# Patient Record
Sex: Female | Born: 1945
Health system: Southern US, Community
[De-identification: ages and names within clinical notes are randomized; demographics above are authoritative.]

## PROBLEM LIST (undated history)

## (undated) DIAGNOSIS — H3530 Unspecified macular degeneration: Secondary | ICD-10-CM

## (undated) DIAGNOSIS — I4891 Unspecified atrial fibrillation: Secondary | ICD-10-CM

## (undated) DIAGNOSIS — R011 Cardiac murmur, unspecified: Secondary | ICD-10-CM

## (undated) DIAGNOSIS — R591 Generalized enlarged lymph nodes: Secondary | ICD-10-CM

## (undated) DIAGNOSIS — C9110 Chronic lymphocytic leukemia of B-cell type not having achieved remission: Secondary | ICD-10-CM

## (undated) DIAGNOSIS — D72820 Lymphocytosis (symptomatic): Secondary | ICD-10-CM

## (undated) DIAGNOSIS — I10 Essential (primary) hypertension: Secondary | ICD-10-CM

## (undated) DIAGNOSIS — M1990 Unspecified osteoarthritis, unspecified site: Secondary | ICD-10-CM

## (undated) DIAGNOSIS — I499 Cardiac arrhythmia, unspecified: Secondary | ICD-10-CM

## (undated) DIAGNOSIS — Z9889 Other specified postprocedural states: Secondary | ICD-10-CM

## (undated) DIAGNOSIS — H35039 Hypertensive retinopathy, unspecified eye: Secondary | ICD-10-CM

## (undated) DIAGNOSIS — E785 Hyperlipidemia, unspecified: Secondary | ICD-10-CM

## (undated) DIAGNOSIS — R112 Nausea with vomiting, unspecified: Secondary | ICD-10-CM

## (undated) DIAGNOSIS — R599 Enlarged lymph nodes, unspecified: Secondary | ICD-10-CM

## (undated) HISTORY — DX: Generalized enlarged lymph nodes: R59.1

## (undated) HISTORY — DX: Hyperlipidemia, unspecified: E78.5

## (undated) HISTORY — DX: Enlarged lymph nodes, unspecified: R59.9

## (undated) HISTORY — PX: CHOLECYSTECTOMY: SHX55

## (undated) HISTORY — DX: Unspecified atrial fibrillation: I48.91

## (undated) HISTORY — PX: ABDOMINAL HYSTERECTOMY: SUR658

## (undated) HISTORY — PX: APPENDECTOMY: SHX54

## (undated) HISTORY — DX: Hypertensive retinopathy, unspecified eye: H35.039

## (undated) HISTORY — DX: Essential (primary) hypertension: I10

## (undated) HISTORY — PX: BREAST BIOPSY: SHX20

## (undated) HISTORY — PX: EYE SURGERY: SHX253

## (undated) HISTORY — DX: Unspecified macular degeneration: H35.30

## (undated) HISTORY — DX: Lymphocytosis (symptomatic): D72.820

## (undated) HISTORY — DX: Chronic lymphocytic leukemia of B-cell type not having achieved remission: C91.10

## (undated) HISTORY — PX: THUMB ARTHROSCOPY: SHX2509

---

## 1999-01-02 ENCOUNTER — Ambulatory Visit (HOSPITAL_COMMUNITY): Admission: RE | Admit: 1999-01-02 | Discharge: 1999-01-02 | Payer: Self-pay | Admitting: Gastroenterology

## 1999-03-14 ENCOUNTER — Other Ambulatory Visit: Admission: RE | Admit: 1999-03-14 | Discharge: 1999-03-14 | Payer: Self-pay | Admitting: Gynecology

## 2000-04-03 ENCOUNTER — Other Ambulatory Visit: Admission: RE | Admit: 2000-04-03 | Discharge: 2000-04-03 | Payer: Self-pay | Admitting: Gynecology

## 2000-07-10 ENCOUNTER — Encounter: Payer: Self-pay | Admitting: Family Medicine

## 2000-07-10 ENCOUNTER — Encounter: Admission: RE | Admit: 2000-07-10 | Discharge: 2000-07-10 | Payer: Self-pay | Admitting: Family Medicine

## 2001-05-18 ENCOUNTER — Other Ambulatory Visit: Admission: RE | Admit: 2001-05-18 | Discharge: 2001-05-18 | Payer: Self-pay | Admitting: Gynecology

## 2001-08-06 ENCOUNTER — Encounter: Payer: Self-pay | Admitting: Gynecology

## 2001-08-06 ENCOUNTER — Encounter: Admission: RE | Admit: 2001-08-06 | Discharge: 2001-08-06 | Payer: Self-pay | Admitting: Gynecology

## 2002-06-21 ENCOUNTER — Other Ambulatory Visit: Admission: RE | Admit: 2002-06-21 | Discharge: 2002-06-21 | Payer: Self-pay | Admitting: Gynecology

## 2002-09-02 ENCOUNTER — Encounter (INDEPENDENT_AMBULATORY_CARE_PROVIDER_SITE_OTHER): Payer: Self-pay | Admitting: *Deleted

## 2002-09-02 ENCOUNTER — Ambulatory Visit (HOSPITAL_COMMUNITY): Admission: RE | Admit: 2002-09-02 | Discharge: 2002-09-02 | Payer: Self-pay | Admitting: Gastroenterology

## 2003-05-31 ENCOUNTER — Encounter: Payer: Self-pay | Admitting: Orthopedic Surgery

## 2003-06-02 ENCOUNTER — Ambulatory Visit (HOSPITAL_COMMUNITY): Admission: RE | Admit: 2003-06-02 | Discharge: 2003-06-02 | Payer: Self-pay | Admitting: Orthopedic Surgery

## 2003-07-27 ENCOUNTER — Other Ambulatory Visit: Admission: RE | Admit: 2003-07-27 | Discharge: 2003-07-27 | Payer: Self-pay | Admitting: Gynecology

## 2003-07-27 ENCOUNTER — Encounter: Payer: Self-pay | Admitting: Gynecology

## 2003-07-27 ENCOUNTER — Encounter: Admission: RE | Admit: 2003-07-27 | Discharge: 2003-07-27 | Payer: Self-pay | Admitting: Gynecology

## 2004-08-02 ENCOUNTER — Ambulatory Visit (HOSPITAL_COMMUNITY): Admission: RE | Admit: 2004-08-02 | Discharge: 2004-08-02 | Payer: Self-pay | Admitting: Gynecology

## 2004-08-08 ENCOUNTER — Other Ambulatory Visit: Admission: RE | Admit: 2004-08-08 | Discharge: 2004-08-08 | Payer: Self-pay | Admitting: Gynecology

## 2005-09-04 ENCOUNTER — Ambulatory Visit (HOSPITAL_COMMUNITY): Admission: RE | Admit: 2005-09-04 | Discharge: 2005-09-04 | Payer: Self-pay | Admitting: Gynecology

## 2005-09-18 ENCOUNTER — Other Ambulatory Visit: Admission: RE | Admit: 2005-09-18 | Discharge: 2005-09-18 | Payer: Self-pay | Admitting: Gynecology

## 2006-10-09 ENCOUNTER — Ambulatory Visit (HOSPITAL_COMMUNITY): Admission: RE | Admit: 2006-10-09 | Discharge: 2006-10-09 | Payer: Self-pay | Admitting: Gynecology

## 2007-02-26 ENCOUNTER — Other Ambulatory Visit: Admission: RE | Admit: 2007-02-26 | Discharge: 2007-02-26 | Payer: Self-pay | Admitting: Gynecology

## 2007-06-04 ENCOUNTER — Encounter
Admission: RE | Admit: 2007-06-04 | Discharge: 2007-06-04 | Payer: Self-pay | Admitting: Physical Medicine and Rehabilitation

## 2007-10-01 ENCOUNTER — Ambulatory Visit (HOSPITAL_COMMUNITY): Admission: RE | Admit: 2007-10-01 | Discharge: 2007-10-01 | Payer: Self-pay | Admitting: Gastroenterology

## 2007-10-09 ENCOUNTER — Ambulatory Visit (HOSPITAL_COMMUNITY): Admission: RE | Admit: 2007-10-09 | Discharge: 2007-10-09 | Payer: Self-pay | Admitting: Gynecology

## 2008-04-28 ENCOUNTER — Ambulatory Visit (HOSPITAL_COMMUNITY): Admission: RE | Admit: 2008-04-28 | Discharge: 2008-04-28 | Payer: Self-pay | Admitting: Family Medicine

## 2008-09-21 ENCOUNTER — Encounter: Admission: RE | Admit: 2008-09-21 | Discharge: 2008-09-21 | Payer: Self-pay | Admitting: Orthopedic Surgery

## 2008-11-11 HISTORY — PX: TOTAL HIP ARTHROPLASTY: SHX124

## 2008-12-22 ENCOUNTER — Encounter: Admission: RE | Admit: 2008-12-22 | Discharge: 2008-12-22 | Payer: Self-pay | Admitting: Orthopedic Surgery

## 2009-01-01 ENCOUNTER — Ambulatory Visit: Payer: Self-pay | Admitting: *Deleted

## 2009-01-01 ENCOUNTER — Inpatient Hospital Stay (HOSPITAL_COMMUNITY): Admission: EM | Admit: 2009-01-01 | Discharge: 2009-01-02 | Payer: Self-pay | Admitting: Emergency Medicine

## 2009-01-02 ENCOUNTER — Encounter (INDEPENDENT_AMBULATORY_CARE_PROVIDER_SITE_OTHER): Payer: Self-pay | Admitting: Cardiology

## 2009-02-02 ENCOUNTER — Other Ambulatory Visit: Admission: RE | Admit: 2009-02-02 | Discharge: 2009-02-02 | Payer: Self-pay | Admitting: Family Medicine

## 2009-03-09 ENCOUNTER — Ambulatory Visit (HOSPITAL_COMMUNITY): Admission: RE | Admit: 2009-03-09 | Discharge: 2009-03-09 | Payer: Self-pay | Admitting: Family Medicine

## 2009-03-22 ENCOUNTER — Inpatient Hospital Stay (HOSPITAL_COMMUNITY): Admission: RE | Admit: 2009-03-22 | Discharge: 2009-03-25 | Payer: Self-pay | Admitting: Orthopedic Surgery

## 2010-04-03 ENCOUNTER — Ambulatory Visit (HOSPITAL_COMMUNITY): Admission: RE | Admit: 2010-04-03 | Discharge: 2010-04-03 | Payer: Self-pay | Admitting: Family Medicine

## 2011-02-19 LAB — CBC
HCT: 26.7 % — ABNORMAL LOW (ref 36.0–46.0)
HCT: 37.8 % (ref 36.0–46.0)
Hemoglobin: 9.1 g/dL — ABNORMAL LOW (ref 12.0–15.0)
Hemoglobin: 9.6 g/dL — ABNORMAL LOW (ref 12.0–15.0)
MCHC: 34.1 g/dL (ref 30.0–36.0)
MCHC: 35 g/dL (ref 30.0–36.0)
MCV: 83.3 fL (ref 78.0–100.0)
MCV: 84.3 fL (ref 78.0–100.0)
Platelets: 228 10*3/uL (ref 150–400)
Platelets: 230 10*3/uL (ref 150–400)
Platelets: 253 10*3/uL (ref 150–400)
RBC: 3.2 MIL/uL — ABNORMAL LOW (ref 3.87–5.11)
RBC: 3.4 MIL/uL — ABNORMAL LOW (ref 3.87–5.11)
RDW: 13.9 % (ref 11.5–15.5)
RDW: 14.2 % (ref 11.5–15.5)
WBC: 10.9 10*3/uL — ABNORMAL HIGH (ref 4.0–10.5)
WBC: 17.6 10*3/uL — ABNORMAL HIGH (ref 4.0–10.5)
WBC: 18.1 10*3/uL — ABNORMAL HIGH (ref 4.0–10.5)

## 2011-02-19 LAB — URINALYSIS, ROUTINE W REFLEX MICROSCOPIC
Bilirubin Urine: NEGATIVE
Glucose, UA: NEGATIVE mg/dL
Hgb urine dipstick: NEGATIVE
Specific Gravity, Urine: 1.007 (ref 1.005–1.030)
Urobilinogen, UA: 0.2 mg/dL (ref 0.0–1.0)

## 2011-02-19 LAB — BASIC METABOLIC PANEL
BUN: 8 mg/dL (ref 6–23)
CO2: 29 mEq/L (ref 19–32)
CO2: 30 mEq/L (ref 19–32)
Chloride: 101 mEq/L (ref 96–112)
GFR calc Af Amer: >60 mL/min (ref >60)
GFR calc non Af Amer: >60 mL/min (ref >60)
Glucose, Bld: 153 mg/dL — ABNORMAL HIGH (ref 70–99)
Potassium: 3.7 mEq/L (ref 3.5–5.1)
Potassium: 3.9 mEq/L (ref 3.5–5.1)
Sodium: 137 mEq/L (ref 135–145)
Sodium: 139 mEq/L (ref 135–145)

## 2011-02-19 LAB — ABO/RH: ABO/RH(D): O NEG

## 2011-02-19 LAB — COMPREHENSIVE METABOLIC PANEL
AST: 24 U/L (ref 0–37)
GFR calc Af Amer: >60 mL/min (ref >60)
Potassium: 3.9 mEq/L (ref 3.5–5.1)
Total Protein: 6.8 g/dL (ref 6.0–8.3)

## 2011-02-19 LAB — PROTIME-INR
INR: 0.9 (ref 0.00–1.49)
INR: 1 (ref 0.00–1.49)
INR: 1.4 (ref 0.00–1.49)

## 2011-02-19 LAB — TYPE AND SCREEN
ABO/RH(D): O NEG
Antibody Screen: NEGATIVE

## 2011-02-26 LAB — LIPID PANEL
HDL: 45 mg/dL (ref >39)
LDL Cholesterol: 58 mg/dL (ref 0–99)
Total CHOL/HDL Ratio: 3.2 RATIO
Triglycerides: 201 mg/dL — ABNORMAL HIGH (ref <150)
VLDL: 40 mg/dL (ref 0–40)

## 2011-02-26 LAB — POCT I-STAT, CHEM 8
BUN: 23 mg/dL (ref 6–23)
Chloride: 104 mEq/L (ref 96–112)
Creatinine, Ser: 1 mg/dL (ref 0.4–1.2)
Glucose, Bld: 104 mg/dL — ABNORMAL HIGH (ref 70–99)
Potassium: 3.2 mEq/L — ABNORMAL LOW (ref 3.5–5.1)
Sodium: 143 mEq/L (ref 135–145)

## 2011-02-26 LAB — CBC
MCHC: 35.1 g/dL (ref 30.0–36.0)
MCV: 83.8 fL (ref 78.0–100.0)
RBC: 4.58 MIL/uL (ref 3.87–5.11)
RDW: 13.7 % (ref 11.5–15.5)
WBC: 15.9 10*3/uL — ABNORMAL HIGH (ref 4.0–10.5)

## 2011-02-26 LAB — COMPREHENSIVE METABOLIC PANEL
Alkaline Phosphatase: 60 U/L (ref 39–117)
BUN: 20 mg/dL (ref 6–23)
Glucose, Bld: 108 mg/dL — ABNORMAL HIGH (ref 70–99)
Potassium: 3.5 mEq/L (ref 3.5–5.1)
Total Protein: 6.3 g/dL (ref 6.0–8.3)

## 2011-02-26 LAB — DIFFERENTIAL
Basophils Absolute: 0 10*3/uL (ref 0.0–0.1)
Eosinophils Absolute: 0.2 10*3/uL (ref 0.0–0.7)
Eosinophils Relative: 1 % (ref 0–5)
Monocytes Absolute: 1 10*3/uL (ref 0.1–1.0)
Neutrophils Relative %: 43 % (ref 43–77)

## 2011-02-26 LAB — HEMOGLOBIN A1C
Hgb A1c MFr Bld: 5.5 % (ref 4.6–6.1)
Mean Plasma Glucose: 111 mg/dL

## 2011-02-26 LAB — POCT CARDIAC MARKERS
CKMB, poc: <1 ng/mL — ABNORMAL LOW (ref 1.0–8.0)
Myoglobin, poc: 51.4 ng/mL (ref 12–200)
Troponin i, poc: <0.05 ng/mL (ref 0.00–0.09)

## 2011-02-26 LAB — PROTIME-INR: INR: 0.9 (ref 0.00–1.49)

## 2011-03-15 ENCOUNTER — Other Ambulatory Visit (HOSPITAL_COMMUNITY): Payer: Self-pay | Admitting: Family Medicine

## 2011-03-15 DIAGNOSIS — Z1231 Encounter for screening mammogram for malignant neoplasm of breast: Secondary | ICD-10-CM

## 2011-03-26 NOTE — Op Note (Signed)
NAME:  Amanda [name], Amanda [name]               ACCOUNT NO.:  [id number]   MEDICAL RECORD NO.:  [id number]          PATIENT TYPE:  AMB   LOCATION:  ENDO                         FACILITY:  [facility]   PHYSICIAN:  Bernette Redbird, M.D.   DATE OF BIRTH:  01-30-1946   DATE OF PROCEDURE:  10/01/2007  DATE OF DISCHARGE:                               OPERATIVE REPORT   PROCEDURE:  Colonoscopy.   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  In addition, there is a family history of colon cancer in both parents.   FINDINGS:  Normal exam.   PROCEDURE:  The nature, purpose and risks of the procedure were familiar  to the patient from prior examinations and she provided written consent.   Because of the history of nausea and vomiting with opiates, we minimized  use of fentanyl and gave Zofran 4 mg IV as premedication.  She ended up  with fentanyl 65 mcg and Versed 9 mg IV, suggesting that she might do  better with propofol in the future.   The Pentax adult video colonoscope was advanced with moderate looping to  the cecum, using external abdominal compression and turning the patient  into the supine position to facilitate advancement into the base of the  cecum which was identified by visualization of the appendiceal orifice.  Pullback was then performed.  The quality of the prep was excellent and  it is felt that all areas were well seen.   This was a normal examination.  No polyps were seen and there was no  evidence of cancer, colitis, vascular malformations or diverticulosis.  Retroflexion in the rectum and reinspection of the rectum were  unremarkable.  The patient tolerated the procedure well and there were  no apparent complications.  No biopsies were obtained.   IMPRESSION:  Normal surveillance colonoscopy in a patient with  significant risk factors, characterized by family history of colon  cancer in both parents and prior history of colonic adenomas.   PLAN:  Repeat colonoscopy in five years.           ______________________________  Bernette Redbird, M.D.     RB/MEDQ  D:  10/01/2007  T:  10/01/2007  Job:  [id number]   cc:   Pam Drown, M.D.  Fax: [phone]

## 2011-03-26 NOTE — Discharge Summary (Signed)
NAME:  Amanda [name], Amanda [name] NO.:  [id number]   MEDICAL RECORD NO.:  [id number]          PATIENT TYPE:  INP   LOCATION:  2019                         FACILITY:  [facility]   PHYSICIAN:  Georga Hacking, M.D.DATE OF BIRTH:  17-Sep-1946   DATE OF ADMISSION:  01/01/2009  DATE OF DISCHARGE:  01/02/2009                               DISCHARGE SUMMARY   FINAL DIAGNOSES:  1. Supraventricular tachycardia and atrial fibrillation - resolved.  2. Hypertensive heart disease.  3. Hyperlipidemia.  4. Osteoarthritis.   PROCEDURES:  Echocardiogram.   HISTORY:  This is a 65 year old female with a previous history of  hypertension and previous diagnosis of SVT.  She previously was treated  with digoxin and calcium channel blockers, but these were stopped by her  primary physician for unclear reasons years ago.  She has been treated  with atenolol.  In November, she had a few hours of intermittent  palpitations that went away with lying down.  She was out with her  husband the evening of admission, developed flushing, weakness, and a  sense that she might pass out.  She was found to have irregular  supraventricular tachycardia given intravenous metoprolol and was  admitted.  Please see the previously dictated history and physical for  remainder of the details.   HOSPITAL COURSE:  The patient was admitted for treatment of SVT and  treated initially with IV metoprolol.  She was placed on p.o. metoprolol  and her symptoms resolved.  She had no arrhythmias since admission.  CPK-  MB was negative and troponin was negative on admission.   Her lab data showed a hemoglobin of 13.5, hematocrit 38.4.  PT and PTT  were normal.  Chemistry panel showed a glucose of 108, potassium 3.5,  sodium 143, chloride 111, CO2 of 28.  Liver enzymes were normal.  Cardiac enzymes initially were negative.  Cholesterol 143, triglycerides  201, HDL 45, LDL 58, ratio of 3.2.  TSH was 3.0.   Chest x-ray was not  performed on admission.  EKG initially showed some  rapid irregular tachycardia.  Following this, she was in sinus rhythm  with minimal ST changes.   She was hospitalized and was observed on telemetry.  An echocardiogram  showed concentric LVH with mild mitral regurgitation, normal systolic  function, mild left atrial enlargement.  She clinically was improved and  was discharged home later on the afternoon of the next day.   DISCHARGE MEDICATIONS:  1. Atenolol 50 mg twice daily until she runs out of it and then is to      switch to metoprolol 50 mg twice daily.  2. Lanoxin 0.25 mg daily.  3. Lipitor 40 mg daily.  4. Hydrochlorothiazide 12.5 mg daily.  5. Multivitamins daily.  6. Calcium with vitamin D b.i.d.  7. Folic acid daily.  8. She is to increase her aspirin to 325 mg daily.  9. Pepcid daily as needed.  10.Tylenol Arthritis as needed.  11.She is also to take Klor-Con 20 mEq daily for potassium.   She is to follow up in 1 week in my office.  Georga Hacking, M.D.  Electronically Signed     WST/MEDQ  D:  01/02/2009  T:  01/03/2009  Job:  [id number]   cc:   Pam Drown, M.D.

## 2011-03-26 NOTE — H&P (Signed)
NAME:  Amanda [name], Amanda [name] NO.:  [id number]   MEDICAL RECORD NO.:  [id number]          PATIENT TYPE:  INP   LOCATION:  1829                         FACILITY:  [facility]   PHYSICIAN:  Georga Hacking, M.D.DATE OF BIRTH:  02-10-1946   DATE OF ADMISSION:  01/01/2009  DATE OF DISCHARGE:                              HISTORY & PHYSICAL   CHIEF COMPLAINT:  Palpitations and weakness.   HISTORY OF PRESENT ILLNESS:  Amanda [name] is a 65 year old woman with past  medical history significant for SVT and hypertension who presents with  acute onset of feeling flushed and weak with palpitations this evening.  She reports that a number of years ago, she had similar symptoms and was  diagnosed with SVT.  She was treated for some time with digoxin and  calcium channel blockers, but after a long period without recurrence,  these were stopped.  She reports that since then she has had no  palpitations at all until November when she had a few hours of  intermittent palpitations for 1 day that went away with lying down.   She was out with her husband this evening and developed a feeling of  flushing and weakness and a sense that she might pass out.  She sat down  and continued to have palpitations and malaise and so called 911.  She  denies any chest pain, shortness of breath, syncope, edema, orthopnea,  PND, or other cardiac symptoms.  She denies any neurologic symptoms  suggestive of TIA or stroke.   In the emergency department, she was found to have irregular  supraventricular tachycardia with occasional runs of wide-complex  tachycardia and elevated heart rates.  She received IV metoprolol x2  with some improvement in her heart rate.   REVIEW OF SYSTEMS:  As above, otherwise negative in detail.  She denies  any fevers, chills, or infectious symptoms.  She does have chronic  arthritis and is awaiting orthopedic surgery of her lower extremities.   PAST MEDICAL HISTORY:  1. History of  SVT as above.  2. Hypertension.  3. Hyperlipidemia.  4. Osteoarthritis.  5. Status post cholecystectomy.  6. Status post carpal tunnel release bilaterally.  7. Status post hysterectomy.   SOCIAL HISTORY:  The patient lives with her husband.  She is a retired  Engineer, civil (consulting).  She is the mother of 2 children.  She denies tobacco,  significant alcohol use, or drug abuse.   FAMILY HISTORY:  Noncontributory.   ALLERGIES:  DEMEROL, MORPHINE, CODEINE, and SULFA.   MEDICATIONS:  1. Atenolol 50 mg daily.  2. Lipitor 40 mg daily.  3. Hydrochlorothiazide 12.5 mg daily.  4. Multivitamin daily.  5. Aspirin 81 mg daily.  6. Folic acid daily.  7. Calcium plus vitamin D daily.   PHYSICAL EXAMINATION:  VITAL SIGNS:  Temperature 98.4, pulse 120,  respiratory rate 18, blood pressure 129/52, oxygen saturation 100% on 2  L.  GENERAL:  She is a healthy-appearing white woman, in no acute distress.  HEENT:  Extraocular movements intact.  Pupils are equal, round, and  reactive.  Mucous membranes are moist.  NECK:  Supple without lymphadenopathy or thyromegaly.  Carotids are 2+  bilaterally without bruits.  Jugular venous pressure is normal at 30  degrees.  LUNGS:  Clear to auscultation bilaterally with the exception of a few  crackles at the left base that cleared with cough.  CARDIOVASCULAR:  Irregularly irregular with normal S1 and S2.  There is  a soft systolic murmur at the lower sternal border.  Peripheral pulses  are 2+ and symmetric.  ABDOMEN:  Soft, nontender, nondistended.  Positive bowel sounds and no  rebound or guarding.  EXTREMITIES:  Without clubbing, cyanosis, or edema.  MUSCULOSKELETAL:  Unremarkable.  NEUROLOGIC:  Grossly unremarkable.   EKG shows a sinus rhythm at 79 beats per minute with normal axis and  intervals.  There is a PAC with compensatory pause.  On telemetry, she  has frequent PACs, frequent runs of irregular SVT that is consistent  with atrial fibrillation with short  pauses with reversion to sinus  rhythm.  There are runs of wide-complex quite regular tachycardia and  reversed with her atrial fibrillation that are likely AFib with  aberrancy though nonsustained VT is also possible.   Labs notable for normal CBC.  Basic metabolic panel shows a creatinine  of 1.0, potassium is low at 3.2.  Cardiac enzymes are negative x1.   ASSESSMENT AND PLAN:  Ms. [name] is a pleasant 65 year old woman with a  remote history of supraventricular tachycardia, now presents with  paroxysmal atrial fibrillation.  She is quite symptomatic from this.  At  this time, we will treat her with nodal agents to attempt to control her  rate.  We will increase her aspirin to 325 mg daily.  We will continue  her on a beta-blocker and add a diltiazem drip this time.  We will admit  her for further medication titration and observation.  If she remains  symptomatic despite nodal agents, then consideration of an  antiarrhythmic, such as dofetilide will be reasonable.      Adela Ports, MD   Electronically Signed     ______________________________  Lacretia Nicks. Viann Fish, M.D.   DWM/MEDQ  D:  01/01/2009  T:  01/02/2009  Job:  [id number]

## 2011-03-26 NOTE — H&P (Signed)
NAME:  Amanda [name], FASO NO.:  [id number]   MEDICAL RECORD NO.:  [id number]          PATIENT TYPE:  INP   LOCATION:                               FACILITY:  [facility]   PHYSICIAN:  Ollen Gross, M.D.    DATE OF BIRTH:  01-May-1946   DATE OF ADMISSION:  03/22/2009  DATE OF DISCHARGE:                              HISTORY & PHYSICAL   CHIEF COMPLAINT:  Right hip pain.   HISTORY OF PRESENT ILLNESS:  The patient is a 65 year old female, well-  known to Dr. Homero Fellers Aluisio.  She is a retired Designer, industrial/product, used to  work at [employer].  Unfortunately she has had progressive  pain with her hips, the right hip is more problematic than the left.  She has been seen in the office, found to have virtually bone-on-bone in  the right hip with cystic changes, sclerotic changes noted.  She has  been treated conservatively in the past but despite that she continues  to have pain.  Now would benefit from undergoing hip replacement.   ALLERGIES:  NO KNOWN DRUG ALLERGIES.   INTOLERANCES:  MORPHINE CAUSES SICKNESS; SYNTHETIC CODEINES INCLUDING  OXYCODONE, HYDROCODONE CAUSE SICKNESS; DEMEROL CAUSES SICKNESS AND ALSO  SULFA.   CURRENT MEDICATIONS:  Boniva, metoprolol, Lipitor, digoxin,  hydrochlorothiazide, potassium, Flonase, aspirin, calcium plus D, folic  acid, DHA, multivitamin, tramadol, Tylenol Arthritis, naproxen and  Pepcid.   PAST MEDICAL HISTORY:  1. Cataracts.  2. Hypertension.  3. Childhood cardiac murmur.  4. Hypercholesterolemia.  5. Past history of supraventricular tachycardia.  6. Past history of paroxysmal atrial fibrillation.  7. Osteopenia.  8. Degenerative disk disease.   PAST SURGICAL HISTORY:  1. Gallbladder surgery.  2. Hysterectomy.  3. Left thumb surgery.  4. Right carpal tunnel surgery.   She does have nausea and vomiting with anesthesia.   FAMILY HISTORY:  Father deceased at age 14 with cancer of the bowel.  Mother 77, hypertension,  diabetes.  Sister is 77 with arthritis, her  brother is 47 with arthritis.   SOCIAL HISTORY:  Married, retired Astronomer.  Past smoker, one to two drinks  of alcohol per week.  Daughter will be assisting with care after  surgery.  She has five steps entering her home.  Does have a living  will, healthcare power of attorney.   REVIEW OF SYSTEMS:  GENERAL:  No fevers, chills, night sweats.  NEURO:  No seizures, syncope or paralysis.  RESPIRATORY:  No shortness of  breath, productive cough or hemoptysis.  CARDIOVASCULAR:  No chest pain,  orthopnea.  GI:  No nausea, vomiting, diarrhea or constipation.  GU: No  dysuria, hematuria or discharge.  MUSCULOSKELETAL:  Right hip.   PHYSICAL EXAMINATION:  VITAL SIGNS:  Pulse 64, respirations 12, blood  pressure 122/68.  GENERAL:  A 63-year white female well-nourished, well-developed, no  acute distress.  She is alert, oriented and cooperative, excellent  historian.  HEENT:  Normocephalic/atraumatic.  Pupils are round and reactive.  Oropharynx clear.  EOMs intact.  Noted to wear glasses.  NECK:  Supple.  CHEST:  Clear anterior posterior  chest wall.  HEART:  Regular rate and rhythm.  No murmur, S1 - S2 noted.  ABDOMEN:  Soft, nontender.  Bowel sounds present.  Rectal, breasts, genitalia not done, not pertinent to present illness.  EXTREMITIES:  Right hip flexion 90, zero internal rotation, 5 degrees  external rotation, 10 degrees abduction.   IMPRESSION:  Osteoarthritis right hip.   PLAN:  The patient admitted to [facility] to undergo a right  total hip replacement arthroplasty.  Surgery will be performed by Dr.  Ollen Gross.      Alexzandrew L. Perkins, P.A.C.      Ollen Gross, M.D.  Electronically Signed    ALP/MEDQ  D:  03/23/2009  T:  03/23/2009  Job:  [id number]   cc:   Pam Drown, M.D.  Fax: [phone]   W. Viann Fish, M.D.  Fax: [phone]  Email: [email or web address]

## 2011-03-26 NOTE — Op Note (Signed)
NAME:  Amanda [name], Amanda [name] NO.:  [id number]   MEDICAL RECORD NO.:  [id number]          PATIENT TYPE:  INP   LOCATION:  1603                         [facility]ACILITY:  [facility]   PHYSICIAN:  Ollen Gross, M.D.    DATE OF BIRTH:  03-Jun-1946   DATE OF PROCEDURE:  03/22/2009  DATE OF DISCHARGE:                               OPERATIVE REPORT   PREOPERATIVE DIAGNOSIS:  Osteoarthritis right hip.   POSTOPERATIVE DIAGNOSIS:  Osteoarthritis right hip.   PROCEDURE:  Right total hip arthroplasty.   SURGEON:  F. Aluisio, M.D.   ASSISTANT:  Avel Peace PA-C   ANESTHESIA:  General.   ESTIMATED BLOOD LOSS:  400 mL.   DRAIN:  Hemovac times one.   COMPLICATIONS:  None.   CONDITION:  Stable to recovery.   BRIEF CLINICAL NOTE:  Amanda [name] is a 65 year old female who has developed  severe end-stage arthritis of the right hip with progressively worsening  pain and dysfunction.  She has failed nonoperative management and  presents for total hip arthroplasty.   PROCEDURE IN DETAIL:  After successful administration of general  anesthetic, the patient is placed in left lateral decubitus position  with the right side up and held with the hip positioner.  Th right lower  extremity is isolated from her perineum with plastic drapes and prepped  and draped in the usual sterile fashion.  A short posterolateral  incision is made with 10 blade through subcutaneous tissue to the level  of the fascia lata which is incised in line with the skin incision.  The  sciatic nerve is palpated and protected and a short rotator is isolated  off the femur.  Capsulotomy is performed and the capsule retracted.  The  hip is then dislocated and the center of the femoral head is marked.  A  trial prosthesis is placed such that the center of the trial head  corresponds to the center of her native femoral head.  An osteotomy line  is marked on the femoral neck and osteotomy made with an oscillating  saw.  The femoral  head is then removed and the femur retracted  anteriorly to gain acetabular exposure.   Acetabular retractors were placed and labrum and osteophytes were  removed.  Reaming starts at 45 mm in coursing increments of 2 up to 51  mm and then a 52-mm pinnacle acetabular shell was placed in anatomic  position with outstanding purchase.  We did not place any additional  provisional screws.  The apex hole eliminator is placed and the  permanent 36 mm neutral Ultramet liner is placed.   The femur is prepared with the canal finder and irrigation.  Axial  reaming is performed at 13.5 mm proximal reaming to an 18 F and the  sleeve machine to a large.  An 40 F large trial sleeve is placed and a  18 x 13 stem and 36 + 8 neck matching native anteversion.  A 36 x 0 head  is placed.  The hip reduces easily with a 36 + 3 which had more  appropriate soft tissue tension.  There is  outstanding stability with  full extension, full external rotation, 70 degrees of flexion, 40  degrees of adduction, 90 degrees of internal rotation and 90 degrees of  flexion, and 70 degrees of internal rotation.  By placing the right leg  on top of the left, it felt as though the leg lengths were equal.  The  hip is then dislocated and all trials removed.  The permanent 55 F large  sleeve is placed at the 18 x 13 stem and 36 + 8 neck matching native  anteversion.  A 32 + 3 head is placed and the hips reduce with the same  stability parameters.  The wound is copiously irrigated and then the  short rotators and capsule reattached to the femur through drill holes.  The fascia lata was closed over a Hemovac drain with interrupted #1  Vicryl, subcu closed with #1-0 and #2-0 Vicryl and subcuticular running  with 4-0 Monocryl.  The incision is then cleaned and dried and Steri-  Strips and a bulky sterile dressing are applied.  She is then placed  into a knee immobilizer, awakened and transported to recovery in stable   condition.      Ollen Gross, M.D.  Electronically Signed     FA/MEDQ  D:  03/22/2009  T:  03/22/2009  Job:  [id number]

## 2011-03-29 NOTE — Discharge Summary (Signed)
NAME:  [name], [name] NO.:  [id number]   MEDICAL RECORD NO.:  [id number]          PATIENT TYPE:  INP   LOCATION:  1603                         FACILITY:  [facility]   PHYSICIAN:  Ollen Gross, M.D.    DATE OF BIRTH:  1946/01/03   DATE OF ADMISSION:  03/22/2009  DATE OF DISCHARGE:  03/25/2009                               DISCHARGE SUMMARY   ADMITTING DIAGNOSES:  1. Osteoarthritis, right hip.  2. Cataracts.  3. Hypertension.  4. Childhood cardiac murmur.  5. Hypercholesterolemia.  6. Past history of supraventricular tachycardia.  7. Past history of paroxysmal atrial fibrillation.  8. Osteopenia.  9. Degenerative disk disease.   DISCHARGE DIAGNOSES:  1. Osteoarthritis, right hip, status post right total hip replacement      arthroplasty.  2. Postoperative acute blood loss anemia, did not require transfusion.  3. Cataracts.  4. Hypertension.  5. Childhood cardiac murmur.  6. Hypercholesterolemia.  7. Past history of supraventricular tachycardia.  8. Past history of paroxysmal atrial fibrillation.  9. Osteopenia.  10.Degenerative disk disease.   PROCEDURE:  Mar 22, 2009, right total hip.  Surgeon Dr. Lequita Halt.  Assistant Avel Peace P.A.-C.  Anesthesia general.   CONSULTS:  None.   BRIEF HISTORY:  Amanda [name] is a 65 year old female who has developed end-  stage arthritis of the right hip with progressive worsening pain and  dysfunction, who failed nonoperative management and now presents for  total hip arthroplasty.   LABORATORY DATA:  Preoperative CBC showed a hemoglobin 13.2, hematocrit  37.8, white cell count 10.9, platelets 253.  Postoperative hemoglobin  9.7 and 9.6.  Last noted H and H 9.1 and 26.7.  PT/PTT preoperative 12.7  and 26, respectively.  INR 0.9.  Serial pro times followed per Coumadin  protocol.  Last noted PT/INR 31.1 and 2.8.  Chem panel on admission all  within normal limits.  Electrolytes remained within normal limits.  Preoperative UA  was negative.  Blood group type O negative.   EKG Mar 15, 2009:  Normal sinus rhythm, low-voltage QRS, septal infarct  age undetermined, confirmed by Dr. Eldridge Dace.  X-rays, right hip film Mar 15, 2009:  End-stage right hip arthritis with flattening of the articular  surface of the femoral head and subchondral geode formation.  Two-view  chest Mar 15, 2009:  No active cardiopulmonary process, thoracolumbar  scoliosis.   HOSPITAL COURSE:  The patient admitted to [facility] taken to  the OR, underwent the above-stated procedure without complication.  The  patient tolerated the procedure well and later transferred to the  recovery room and the orthopedic floor.  Started on PCA and p.o.  analgesics for pain control following surgery.  He had decent night  following surgery, doing pretty well on the morning of day #1.  History  of SVT and atrial fibrillation.  She was started back on her beta-  blocker.  Had excellent urinary output on day #1.  Hemoglobin looked  good at 9.7.  We did start her on a little bit of iron since it was  lower from preoperatively.  Started getting up out  of bed, partial  weightbearing, walked about 30 feet, did very well on day #1.  By day  #2, she was progressing up to 60 feet.  Hemoglobin was stable.  Electrolytes looked good.  Dressing changed and incision looked good.  Felt that she was progressing well, possibly ready by the next day.  She  was seen on rounds on postoperative day #3.  Hemoglobin was a little  lower at 9.1, but she was asymptomatic with this.  She ran a little bit  of a temperature that evening early morning, but she was afebrile on  rounds.  Did not complain of any chest congestion or dysuria.  She was  meeting her goals, progressing with therapy and wanted to go home.   DISCHARGE/PLAN:  1. The patient was discharged home on home on Mar 25, 2009.  2. Discharge diagnoses:  Please see above.  3. Discharge medications:  Coumadin,  Vicodin, Robaxin, iron.   DIET:  As tolerated, cardiac diet.   ACTIVITY:  She is partial weightbearing the right lower extremity 25-  50%.  Hip precautions, total hip protocol.  She may start showering;  however, do not submerge the incision under water.   FOLLOW-UP:  2 weeks.   DISPOSITION:  Home.   CONDITION ON DISCHARGE:  Improved.      Alexzandrew L. Perkins, P.A.C.      Ollen Gross, M.D.  Electronically Signed    ALP/MEDQ  D:  04/11/2009  T:  04/11/2009  Job:  [id number]   cc:   Ollen Gross, M.D.  Fax: [phone]   Pam Drown, M.D.  Fax: [phone]   W. Viann Fish, M.D.  Fax: [phone]  Email: [email or web address]

## 2011-03-29 NOTE — Op Note (Signed)
NAME:  Amanda [name], Amanda [name]                         ACCOUNT NO.:  [id number]   MEDICAL RECORD NO.:  [id number]                   PATIENT TYPE:  AMB   LOCATION:  DAY                                  FACILITY:  [facility]   PHYSICIAN:  Dionne Ano. Everlene Other, M.D.         DATE OF BIRTH:  22-Apr-1946   DATE OF PROCEDURE:  06/02/2003  DATE OF DISCHARGE:                                 OPERATIVE REPORT   PREOPERATIVE DIAGNOSIS:  Right carpal tunnel syndrome.   POSTOPERATIVE DIAGNOSIS:  Right carpal tunnel syndrome.   PROCEDURE:  1. Right median nerve field block at the wrist for anesthetic purposes for     carpal tunnel release.  2. Right limb for carpal tunnel release.   SURGEON:  Dionne Ano. Amanda Pea, M.D.   ASSISTANTOttie Glazier. Wynona Neat, P.A.-C.   COMPLICATIONS:  None.   ANESTHESIA:  Local with IV sedation (the patient was kept awake, alert, and  oriented the entire case).   TOURNIQUET TIME:  Five minutes.   ESTIMATED BLOOD LOSS:  Minimal.   INDICATIONS FOR PROCEDURE:  This patient is a very pleasant, 65 year old  female, who presents with the above-mentioned diagnoses.  I have counseled  her in regards to the risks and benefits of surgery, including risk of  infection, bleeding, anesthesia, damage to normal structures, and failure of  the surgery to accomplish its intended goals of relieving symptoms and  restoring normal function.  With this in mind, she desires to proceed.  All  questions have been encouraged and answered preoperatively.   OPERATIVE FINDINGS:  The patient had a thickened transverse carpal ligament  separated widely across incision.  There were no __________ lesions in the  canal; however, this was a limited open approach, and deep canal inspection  was not carried out.  She tolerated the procedure well.  She was awake,  alert, and oriented and had excellent release without iatrogenic median  nerve injury.   OPERATION IN DETAIL:  The patient was seen by myself and  anesthesia, taken  to the operative suite, underwent a local median nerve and field block at  the wrist for anesthetic purposes for carpal tunnel release.  She was given  1 of Versed and fentanyl prior to this and was kept awake during the  process.  She tolerated this well.  Following thorough prep and drape with  Betadine scrub and paint, she had the arm elevated.  Tourniquet was  insufflated to 250 mmHg, and a 1 cm incision was made through the transverse  carpal ligament.  Dissection was carried down through the skin with knife  blade.  Palmar fascia was incised.  Distal edge of the transverse carpal  ligament was identified and released under direct 4.0 loupe magnification.  Fat pad egressed nicely.  There was no aberrant median nerve branches, and  distal to proximal dissection was carried out until adequate room was  available for canal preparatory devices.  Canal preparatory devices 1, 2,  and 3 were placed directly into the proximal leading leaflet of the  transverse carpal ligament.  There was no friction, problems, or impedence  to passage.  I then placed a Security clip, obturator disengaged, and a  Security knife was placed and the Security clip effectively releasing the  proximal leaflet of transverse carpal ligament.  The patient tolerated this  well.  She was fully released without any problems whatsoever.  I checked  the area, deflated the tourniquet, irrigated copiously, obtained hemostasis,  and closed the wound with interrupted Prolene.  She tolerated this well.  Sterile dressing was applied without difficulty and following this, she was  taken to the recovery room where she will be monitored and then discharged  home.  I have discussed with her elevation, dispensed Darvocet and Robaxin  for postoperative analgesia and spasm relief and will see her in seven days  to place a Band-Aid over her wound and then check carpal tunnel protocol.  It has been a pleasure  participating in her care, and we look forward to  participating in postop recovery.                                               Dionne Ano. Everlene Other, M.D.    Nash Mantis  D:  06/02/2003  T:  06/02/2003  Job:  [id number]

## 2011-03-29 NOTE — Op Note (Signed)
NAME:  Amanda [name], Amanda [name]                         ACCOUNT NO.:  [id number]   MEDICAL RECORD NO.:  [id number]                   PATIENT TYPE:  AMB   LOCATION:  ENDO                                 FACILITY:  [facility]   PHYSICIAN:  Bernette Redbird, MD                  DATE OF BIRTH:  [birth year]   DATE OF PROCEDURE:  09/02/2002  DATE OF DISCHARGE:                                 OPERATIVE REPORT   PROCEDURE:  Colonoscopy with biopsies.   INDICATION:  A 65 year old for follow up colonic adenoma removed three years  ago.   FINDINGS:  Diminutive sessile polyp, removed.   DESCRIPTION OF PROCEDURE:  The nature, purpose, and risks of the procedure  were familiar to the patient from prior examinations.  She provided written  consent.   Because of a history of nausea and vomiting following previous narcotic  analgesia or sedation, we used the same type of medications as she had  previously received for her previous colonoscopy which worked well and which  were not associated with an nausea or vomiting problem.  We gave the patient  Zofran 4 mg IV as premedication, then fentanyl 50 mcg and Versed 10 mg IV  without arrhythmias or desaturation and with an acceptable level of  sedation.   Based on prior experience, it was known that this patient has a loopy  colon, so we used the adult colonoscope, and this was able to be advanced to  the cecum without too much difficulty, just some external abdominal  compression in the proximal colon.  The cecum was identified by  visualization of the appendiceal orifice, and pullback was then performed.  The quality of the prep was excellent, and it is felt that all areas were  well-seen.   There was a tiny 2 mm sessile polyp at about 45 cm, removed by a couple of  cold biopsies.  No other polyps were seen, and there was no evidence of  cancer, colitis, vascular malformations, or diverticulosis.  Retroflexion in  the rectum was attempted but could not be  accomplished; antegrade view and  __________ reinspection.  The rectosigmoid was otherwise unrevealing.  The  patient tolerated the procedure well, and there were no apparent  complications.   IMPRESSION:  Solitary diminutive sessile polyp, removed as described above,  and the patient with a prior history of a medium sized colonic adenoma.    PLAN:  Await pathology.  Anticipate colonoscopic follow-up in five years  because of the prior history of a colonic adenoma.                                               Bernette Redbird, MD    RB/MEDQ  D:  09/02/2002  T:  09/02/2002  Job:  [id number]  cc:   Raynelle Dick, M.D.  [address]  [place]  [place] [address]  Fax: [phone]

## 2011-04-09 ENCOUNTER — Ambulatory Visit (HOSPITAL_COMMUNITY)
Admission: RE | Admit: 2011-04-09 | Discharge: 2011-04-09 | Disposition: A | Discharge status: Home / Self Care | Payer: Medicare Other | Source: Ambulatory Visit | Attending: Family Medicine | Admitting: Family Medicine

## 2011-04-09 DIAGNOSIS — Z1231 Encounter for screening mammogram for malignant neoplasm of breast: Secondary | ICD-10-CM

## 2011-10-21 ENCOUNTER — Other Ambulatory Visit: Payer: Self-pay | Admitting: Cardiology

## 2011-11-15 DIAGNOSIS — Z111 Encounter for screening for respiratory tuberculosis: Secondary | ICD-10-CM | POA: Diagnosis not present

## 2012-04-13 ENCOUNTER — Other Ambulatory Visit (HOSPITAL_COMMUNITY): Payer: Self-pay | Admitting: Family Medicine

## 2012-04-13 DIAGNOSIS — Z1231 Encounter for screening mammogram for malignant neoplasm of breast: Secondary | ICD-10-CM

## 2012-04-21 DIAGNOSIS — I10 Essential (primary) hypertension: Secondary | ICD-10-CM | POA: Diagnosis not present

## 2012-04-21 DIAGNOSIS — Z79899 Other long term (current) drug therapy: Secondary | ICD-10-CM | POA: Diagnosis not present

## 2012-04-21 DIAGNOSIS — E782 Mixed hyperlipidemia: Secondary | ICD-10-CM | POA: Diagnosis not present

## 2012-04-21 DIAGNOSIS — I4891 Unspecified atrial fibrillation: Secondary | ICD-10-CM | POA: Diagnosis not present

## 2012-05-06 ENCOUNTER — Ambulatory Visit (HOSPITAL_COMMUNITY)
Admission: RE | Admit: 2012-05-06 | Discharge: 2012-05-06 | Disposition: A | Discharge status: Home / Self Care | Payer: Medicare Other | Source: Ambulatory Visit | Attending: Family Medicine | Admitting: Family Medicine

## 2012-05-06 DIAGNOSIS — Z1231 Encounter for screening mammogram for malignant neoplasm of breast: Secondary | ICD-10-CM | POA: Diagnosis not present

## 2012-07-08 DIAGNOSIS — H1045 Other chronic allergic conjunctivitis: Secondary | ICD-10-CM | POA: Diagnosis not present

## 2012-07-08 DIAGNOSIS — H2510 Age-related nuclear cataract, unspecified eye: Secondary | ICD-10-CM | POA: Diagnosis not present

## 2012-07-08 DIAGNOSIS — H25019 Cortical age-related cataract, unspecified eye: Secondary | ICD-10-CM | POA: Diagnosis not present

## 2012-07-18 DIAGNOSIS — N390 Urinary tract infection, site not specified: Secondary | ICD-10-CM | POA: Diagnosis not present

## 2012-08-26 ENCOUNTER — Other Ambulatory Visit: Payer: Self-pay | Admitting: Dermatology

## 2012-08-26 DIAGNOSIS — L821 Other seborrheic keratosis: Secondary | ICD-10-CM | POA: Diagnosis not present

## 2012-08-26 DIAGNOSIS — L570 Actinic keratosis: Secondary | ICD-10-CM | POA: Diagnosis not present

## 2012-08-26 DIAGNOSIS — D485 Neoplasm of uncertain behavior of skin: Secondary | ICD-10-CM | POA: Diagnosis not present

## 2012-08-26 DIAGNOSIS — L820 Inflamed seborrheic keratosis: Secondary | ICD-10-CM | POA: Diagnosis not present

## 2012-08-27 DIAGNOSIS — I4891 Unspecified atrial fibrillation: Secondary | ICD-10-CM | POA: Diagnosis not present

## 2012-08-27 DIAGNOSIS — E785 Hyperlipidemia, unspecified: Secondary | ICD-10-CM | POA: Diagnosis not present

## 2012-08-27 DIAGNOSIS — I10 Essential (primary) hypertension: Secondary | ICD-10-CM | POA: Diagnosis not present

## 2012-09-04 DIAGNOSIS — Z23 Encounter for immunization: Secondary | ICD-10-CM | POA: Diagnosis not present

## 2012-10-22 DIAGNOSIS — M899 Disorder of bone, unspecified: Secondary | ICD-10-CM | POA: Diagnosis not present

## 2012-10-22 DIAGNOSIS — M949 Disorder of cartilage, unspecified: Secondary | ICD-10-CM | POA: Diagnosis not present

## 2012-10-22 DIAGNOSIS — I10 Essential (primary) hypertension: Secondary | ICD-10-CM | POA: Diagnosis not present

## 2012-10-22 DIAGNOSIS — R319 Hematuria, unspecified: Secondary | ICD-10-CM | POA: Diagnosis not present

## 2012-10-22 DIAGNOSIS — Z79899 Other long term (current) drug therapy: Secondary | ICD-10-CM | POA: Diagnosis not present

## 2012-10-22 DIAGNOSIS — Z86010 Personal history of colonic polyps: Secondary | ICD-10-CM | POA: Diagnosis not present

## 2012-10-22 DIAGNOSIS — I4891 Unspecified atrial fibrillation: Secondary | ICD-10-CM | POA: Diagnosis not present

## 2012-10-22 DIAGNOSIS — E782 Mixed hyperlipidemia: Secondary | ICD-10-CM | POA: Diagnosis not present

## 2012-10-22 DIAGNOSIS — Z20828 Contact with and (suspected) exposure to other viral communicable diseases: Secondary | ICD-10-CM | POA: Diagnosis not present

## 2012-11-11 HISTORY — PX: COLONOSCOPY: SHX174

## 2012-12-07 DIAGNOSIS — R3129 Other microscopic hematuria: Secondary | ICD-10-CM | POA: Diagnosis not present

## 2012-12-07 DIAGNOSIS — R82998 Other abnormal findings in urine: Secondary | ICD-10-CM | POA: Diagnosis not present

## 2012-12-18 DIAGNOSIS — Z09 Encounter for follow-up examination after completed treatment for conditions other than malignant neoplasm: Secondary | ICD-10-CM | POA: Diagnosis not present

## 2012-12-18 DIAGNOSIS — Z86010 Personal history of colonic polyps: Secondary | ICD-10-CM | POA: Diagnosis not present

## 2012-12-18 DIAGNOSIS — K5730 Diverticulosis of large intestine without perforation or abscess without bleeding: Secondary | ICD-10-CM | POA: Diagnosis not present

## 2013-01-18 DIAGNOSIS — R82998 Other abnormal findings in urine: Secondary | ICD-10-CM | POA: Diagnosis not present

## 2013-01-25 ENCOUNTER — Telehealth: Payer: Self-pay | Admitting: Oncology

## 2013-01-25 ENCOUNTER — Other Ambulatory Visit: Payer: Self-pay | Admitting: Oncology

## 2013-01-25 ENCOUNTER — Encounter: Payer: Self-pay | Admitting: Oncology

## 2013-01-25 NOTE — Telephone Encounter (Signed)
S/W PT IN RE NP APPT 03/31 @ 3 W/DR. HA. REFERRING DR. Bjorn Pippin DX- ABD ADENOPATHY W/ELEVATED WBC  WELCOME PACKET MAILED.

## 2013-01-25 NOTE — Telephone Encounter (Signed)
C/D 01/25/13 for appt. 02/08/13

## 2013-02-08 ENCOUNTER — Ambulatory Visit (HOSPITAL_BASED_OUTPATIENT_CLINIC_OR_DEPARTMENT_OTHER): Payer: Medicare Other | Admitting: Oncology

## 2013-02-08 ENCOUNTER — Telehealth: Payer: Self-pay | Admitting: Oncology

## 2013-02-08 ENCOUNTER — Other Ambulatory Visit (HOSPITAL_COMMUNITY)
Admission: RE | Admit: 2013-02-08 | Discharge: 2013-02-08 | Disposition: A | Discharge status: Home / Self Care | Payer: Medicare Other | Source: Ambulatory Visit | Attending: Oncology | Admitting: Oncology

## 2013-02-08 ENCOUNTER — Other Ambulatory Visit (HOSPITAL_BASED_OUTPATIENT_CLINIC_OR_DEPARTMENT_OTHER): Payer: Medicare Other | Admitting: Lab

## 2013-02-08 ENCOUNTER — Ambulatory Visit: Payer: PRIVATE HEALTH INSURANCE

## 2013-02-08 ENCOUNTER — Encounter: Payer: Self-pay | Admitting: Oncology

## 2013-02-08 VITALS — BP 135/66 | HR 61 | Temp 98.3°F | Resp 18 | Ht 61.75 in | Wt 136.6 lb

## 2013-02-08 DIAGNOSIS — D72820 Lymphocytosis (symptomatic): Secondary | ICD-10-CM

## 2013-02-08 DIAGNOSIS — R591 Generalized enlarged lymph nodes: Secondary | ICD-10-CM

## 2013-02-08 DIAGNOSIS — R599 Enlarged lymph nodes, unspecified: Secondary | ICD-10-CM

## 2013-02-08 LAB — LACTATE DEHYDROGENASE (CC13): LDH: 146 U/L (ref 125–245)

## 2013-02-08 LAB — CBC WITH DIFFERENTIAL/PLATELET
BASO%: 0.4 % (ref 0.0–2.0)
EOS%: 1.3 % (ref 0.0–7.0)
MCH: 27.3 pg (ref 25.1–34.0)
MCHC: 33.1 g/dL (ref 31.5–36.0)
RBC: 4.59 10*6/uL (ref 3.70–5.45)
RDW: 14.2 % (ref 11.2–14.5)
lymph#: 17.3 10*3/uL — ABNORMAL HIGH (ref 0.9–3.3)

## 2013-02-08 LAB — COMPREHENSIVE METABOLIC PANEL (CC13)
ALT: 21 U/L (ref 0–55)
AST: 20 U/L (ref 5–34)
Albumin: 3.8 g/dL (ref 3.5–5.0)
Alkaline Phosphatase: 66 U/L (ref 40–150)
BUN: 17.4 mg/dL (ref 7.0–26.0)
Potassium: 3.6 mEq/L (ref 3.5–5.1)

## 2013-02-08 NOTE — Patient Instructions (Addendum)
1.  Issue:  Elevated lymphocytes and slightly enlarged abdominal nodes. 2.  Potential causes:  Chronic lymphoid leukemia (CLL); other indolent lymphoma. 3.  Work up:  Blood test to confirm CLL.   4.  If CLL is confirmed; my recommendation is observation. 5.  Indications for treatment of CLL or other indolent lymphoma include:  *  Massive nodes.  *  Recurrent infections.  *  Constitutional symptoms.  *  Anemia or low platelet count.  6.  Follow up:  In about 6 months.

## 2013-02-09 DIAGNOSIS — C9110 Chronic lymphocytic leukemia of B-cell type not having achieved remission: Secondary | ICD-10-CM | POA: Diagnosis not present

## 2013-02-10 ENCOUNTER — Encounter: Payer: Self-pay | Admitting: Oncology

## 2013-02-10 NOTE — Progress Notes (Signed)
[facility]  [phone] Fax:([phone]     INITIAL HEMATOLOGY CONSULTATION    Referral MD:  Dr. Selena Batten, M.D.  Reason for Referral: lymphocytosis and mild abdominal adenopathy.     HPI: [name] is a 67 year-old woman with no significant PMH.  She recently had microscopic hematuria.  Evaluation with Urology was negative including cystoscopy.  However, CT abdomen for hematuria on 12/31/2012 from Urology showed abdominal adenopathy with the biggest nodes in the porta hepatis and ileocolic node of about 14mm.  She was thus kindly referred to the Cancer for evaluation.  [name] presented to the clinic by herself today.  She denied hematuria.  She reports feeling well.  Patient denies fever, anorexia, weight loss, fatigue, headache, visual changes, confusion, drenching night sweats, palpable lymph node swelling, mucositis, odynophagia, dysphagia, nausea vomiting, jaundice, chest pain, palpitation, shortness of breath, dyspnea on exertion, productive cough, gum bleeding, epistaxis, hematemesis, hemoptysis, abdominal pain, abdominal swelling, early satiety, melena, hematochezia, hematuria, skin rash, spontaneous bleeding, joint swelling, joint pain, heat or cold intolerance, bowel bladder incontinence, back pain, focal motor weakness, paresthesia, depression, suicidal or homicidal ideation, feeling hopelessness.     Past Medical History  Diagnosis Date  . Adenopathy   . Lymphocytosis   . HTN (hypertension)   . A-fib   . Hyperlipidemia   :    Past Surgical History  Procedure Laterality Date  . Cholecystectomy    . Abdominal hysterectomy      endometriosis, fibroid  . Thumb arthroscopy    . Appendectomy    . Breast biopsy      benign cyst.  . Colonoscopy  11/2012    Per Dr. Matthias Hughs.  neg.   . Total hip arthroplasty  2010  :   CURRENT MEDS: Current Outpatient Prescriptions  Medication Sig Dispense Refill  . acetaminophen  (TYLENOL) 500 MG tablet Take 500 mg by mouth every 6 (six) hours as needed for pain.      Marland Kitchen aspirin 81 MG tablet Take 81 mg by mouth daily.      Marland Kitchen atorvastatin (LIPITOR) 40 MG tablet Take 40 mg by mouth daily.      . Calcium Carb-Cholecalciferol (CALCIUM + D3) 600-200 MG-UNIT TABS Take 1 tablet by mouth daily.      Marland Kitchen DIGOX 0.125 MG tablet Take 0.125 mg by mouth daily.      . fluticasone (FLONASE) 50 MCG/ACT nasal spray Place 2 sprays into the nose daily.      . folic acid (FOLVITE) 1 MG tablet Take 1 mg by mouth daily.      . hydrochlorothiazide (HYDRODIURIL) 12.5 MG tablet Take 12.5 mg by mouth daily.      Marland Kitchen ketotifen (ZADITOR) 0.025 % ophthalmic solution Apply 1 drop to eye 2 (two) times daily.      . metoprolol (LOPRESSOR) 50 MG tablet Take 50 mg by mouth 2 (two) times daily.      . Multiple Vitamin (MULTIVITAMIN) tablet Take 1 tablet by mouth daily.      . potassium chloride SA (K-DUR,KLOR-CON) 20 MEQ tablet Take 20 mEq by mouth 3 (three) times a week. Mon, Wed, Fri       No current facility-administered medications for this visit.      Allergies  Allergen Reactions  . Codeine Nausea And Vomiting  . Demerol (Meperidine) Nausea And Vomiting  . Morphine And Related Nausea And Vomiting  . Ciprofloxacin Rash  . Sulfa Antibiotics Rash  :  Family History  Problem Relation Age of Onset  . Renal Disease Mother   . Cancer Mother     colon cancer  . Cancer Father     colon cancer  :  History   Social History  . Marital Status: Married    Spouse Name: N/A    Number of Children: 2  . Years of Education: N/A   Occupational History  .      retired [employer]; med surgery.    Social History Main Topics  . Smoking status: Never Smoker   . Smokeless tobacco: Never Used  . Alcohol Use: No  . Drug Use: No  . Sexually Active: Not on file   Other Topics Concern  . Not on file   Social History Narrative  . No narrative on file  :  REVIEW OF SYSTEM:  The rest of the 14-point review  of sytem was negative.   Exam: ECOG 0.   General:  well-nourished woman,  in no acute distress.  Eyes:  no scleral icterus.  ENT:  There were no oropharyngeal lesions.  Neck was without thyromegaly.  Lymphatics:  Negative cervical, supraclavicular or axillary adenopathy.  Respiratory: lungs were clear bilaterally without wheezing or crackles.  Cardiovascular:  Regular rate and rhythm, S1/S2, without murmur, rub or gallop.  There was no pedal edema.  GI:  abdomen was soft, flat, nontender, nondistended, without organomegaly.  Muscoloskeletal:  no spinal tenderness of palpation of vertebral spine.  Skin exam was without echymosis, petichae.  Neuro exam was nonfocal.  Patient was able to get on and off exam table without assistance.  Gait was normal.  Patient was alerted and oriented.  Attention was good.   Language was appropriate.  Mood was normal without depression.  Speech was not pressured.  Thought content was not tangential.    LABS:  Lab Results  Component Value Date   WBC 22.6* 02/08/2013   HGB 12.5 02/08/2013   HCT 37.8 02/08/2013   PLT 158 02/08/2013   GLUCOSE 103* 02/08/2013   CHOL  Value: 143        ATP III CLASSIFICATION:  <200     mg/dL   Desirable  161-096  mg/dL   Borderline High  >=045    mg/dL   High        02/17/8118   TRIG 201* 01/02/2009   HDL 45 01/02/2009   LDLCALC  Value: 58        Total Cholesterol/HDL:CHD Risk Coronary Heart Disease Risk Table                     Men   Women  1/2 Average Risk   3.4   3.3  Average Risk       5.0   4.4  2 X Average Risk   9.6   7.1  3 X Average Risk  23.4   11.0        Use the calculated Patient Ratio above and the CHD Risk Table to determine the patient's CHD Risk.        ATP III CLASSIFICATION (LDL):  <100     mg/dL   Optimal  147-829  mg/dL   Near or Above                    Optimal  130-159  mg/dL   Borderline  562-130  mg/dL   High  >865     mg/dL   Very High 7/84/6962  ALT 21 02/08/2013   AST 20 02/08/2013   NA 146* 02/08/2013   K 3.6  02/08/2013   CL 104 02/08/2013   CREATININE 1.0 02/08/2013   BUN 17.4 02/08/2013   CO2 32* 02/08/2013   INR 2.8* 03/25/2009   HGBA1C  Value: 5.5 (NOTE)   The ADA recommends the following therapeutic goal for glycemic   control related to Hgb A1C measurement:   Goal of Therapy:   < 7.0% Hgb A1C   Reference: American Diabetes Association: Clinical Practice   Recommendations 2008, Diabetes Care,  2008, 31:(Suppl 1). 01/02/2009    Blood smear review:   I personally reviewed the patient's peripheral blood smear today.  There was isocytosis.  There was no peripheral blast.  There was lymphocytosis with mature lymphocytes and smudged cells.  There was no schistocytosis, spherocytosis, target cell, rouleaux formation, tear drop cell.  There was no giant platelets or platelet clumps.      ASSESSMENT AND PLAN:   1.  Issue:  Lymphocytosis and slightly enlarged abdominal nodes. 2.  Potential causes:  Chronic lymphoid leukemia (CLL); other indolent lymphoma. 3.  Work up:  Peripheral blood for flow cytometry to confirm CLL.   4.  If CLL is confirmed; my recommendation is observation. 5.  Indications for treatment of CLL or other indolent lymphoma include:  *  Massive nodes.  *  Recurrent infections.  *  Constitutional symptoms.  *  Anemia or low platelet count.  She does not have any of these indications at this time.  Ms. Dilworth expressed informed understanding and agreed with the recommendation.   6.  Follow up:  In about 6 months.   The length of time of the face-to-face encounter was 30 minutes. More than 50% of time was spent counseling and coordination of care.     Thank you for this referral.

## 2013-05-17 ENCOUNTER — Other Ambulatory Visit (HOSPITAL_COMMUNITY): Payer: Self-pay | Admitting: Family Medicine

## 2013-05-17 DIAGNOSIS — Z1231 Encounter for screening mammogram for malignant neoplasm of breast: Secondary | ICD-10-CM

## 2013-05-20 ENCOUNTER — Ambulatory Visit (HOSPITAL_COMMUNITY)
Admission: RE | Admit: 2013-05-20 | Discharge: 2013-05-20 | Disposition: A | Discharge status: Home / Self Care | Payer: Medicare Other | Source: Ambulatory Visit | Attending: Family Medicine | Admitting: Family Medicine

## 2013-05-20 DIAGNOSIS — Z1231 Encounter for screening mammogram for malignant neoplasm of breast: Secondary | ICD-10-CM

## 2013-06-16 ENCOUNTER — Other Ambulatory Visit: Payer: Self-pay

## 2013-07-31 ENCOUNTER — Telehealth: Payer: Self-pay | Admitting: *Deleted

## 2013-07-31 NOTE — Telephone Encounter (Signed)
sw pt informed the pt that her appts for 9/30 have been rs to 09/01/13. gv appts for 09/01/13 w/labs@ 2:30pm and ov @ 3pm. Made the pt aware that i will mail a letter/cal...td

## 2013-08-10 ENCOUNTER — Ambulatory Visit: Payer: Medicare Other | Admitting: Oncology

## 2013-08-10 ENCOUNTER — Other Ambulatory Visit: Payer: Medicare Other | Admitting: Lab

## 2013-08-12 DIAGNOSIS — R3129 Other microscopic hematuria: Secondary | ICD-10-CM | POA: Diagnosis not present

## 2013-08-12 DIAGNOSIS — Z23 Encounter for immunization: Secondary | ICD-10-CM | POA: Diagnosis not present

## 2013-08-17 DIAGNOSIS — I4891 Unspecified atrial fibrillation: Secondary | ICD-10-CM | POA: Diagnosis not present

## 2013-08-17 DIAGNOSIS — J309 Allergic rhinitis, unspecified: Secondary | ICD-10-CM | POA: Diagnosis not present

## 2013-08-17 DIAGNOSIS — Z0000 Encounter for general adult medical examination without abnormal findings: Secondary | ICD-10-CM | POA: Diagnosis not present

## 2013-08-17 DIAGNOSIS — M899 Disorder of bone, unspecified: Secondary | ICD-10-CM | POA: Diagnosis not present

## 2013-08-17 DIAGNOSIS — I10 Essential (primary) hypertension: Secondary | ICD-10-CM | POA: Diagnosis not present

## 2013-08-17 DIAGNOSIS — E782 Mixed hyperlipidemia: Secondary | ICD-10-CM | POA: Diagnosis not present

## 2013-08-17 DIAGNOSIS — C9110 Chronic lymphocytic leukemia of B-cell type not having achieved remission: Secondary | ICD-10-CM | POA: Diagnosis not present

## 2013-08-26 DIAGNOSIS — I10 Essential (primary) hypertension: Secondary | ICD-10-CM | POA: Diagnosis not present

## 2013-08-26 DIAGNOSIS — E785 Hyperlipidemia, unspecified: Secondary | ICD-10-CM | POA: Diagnosis not present

## 2013-08-26 DIAGNOSIS — I4891 Unspecified atrial fibrillation: Secondary | ICD-10-CM | POA: Diagnosis not present

## 2013-08-31 ENCOUNTER — Encounter: Payer: Self-pay | Admitting: Hematology and Oncology

## 2013-08-31 ENCOUNTER — Other Ambulatory Visit: Payer: Self-pay | Admitting: Hematology and Oncology

## 2013-08-31 DIAGNOSIS — C9110 Chronic lymphocytic leukemia of B-cell type not having achieved remission: Secondary | ICD-10-CM

## 2013-08-31 HISTORY — DX: Chronic lymphocytic leukemia of B-cell type not having achieved remission: C91.10

## 2013-09-01 ENCOUNTER — Ambulatory Visit (HOSPITAL_BASED_OUTPATIENT_CLINIC_OR_DEPARTMENT_OTHER): Payer: Medicare Other | Admitting: Hematology and Oncology

## 2013-09-01 ENCOUNTER — Other Ambulatory Visit (HOSPITAL_BASED_OUTPATIENT_CLINIC_OR_DEPARTMENT_OTHER): Payer: Medicare Other | Admitting: Lab

## 2013-09-01 ENCOUNTER — Other Ambulatory Visit (HOSPITAL_COMMUNITY)
Admission: RE | Admit: 2013-09-01 | Discharge: 2013-09-01 | Disposition: A | Discharge status: Home / Self Care | Payer: Medicare Other | Source: Ambulatory Visit | Attending: Hematology and Oncology | Admitting: Hematology and Oncology

## 2013-09-01 VITALS — BP 109/59 | HR 65 | Temp 98.2°F | Resp 20 | Ht 61.75 in | Wt 138.6 lb

## 2013-09-01 DIAGNOSIS — D72829 Elevated white blood cell count, unspecified: Secondary | ICD-10-CM

## 2013-09-01 DIAGNOSIS — C9110 Chronic lymphocytic leukemia of B-cell type not having achieved remission: Secondary | ICD-10-CM

## 2013-09-01 LAB — CBC WITH DIFFERENTIAL/PLATELET
BASO%: 0.4 % (ref 0.0–2.0)
EOS%: 2.2 % (ref 0.0–7.0)
HCT: 39 % (ref 34.8–46.6)
LYMPH%: 75.7 % — ABNORMAL HIGH (ref 14.0–49.7)
MCH: 28.2 pg (ref 25.1–34.0)
MCHC: 33.6 g/dL (ref 31.5–36.0)
MCV: 84.1 fL (ref 79.5–101.0)
MONO%: 3.4 % (ref 0.0–14.0)
NEUT#: 4.8 10*3/uL (ref 1.5–6.5)
NEUT%: 18.3 % — ABNORMAL LOW (ref 38.4–76.8)
Platelets: 168 10*3/uL (ref 145–400)
RBC: 4.64 10*6/uL (ref 3.70–5.45)
lymph#: 19.6 10*3/uL — ABNORMAL HIGH (ref 0.9–3.3)
nRBC: 0 % (ref 0–0)

## 2013-09-01 LAB — COMPREHENSIVE METABOLIC PANEL (CC13)
ALT: 18 U/L (ref 0–55)
AST: 19 U/L (ref 5–34)
Alkaline Phosphatase: 73 U/L (ref 40–150)
Calcium: 10.5 mg/dL — ABNORMAL HIGH (ref 8.4–10.4)
Chloride: 104 mEq/L (ref 98–109)
Creatinine: 0.9 mg/dL (ref 0.6–1.1)
Potassium: 3.8 mEq/L (ref 3.5–5.1)

## 2013-09-01 LAB — TECHNOLOGIST REVIEW

## 2013-09-01 NOTE — Progress Notes (Signed)
[facility] OFFICE PROGRESS NOTE  Patient Care Team: Amanda Dimitri, MD as PCP - General (Family Medicine) Exie Parody, MD (Hematology and Oncology) Bjorn Pippin, MD as Attending Physician (Urology) Othella Boyer, MD as Attending Physician (Cardiology)  DIAGNOSIS: CLL, stage I on observation  SUMMARY OF ONCOLOGIC HISTORY: This is a very pleasant 67 year old lady who was diagnosed with CLL. The patient was found to have lymphocytosis and mild lymphadenopathy, discovered by her urologist for evaluation for mild hematuria. Because of hematuria was thought to be related to recurrent urinary tract infection.  INTERVAL HISTORY: Amanda [name] 67 y.o. female returns for further followup. She denies any new palpable lymphadenopathy. She denies any recent fever, chills, night sweats or abnormal weight loss The patient denies any recent signs or symptoms of bleeding such as spontaneous epistaxis, hematuria or hematochezia. Denies any recent infection.  I have reviewed the past medical history, past surgical history, social history and family history with the patient and they are unchanged from previous note.  ALLERGIES:  is allergic to codeine; demerol; morphine and related; ciprofloxacin; and sulfa antibiotics.  MEDICATIONS:  Current Outpatient Prescriptions  Medication Sig Dispense Refill  . acetaminophen (TYLENOL) 500 MG tablet Take 500 mg by mouth every 6 (six) hours as needed for pain.      Marland Kitchen aspirin 81 MG tablet Take 81 mg by mouth daily.      Marland Kitchen atorvastatin (LIPITOR) 40 MG tablet Take 40 mg by mouth daily.      . Calcium Carb-Cholecalciferol (CALCIUM + D3) 600-200 MG-UNIT TABS Take 1 tablet by mouth daily.      Marland Kitchen DIGOX 0.125 MG tablet Take 0.125 mg by mouth daily.      . fluticasone (FLONASE) 50 MCG/ACT nasal spray Place 2 sprays into the nose daily.      . folic acid (FOLVITE) 1 MG tablet Take 1 mg by mouth daily.      . hydrochlorothiazide (HYDRODIURIL) 12.5 MG tablet  Take 12.5 mg by mouth daily.      Marland Kitchen ketotifen (ZADITOR) 0.025 % ophthalmic solution Apply 1 drop to eye 2 (two) times daily.      . metoprolol (LOPRESSOR) 50 MG tablet Take 50 mg by mouth 2 (two) times daily.      . Multiple Vitamin (MULTIVITAMIN) tablet Take 1 tablet by mouth daily.      . potassium chloride SA (K-DUR,KLOR-CON) 20 MEQ tablet Take 20 mEq by mouth 3 (three) times a week. Mon, Wed, Fri       No current facility-administered medications for this visit.    REVIEW OF SYSTEMS:   Constitutional: Denies fevers, chills or abnormal weight loss Eyes: Denies blurriness of vision Ears, nose, mouth, throat, and face: Denies mucositis or sore throat Respiratory: Denies cough, dyspnea or wheezes Cardiovascular: Denies palpitation, chest discomfort or lower extremity swelling Gastrointestinal:  Denies nausea, heartburn or change in bowel habits Skin: Denies abnormal skin rashes Lymphatics: Denies new lymphadenopathy or easy bruising Neurological:Denies numbness, tingling or new weaknesses Behavioral/Psych: Mood is stable, no new changes  All other systems were reviewed with the patient and are negative.  PHYSICAL EXAMINATION: ECOG PERFORMANCE STATUS: 0 - Asymptomatic  Filed Vitals:   09/01/13 1450  BP: 109/59  Pulse: 65  Temp: 98.2 F (36.8 C)  Resp: 20   Filed Weights   09/01/13 1450  Weight: 138 lb 9.6 oz (62.869 kg)    GENERAL:alert, no distress and comfortable SKIN: skin color, texture, turgor are normal, no rashes  or significant lesions EYES: normal, Conjunctiva are pink and non-injected, sclera clear OROPHARYNX:no exudate, no erythema and lips, buccal mucosa, and tongue normal  NECK: supple, thyroid normal size, non-tender, without nodularity LYMPH:  no palpable lymphadenopathy in the cervical, axillary or inguinal LUNGS: clear to auscultation and percussion with normal breathing effort HEART: regular rate & rhythm and no murmurs and no lower extremity  edema ABDOMEN:abdomen soft, non-tender and normal bowel sounds. No palpable splenomegaly  Musculoskeletal:no cyanosis of digits and no clubbing  NEURO: alert & oriented x 3 with fluent speech, no focal motor/sensory deficits  LABORATORY DATA:  I have reviewed the data as listed    Component Value Date/Time   NA 144 09/01/2013 1404   NA 137 03/24/2009 0350   K 3.8 09/01/2013 1404   K 3.7 03/24/2009 0350   CL 104 02/08/2013 1454   CL 101 03/24/2009 0350   CO2 30* 09/01/2013 1404   CO2 30 03/24/2009 0350   GLUCOSE 111 09/01/2013 1404   GLUCOSE 103* 02/08/2013 1454   GLUCOSE 153* 03/24/2009 0350   BUN 20.3 09/01/2013 1404   BUN 7 03/24/2009 0350   CREATININE 0.9 09/01/2013 1404   CREATININE 0.70 03/24/2009 0350   CALCIUM 10.5* 09/01/2013 1404   CALCIUM 8.8 03/24/2009 0350   PROT 6.9 09/01/2013 1404   PROT 6.8 03/15/2009 0938   ALBUMIN 4.0 09/01/2013 1404   ALBUMIN 4.3 03/15/2009 0938   AST 19 09/01/2013 1404   AST 24 03/15/2009 0938   ALT 18 09/01/2013 1404   ALT 25 03/15/2009 0938   ALKPHOS 73 09/01/2013 1404   ALKPHOS 66 03/15/2009 0938   BILITOT 0.78 09/01/2013 1404   BILITOT 0.9 03/15/2009 0938   GFRNONAA >60 03/24/2009 0350   GFRAA  Value: >60        The eGFR has been calculated using the MDRD equation. This calculation has not been validated in all clinical situations. eGFR's persistently <60 mL/min signify possible Chronic Kidney Disease. 03/24/2009 0350    No results found for this basename: SPEP, UPEP,  kappa and lambda light chains    Lab Results  Component Value Date   WBC 25.9* 09/01/2013   NEUTROABS 4.8 09/01/2013   HGB 13.1 09/01/2013   HCT 39.0 09/01/2013   MCV 84.1 09/01/2013   PLT 168 09/01/2013      Chemistry      Component Value Date/Time   NA 144 09/01/2013 1404   NA 137 03/24/2009 0350   K 3.8 09/01/2013 1404   K 3.7 03/24/2009 0350   CL 104 02/08/2013 1454   CL 101 03/24/2009 0350   CO2 30* 09/01/2013 1404   CO2 30 03/24/2009 0350   BUN 20.3 09/01/2013 1404    BUN 7 03/24/2009 0350   CREATININE 0.9 09/01/2013 1404   CREATININE 0.70 03/24/2009 0350      Component Value Date/Time   CALCIUM 10.5* 09/01/2013 1404   CALCIUM 8.8 03/24/2009 0350   ALKPHOS 73 09/01/2013 1404   ALKPHOS 66 03/15/2009 0938   AST 19 09/01/2013 1404   AST 24 03/15/2009 0938   ALT 18 09/01/2013 1404   ALT 25 03/15/2009 0938   BILITOT 0.78 09/01/2013 1404   BILITOT 0.9 03/15/2009 0938       RADIOGRAPHIC STUDIES: She had repeat CT scan of the abdomen and pelvis on 08/18/2013 we show very mild lymphadenopathy but no other evidence of malignancy.  ASSESSMENT:  CLL, stage I with lymphadenopathy  PLAN:  #1 CLL She very mild progression of the  leukocytosis but overall asymptomatic. We discussed about the natural history of CLL. She will be placed on observation only at this point. I educated the patient's signs and symptoms to watch out for disease progression. I see no benefit for [place] to order excessive imaging study at this point. She can just be observed. #2 preventive care I warned about the risk of other cancer due to diagnosis of CLL. She needs to make an appointment to see her dermatologist for yearly skin exam. The patient has received influenza vaccination. Her age-appropriate screening program set up today. Finally I make recommendations for her to start vitamin D supplementation.  Orders Placed This Encounter  Procedures  . Comprehensive metabolic panel    Standing Status: Future     Number of Occurrences:      Standing Expiration Date: 09/01/2014  . CBC with Differential    Standing Status: Future     Number of Occurrences:      Standing Expiration Date: 05/24/2014  . Lactate dehydrogenase    Standing Status: Future     Number of Occurrences:      Standing Expiration Date: 09/01/2014  . IgG, IgA, IgM    Standing Status: Future     Number of Occurrences:      Standing Expiration Date: 09/01/2014  . Beta 2 microglobuline, serum    Standing Status: Future      Number of Occurrences:      Standing Expiration Date: 09/01/2014   All questions were answered. The patient knows to call the clinic with any problems, questions or concerns. No barriers to learning was detected. I spent 15 minutes counseling the patient face to face. The total time spent in the appointment was 20 minutes and more than 50% was on counseling and review of test results     [facility], Greydon Betke, MD 09/01/2013 3:10 PM

## 2013-09-02 ENCOUNTER — Telehealth: Payer: Self-pay | Admitting: Hematology and Oncology

## 2013-09-02 LAB — IGG, IGA, IGM: IgG (Immunoglobin G), Serum: 514 mg/dL — ABNORMAL LOW (ref 690–1700)

## 2013-09-02 NOTE — Telephone Encounter (Signed)
S/w the pt and she is aware of her July appts and she would like for [place] to mail out the calendar for 2015.

## 2013-09-07 ENCOUNTER — Telehealth: Payer: Self-pay | Admitting: Hematology and Oncology

## 2013-09-07 NOTE — Telephone Encounter (Signed)
I have mailed the pt her July 2015 appt calendar.

## 2013-09-08 LAB — FISH, PERIPHERAL BLOOD

## 2013-09-09 ENCOUNTER — Other Ambulatory Visit: Payer: Self-pay | Admitting: Dermatology

## 2013-09-09 DIAGNOSIS — L821 Other seborrheic keratosis: Secondary | ICD-10-CM | POA: Diagnosis not present

## 2013-09-09 DIAGNOSIS — D485 Neoplasm of uncertain behavior of skin: Secondary | ICD-10-CM | POA: Diagnosis not present

## 2013-09-10 LAB — TISSUE HYBRIDIZATION TO NCBH

## 2013-09-16 ENCOUNTER — Other Ambulatory Visit: Payer: Self-pay

## 2013-09-22 DIAGNOSIS — R3129 Other microscopic hematuria: Secondary | ICD-10-CM | POA: Diagnosis not present

## 2013-09-22 DIAGNOSIS — R599 Enlarged lymph nodes, unspecified: Secondary | ICD-10-CM | POA: Diagnosis not present

## 2014-02-22 DIAGNOSIS — D239 Other benign neoplasm of skin, unspecified: Secondary | ICD-10-CM | POA: Diagnosis not present

## 2014-02-22 DIAGNOSIS — D485 Neoplasm of uncertain behavior of skin: Secondary | ICD-10-CM | POA: Diagnosis not present

## 2014-02-22 DIAGNOSIS — L821 Other seborrheic keratosis: Secondary | ICD-10-CM | POA: Diagnosis not present

## 2014-02-22 DIAGNOSIS — L570 Actinic keratosis: Secondary | ICD-10-CM | POA: Diagnosis not present

## 2014-02-22 DIAGNOSIS — Z85828 Personal history of other malignant neoplasm of skin: Secondary | ICD-10-CM | POA: Diagnosis not present

## 2014-02-23 DIAGNOSIS — L570 Actinic keratosis: Secondary | ICD-10-CM | POA: Diagnosis not present

## 2014-02-23 DIAGNOSIS — B079 Viral wart, unspecified: Secondary | ICD-10-CM | POA: Diagnosis not present

## 2014-05-10 ENCOUNTER — Other Ambulatory Visit (HOSPITAL_COMMUNITY): Payer: Self-pay | Admitting: Family Medicine

## 2014-05-10 DIAGNOSIS — Z1231 Encounter for screening mammogram for malignant neoplasm of breast: Secondary | ICD-10-CM

## 2014-05-23 ENCOUNTER — Ambulatory Visit (HOSPITAL_COMMUNITY)
Admission: RE | Admit: 2014-05-23 | Discharge: 2014-05-23 | Disposition: A | Discharge status: Home / Self Care | Payer: Medicare Other | Source: Ambulatory Visit | Attending: Family Medicine | Admitting: Family Medicine

## 2014-05-23 DIAGNOSIS — Z1231 Encounter for screening mammogram for malignant neoplasm of breast: Secondary | ICD-10-CM | POA: Diagnosis not present

## 2014-06-06 ENCOUNTER — Other Ambulatory Visit: Payer: Self-pay | Admitting: Hematology and Oncology

## 2014-06-06 ENCOUNTER — Other Ambulatory Visit (HOSPITAL_BASED_OUTPATIENT_CLINIC_OR_DEPARTMENT_OTHER): Payer: Medicare Other

## 2014-06-06 ENCOUNTER — Encounter: Payer: Self-pay | Admitting: Hematology and Oncology

## 2014-06-06 ENCOUNTER — Ambulatory Visit (HOSPITAL_BASED_OUTPATIENT_CLINIC_OR_DEPARTMENT_OTHER): Payer: Medicare Other | Admitting: Hematology and Oncology

## 2014-06-06 VITALS — BP 141/65 | HR 56 | Temp 98.7°F | Resp 20 | Ht 61.75 in | Wt 140.6 lb

## 2014-06-06 DIAGNOSIS — C9110 Chronic lymphocytic leukemia of B-cell type not having achieved remission: Secondary | ICD-10-CM | POA: Diagnosis not present

## 2014-06-06 LAB — CBC WITH DIFFERENTIAL/PLATELET
BASO%: 0.4 % (ref 0.0–2.0)
BASOS ABS: 0.1 10*3/uL (ref 0.0–0.1)
EOS%: 0.8 % (ref 0.0–7.0)
Eosinophils Absolute: 0.2 10*3/uL (ref 0.0–0.5)
HCT: 39.2 % (ref 34.8–46.6)
HGB: 12.7 g/dL (ref 11.6–15.9)
LYMPH%: 81.7 % — ABNORMAL HIGH (ref 14.0–49.7)
MCH: 27.4 pg (ref 25.1–34.0)
MCHC: 32.3 g/dL (ref 31.5–36.0)
MCV: 84.8 fL (ref 79.5–101.0)
MONO#: 0.7 10*3/uL (ref 0.1–0.9)
MONO%: 2.4 % (ref 0.0–14.0)
NEUT#: 4.4 10*3/uL (ref 1.5–6.5)
NEUT%: 14.7 % — ABNORMAL LOW (ref 38.4–76.8)
Platelets: 180 10*3/uL (ref 145–400)
RBC: 4.63 10*6/uL (ref 3.70–5.45)
RDW: 13.9 % (ref 11.2–14.5)
WBC: 30 10*3/uL — ABNORMAL HIGH (ref 3.9–10.3)
lymph#: 24.5 10*3/uL — ABNORMAL HIGH (ref 0.9–3.3)

## 2014-06-06 LAB — COMPREHENSIVE METABOLIC PANEL (CC13)
ALBUMIN: 4 g/dL (ref 3.5–5.0)
ALT: 26 U/L (ref 0–55)
AST: 23 U/L (ref 5–34)
Alkaline Phosphatase: 62 U/L (ref 40–150)
Anion Gap: 7 mEq/L (ref 3–11)
BUN: 16.8 mg/dL (ref 7.0–26.0)
CALCIUM: 10.2 mg/dL (ref 8.4–10.4)
CO2: 30 mEq/L — ABNORMAL HIGH (ref 22–29)
Chloride: 107 mEq/L (ref 98–109)
Creatinine: 0.9 mg/dL (ref 0.6–1.1)
GLUCOSE: 93 mg/dL (ref 70–140)
POTASSIUM: 4.1 meq/L (ref 3.5–5.1)
Sodium: 144 mEq/L (ref 136–145)
Total Bilirubin: 0.7 mg/dL (ref 0.20–1.20)
Total Protein: 6.4 g/dL (ref 6.4–8.3)

## 2014-06-06 LAB — TECHNOLOGIST REVIEW

## 2014-06-06 LAB — LACTATE DEHYDROGENASE (CC13): LDH: 156 U/L (ref 125–245)

## 2014-06-06 NOTE — Progress Notes (Signed)
[place] OFFICE PROGRESS NOTE  Patient Care Team: Cari Caraway, MD as PCP - General (Family Medicine) Malka So, MD as Attending Physician (Urology) Jacolyn Reedy, MD as Attending Physician (Cardiology) Heath Lark, MD as Consulting Physician (Hematology and Oncology)  SUMMARY OF ONCOLOGIC HISTORY: This is a very pleasant lady who was diagnosed with CLL, stage I. The patient was found to have lymphocytosis and mild abdominal and pelvic lymphadenopathy, discovered by her urologist for evaluation for mild hematuria. The cause of hematuria was thought to be related to recurrent urinary tract infection.  INTERVAL HISTORY: Please see below for problem oriented charting. She returns for followup on CLL. She denies new lymphadenopathy. Denies recent infection. She denies presents bleeding. She denies any recent fever, chills, night sweats or abnormal weight loss  REVIEW OF SYSTEMS:   Eyes: Denies blurriness of vision Ears, nose, mouth, throat, and face: Denies mucositis or sore throat Respiratory: Denies cough, dyspnea or wheezes Cardiovascular: Denies palpitation, chest discomfort or lower extremity swelling Gastrointestinal:  Denies nausea, heartburn or change in bowel habits Skin: Denies abnormal skin rashes Lymphatics: Denies new lymphadenopathy or easy bruising Neurological:Denies numbness, tingling or new weaknesses Behavioral/Psych: Mood is stable, no new changes  All other systems were reviewed with the patient and are negative.  I have reviewed the past medical history, past surgical history, social history and family history with the patient and they are unchanged from previous note.  ALLERGIES:  is allergic to codeine; demerol; morphine and related; ciprofloxacin; and sulfa antibiotics.  MEDICATIONS:  Current Outpatient Prescriptions  Medication Sig Dispense Refill  . acetaminophen (TYLENOL) 500 MG tablet Take 500 mg by mouth every 6 (six) hours as  needed for pain.      Marland Kitchen aspirin 81 MG tablet Take 81 mg by mouth daily.      Marland Kitchen atorvastatin (LIPITOR) 40 MG tablet Take 40 mg by mouth daily.      . Calcium Carb-Cholecalciferol (CALCIUM + D3) 600-200 MG-UNIT TABS Take 1 tablet by mouth daily.      Marland Kitchen DIGOX 0.125 MG tablet Take 0.125 mg by mouth daily.      . fluticasone (FLONASE) 50 MCG/ACT nasal spray Place 2 sprays into the nose daily.      . folic acid (FOLVITE) 1 MG tablet Take 1 mg by mouth daily.      . hydrochlorothiazide (HYDRODIURIL) 12.5 MG tablet Take 12.5 mg by mouth daily.      Marland Kitchen ketotifen (ZADITOR) 0.025 % ophthalmic solution Apply 1 drop to eye 2 (two) times daily.      . metoprolol (LOPRESSOR) 50 MG tablet Take 50 mg by mouth 2 (two) times daily.      . Multiple Vitamin (MULTIVITAMIN) tablet Take 1 tablet by mouth daily.      . potassium chloride SA (K-DUR,KLOR-CON) 20 MEQ tablet Take 20 mEq by mouth 3 (three) times a week. Mon, Wed, Fri       No current facility-administered medications for this visit.    PHYSICAL EXAMINATION: ECOG PERFORMANCE STATUS: 0 - Asymptomatic  Filed Vitals:   06/06/14 1147  BP: 141/65  Pulse: 56  Temp: 98.7 F (37.1 C)  Resp: 20   Filed Weights   06/06/14 1147  Weight: 140 lb 9.6 oz (63.776 kg)    GENERAL:alert, no distress and comfortable SKIN: skin color, texture, turgor are normal, no rashes or significant lesions EYES: normal, Conjunctiva are pink and non-injected, sclera clear OROPHARYNX:no exudate, no erythema and lips, buccal mucosa,  and tongue normal  NECK: supple, thyroid normal size, non-tender, without nodularity LYMPH:  no palpable lymphadenopathy in the cervical, axillary or inguinal LUNGS: clear to auscultation and percussion with normal breathing effort HEART: regular rate & rhythm and no murmurs and no lower extremity edema ABDOMEN:abdomen soft, non-tender and normal bowel sounds Musculoskeletal:no cyanosis of digits and no clubbing  NEURO: alert & oriented x 3 with  fluent speech, no focal motor/sensory deficits  LABORATORY DATA:  I have reviewed the data as listed    Component Value Date/Time   NA 144 06/06/2014 1131   NA 137 03/24/2009 0350   K 4.1 06/06/2014 1131   K 3.7 03/24/2009 0350   CL 104 02/08/2013 1454   CL 101 03/24/2009 0350   CO2 30* 06/06/2014 1131   CO2 30 03/24/2009 0350   GLUCOSE 93 06/06/2014 1131   GLUCOSE 103* 02/08/2013 1454   GLUCOSE 153* 03/24/2009 0350   BUN 16.8 06/06/2014 1131   BUN 7 03/24/2009 0350   CREATININE 0.9 06/06/2014 1131   CREATININE 0.70 03/24/2009 0350   CALCIUM 10.2 06/06/2014 1131   CALCIUM 8.8 03/24/2009 0350   PROT 6.4 06/06/2014 1131   PROT 6.8 03/15/2009 0938   ALBUMIN 4.0 06/06/2014 1131   ALBUMIN 4.3 03/15/2009 0938   AST 23 06/06/2014 1131   AST 24 03/15/2009 0938   ALT 26 06/06/2014 1131   ALT 25 03/15/2009 0938   ALKPHOS 62 06/06/2014 1131   ALKPHOS 66 03/15/2009 0938   BILITOT 0.70 06/06/2014 1131   BILITOT 0.9 03/15/2009 0938   GFRNONAA >60 03/24/2009 0350   GFRAA  Value: >60        The eGFR has been calculated using the MDRD equation. This calculation has not been validated in all clinical situations. eGFR's persistently <60 mL/min signify possible Chronic Kidney Disease. 03/24/2009 0350    No results found for this basename: SPEP,  UPEP,   kappa and lambda light chains    Lab Results  Component Value Date   WBC 30.0* 06/06/2014   NEUTROABS 4.4 06/06/2014   HGB 12.7 06/06/2014   HCT 39.2 06/06/2014   MCV 84.8 06/06/2014   PLT 180 06/06/2014      Chemistry      Component Value Date/Time   NA 144 06/06/2014 1131   NA 137 03/24/2009 0350   K 4.1 06/06/2014 1131   K 3.7 03/24/2009 0350   CL 104 02/08/2013 1454   CL 101 03/24/2009 0350   CO2 30* 06/06/2014 1131   CO2 30 03/24/2009 0350   BUN 16.8 06/06/2014 1131   BUN 7 03/24/2009 0350   CREATININE 0.9 06/06/2014 1131   CREATININE 0.70 03/24/2009 0350      Component Value Date/Time   CALCIUM 10.2 06/06/2014 1131   CALCIUM 8.8 03/24/2009 0350   ALKPHOS 62 06/06/2014  1131   ALKPHOS 66 03/15/2009 0938   AST 23 06/06/2014 1131   AST 24 03/15/2009 0938   ALT 26 06/06/2014 1131   ALT 25 03/15/2009 0938   BILITOT 0.70 06/06/2014 1131   BILITOT 0.9 03/15/2009 0938      ASSESSMENT & PLAN:  CLL (chronic lymphocytic leukemia) Clinically, she remained in stage I disease. She has no symptoms to suggest disease progression. I recommend yearly visit with history, physical examination and blood work. I recommend yearly influenza vaccination and preventive care.    Orders Placed This Encounter  Procedures  . CBC with Differential    Standing Status: Standing     Number  of Occurrences: 9     Standing Expiration Date: 06/07/2015  . Comprehensive metabolic panel    Standing Status: Future     Number of Occurrences:      Standing Expiration Date: 06/06/2015  . Lactate dehydrogenase    Standing Status: Future     Number of Occurrences:      Standing Expiration Date: 06/06/2015   All questions were answered. The patient knows to call the clinic with any problems, questions or concerns. No barriers to learning was detected. I spent 15 minutes counseling the patient face to face. The total time spent in the appointment was 20 minutes and more than 50% was on counseling and review of test results     [facility], [place], MD 06/06/2014 8:09 PM

## 2014-06-06 NOTE — Assessment & Plan Note (Signed)
Clinically, she remained in stage I disease. She has no symptoms to suggest disease progression. I recommend yearly visit with history, physical examination and blood work. I recommend yearly influenza vaccination and preventive care.   

## 2014-06-08 LAB — IGG, IGA, IGM
IgA: 90 mg/dL (ref 69–380)
IgG (Immunoglobin G), Serum: 445 mg/dL — ABNORMAL LOW (ref 690–1700)
IgM, Serum: 75 mg/dL (ref 52–322)

## 2014-06-08 LAB — BETA 2 MICROGLOBULIN, SERUM: Beta-2 Microglobulin: 2.81 mg/L — ABNORMAL HIGH (ref <=2.51)

## 2014-06-09 ENCOUNTER — Telehealth: Payer: Self-pay | Admitting: Hematology and Oncology

## 2014-06-09 NOTE — Telephone Encounter (Signed)
Per 07/27 POF, labs/ov, mailed AVS to pt.Marland KitchenKJ

## 2014-08-19 DIAGNOSIS — Z01419 Encounter for gynecological examination (general) (routine) without abnormal findings: Secondary | ICD-10-CM | POA: Diagnosis not present

## 2014-08-19 DIAGNOSIS — Z23 Encounter for immunization: Secondary | ICD-10-CM | POA: Diagnosis not present

## 2014-08-19 DIAGNOSIS — I480 Paroxysmal atrial fibrillation: Secondary | ICD-10-CM | POA: Diagnosis not present

## 2014-08-19 DIAGNOSIS — J309 Allergic rhinitis, unspecified: Secondary | ICD-10-CM | POA: Diagnosis not present

## 2014-08-19 DIAGNOSIS — Z0000 Encounter for general adult medical examination without abnormal findings: Secondary | ICD-10-CM | POA: Diagnosis not present

## 2014-08-19 DIAGNOSIS — I10 Essential (primary) hypertension: Secondary | ICD-10-CM | POA: Diagnosis not present

## 2014-08-19 DIAGNOSIS — E782 Mixed hyperlipidemia: Secondary | ICD-10-CM | POA: Diagnosis not present

## 2014-08-19 DIAGNOSIS — Z1389 Encounter for screening for other disorder: Secondary | ICD-10-CM | POA: Diagnosis not present

## 2014-08-19 DIAGNOSIS — C9110 Chronic lymphocytic leukemia of B-cell type not having achieved remission: Secondary | ICD-10-CM | POA: Diagnosis not present

## 2014-08-19 DIAGNOSIS — M899 Disorder of bone, unspecified: Secondary | ICD-10-CM | POA: Diagnosis not present

## 2014-09-13 DIAGNOSIS — E784 Other hyperlipidemia: Secondary | ICD-10-CM | POA: Diagnosis not present

## 2014-09-13 DIAGNOSIS — I10 Essential (primary) hypertension: Secondary | ICD-10-CM | POA: Diagnosis not present

## 2014-09-13 DIAGNOSIS — I481 Persistent atrial fibrillation: Secondary | ICD-10-CM | POA: Diagnosis not present

## 2014-09-13 DIAGNOSIS — I480 Paroxysmal atrial fibrillation: Secondary | ICD-10-CM | POA: Diagnosis not present

## 2014-10-11 DIAGNOSIS — Z23 Encounter for immunization: Secondary | ICD-10-CM | POA: Diagnosis not present

## 2015-02-16 DIAGNOSIS — D2372 Other benign neoplasm of skin of left lower limb, including hip: Secondary | ICD-10-CM | POA: Diagnosis not present

## 2015-02-16 DIAGNOSIS — M79672 Pain in left foot: Secondary | ICD-10-CM | POA: Diagnosis not present

## 2015-02-24 DIAGNOSIS — H01021 Squamous blepharitis right upper eyelid: Secondary | ICD-10-CM | POA: Diagnosis not present

## 2015-02-24 DIAGNOSIS — H01024 Squamous blepharitis left upper eyelid: Secondary | ICD-10-CM | POA: Diagnosis not present

## 2015-02-24 DIAGNOSIS — H01025 Squamous blepharitis left lower eyelid: Secondary | ICD-10-CM | POA: Diagnosis not present

## 2015-02-24 DIAGNOSIS — H3531 Nonexudative age-related macular degeneration: Secondary | ICD-10-CM | POA: Diagnosis not present

## 2015-02-24 DIAGNOSIS — H43813 Vitreous degeneration, bilateral: Secondary | ICD-10-CM | POA: Diagnosis not present

## 2015-02-24 DIAGNOSIS — H01022 Squamous blepharitis right lower eyelid: Secondary | ICD-10-CM | POA: Diagnosis not present

## 2015-02-24 DIAGNOSIS — H2513 Age-related nuclear cataract, bilateral: Secondary | ICD-10-CM | POA: Diagnosis not present

## 2015-02-27 ENCOUNTER — Other Ambulatory Visit: Payer: Self-pay | Admitting: Dermatology

## 2015-02-27 DIAGNOSIS — L570 Actinic keratosis: Secondary | ICD-10-CM | POA: Diagnosis not present

## 2015-02-27 DIAGNOSIS — D485 Neoplasm of uncertain behavior of skin: Secondary | ICD-10-CM | POA: Diagnosis not present

## 2015-02-27 DIAGNOSIS — Z85828 Personal history of other malignant neoplasm of skin: Secondary | ICD-10-CM | POA: Diagnosis not present

## 2015-02-27 DIAGNOSIS — C44619 Basal cell carcinoma of skin of left upper limb, including shoulder: Secondary | ICD-10-CM | POA: Diagnosis not present

## 2015-02-27 DIAGNOSIS — D225 Melanocytic nevi of trunk: Secondary | ICD-10-CM | POA: Diagnosis not present

## 2015-02-27 DIAGNOSIS — L821 Other seborrheic keratosis: Secondary | ICD-10-CM | POA: Diagnosis not present

## 2015-04-20 DIAGNOSIS — C44619 Basal cell carcinoma of skin of left upper limb, including shoulder: Secondary | ICD-10-CM | POA: Diagnosis not present

## 2015-05-08 ENCOUNTER — Other Ambulatory Visit: Payer: Self-pay

## 2015-05-09 ENCOUNTER — Other Ambulatory Visit (HOSPITAL_COMMUNITY): Payer: Self-pay | Admitting: Family Medicine

## 2015-05-09 DIAGNOSIS — Z1231 Encounter for screening mammogram for malignant neoplasm of breast: Secondary | ICD-10-CM

## 2015-05-26 ENCOUNTER — Ambulatory Visit (HOSPITAL_COMMUNITY)
Admission: RE | Admit: 2015-05-26 | Discharge: 2015-05-26 | Disposition: A | Discharge status: Home / Self Care | Payer: Medicare Other | Source: Ambulatory Visit | Attending: Family Medicine | Admitting: Family Medicine

## 2015-05-26 DIAGNOSIS — Z1231 Encounter for screening mammogram for malignant neoplasm of breast: Secondary | ICD-10-CM | POA: Diagnosis not present

## 2015-05-31 ENCOUNTER — Ambulatory Visit (HOSPITAL_COMMUNITY): Payer: Medicare Other

## 2015-05-31 DIAGNOSIS — M19042 Primary osteoarthritis, left hand: Secondary | ICD-10-CM | POA: Diagnosis not present

## 2015-05-31 DIAGNOSIS — Z96641 Presence of right artificial hip joint: Secondary | ICD-10-CM | POA: Diagnosis not present

## 2015-05-31 DIAGNOSIS — M1712 Unilateral primary osteoarthritis, left knee: Secondary | ICD-10-CM | POA: Diagnosis not present

## 2015-05-31 DIAGNOSIS — Z471 Aftercare following joint replacement surgery: Secondary | ICD-10-CM | POA: Diagnosis not present

## 2015-06-07 ENCOUNTER — Encounter: Payer: Self-pay | Admitting: Hematology and Oncology

## 2015-06-07 ENCOUNTER — Ambulatory Visit (HOSPITAL_BASED_OUTPATIENT_CLINIC_OR_DEPARTMENT_OTHER): Payer: Medicare Other | Admitting: Hematology and Oncology

## 2015-06-07 ENCOUNTER — Other Ambulatory Visit: Payer: Self-pay | Admitting: Hematology and Oncology

## 2015-06-07 ENCOUNTER — Other Ambulatory Visit (HOSPITAL_BASED_OUTPATIENT_CLINIC_OR_DEPARTMENT_OTHER): Payer: Medicare Other

## 2015-06-07 ENCOUNTER — Telehealth: Payer: Self-pay | Admitting: Hematology and Oncology

## 2015-06-07 VITALS — BP 136/62 | HR 54 | Temp 98.7°F | Resp 18 | Ht 61.75 in | Wt 135.8 lb

## 2015-06-07 DIAGNOSIS — C9110 Chronic lymphocytic leukemia of B-cell type not having achieved remission: Secondary | ICD-10-CM | POA: Diagnosis not present

## 2015-06-07 LAB — COMPREHENSIVE METABOLIC PANEL (CC13)
ALT: 26 U/L (ref 0–55)
ANION GAP: 7 meq/L (ref 3–11)
AST: 22 U/L (ref 5–34)
Albumin: 4.4 g/dL (ref 3.5–5.0)
Alkaline Phosphatase: 70 U/L (ref 40–150)
BILIRUBIN TOTAL: 0.92 mg/dL (ref 0.20–1.20)
BUN: 20.9 mg/dL (ref 7.0–26.0)
CHLORIDE: 106 meq/L (ref 98–109)
CO2: 29 mEq/L (ref 22–29)
CREATININE: 0.9 mg/dL (ref 0.6–1.1)
Calcium: 10 mg/dL (ref 8.4–10.4)
EGFR: 66 mL/min/{1.73_m2} — ABNORMAL LOW (ref >90)
GLUCOSE: 96 mg/dL (ref 70–140)
Potassium: 4.4 mEq/L (ref 3.5–5.1)
Sodium: 142 mEq/L (ref 136–145)
TOTAL PROTEIN: 6.5 g/dL (ref 6.4–8.3)

## 2015-06-07 LAB — CBC WITH DIFFERENTIAL/PLATELET
BASO%: 0.1 % (ref 0.0–2.0)
BASOS ABS: 0 10*3/uL (ref 0.0–0.1)
EOS%: 0.4 % (ref 0.0–7.0)
Eosinophils Absolute: 0.2 10*3/uL (ref 0.0–0.5)
HEMATOCRIT: 40.3 % (ref 34.8–46.6)
HGB: 13.2 g/dL (ref 11.6–15.9)
LYMPH%: 79.6 % — ABNORMAL HIGH (ref 14.0–49.7)
MCH: 27.9 pg (ref 25.1–34.0)
MCHC: 32.7 g/dL (ref 31.5–36.0)
MCV: 85.3 fL (ref 79.5–101.0)
MONO#: 1 10*3/uL — ABNORMAL HIGH (ref 0.1–0.9)
MONO%: 2.9 % (ref 0.0–14.0)
NEUT%: 17 % — AB (ref 38.4–76.8)
NEUTROS ABS: 6 10*3/uL (ref 1.5–6.5)
PLATELETS: 191 10*3/uL (ref 145–400)
RBC: 4.73 10*6/uL (ref 3.70–5.45)
RDW: 13.9 % (ref 11.2–14.5)
WBC: 35.1 10*3/uL — AB (ref 3.9–10.3)
lymph#: 27.9 10*3/uL — ABNORMAL HIGH (ref 0.9–3.3)

## 2015-06-07 LAB — TECHNOLOGIST REVIEW

## 2015-06-07 LAB — LACTATE DEHYDROGENASE (CC13): LDH: 155 U/L (ref 125–245)

## 2015-06-07 NOTE — Progress Notes (Signed)
[place] OFFICE PROGRESS NOTE  Patient Care Team: Cari Caraway, MD as PCP - General (Family Medicine) Irine Seal, MD as Attending Physician (Urology) Jacolyn Reedy, MD as Attending Physician (Cardiology) Heath Lark, MD as Consulting Physician (Hematology and Oncology)  SUMMARY OF ONCOLOGIC HISTORY:  This is a very pleasant lady who was diagnosed with CLL, stage I. The patient was found to have lymphocytosis and mild abdominal and pelvic lymphadenopathy, discovered by her urologist for evaluation for mild hematuria. The cause of hematuria was thought to be related to recurrent urinary tract infection.  INTERVAL HISTORY: Please see below for problem oriented charting. She feels well Denies lymphadenopathy No recent infection  REVIEW OF SYSTEMS:   Constitutional: Denies fevers, chills or abnormal weight loss Eyes: Denies blurriness of vision Ears, nose, mouth, throat, and face: Denies mucositis or sore throat Respiratory: Denies cough, dyspnea or wheezes Cardiovascular: Denies palpitation, chest discomfort or lower extremity swelling Gastrointestinal:  Denies nausea, heartburn or change in bowel habits Skin: Denies abnormal skin rashes Lymphatics: Denies new lymphadenopathy or easy bruising Neurological:Denies numbness, tingling or new weaknesses Behavioral/Psych: Mood is stable, no new changes  All other systems were reviewed with the patient and are negative.  I have reviewed the past medical history, past surgical history, social history and family history with the patient and they are unchanged from previous note.  ALLERGIES:  is allergic to codeine; demerol; morphine and related; ciprofloxacin; and sulfa antibiotics.  MEDICATIONS:  Current Outpatient Prescriptions  Medication Sig Dispense Refill  . acetaminophen (TYLENOL) 500 MG tablet Take 500 mg by mouth every 6 (six) hours as needed for pain.    Marland Kitchen aspirin 81 MG tablet Take 81 mg by mouth daily.    Marland Kitchen  atorvastatin (LIPITOR) 40 MG tablet Take 40 mg by mouth daily.    . Calcium Carb-Cholecalciferol (CALCIUM + D3) 600-200 MG-UNIT TABS Take 1 tablet by mouth daily.    Marland Kitchen DIGOX 0.125 MG tablet Take 0.125 mg by mouth daily.    . fluticasone (FLONASE) 50 MCG/ACT nasal spray Place 2 sprays into the nose daily.    . folic acid (FOLVITE) 1 MG tablet Take 1 mg by mouth daily.    . hydrochlorothiazide (HYDRODIURIL) 12.5 MG tablet Take 12.5 mg by mouth daily.    Marland Kitchen ketotifen (ZADITOR) 0.025 % ophthalmic solution Apply 1 drop to eye 2 (two) times daily.    . metoprolol (LOPRESSOR) 50 MG tablet Take 50 mg by mouth 2 (two) times daily.    . Multiple Vitamin (MULTIVITAMIN) tablet Take 1 tablet by mouth daily.    . Multiple Vitamins-Minerals (EYE VITAMINS & MINERALS) TABS Take by mouth.    . potassium chloride SA (K-DUR,KLOR-CON) 20 MEQ tablet Take 20 mEq by mouth 3 (three) times a week. Mon, Wed, Fri     No current facility-administered medications for this visit.    PHYSICAL EXAMINATION: ECOG PERFORMANCE STATUS: 0 - Asymptomatic  Filed Vitals:   06/07/15 1136  BP: 136/62  Pulse: 54  Temp: 98.7 F (37.1 C)  Resp: 18   Filed Weights   06/07/15 1136  Weight: 135 lb 12.8 oz (61.598 kg)    GENERAL:alert, no distress and comfortable SKIN: skin color, texture, turgor are normal, no rashes or significant lesions EYES: normal, Conjunctiva are pink and non-injected, sclera clear OROPHARYNX:no exudate, no erythema and lips, buccal mucosa, and tongue normal  NECK: supple, thyroid normal size, non-tender, without nodularity LYMPH:  no palpable lymphadenopathy in the cervical, axillary or inguinal  LUNGS: clear to auscultation and percussion with normal breathing effort HEART: regular rate & rhythm and no murmurs and no lower extremity edema ABDOMEN:abdomen soft, non-tender and normal bowel sounds Musculoskeletal:no cyanosis of digits and no clubbing  NEURO: alert & oriented x 3 with fluent speech, no  focal motor/sensory deficits  LABORATORY DATA:  I have reviewed the data as listed    Component Value Date/Time   NA 142 06/07/2015 1128   NA 137 03/24/2009 0350   K 4.4 06/07/2015 1128   K 3.7 03/24/2009 0350   CL 104 02/08/2013 1454   CL 101 03/24/2009 0350   CO2 29 06/07/2015 1128   CO2 30 03/24/2009 0350   GLUCOSE 96 06/07/2015 1128   GLUCOSE 103* 02/08/2013 1454   GLUCOSE 153* 03/24/2009 0350   BUN 20.9 06/07/2015 1128   BUN 7 03/24/2009 0350   CREATININE 0.9 06/07/2015 1128   CREATININE 0.70 03/24/2009 0350   CALCIUM 10.0 06/07/2015 1128   CALCIUM 8.8 03/24/2009 0350   PROT 6.5 06/07/2015 1128   PROT 6.8 03/15/2009 0938   ALBUMIN 4.4 06/07/2015 1128   ALBUMIN 4.3 03/15/2009 0938   AST 22 06/07/2015 1128   AST 24 03/15/2009 0938   ALT 26 06/07/2015 1128   ALT 25 03/15/2009 0938   ALKPHOS 70 06/07/2015 1128   ALKPHOS 66 03/15/2009 0938   BILITOT 0.92 06/07/2015 1128   BILITOT 0.9 03/15/2009 0938   GFRNONAA >60 03/24/2009 0350   GFRAA  03/24/2009 0350    >60        The eGFR has been calculated using the MDRD equation. This calculation has not been validated in all clinical situations. eGFR's persistently <60 mL/min signify possible Chronic Kidney Disease.    No results found for: SPEP, UPEP  Lab Results  Component Value Date   WBC 35.1* 06/07/2015   NEUTROABS 6.0 06/07/2015   HGB 13.2 06/07/2015   HCT 40.3 06/07/2015   MCV 85.3 06/07/2015   PLT 191 06/07/2015      Chemistry      Component Value Date/Time   NA 142 06/07/2015 1128   NA 137 03/24/2009 0350   K 4.4 06/07/2015 1128   K 3.7 03/24/2009 0350   CL 104 02/08/2013 1454   CL 101 03/24/2009 0350   CO2 29 06/07/2015 1128   CO2 30 03/24/2009 0350   BUN 20.9 06/07/2015 1128   BUN 7 03/24/2009 0350   CREATININE 0.9 06/07/2015 1128   CREATININE 0.70 03/24/2009 0350      Component Value Date/Time   CALCIUM 10.0 06/07/2015 1128   CALCIUM 8.8 03/24/2009 0350   ALKPHOS 70 06/07/2015 1128    ALKPHOS 66 03/15/2009 0938   AST 22 06/07/2015 1128   AST 24 03/15/2009 0938   ALT 26 06/07/2015 1128   ALT 25 03/15/2009 0938   BILITOT 0.92 06/07/2015 1128   BILITOT 0.9 03/15/2009 0938      ASSESSMENT & PLAN:  CLL (chronic lymphocytic leukemia) Clinically, she remained in stage I disease. She has no symptoms to suggest disease progression. I recommend yearly visit with history, physical examination and blood work. I recommend yearly influenza vaccination and preventive care.     Orders Placed This Encounter  Procedures  . Comprehensive metabolic panel    Standing Status: Future     Number of Occurrences:      Standing Expiration Date: 07/11/2016  . Lactate dehydrogenase    Standing Status: Future     Number of Occurrences:  Standing Expiration Date: 07/11/2016   All questions were answered. The patient knows to call the clinic with any problems, questions or concerns. No barriers to learning was detected. I spent 15 minutes counseling the patient face to face. The total time spent in the appointment was 20 minutes and more than 50% was on counseling and review of test results     [facility], Tyrrell Stephens, MD 06/07/2015 10:24 PM

## 2015-06-07 NOTE — Telephone Encounter (Signed)
per pof to sch pt appt-gave pt copy of avs °

## 2015-06-07 NOTE — Assessment & Plan Note (Signed)
Clinically, she remained in stage I disease. She has no symptoms to suggest disease progression. I recommend yearly visit with history, physical examination and blood work. I recommend yearly influenza vaccination and preventive care.

## 2015-08-25 DIAGNOSIS — I480 Paroxysmal atrial fibrillation: Secondary | ICD-10-CM | POA: Diagnosis not present

## 2015-08-25 DIAGNOSIS — E782 Mixed hyperlipidemia: Secondary | ICD-10-CM | POA: Diagnosis not present

## 2015-08-25 DIAGNOSIS — Z1389 Encounter for screening for other disorder: Secondary | ICD-10-CM | POA: Diagnosis not present

## 2015-08-25 DIAGNOSIS — J309 Allergic rhinitis, unspecified: Secondary | ICD-10-CM | POA: Diagnosis not present

## 2015-08-25 DIAGNOSIS — Z79899 Other long term (current) drug therapy: Secondary | ICD-10-CM | POA: Diagnosis not present

## 2015-08-25 DIAGNOSIS — M85852 Other specified disorders of bone density and structure, left thigh: Secondary | ICD-10-CM | POA: Diagnosis not present

## 2015-08-25 DIAGNOSIS — I10 Essential (primary) hypertension: Secondary | ICD-10-CM | POA: Diagnosis not present

## 2015-08-25 DIAGNOSIS — Z0000 Encounter for general adult medical examination without abnormal findings: Secondary | ICD-10-CM | POA: Diagnosis not present

## 2015-08-25 DIAGNOSIS — Z23 Encounter for immunization: Secondary | ICD-10-CM | POA: Diagnosis not present

## 2015-08-25 DIAGNOSIS — M899 Disorder of bone, unspecified: Secondary | ICD-10-CM | POA: Diagnosis not present

## 2015-09-19 DIAGNOSIS — E784 Other hyperlipidemia: Secondary | ICD-10-CM | POA: Diagnosis not present

## 2015-09-19 DIAGNOSIS — I480 Paroxysmal atrial fibrillation: Secondary | ICD-10-CM | POA: Diagnosis not present

## 2015-09-19 DIAGNOSIS — I10 Essential (primary) hypertension: Secondary | ICD-10-CM | POA: Diagnosis not present

## 2015-09-20 DIAGNOSIS — M8589 Other specified disorders of bone density and structure, multiple sites: Secondary | ICD-10-CM | POA: Diagnosis not present

## 2015-09-20 DIAGNOSIS — M859 Disorder of bone density and structure, unspecified: Secondary | ICD-10-CM | POA: Diagnosis not present

## 2016-02-14 DIAGNOSIS — L0291 Cutaneous abscess, unspecified: Secondary | ICD-10-CM | POA: Diagnosis not present

## 2016-02-14 DIAGNOSIS — L723 Sebaceous cyst: Secondary | ICD-10-CM | POA: Diagnosis not present

## 2016-03-08 DIAGNOSIS — H01022 Squamous blepharitis right lower eyelid: Secondary | ICD-10-CM | POA: Diagnosis not present

## 2016-03-08 DIAGNOSIS — H43813 Vitreous degeneration, bilateral: Secondary | ICD-10-CM | POA: Diagnosis not present

## 2016-03-08 DIAGNOSIS — H2513 Age-related nuclear cataract, bilateral: Secondary | ICD-10-CM | POA: Diagnosis not present

## 2016-03-08 DIAGNOSIS — H01024 Squamous blepharitis left upper eyelid: Secondary | ICD-10-CM | POA: Diagnosis not present

## 2016-03-08 DIAGNOSIS — H353131 Nonexudative age-related macular degeneration, bilateral, early dry stage: Secondary | ICD-10-CM | POA: Diagnosis not present

## 2016-03-08 DIAGNOSIS — H01025 Squamous blepharitis left lower eyelid: Secondary | ICD-10-CM | POA: Diagnosis not present

## 2016-03-08 DIAGNOSIS — H01021 Squamous blepharitis right upper eyelid: Secondary | ICD-10-CM | POA: Diagnosis not present

## 2016-03-27 DIAGNOSIS — L570 Actinic keratosis: Secondary | ICD-10-CM | POA: Diagnosis not present

## 2016-03-27 DIAGNOSIS — L821 Other seborrheic keratosis: Secondary | ICD-10-CM | POA: Diagnosis not present

## 2016-03-27 DIAGNOSIS — D225 Melanocytic nevi of trunk: Secondary | ICD-10-CM | POA: Diagnosis not present

## 2016-03-27 DIAGNOSIS — D489 Neoplasm of uncertain behavior, unspecified: Secondary | ICD-10-CM | POA: Diagnosis not present

## 2016-03-27 DIAGNOSIS — Z85828 Personal history of other malignant neoplasm of skin: Secondary | ICD-10-CM | POA: Diagnosis not present

## 2016-05-10 ENCOUNTER — Telehealth: Payer: Self-pay | Admitting: Hematology and Oncology

## 2016-05-10 NOTE — Telephone Encounter (Signed)
returned call and s.w. pt and confirmed July appt.Marland KitchenMarland KitchenMarland KitchenMarland Kitchenpt ok and aware

## 2016-05-20 ENCOUNTER — Other Ambulatory Visit: Payer: Self-pay | Admitting: Family Medicine

## 2016-05-20 DIAGNOSIS — Z1231 Encounter for screening mammogram for malignant neoplasm of breast: Secondary | ICD-10-CM

## 2016-06-05 ENCOUNTER — Ambulatory Visit (HOSPITAL_BASED_OUTPATIENT_CLINIC_OR_DEPARTMENT_OTHER): Payer: Medicare Other | Admitting: Hematology and Oncology

## 2016-06-05 ENCOUNTER — Encounter: Payer: Self-pay | Admitting: Hematology and Oncology

## 2016-06-05 ENCOUNTER — Other Ambulatory Visit (HOSPITAL_BASED_OUTPATIENT_CLINIC_OR_DEPARTMENT_OTHER): Payer: Medicare Other

## 2016-06-05 ENCOUNTER — Telehealth: Payer: Self-pay | Admitting: Hematology and Oncology

## 2016-06-05 VITALS — BP 126/59 | HR 56 | Temp 98.4°F | Resp 18 | Ht 61.75 in | Wt 138.5 lb

## 2016-06-05 DIAGNOSIS — C9110 Chronic lymphocytic leukemia of B-cell type not having achieved remission: Secondary | ICD-10-CM

## 2016-06-05 DIAGNOSIS — D696 Thrombocytopenia, unspecified: Secondary | ICD-10-CM

## 2016-06-05 LAB — CBC WITH DIFFERENTIAL/PLATELET
BASO%: 0.2 % (ref 0.0–2.0)
Basophils Absolute: 0.1 10*3/uL (ref 0.0–0.1)
EOS ABS: 0.1 10*3/uL (ref 0.0–0.5)
EOS%: 0.3 % (ref 0.0–7.0)
HEMATOCRIT: 38.3 % (ref 34.8–46.6)
HGB: 12.7 g/dL (ref 11.6–15.9)
LYMPH%: 82.2 % — AB (ref 14.0–49.7)
MCH: 27.9 pg (ref 25.1–34.0)
MCHC: 33.2 g/dL (ref 31.5–36.0)
MCV: 84 fL (ref 79.5–101.0)
MONO#: 0.9 10*3/uL (ref 0.1–0.9)
MONO%: 2.9 % (ref 0.0–14.0)
NEUT#: 4.6 10*3/uL (ref 1.5–6.5)
NEUT%: 14.4 % — ABNORMAL LOW (ref 38.4–76.8)
PLATELETS: 143 10*3/uL — AB (ref 145–400)
RBC: 4.56 10*6/uL (ref 3.70–5.45)
RDW: 13.7 % (ref 11.2–14.5)
WBC: 31.8 10*3/uL — ABNORMAL HIGH (ref 3.9–10.3)
lymph#: 26.1 10*3/uL — ABNORMAL HIGH (ref 0.9–3.3)
nRBC: 0 % (ref 0–0)

## 2016-06-05 LAB — COMPREHENSIVE METABOLIC PANEL
ALBUMIN: 4 g/dL (ref 3.5–5.0)
ALK PHOS: 81 U/L (ref 40–150)
ALT: 17 U/L (ref 0–55)
AST: 18 U/L (ref 5–34)
Anion Gap: 8 mEq/L (ref 3–11)
BILIRUBIN TOTAL: 0.76 mg/dL (ref 0.20–1.20)
BUN: 20.2 mg/dL (ref 7.0–26.0)
CALCIUM: 9.8 mg/dL (ref 8.4–10.4)
CO2: 30 mEq/L — ABNORMAL HIGH (ref 22–29)
Chloride: 106 mEq/L (ref 98–109)
Creatinine: 0.9 mg/dL (ref 0.6–1.1)
EGFR: 64 mL/min/{1.73_m2} — AB (ref >90)
Glucose: 103 mg/dl (ref 70–140)
POTASSIUM: 4.2 meq/L (ref 3.5–5.1)
Sodium: 143 mEq/L (ref 136–145)
TOTAL PROTEIN: 6.8 g/dL (ref 6.4–8.3)

## 2016-06-05 LAB — LACTATE DEHYDROGENASE: LDH: 169 U/L (ref 125–245)

## 2016-06-05 LAB — TECHNOLOGIST REVIEW

## 2016-06-05 NOTE — Progress Notes (Signed)
[name] OFFICE PROGRESS NOTE  Patient Care Team: Cari Caraway, MD as PCP - General (Family Medicine) Irine Seal, MD as Attending Physician (Urology) Jacolyn Reedy, MD as Attending Physician (Cardiology) Heath Lark, MD as Consulting Physician (Hematology and Oncology)  SUMMARY OF ONCOLOGIC HISTORY:  This is a very pleasant lady who was diagnosed with CLL, stage I. The patient was found to have lymphocytosis and mild abdominal and pelvic lymphadenopathy, discovered by her urologist for evaluation for mild hematuria. The cause of hematuria was thought to be related to recurrent urinary tract infection.  INTERVAL HISTORY:  Please see below for problem oriented charting. She feels well Denies lymphadenopathy No recent infection The patient denies any recent signs or symptoms of bleeding such as spontaneous epistaxis, hematuria or hematochezia.   REVIEW OF SYSTEMS:   Constitutional: Denies fevers, chills or abnormal weight loss Eyes: Denies blurriness of vision Ears, nose, mouth, throat, and face: Denies mucositis or sore throat Respiratory: Denies cough, dyspnea or wheezes Cardiovascular: Denies palpitation, chest discomfort or lower extremity swelling Gastrointestinal:  Denies nausea, heartburn or change in bowel habits Skin: Denies abnormal skin rashes Lymphatics: Denies new lymphadenopathy or easy bruising Neurological:Denies numbness, tingling or new weaknesses Behavioral/Psych: Mood is stable, no new changes  All other systems were reviewed with the patient and are negative.  I have reviewed the past medical history, past surgical history, social history and family history with the patient and they are unchanged from previous note.  ALLERGIES:  is allergic to codeine; demerol [meperidine]; morphine and related; ciprofloxacin; and sulfa antibiotics.  MEDICATIONS:  Current Outpatient Prescriptions  Medication Sig Dispense Refill  . acetaminophen (TYLENOL)  500 MG tablet Take 500 mg by mouth every 6 (six) hours as needed for pain.    Marland Kitchen aspirin 81 MG tablet Take 81 mg by mouth daily.    Marland Kitchen atorvastatin (LIPITOR) 40 MG tablet Take 40 mg by mouth daily.    . Calcium Carb-Cholecalciferol (CALCIUM + D3) 600-200 MG-UNIT TABS Take 1 tablet by mouth 3 (three) times a week.     Marland Kitchen DIGOX 0.125 MG tablet Take 0.125 mg by mouth daily.    . fluticasone (FLONASE) 50 MCG/ACT nasal spray Place 2 sprays into the nose daily.    . folic acid (FOLVITE) 1 MG tablet Take 1 mg by mouth daily.    . hydrochlorothiazide (HYDRODIURIL) 12.5 MG tablet Take 12.5 mg by mouth daily.    Marland Kitchen ketotifen (ZADITOR) 0.025 % ophthalmic solution Apply 1 drop to eye 2 (two) times daily.    . metoprolol (LOPRESSOR) 50 MG tablet Take 50 mg by mouth 2 (two) times daily.    . Multiple Vitamin (MULTIVITAMIN) tablet Take 1 tablet by mouth daily.    . Multiple Vitamins-Minerals (EYE VITAMINS & MINERALS) TABS Take by mouth.    . potassium chloride SA (K-DUR,KLOR-CON) 20 MEQ tablet Take 20 mEq by mouth 3 (three) times a week. Mon, Wed, Fri     No current facility-administered medications for this visit.     PHYSICAL EXAMINATION: ECOG PERFORMANCE STATUS: 0 - Asymptomatic  Vitals:   06/05/16 1052  BP: (!) 126/59  Pulse: (!) 56  Resp: 18  Temp: 98.4 F (36.9 C)   Filed Weights   06/05/16 1052  Weight: 138 lb 8 oz (62.8 kg)    GENERAL:alert, no distress and comfortable SKIN: skin color, texture, turgor are normal, no rashes or significant lesions EYES: normal, Conjunctiva are pink and non-injected, sclera clear OROPHARYNX:no exudate, no erythema  and lips, buccal mucosa, and tongue normal  NECK: supple, thyroid normal size, non-tender, without nodularity LYMPH:  no palpable lymphadenopathy in the cervical, axillary or inguinal LUNGS: clear to auscultation and percussion with normal breathing effort HEART: regular rate & rhythm and no murmurs and no lower extremity edema ABDOMEN:abdomen  soft, non-tender and normal bowel sounds Musculoskeletal:no cyanosis of digits and no clubbing  NEURO: alert & oriented x 3 with fluent speech, no focal motor/sensory deficits  LABORATORY DATA:  I have reviewed the data as listed    Component Value Date/Time   NA 143 06/05/2016 1036   K 4.2 06/05/2016 1036   CL 104 02/08/2013 1454   CO2 30 (H) 06/05/2016 1036   GLUCOSE 103 06/05/2016 1036   GLUCOSE 103 (H) 02/08/2013 1454   BUN 20.2 06/05/2016 1036   CREATININE 0.9 06/05/2016 1036   CALCIUM 9.8 06/05/2016 1036   PROT 6.8 06/05/2016 1036   ALBUMIN 4.0 06/05/2016 1036   AST 18 06/05/2016 1036   ALT 17 06/05/2016 1036   ALKPHOS 81 06/05/2016 1036   BILITOT 0.76 06/05/2016 1036   GFRNONAA >60 03/24/2009 0350   GFRAA  03/24/2009 0350    >60        The eGFR has been calculated using the MDRD equation. This calculation has not been validated in all clinical situations. eGFR's persistently <60 mL/min signify possible Chronic Kidney Disease.    No results found for: SPEP, UPEP  Lab Results  Component Value Date   WBC 31.8 (H) 06/05/2016   NEUTROABS 4.6 06/05/2016   HGB 12.7 06/05/2016   HCT 38.3 06/05/2016   MCV 84.0 06/05/2016   PLT 143 (L) 06/05/2016      Chemistry      Component Value Date/Time   NA 143 06/05/2016 1036   K 4.2 06/05/2016 1036   CL 104 02/08/2013 1454   CO2 30 (H) 06/05/2016 1036   BUN 20.2 06/05/2016 1036   CREATININE 0.9 06/05/2016 1036      Component Value Date/Time   CALCIUM 9.8 06/05/2016 1036   ALKPHOS 81 06/05/2016 1036   AST 18 06/05/2016 1036   ALT 17 06/05/2016 1036   BILITOT 0.76 06/05/2016 1036      ASSESSMENT & PLAN:  CLL (chronic lymphocytic leukemia) Clinically, she remained in stage I disease. She has no symptoms to suggest disease progression. I recommend yearly visit with history, physical examination and blood work. I recommend yearly influenza vaccination and preventive care.    Thrombocytopenia ([place]) The cause  is unknown but could be related to CLL progression. It is mild and there is little change compared from previous platelet count. The patient denies recent history of bleeding such as epistaxis, hematuria or hematochezia. She is asymptomatic from the thrombocytopenia. I will observe for now.      Orders Placed This Encounter  Procedures  . CBC with Differential/Platelet    Standing Status:   Future    Standing Expiration Date:   07/10/2017  . Comprehensive metabolic panel    Standing Status:   Future    Standing Expiration Date:   07/10/2017  . Lactate dehydrogenase (LDH)    Standing Status:   Future    Standing Expiration Date:   06/05/2017   All questions were answered. The patient knows to call the clinic with any problems, questions or concerns. No barriers to learning was detected. I spent 10 minutes counseling the patient face to face. The total time spent in the appointment was 15 minutes and  more than 50% was on counseling and review of test results     [facility], Raesha Coonrod, MD 06/05/2016 5:32 PM

## 2016-06-05 NOTE — Telephone Encounter (Signed)
Gave pt cal & avs °

## 2016-06-06 DIAGNOSIS — D696 Thrombocytopenia, unspecified: Secondary | ICD-10-CM | POA: Insufficient documentation

## 2016-06-06 NOTE — Assessment & Plan Note (Signed)
Clinically, she remained in stage I disease. She has no symptoms to suggest disease progression. I recommend yearly visit with history, physical examination and blood work. I recommend yearly influenza vaccination and preventive care.   

## 2016-06-06 NOTE — Assessment & Plan Note (Signed)
The cause is unknown but could be related to CLL progression. It is mild and there is little change compared from previous platelet count. The patient denies recent history of bleeding such as epistaxis, hematuria or hematochezia. She is asymptomatic from the thrombocytopenia. I will observe for now.

## 2016-06-19 ENCOUNTER — Ambulatory Visit
Admission: RE | Admit: 2016-06-19 | Discharge: 2016-06-19 | Disposition: A | Discharge status: Home / Self Care | Payer: Medicare Other | Source: Ambulatory Visit | Attending: Family Medicine | Admitting: Family Medicine

## 2016-06-19 DIAGNOSIS — Z1231 Encounter for screening mammogram for malignant neoplasm of breast: Secondary | ICD-10-CM | POA: Diagnosis not present

## 2016-07-11 DIAGNOSIS — Z23 Encounter for immunization: Secondary | ICD-10-CM | POA: Diagnosis not present

## 2016-08-30 DIAGNOSIS — Z0000 Encounter for general adult medical examination without abnormal findings: Secondary | ICD-10-CM | POA: Diagnosis not present

## 2016-08-30 DIAGNOSIS — Z1389 Encounter for screening for other disorder: Secondary | ICD-10-CM | POA: Diagnosis not present

## 2016-08-30 DIAGNOSIS — Z79899 Other long term (current) drug therapy: Secondary | ICD-10-CM | POA: Diagnosis not present

## 2016-08-30 DIAGNOSIS — E782 Mixed hyperlipidemia: Secondary | ICD-10-CM | POA: Diagnosis not present

## 2016-08-30 DIAGNOSIS — J309 Allergic rhinitis, unspecified: Secondary | ICD-10-CM | POA: Diagnosis not present

## 2016-08-30 DIAGNOSIS — C9110 Chronic lymphocytic leukemia of B-cell type not having achieved remission: Secondary | ICD-10-CM | POA: Diagnosis not present

## 2016-08-30 DIAGNOSIS — I10 Essential (primary) hypertension: Secondary | ICD-10-CM | POA: Diagnosis not present

## 2016-08-30 DIAGNOSIS — M899 Disorder of bone, unspecified: Secondary | ICD-10-CM | POA: Diagnosis not present

## 2016-08-30 DIAGNOSIS — C4491 Basal cell carcinoma of skin, unspecified: Secondary | ICD-10-CM | POA: Diagnosis not present

## 2016-08-30 DIAGNOSIS — I480 Paroxysmal atrial fibrillation: Secondary | ICD-10-CM | POA: Diagnosis not present

## 2016-09-03 DIAGNOSIS — I10 Essential (primary) hypertension: Secondary | ICD-10-CM | POA: Diagnosis not present

## 2016-09-03 DIAGNOSIS — E784 Other hyperlipidemia: Secondary | ICD-10-CM | POA: Diagnosis not present

## 2016-09-03 DIAGNOSIS — I480 Paroxysmal atrial fibrillation: Secondary | ICD-10-CM | POA: Diagnosis not present

## 2017-03-12 DIAGNOSIS — H01024 Squamous blepharitis left upper eyelid: Secondary | ICD-10-CM | POA: Diagnosis not present

## 2017-03-12 DIAGNOSIS — H43813 Vitreous degeneration, bilateral: Secondary | ICD-10-CM | POA: Diagnosis not present

## 2017-03-12 DIAGNOSIS — H01021 Squamous blepharitis right upper eyelid: Secondary | ICD-10-CM | POA: Diagnosis not present

## 2017-03-12 DIAGNOSIS — H01025 Squamous blepharitis left lower eyelid: Secondary | ICD-10-CM | POA: Diagnosis not present

## 2017-03-12 DIAGNOSIS — H353131 Nonexudative age-related macular degeneration, bilateral, early dry stage: Secondary | ICD-10-CM | POA: Diagnosis not present

## 2017-03-12 DIAGNOSIS — H01022 Squamous blepharitis right lower eyelid: Secondary | ICD-10-CM | POA: Diagnosis not present

## 2017-03-12 DIAGNOSIS — H2513 Age-related nuclear cataract, bilateral: Secondary | ICD-10-CM | POA: Diagnosis not present

## 2017-05-21 DIAGNOSIS — R1030 Lower abdominal pain, unspecified: Secondary | ICD-10-CM | POA: Diagnosis not present

## 2017-05-22 ENCOUNTER — Other Ambulatory Visit: Payer: Self-pay | Admitting: Family Medicine

## 2017-05-22 ENCOUNTER — Ambulatory Visit
Admission: RE | Admit: 2017-05-22 | Discharge: 2017-05-22 | Disposition: A | Discharge status: Home / Self Care | Payer: Medicare Other | Source: Ambulatory Visit | Attending: Family Medicine | Admitting: Family Medicine

## 2017-05-22 DIAGNOSIS — K5730 Diverticulosis of large intestine without perforation or abscess without bleeding: Secondary | ICD-10-CM | POA: Diagnosis not present

## 2017-05-22 DIAGNOSIS — R1030 Lower abdominal pain, unspecified: Secondary | ICD-10-CM

## 2017-05-22 DIAGNOSIS — N281 Cyst of kidney, acquired: Secondary | ICD-10-CM | POA: Diagnosis not present

## 2017-05-22 DIAGNOSIS — K7689 Other specified diseases of liver: Secondary | ICD-10-CM | POA: Diagnosis not present

## 2017-05-22 MED ORDER — IOHEXOL 300 MG/ML  SOLN
30.0000 mL | Freq: Once | INTRAMUSCULAR | Status: AC | PRN
  Administered 2017-05-22: 30 mL via ORAL

## 2017-05-22 MED ORDER — IOPAMIDOL (ISOVUE-300) INJECTION 61%: 100.0000 mL | Freq: Once | INTRAVENOUS | Status: DC | PRN

## 2017-06-04 ENCOUNTER — Ambulatory Visit (HOSPITAL_BASED_OUTPATIENT_CLINIC_OR_DEPARTMENT_OTHER): Payer: Medicare Other | Admitting: Hematology and Oncology

## 2017-06-04 ENCOUNTER — Telehealth: Payer: Self-pay | Admitting: Hematology and Oncology

## 2017-06-04 ENCOUNTER — Other Ambulatory Visit (HOSPITAL_BASED_OUTPATIENT_CLINIC_OR_DEPARTMENT_OTHER): Payer: Medicare Other

## 2017-06-04 ENCOUNTER — Encounter: Payer: Self-pay | Admitting: Hematology and Oncology

## 2017-06-04 DIAGNOSIS — R591 Generalized enlarged lymph nodes: Secondary | ICD-10-CM

## 2017-06-04 DIAGNOSIS — C9110 Chronic lymphocytic leukemia of B-cell type not having achieved remission: Secondary | ICD-10-CM | POA: Diagnosis not present

## 2017-06-04 DIAGNOSIS — R599 Enlarged lymph nodes, unspecified: Secondary | ICD-10-CM

## 2017-06-04 LAB — COMPREHENSIVE METABOLIC PANEL
ALT: 19 U/L (ref 0–55)
ANION GAP: 9 meq/L (ref 3–11)
AST: 17 U/L (ref 5–34)
Albumin: 4.1 g/dL (ref 3.5–5.0)
Alkaline Phosphatase: 80 U/L (ref 40–150)
BILIRUBIN TOTAL: 0.89 mg/dL (ref 0.20–1.20)
BUN: 18.8 mg/dL (ref 7.0–26.0)
CHLORIDE: 105 meq/L (ref 98–109)
CO2: 29 meq/L (ref 22–29)
Calcium: 10 mg/dL (ref 8.4–10.4)
Creatinine: 0.9 mg/dL (ref 0.6–1.1)
EGFR: 61 mL/min/{1.73_m2} — AB (ref >90)
GLUCOSE: 101 mg/dL (ref 70–140)
POTASSIUM: 4.2 meq/L (ref 3.5–5.1)
SODIUM: 143 meq/L (ref 136–145)
Total Protein: 6.5 g/dL (ref 6.4–8.3)

## 2017-06-04 LAB — CBC WITH DIFFERENTIAL/PLATELET
BASO%: 0.2 % (ref 0.0–2.0)
BASOS ABS: 0.1 10*3/uL (ref 0.0–0.1)
EOS ABS: 0.1 10*3/uL (ref 0.0–0.5)
EOS%: 0.3 % (ref 0.0–7.0)
HCT: 39.3 % (ref 34.8–46.6)
HGB: 12.9 g/dL (ref 11.6–15.9)
LYMPH%: 87.2 % — ABNORMAL HIGH (ref 14.0–49.7)
MCH: 27.6 pg (ref 25.1–34.0)
MCHC: 32.8 g/dL (ref 31.5–36.0)
MCV: 84.3 fL (ref 79.5–101.0)
MONO#: 1 10*3/uL — AB (ref 0.1–0.9)
MONO%: 2.2 % (ref 0.0–14.0)
NEUT%: 10.1 % — AB (ref 38.4–76.8)
NEUTROS ABS: 4.5 10*3/uL (ref 1.5–6.5)
PLATELETS: 142 10*3/uL — AB (ref 145–400)
RBC: 4.67 10*6/uL (ref 3.70–5.45)
RDW: 14.6 % — ABNORMAL HIGH (ref 11.2–14.5)
WBC: 44.3 10*3/uL — AB (ref 3.9–10.3)
lymph#: 38.6 10*3/uL — ABNORMAL HIGH (ref 0.9–3.3)

## 2017-06-04 LAB — TECHNOLOGIST REVIEW

## 2017-06-04 LAB — LACTATE DEHYDROGENASE: LDH: 142 U/L (ref 125–245)

## 2017-06-04 NOTE — Assessment & Plan Note (Signed)
She has mild progression of abdominal lymphadenopathy For now, recommend observation only and see her back in 2 months for further assessment

## 2017-06-04 NOTE — Assessment & Plan Note (Signed)
I am attempting to find copies of her CLL FISH prognostic marker from October 2014 Her only main symptom is abdominal discomfort but she has not noticed any changes in bowel habits, nausea, anorexia or abnormal weight loss The pain comes and goes, not enough to warrant pain medication I recommend observation for 2 more months and see her back by the end of September She has a trip planned for middle of September The patient may be a candidate for ibrutinib or systemic chemo

## 2017-06-04 NOTE — Patient Instructions (Signed)
Ibrutinib capsules What is this medicine? IBRUTINIB (eye BROO ti nib) is a medicine that targets proteins in cancer cells and stops the cancer cells from growing. It is used to treat mantle cell lymphoma, chronic lymphocytic leukemia, small lymphocytic lymphoma, marginal zone lymphoma, Waldenstrom macroglobulinemia, and chronic graft-versus-host disease. This medicine may be used for other purposes; ask your health care provider or pharmacist if you have questions. COMMON BRAND NAME(S): IMBRUVICA What should I tell my health care provider before I take this medicine? They need to know if you have any of these conditions: -bleeding disorders -diabetes -heart disease -high blood pressure -high cholesterol -history of irregular heartbeat -infection -liver disease -recent surgery -smoke tobacco -take medicines that treat or prevent blood clots -an unusual or allergic reaction to ibrutinib, other medicines, foods, dyes, or preservatives -pregnant or trying to get pregnant -breast-feeding How should I use this medicine? Take this medicine by mouth with a glass of water. Follow the directions on the prescription label. Do not cut, crush or chew this medicine. Do not take with grapefruit juice or eat [place] oranges. Take your medicine at regular intervals. Do not take it more often than directed. Do not stop taking except on your doctor's advice. Talk to your pediatrician regarding the use of this medicine in children. Special care may be needed. Overdosage: If you think you have taken too much of this medicine contact a poison control center or emergency room at once. NOTE: This medicine is only for you. Do not share this medicine with others. What if I miss a dose? If you miss a dose, take it as soon as you can. If it is almost time for your next dose, take only that dose. Do not take double or extra doses. What may interact with this medicine? This medicine may interact with the following  medications: -antiviral medications for HIV or AIDS -aprepitant -boceprevir -calcium channel blockers like diltiazem and verapamil -certain antibiotics like clarithromycin, erythromycin, and troleandomycin -certain medicines for fungal infections like fluconazole, ketoconazole, itraconazole, posaconazole, and voriconazole -certain medicines for seizures like carbamazepine and phenytoin -cimetidine -ciprofloxacin -clotrimazole -conivaptan -crizotinib -cyclosporine -digoxin -dronedarone -enzalutamide -fluvoxamine -grapefruit juice or [place] oranges -idelalisib -imatinib -methotrexate -mitotane -nefazodone -rifampin -St. John's wort -tofisopam This list may not describe all possible interactions. Give your health care provider a list of all the medicines, herbs, non-prescription drugs, or dietary supplements you use. Also tell them if you smoke, drink alcohol, or use illegal drugs. Some items may interact with your medicine. What should I watch for while using this medicine? You may need blood work done while you are taking this medicine. This medicine may increase your risk to bruise or bleed. Call your doctor or health care professional if you notice any unusual bleeding. Call your doctor or health care professional for advice if you get a fever, chills or sore throat, or other symptoms of a cold or flu. Do not treat yourself. This drug decreases your body's ability to fight infections. Try to avoid being around people who are sick. If you are going to have surgery or any other procedures, tell your doctor you are taking this medicine. Tell your dentist and dental surgeon that you are taking this medicine. You should not have major dental surgery while on this medicine. See your dentist to have a dental exam and fix any dental problems before starting this medicine. Talk to your doctor about your risk of cancer. You may be more at risk for certain types of cancers   if you take this  medicine. Do not become pregnant while taking this medicine or for 1 month after stopping it. Women should inform their doctor if they wish to become pregnant or think they might be pregnant. Men should not father a child while taking this medicine and for 1 month after stopping it. There is a potential for serious side effects to an unborn child. Talk to your health care professional or pharmacist for more information. What side effects may I notice from receiving this medicine? Side effects that you should report to your doctor or health care professional as soon as possible: -allergic reactions like skin rash, itching or hives, swelling of the face, lips, or tongue -low blood counts - this medicine may decrease the number of white blood cells, red blood cells and platelets. You may be at increased risk for infections and bleeding -signs or symptoms of bleeding such as bloody or black, tarry stools; red or dark-brown urine; spitting up blood or brown material that looks like coffee grounds; red spots on the skin; unusual bruising or bleeding from the eye, gums, or nose; confusion; trouble speaking or understanding; severe headaches; weakness; or dizziness -signs and symptoms of a dangerous change in heartbeat or heart rhythm like chest pain; dizziness; fast or irregular heartbeat; palpitations; feeling faint or lightheaded, falls; breathing problems -signs and symptoms of infection like fever or chills; cough; sore throat; or pain when urinating -signs and symptoms of kidney injury like trouble passing urine or change in the amount of urine Side effects that usually do not require medical attention (report to your doctor or health care professional if they continue or are bothersome): -bone pain -diarrhea -mouth sores -muscle cramps -muscle pain -nausea -tiredness This list may not describe all possible side effects. Call your doctor for medical advice about side effects. You may report side  effects to FDA at 1-800-FDA-1088. Where should I keep my medicine? Keep out of the reach of children. Store between 20 and 25 degrees C (68 and 77 degrees F). Keep this medicine in the original container. Throw away any unused medicine after the expiration date. NOTE: This sheet is a summary. It may not cover all possible information. If you have questions about this medicine, talk to your doctor, pharmacist, or health care provider.  2018 Elsevier/Gold Standard (2016-09-12 18:48:51)  

## 2017-06-04 NOTE — Progress Notes (Signed)
[facility] OFFICE PROGRESS NOTE  Patient Care Team: Cari Caraway, MD as PCP - General (Family Medicine) Irine Seal, MD as Attending Physician (Urology) Jacolyn Reedy, MD as Attending Physician (Cardiology) Heath Lark, MD as Consulting Physician (Hematology and Oncology)  SUMMARY OF ONCOLOGIC HISTORY:   CLL (chronic lymphocytic leukemia) ([place])   02/09/2013 Pathology Results    Peripheral Blood Flow Cytometry - FINDINGS CONSISTENT WITH CHRONIC LYMPHOCYTIC LEUKEMIA      05/22/2017 Imaging    Worsening mesenteric and retroperitoneal adenopathy concerning for worsening lymphoproliferative disorder/lymphoma.  Small cysts in the liver and kidneys are stable.  Prior cholecystectomy.  Aortic atherosclerosis.  Left colonic diverticulosis without active diverticulitis.       INTERVAL HISTORY: Please see below for problem oriented charting. She returns for further follow-up Her husband recently passed away She is coping well She denies new palpable lymphadenopathy but has complain of abdominal discomfort Her primary care doctor has ordered a CT imaging study Her abdominal discomfort comes and goes and it is not enough to call it painful and she does not take pain medicine She denies nausea or changes in bowel habits Denies recent infection  REVIEW OF SYSTEMS:   Constitutional: Denies fevers, chills or abnormal weight loss Eyes: Denies blurriness of vision Ears, nose, mouth, throat, and face: Denies mucositis or sore throat Respiratory: Denies cough, dyspnea or wheezes Cardiovascular: Denies palpitation, chest discomfort or lower extremity swelling Gastrointestinal:  Denies nausea, heartburn or change in bowel habits Skin: Denies abnormal skin rashes Neurological:Denies numbness, tingling or new weaknesses Behavioral/Psych: Mood is stable, no new changes  All other systems were reviewed with the patient and are negative.  I have reviewed the past  medical history, past surgical history, social history and family history with the patient and they are unchanged from previous note.  ALLERGIES:  is allergic to codeine; demerol [meperidine]; morphine and related; ciprofloxacin; and sulfa antibiotics.  MEDICATIONS:  Current Outpatient Prescriptions  Medication Sig Dispense Refill  . acetaminophen (TYLENOL) 500 MG tablet Take 500 mg by mouth every 6 (six) hours as needed for pain.    Marland Kitchen aspirin 81 MG tablet Take 81 mg by mouth daily.    Marland Kitchen atorvastatin (LIPITOR) 40 MG tablet Take 40 mg by mouth daily.    . Calcium Carb-Cholecalciferol (CALCIUM + D3) 600-200 MG-UNIT TABS Take 1 tablet by mouth 3 (three) times a week.     Marland Kitchen DIGOX 0.125 MG tablet Take 0.125 mg by mouth daily.    . fluticasone (FLONASE) 50 MCG/ACT nasal spray Place 2 sprays into the nose daily.    . folic acid (FOLVITE) 1 MG tablet Take 1 mg by mouth daily.    . hydrochlorothiazide (HYDRODIURIL) 12.5 MG tablet Take 12.5 mg by mouth daily.    Marland Kitchen ketotifen (ZADITOR) 0.025 % ophthalmic solution Apply 1 drop to eye 2 (two) times daily.    . metoprolol (LOPRESSOR) 50 MG tablet Take 50 mg by mouth 2 (two) times daily.    . Multiple Vitamin (MULTIVITAMIN) tablet Take 1 tablet by mouth daily.    . Multiple Vitamins-Minerals (EYE VITAMINS & MINERALS) TABS Take by mouth.    . potassium chloride SA (K-DUR,KLOR-CON) 20 MEQ tablet Take 20 mEq by mouth 3 (three) times a week. Mon, Wed, Fri     No current facility-administered medications for this visit.     PHYSICAL EXAMINATION: ECOG PERFORMANCE STATUS: 1 - Symptomatic but completely ambulatory  Vitals:   06/04/17 1126  BP: (!) 123/57  Pulse: (!) 55  Resp: 18  Temp: 98.5 F (36.9 C)   Filed Weights   06/04/17 1126  Weight: 143 lb 1.6 oz (64.9 kg)    GENERAL:alert, no distress and comfortable SKIN: skin color, texture, turgor are normal, no rashes or significant lesions EYES: normal, Conjunctiva are pink and non-injected, sclera  clear OROPHARYNX:no exudate, no erythema and lips, buccal mucosa, and tongue normal  NECK: supple, thyroid normal size, non-tender, without nodularity LYMPH: She has palpable lymphadenopathy in her neck  LUNGS: clear to auscultation and percussion with normal breathing effort HEART: regular rate & rhythm and no murmurs and no lower extremity edema ABDOMEN:abdomen soft, mild discomfort on examination in the periumbilical region Musculoskeletal:no cyanosis of digits and no clubbing  NEURO: alert & oriented x 3 with fluent speech, no focal motor/sensory deficits  LABORATORY DATA:  I have reviewed the data as listed    Component Value Date/Time   NA 143 06/04/2017 1036   K 4.2 06/04/2017 1036   CL 104 02/08/2013 1454   CO2 29 06/04/2017 1036   GLUCOSE 101 06/04/2017 1036   GLUCOSE 103 (H) 02/08/2013 1454   BUN 18.8 06/04/2017 1036   CREATININE 0.9 06/04/2017 1036   CALCIUM 10.0 06/04/2017 1036   PROT 6.5 06/04/2017 1036   ALBUMIN 4.1 06/04/2017 1036   AST 17 06/04/2017 1036   ALT 19 06/04/2017 1036   ALKPHOS 80 06/04/2017 1036   BILITOT 0.89 06/04/2017 1036   GFRNONAA >60 03/24/2009 0350   GFRAA  03/24/2009 0350    >60        The eGFR has been calculated using the MDRD equation. This calculation has not been validated in all clinical situations. eGFR's persistently <60 mL/min signify possible Chronic Kidney Disease.    No results found for: SPEP, UPEP  Lab Results  Component Value Date   WBC 44.3 (H) 06/04/2017   NEUTROABS 4.5 06/04/2017   HGB 12.9 06/04/2017   HCT 39.3 06/04/2017   MCV 84.3 06/04/2017   PLT 142 (L) 06/04/2017      Chemistry      Component Value Date/Time   NA 143 06/04/2017 1036   K 4.2 06/04/2017 1036   CL 104 02/08/2013 1454   CO2 29 06/04/2017 1036   BUN 18.8 06/04/2017 1036   CREATININE 0.9 06/04/2017 1036      Component Value Date/Time   CALCIUM 10.0 06/04/2017 1036   ALKPHOS 80 06/04/2017 1036   AST 17 06/04/2017 1036   ALT 19  06/04/2017 1036   BILITOT 0.89 06/04/2017 1036       RADIOGRAPHIC STUDIES: I have reviewed the imaging study with the patient I have personally reviewed the radiological images as listed and agreed with the findings in the report. Ct Abdomen Pelvis W Contrast  Result Date: 05/22/2017 CLINICAL DATA:  Right lower quadrant pain for 6 weeks EXAM: CT ABDOMEN AND PELVIS WITH CONTRAST TECHNIQUE: Multidetector CT imaging of the abdomen and pelvis was performed using the standard protocol following bolus administration of intravenous contrast. CONTRAST:  24m OMNIPAQUE IOHEXOL 300 MG/ML  SOLN COMPARISON:  09/03/2013 FINDINGS: Lower chest: Lung bases are clear. No effusions. Heart is normal size. Hepatobiliary: Small cysts scattered throughout the liver. Prior cholecystectomy. Common bile duct mildly dilated at 11 mm, stable compatible with post cholecystectomy state. Pancreas: No focal abnormality or ductal dilatation. Spleen: No focal abnormality.  Normal size. Adrenals/Urinary Tract: Small cysts in the kidneys bilaterally. No hydronephrosis. Adrenal glands and urinary bladder are unremarkable. Stomach/Bowel: Scattered descending  colonic and sigmoid diverticula. No active diverticulitis. Stomach and small bowel decompressed. Vascular/Lymphatic: There is retroperitoneal and mesenteric adenopathy. Mesenteric adenopathy is increased since prior study. Index Central mesenteric lymph node has a short axis diameter of 2.5 cm compared with 1.5 cm previously. Second index mesenteric lymph node on image 51 has a short axis diameter of 1.8 cm compared to 1.0 cm previously. Left periaortic lymph node has a short axis diameter of 12 mm on image 32 compared with 9 mm previously. Left pelvic side wall lymph node has a short axis diameter of 1.5 cm compared to 1.2 cm previously. No aneurysm. Diffuse aortic atherosclerosis. Reproductive: Prior hysterectomy.  No adnexal masses. Other: No free fluid or free air. Musculoskeletal: No  acute bony abnormality. Degenerative disc and facet disease in the lumbar spine. Severe leftward scoliosis. IMPRESSION: Worsening mesenteric and retroperitoneal adenopathy concerning for worsening lymphoproliferative disorder/lymphoma. Small cysts in the liver and kidneys are stable. Prior cholecystectomy. Aortic atherosclerosis. Left colonic diverticulosis without active diverticulitis. Electronically Signed   By: Rolm Baptise M.D.   On: 05/22/2017 12:23    ASSESSMENT & PLAN:  CLL (chronic lymphocytic leukemia) I am attempting to find copies of her CLL FISH prognostic marker from October 2014 Her only main symptom is abdominal discomfort but she has not noticed any changes in bowel habits, nausea, anorexia or abnormal weight loss The pain comes and goes, not enough to warrant pain medication I recommend observation for 2 more months and see her back by the end of September She has a trip planned for middle of September The patient may be a candidate for ibrutinib or systemic chemo   Adenopathy She has mild progression of abdominal lymphadenopathy For now, recommend observation only and see her back in 2 months for further assessment   No orders of the defined types were placed in this encounter.  All questions were answered. The patient knows to call the clinic with any problems, questions or concerns. No barriers to learning was detected. I spent 25 minutes counseling the patient face to face. The total time spent in the appointment was 30 minutes and more than 50% was on counseling and review of test results     Heath Lark, MD 06/04/2017 4:22 PM

## 2017-06-04 NOTE — Telephone Encounter (Signed)
Gave patient avs report and appointments for September.  °

## 2017-06-09 ENCOUNTER — Other Ambulatory Visit: Payer: Self-pay | Admitting: Family Medicine

## 2017-06-09 DIAGNOSIS — Z1231 Encounter for screening mammogram for malignant neoplasm of breast: Secondary | ICD-10-CM

## 2017-06-13 DIAGNOSIS — N390 Urinary tract infection, site not specified: Secondary | ICD-10-CM | POA: Diagnosis not present

## 2017-06-13 DIAGNOSIS — R319 Hematuria, unspecified: Secondary | ICD-10-CM | POA: Diagnosis not present

## 2017-06-23 ENCOUNTER — Ambulatory Visit
Admission: RE | Admit: 2017-06-23 | Discharge: 2017-06-23 | Disposition: A | Discharge status: Home / Self Care | Payer: Medicare Other | Source: Ambulatory Visit | Attending: Family Medicine | Admitting: Family Medicine

## 2017-06-23 DIAGNOSIS — Z1231 Encounter for screening mammogram for malignant neoplasm of breast: Secondary | ICD-10-CM | POA: Diagnosis not present

## 2017-06-24 ENCOUNTER — Other Ambulatory Visit: Payer: Self-pay | Admitting: Family Medicine

## 2017-06-24 DIAGNOSIS — R928 Other abnormal and inconclusive findings on diagnostic imaging of breast: Secondary | ICD-10-CM

## 2017-06-30 ENCOUNTER — Other Ambulatory Visit: Payer: Medicare Other

## 2017-07-03 ENCOUNTER — Ambulatory Visit
Admission: RE | Admit: 2017-07-03 | Discharge: 2017-07-03 | Disposition: A | Discharge status: Home / Self Care | Payer: Medicare Other | Source: Ambulatory Visit | Attending: Family Medicine | Admitting: Family Medicine

## 2017-07-03 ENCOUNTER — Other Ambulatory Visit: Payer: Self-pay | Admitting: Family Medicine

## 2017-07-03 DIAGNOSIS — N6489 Other specified disorders of breast: Secondary | ICD-10-CM | POA: Diagnosis not present

## 2017-07-03 DIAGNOSIS — R928 Other abnormal and inconclusive findings on diagnostic imaging of breast: Secondary | ICD-10-CM

## 2017-07-23 ENCOUNTER — Other Ambulatory Visit: Payer: Self-pay | Admitting: Hematology and Oncology

## 2017-07-23 DIAGNOSIS — C9110 Chronic lymphocytic leukemia of B-cell type not having achieved remission: Secondary | ICD-10-CM

## 2017-08-04 ENCOUNTER — Ambulatory Visit (HOSPITAL_BASED_OUTPATIENT_CLINIC_OR_DEPARTMENT_OTHER): Payer: Medicare Other | Admitting: Hematology and Oncology

## 2017-08-04 ENCOUNTER — Other Ambulatory Visit (HOSPITAL_BASED_OUTPATIENT_CLINIC_OR_DEPARTMENT_OTHER): Payer: Medicare Other

## 2017-08-04 ENCOUNTER — Telehealth: Payer: Self-pay | Admitting: Hematology and Oncology

## 2017-08-04 VITALS — BP 136/59 | HR 61 | Temp 98.8°F | Resp 17 | Ht 61.75 in | Wt 143.7 lb

## 2017-08-04 DIAGNOSIS — C9190 Lymphoid leukemia, unspecified not having achieved remission: Secondary | ICD-10-CM

## 2017-08-04 DIAGNOSIS — C9110 Chronic lymphocytic leukemia of B-cell type not having achieved remission: Secondary | ICD-10-CM | POA: Diagnosis present

## 2017-08-04 DIAGNOSIS — Z23 Encounter for immunization: Secondary | ICD-10-CM

## 2017-08-04 DIAGNOSIS — Z7189 Other specified counseling: Secondary | ICD-10-CM | POA: Diagnosis not present

## 2017-08-04 LAB — COMPREHENSIVE METABOLIC PANEL
ALBUMIN: 4.3 g/dL (ref 3.5–5.0)
ALK PHOS: 78 U/L (ref 40–150)
ALT: 29 U/L (ref 0–55)
AST: 27 U/L (ref 5–34)
Anion Gap: 9 mEq/L (ref 3–11)
BILIRUBIN TOTAL: 0.81 mg/dL (ref 0.20–1.20)
BUN: 18.4 mg/dL (ref 7.0–26.0)
CALCIUM: 10.3 mg/dL (ref 8.4–10.4)
CO2: 29 mEq/L (ref 22–29)
Chloride: 104 mEq/L (ref 98–109)
Creatinine: 0.9 mg/dL (ref 0.6–1.1)
EGFR: 66 mL/min/{1.73_m2} — AB (ref >90)
GLUCOSE: 95 mg/dL (ref 70–140)
POTASSIUM: 4 meq/L (ref 3.5–5.1)
Sodium: 142 mEq/L (ref 136–145)
TOTAL PROTEIN: 7 g/dL (ref 6.4–8.3)

## 2017-08-04 LAB — CBC WITH DIFFERENTIAL/PLATELET
BASO%: 0.2 % (ref 0.0–2.0)
Basophils Absolute: 0.1 10*3/uL (ref 0.0–0.1)
EOS%: 0.2 % (ref 0.0–7.0)
Eosinophils Absolute: 0.1 10*3/uL (ref 0.0–0.5)
HCT: 41.8 % (ref 34.8–46.6)
HGB: 13.7 g/dL (ref 11.6–15.9)
LYMPH#: 43.2 10*3/uL — AB (ref 0.9–3.3)
LYMPH%: 86.4 % — AB (ref 14.0–49.7)
MCH: 28.4 pg (ref 25.1–34.0)
MCHC: 32.8 g/dL (ref 31.5–36.0)
MCV: 86.5 fL (ref 79.5–101.0)
MONO#: 0.8 10*3/uL (ref 0.1–0.9)
MONO%: 1.6 % (ref 0.0–14.0)
NEUT%: 11.6 % — AB (ref 38.4–76.8)
NEUTROS ABS: 5.8 10*3/uL (ref 1.5–6.5)
PLATELETS: 135 10*3/uL — AB (ref 145–400)
RBC: 4.83 10*6/uL (ref 3.70–5.45)
RDW: 13.8 % (ref 11.2–14.5)
WBC: 50 10*3/uL — AB (ref 3.9–10.3)

## 2017-08-04 LAB — LACTATE DEHYDROGENASE: LDH: 156 U/L (ref 125–245)

## 2017-08-04 LAB — TECHNOLOGIST REVIEW

## 2017-08-04 LAB — URIC ACID: URIC ACID, SERUM: 5 mg/dL (ref 2.6–7.4)

## 2017-08-04 MED ORDER — ALLOPURINOL 300 MG PO TABS: 300.0000 mg | ORAL_TABLET | Freq: Every day | ORAL | 3 refills | Status: DC

## 2017-08-04 MED ORDER — ACYCLOVIR 400 MG PO TABS: 400.0000 mg | ORAL_TABLET | Freq: Every day | ORAL | 3 refills | Status: DC

## 2017-08-04 MED ORDER — PROCHLORPERAZINE MALEATE 10 MG PO TABS: 10.0000 mg | ORAL_TABLET | Freq: Four times a day (QID) | ORAL | 1 refills | Status: DC | PRN

## 2017-08-04 MED ORDER — INFLUENZA VAC SPLIT QUAD 0.5 ML IM SUSY
0.5000 mL | PREFILLED_SYRINGE | Freq: Once | INTRAMUSCULAR | Status: AC
  Administered 2017-08-04: 0.5 mL via INTRAMUSCULAR
  Filled 2017-08-04: qty 0.5

## 2017-08-04 MED ORDER — DEXAMETHASONE 4 MG PO TABS: 4.0000 mg | ORAL_TABLET | Freq: Every day | ORAL | 1 refills | Status: DC

## 2017-08-04 MED ORDER — LIDOCAINE-PRILOCAINE 2.5-2.5 % EX CREA: TOPICAL_CREAM | CUTANEOUS | 3 refills | Status: DC

## 2017-08-04 MED ORDER — ONDANSETRON HCL 8 MG PO TABS: 8.0000 mg | ORAL_TABLET | Freq: Three times a day (TID) | ORAL | 1 refills | Status: DC | PRN

## 2017-08-04 NOTE — Progress Notes (Signed)
START ON PATHWAY REGIMEN - Lymphoma and CLL     A cycle is every 28 days:     Bendamustine      Rituximab      Rituximab   **Always confirm dose/schedule in your pharmacy ordering system**    Patient Characteristics: Small Lymphocytic Leukemia (SLL), First Line, Treatment Indicated, 17p del (-), Fit Patient, Age ? 63 Disease Type: Small Lymphocytic Leukemia (SLL) Disease Type: Not Applicable [place] Stage: IV Line of therapy: First Line Treatment Indicated<= Treatment Indicated 17p Deletion Status: Negative Fit or Frail Patient<= Fit Patient Patient Age: Age ? 37 Intent of Therapy: Non-Curative / Palliative Intent, Discussed with Patient

## 2017-08-04 NOTE — Patient Instructions (Signed)
Bendamustine Injection What is this medicine? BENDAMUSTINE (BEN da MUS teen) is a chemotherapy drug. It is used to treat chronic lymphocytic leukemia and non-Hodgkin lymphoma. This medicine may be used for other purposes; ask your health care provider or pharmacist if you have questions. COMMON BRAND NAME(S): BENDEKA, Treanda What should I tell my health care provider before I take this medicine? They need to know if you have any of these conditions: -infection (especially a virus infection such as chickenpox, cold sores, or herpes) -kidney disease -liver disease -an unusual or allergic reaction to bendamustine, mannitol, other medicines, foods, dyes, or preservatives -pregnant or trying to get pregnant -breast-feeding How should I use this medicine? This medicine is for infusion into a vein. It is given by a health care professional in a hospital or clinic setting. Talk to your pediatrician regarding the use of this medicine in children. Special care may be needed. Overdosage: If you think you have taken too much of this medicine contact a poison control center or emergency room at once. NOTE: This medicine is only for you. Do not share this medicine with others. What if I miss a dose? It is important not to miss your dose. Call your doctor or health care professional if you are unable to keep an appointment. What may interact with this medicine? Do not take this medicine with any of the following medications: -clozapine This medicine may also interact with the following medications: -atazanavir -cimetidine -ciprofloxacin -enoxacin -fluvoxamine -medicines for seizures like carbamazepine and phenobarbital -mexiletine -rifampin -tacrine -thiabendazole -zileuton This list may not describe all possible interactions. Give your health care provider a list of all the medicines, herbs, non-prescription drugs, or dietary supplements you use. Also tell them if you smoke, drink alcohol, or  use illegal drugs. Some items may interact with your medicine. What should I watch for while using this medicine? This drug may make you feel generally unwell. This is not uncommon, as chemotherapy can affect healthy cells as well as cancer cells. Report any side effects. Continue your course of treatment even though you feel ill unless your doctor tells you to stop. You may need blood work done while you are taking this medicine. Call your doctor or health care professional for advice if you get a fever, chills or sore throat, or other symptoms of a cold or flu. Do not treat yourself. This drug decreases your body's ability to fight infections. Try to avoid being around people who are sick. This medicine may increase your risk to bruise or bleed. Call your doctor or health care professional if you notice any unusual bleeding. Talk to your doctor about your risk of cancer. You may be more at risk for certain types of cancers if you take this medicine. Do not become pregnant while taking this medicine or for 3 months after stopping it. Women should inform their doctor if they wish to become pregnant or think they might be pregnant. Men should not father a child while taking this medicine and for 3 months after stopping it.There is a potential for serious side effects to an unborn child. Talk to your health care professional or pharmacist for more information. Do not breast-feed an infant while taking this medicine. This medicine may interfere with the ability to have a child. You should talk with your doctor or health care professional if you are concerned about your fertility. What side effects may I notice from receiving this medicine? Side effects that you should report to your doctor   or health care professional as soon as possible: -allergic reactions like skin rash, itching or hives, swelling of the face, lips, or tongue -low blood counts - this medicine may decrease the number of white blood cells,  red blood cells and platelets. You may be at increased risk for infections and bleeding. -redness, blistering, peeling or loosening of the skin, including inside the mouth -signs of infection - fever or chills, cough, sore throat, pain or difficulty passing urine -signs of decreased platelets or bleeding - bruising, pinpoint red spots on the skin, black, tarry stools, blood in the urine -signs of decreased red blood cells - unusually weak or tired, fainting spells, lightheadedness -signs and symptoms of kidney injury like trouble passing urine or change in the amount of urine -signs and symptoms of liver injury like dark yellow or brown urine; general ill feeling or flu-like symptoms; light-colored stools; loss of appetite; nausea; right upper belly pain; unusually weak or tired; yellowing of the eyes or skin Side effects that usually do not require medical attention (report to your doctor or health care professional if they continue or are bothersome): -constipation -decreased appetite -diarrhea -headache -mouth sores -nausea/vomiting -tiredness This list may not describe all possible side effects. Call your doctor for medical advice about side effects. You may report side effects to FDA at 1-800-FDA-1088. Where should I keep my medicine? This drug is given in a hospital or clinic and will not be stored at home. NOTE: This sheet is a summary. It may not cover all possible information. If you have questions about this medicine, talk to your doctor, pharmacist, or health care provider.  2018 Elsevier/Gold Standard (2015-08-31 08:45:41) Rituximab injection What is this medicine? RITUXIMAB (ri TUX i mab) is a monoclonal antibody. It is used to treat certain types of cancer like non-Hodgkin lymphoma and chronic lymphocytic leukemia. It is also used to treat rheumatoid arthritis, granulomatosis with polyangiitis (or Wegener's granulomatosis), and microscopic polyangiitis. This medicine may be used  for other purposes; ask your health care provider or pharmacist if you have questions. COMMON BRAND NAME(S): Rituxan What should I tell my health care provider before I take this medicine? They need to know if you have any of these conditions: -heart disease -infection (especially a virus infection such as hepatitis B, chickenpox, cold sores, or herpes) -immune system problems -irregular heartbeat -kidney disease -lung or breathing disease, like asthma -recently received or scheduled to receive a vaccine -an unusual or allergic reaction to rituximab, mouse proteins, other medicines, foods, dyes, or preservatives -pregnant or trying to get pregnant -breast-feeding How should I use this medicine? This medicine is for infusion into a vein. It is administered in a hospital or clinic by a specially trained health care professional. A special MedGuide will be given to you by the pharmacist with each prescription and refill. Be sure to read this information carefully each time. Talk to your pediatrician regarding the use of this medicine in children. This medicine is not approved for use in children. Overdosage: If you think you have taken too much of this medicine contact a poison control center or emergency room at once. NOTE: This medicine is only for you. Do not share this medicine with others. What if I miss a dose? It is important not to miss a dose. Call your doctor or health care professional if you are unable to keep an appointment. What may interact with this medicine? -cisplatin -other medicines for arthritis like disease modifying antirheumatic drugs or tumor necrosis   factor inhibitors -live virus vaccines This list may not describe all possible interactions. Give your health care provider a list of all the medicines, herbs, non-prescription drugs, or dietary supplements you use. Also tell them if you smoke, drink alcohol, or use illegal drugs. Some items may interact with your  medicine. What should I watch for while using this medicine? Your condition will be monitored carefully while you are receiving this medicine. You may need blood work done while you are taking this medicine. This medicine can cause serious allergic reactions. To reduce your risk you may need to take medicine before treatment with this medicine. Take your medicine as directed. In some patients, this medicine may cause a serious brain infection that may cause death. If you have any problems seeing, thinking, speaking, walking, or standing, tell your doctor right away. If you cannot reach your doctor, urgently seek other source of medical care. Call your doctor or health care professional for advice if you get a fever, chills or sore throat, or other symptoms of a cold or flu. Do not treat yourself. This drug decreases your body's ability to fight infections. Try to avoid being around people who are sick. Do not become pregnant while taking this medicine or for 12 months after stopping it. Women should inform their doctor if they wish to become pregnant or think they might be pregnant. There is a potential for serious side effects to an unborn child. Talk to your health care professional or pharmacist for more information. What side effects may I notice from receiving this medicine? Side effects that you should report to your doctor or health care professional as soon as possible: -breathing problems -chest pain -dizziness or feeling faint -fast, irregular heartbeat -low blood counts - this medicine may decrease the number of white blood cells, red blood cells and platelets. You may be at increased risk for infections and bleeding. -mouth sores -redness, blistering, peeling or loosening of the skin, including inside the mouth (this can be added for any serious or exfoliative rash that could lead to hospitalization) -signs of infection - fever or chills, cough, sore throat, pain or difficulty passing  urine -signs and symptoms of kidney injury like trouble passing urine or change in the amount of urine -signs and symptoms of liver injury like dark yellow or brown urine; general ill feeling or flu-like symptoms; light-colored stools; loss of appetite; nausea; right upper belly pain; unusually weak or tired; yellowing of the eyes or skin -stomach pain -vomiting Side effects that usually do not require medical attention (report to your doctor or health care professional if they continue or are bothersome): -headache -joint pain -muscle cramps or muscle pain This list may not describe all possible side effects. Call your doctor for medical advice about side effects. You may report side effects to FDA at 1-800-FDA-1088. Where should I keep my medicine? This drug is given in a hospital or clinic and will not be stored at home. NOTE: This sheet is a summary. It may not cover all possible information. If you have questions about this medicine, talk to your doctor, pharmacist, or health care provider.  2018 Elsevier/Gold Standard (2016-06-05 15:28:09)  

## 2017-08-04 NOTE — Telephone Encounter (Signed)
Scheduled appt per 9/24 los - Gave patient AVS  And calender per los.

## 2017-08-05 LAB — HEPATITIS B SURFACE ANTIBODY,QUALITATIVE: Hep B Surface Ab, Qual: REACTIVE

## 2017-08-05 LAB — HEPATITIS B SURFACE ANTIGEN: HEP B S AG: NEGATIVE

## 2017-08-05 LAB — HEPATITIS B CORE ANTIBODY, IGM: Hep B Core Ab, IgM: NEGATIVE

## 2017-08-06 ENCOUNTER — Encounter: Payer: Self-pay | Admitting: Hematology and Oncology

## 2017-08-06 ENCOUNTER — Telehealth: Payer: Self-pay | Admitting: *Deleted

## 2017-08-06 DIAGNOSIS — Z7189 Other specified counseling: Secondary | ICD-10-CM | POA: Insufficient documentation

## 2017-08-06 NOTE — Assessment & Plan Note (Signed)
The patient is aware she has incurable disease and treatment is strictly palliative. We discussed importance of Advanced Directives and Living will. 

## 2017-08-06 NOTE — Telephone Encounter (Signed)
-----   Message from Heath Lark, MD sent at 08/06/2017  9:47 AM EDT ----- Regarding: labs by IR Can you get IR to draw CBC and CMP on the day of port placement? Thanks

## 2017-08-06 NOTE — Progress Notes (Signed)
[facility] OFFICE PROGRESS NOTE  Patient Care Team: Cari Caraway, MD as PCP - General (Family Medicine) Irine Seal, MD as Attending Physician (Urology) Jacolyn Reedy, MD as Attending Physician (Cardiology) Heath Lark, MD as Consulting Physician (Hematology and Oncology)  SUMMARY OF ONCOLOGIC HISTORY: Oncology History   Del 13q     CLL (chronic lymphocytic leukemia) ([place])   02/09/2013 Pathology Results    Peripheral Blood Flow Cytometry - FINDINGS CONSISTENT WITH CHRONIC LYMPHOCYTIC LEUKEMIA      09/02/2013 Pathology Results    FISH positive for deletion 13 q      05/22/2017 Imaging    Worsening mesenteric and retroperitoneal adenopathy concerning for worsening lymphoproliferative disorder/lymphoma.  Small cysts in the liver and kidneys are stable.  Prior cholecystectomy.  Aortic atherosclerosis.  Left colonic diverticulosis without active diverticulitis.       INTERVAL HISTORY: Please see below for problem oriented charting. She returns for further follow-up She complain of progressive intermittent discomfort in the right lower quadrant region that comes and goes She denies changes in bowel habits No recent infection The patient denies any recent signs or symptoms of bleeding such as spontaneous epistaxis, hematuria or hematochezia.  REVIEW OF SYSTEMS:   Constitutional: Denies fevers, chills or abnormal weight loss Eyes: Denies blurriness of vision Ears, nose, mouth, throat, and face: Denies mucositis or sore throat Respiratory: Denies cough, dyspnea or wheezes Cardiovascular: Denies palpitation, chest discomfort or lower extremity swelling Gastrointestinal:  Denies nausea, heartburn or change in bowel habits Skin: Denies abnormal skin rashes Lymphatics: Denies new lymphadenopathy or easy bruising Neurological:Denies numbness, tingling or new weaknesses Behavioral/Psych: Mood is stable, no new changes  All other systems were reviewed  with the patient and are negative.  I have reviewed the past medical history, past surgical history, social history and family history with the patient and they are unchanged from previous note.  ALLERGIES:  is allergic to codeine; demerol [meperidine]; morphine and related; ciprofloxacin; and sulfa antibiotics.  MEDICATIONS:  Current Outpatient Prescriptions  Medication Sig Dispense Refill  . acetaminophen (TYLENOL) 500 MG tablet Take 500 mg by mouth every 6 (six) hours as needed for pain.    Marland Kitchen acyclovir (ZOVIRAX) 400 MG tablet Take 1 tablet (400 mg total) by mouth daily. 30 tablet 3  . allopurinol (ZYLOPRIM) 300 MG tablet Take 1 tablet (300 mg total) by mouth daily. 30 tablet 3  . aspirin 81 MG tablet Take 81 mg by mouth daily.    Marland Kitchen atorvastatin (LIPITOR) 40 MG tablet Take 40 mg by mouth daily.    . Calcium Carb-Cholecalciferol (CALCIUM + D3) 600-200 MG-UNIT TABS Take 1 tablet by mouth 3 (three) times a week.     Marland Kitchen dexamethasone (DECADRON) 4 MG tablet Take 1 tablet (4 mg total) by mouth daily. Take 1 daily with food in the morning 30 tablet 1  . DIGOX 0.125 MG tablet Take 0.125 mg by mouth daily.    . fluticasone (FLONASE) 50 MCG/ACT nasal spray Place 2 sprays into the nose daily.    . folic acid (FOLVITE) 1 MG tablet Take 1 mg by mouth daily.    . hydrochlorothiazide (HYDRODIURIL) 12.5 MG tablet Take 12.5 mg by mouth daily.    Marland Kitchen ketotifen (ZADITOR) 0.025 % ophthalmic solution Apply 1 drop to eye 2 (two) times daily.    Marland Kitchen lidocaine-prilocaine (EMLA) cream Apply to affected area once 30 g 3  . metoprolol (LOPRESSOR) 50 MG tablet Take 50 mg by mouth 2 (two) times daily.    Marland Kitchen  Multiple Vitamin (MULTIVITAMIN) tablet Take 1 tablet by mouth daily.    . Multiple Vitamins-Minerals (EYE VITAMINS & MINERALS) TABS Take by mouth.    . ondansetron (ZOFRAN) 8 MG tablet Take 1 tablet (8 mg total) by mouth every 8 (eight) hours as needed for refractory nausea / vomiting. 30 tablet 1  . potassium chloride  SA (K-DUR,KLOR-CON) 20 MEQ tablet Take 20 mEq by mouth 3 (three) times a week. Mon, Wed, Fri    . prochlorperazine (COMPAZINE) 10 MG tablet Take 1 tablet (10 mg total) by mouth every 6 (six) hours as needed (Nausea or vomiting). 30 tablet 1   No current facility-administered medications for this visit.     PHYSICAL EXAMINATION: ECOG PERFORMANCE STATUS: 1 - Symptomatic but completely ambulatory  Vitals:   08/04/17 1150  BP: (!) 136/59  Pulse: 61  Resp: 17  Temp: 98.8 F (37.1 C)  SpO2: 97%   Filed Weights   08/04/17 1150  Weight: 143 lb 11.2 oz (65.2 kg)    GENERAL:alert, no distress and comfortable SKIN: skin color, texture, turgor are normal, no rashes or significant lesions EYES: normal, Conjunctiva are pink and non-injected, sclera clear OROPHARYNX:no exudate, no erythema and lips, buccal mucosa, and tongue normal  NECK: supple, thyroid normal size, non-tender, without nodularity LYMPH: She has palpable lymphadenopathy in the head and neck region and axilla  LUNGS: clear to auscultation and percussion with normal breathing effort HEART: regular rate & rhythm and no murmurs and no lower extremity edema.   ABDOMEN:abdomen soft, non-tender and normal bowel sounds. No splenomegaly Musculoskeletal:no cyanosis of digits and no clubbing  NEURO: alert & oriented x 3 with fluent speech, no focal motor/sensory deficits  LABORATORY DATA:  I have reviewed the data as listed    Component Value Date/Time   NA 142 08/04/2017 1136   K 4.0 08/04/2017 1136   CL 104 02/08/2013 1454   CO2 29 08/04/2017 1136   GLUCOSE 95 08/04/2017 1136   GLUCOSE 103 (H) 02/08/2013 1454   BUN 18.4 08/04/2017 1136   CREATININE 0.9 08/04/2017 1136   CALCIUM 10.3 08/04/2017 1136   PROT 7.0 08/04/2017 1136   ALBUMIN 4.3 08/04/2017 1136   AST 27 08/04/2017 1136   ALT 29 08/04/2017 1136   ALKPHOS 78 08/04/2017 1136   BILITOT 0.81 08/04/2017 1136   GFRNONAA >60 03/24/2009 0350   GFRAA  03/24/2009 0350     >60        The eGFR has been calculated using the MDRD equation. This calculation has not been validated in all clinical situations. eGFR's persistently <60 mL/min signify possible Chronic Kidney Disease.    No results found for: SPEP, UPEP  Lab Results  Component Value Date   WBC 50.0 (H) 08/04/2017   NEUTROABS 5.8 08/04/2017   HGB 13.7 08/04/2017   HCT 41.8 08/04/2017   MCV 86.5 08/04/2017   PLT 135 (L) 08/04/2017      Chemistry      Component Value Date/Time   NA 142 08/04/2017 1136   K 4.0 08/04/2017 1136   CL 104 02/08/2013 1454   CO2 29 08/04/2017 1136   BUN 18.4 08/04/2017 1136   CREATININE 0.9 08/04/2017 1136      Component Value Date/Time   CALCIUM 10.3 08/04/2017 1136   ALKPHOS 78 08/04/2017 1136   AST 27 08/04/2017 1136   ALT 29 08/04/2017 1136   BILITOT 0.81 08/04/2017 1136       ASSESSMENT & PLAN:  CLL (chronic lymphocytic leukemia)  The patient has obvious disease progression with worsening leukocytosis, thrombocytopenia and palpable lymphadenopathy She is symptomatic with discomfort in her abdomen I will order a staging CT scan of the chest, abdomen and pelvis We discussed treatment options including ibrutinib versus conventional systemic chemotherapy Due to her history of chronic atrial fibrillation, the patient declines ibrutinib We discussed the role of chemotherapy is of palliative intent The decision was made based on publication in the Blood: Randomized trial of bendamustine-rituximab or R-CHOP/R-CVP in first-line treatment of indolent NHL or MCL: the BRIGHT study.  [place] [address] Lucianne Lei der [address], [place] BS, [address] M, Kwan YL, Simpson D, [name] M, Kolibaba K, Iss[place] S, [place], [place] DM, Munteanu M, Aram Beecham JM SO  Blood. 2014;123(19):2944.  The chemotherapy consists of   1. Bendamustine at 90 mg/m2 on day 1 & 2 2. Rituximab at 375 mg/m2 on day 1 Each cycle + 28 days  Plan for 6 cycles total  with or without Neulasta support  In the international phase III BRIGHT trial, 447 previously untreated patients with advanced stage follicular (n = 161 patients), mantle cell (n = 74 patients), or other indolent lymphoma were randomly assigned to six cycles of BR according to the same dose and schedule described above or to R-CHOP or R-CVP. BR resulted in similar complete (31 versus 25 percent) and overall (97 versus 91 percent) response rates. BR was associated with higher rates of vomiting and drug hypersensitivity and lower rates of peripheral neuropathy/paresthesia and alopecia. The use of prophylactic antiemetics was not specified in the protocol and was more common among patients assigned to R-CHOP.   We discussed some of the risks, benefits and side-effects of Rituximab with Bendamustine.   Some of the short term side-effects included, though not limited to, risk of fatigue, weight loss, tumor lysis syndrome, risk of allergic reactions, pancytopenia, life-threatening infections, need for transfusions of blood products, nausea, vomiting, change in bowel habits, admission to hospital for various reasons, and risks of death.   Long term side-effects are also discussed including permanent damage to nerve function, chronic fatigue, and rare secondary malignancy including bone marrow disorders.   The patient is aware that the response rates discussed earlier is not guaranteed.    After a long discussion, patient made an informed decision to proceed with the prescribed plan of care.   Patient education material was dispensed I will schedule port placement and chemo education class I will start her on acyclovir for antimicrobial prophylaxis I recommend allopurinol for tumor lysis prophylaxis Due to significant burden of disease, I will start her on steroid treatment for 7-10 days prior to initiation of therapy to reduce risk of allergic reaction I plan to see her back within the week or so after  treatment for toxicity review     Goals of care, counseling/discussion The patient is aware she has incurable disease and treatment is strictly palliative. We discussed importance of Advanced Directives and Living will.   Orders Placed This Encounter  Procedures  . IR FLUORO GUIDE PORT INSERTION RIGHT    Standing Status:   Future    Standing Expiration Date:   10/04/2018    Order Specific Question:   Reason for Exam (SYMPTOM  OR DIAGNOSIS REQUIRED)    Answer:   need port for chemo, need before 10/4    Order Specific Question:   Preferred Imaging Location?    Answer:   [facility]  . CT  ABDOMEN PELVIS W CONTRAST    Standing Status:   Future    Standing Expiration Date:   08/04/2018    Order Specific Question:   If indicated for the ordered procedure, I authorize the administration of contrast media per Radiology protocol    Answer:   Yes    Order Specific Question:   Preferred imaging location?    Answer:   [facility]    Order Specific Question:   Radiology Contrast Protocol - do NOT remove file path    Answer:   \\[email or web address]  . CT CHEST W CONTRAST    Standing Status:   Future    Standing Expiration Date:   08/04/2018    Order Specific Question:   If indicated for the ordered procedure, I authorize the administration of contrast media per Radiology protocol    Answer:   Yes    Order Specific Question:   Preferred imaging location?    Answer:   [facility]    Order Specific Question:   Radiology Contrast Protocol - do NOT remove file path    Answer:   \\[email or web address]  . CBC with Differential    Standing Status:   Standing    Number of Occurrences:   20    Standing Expiration Date:   08/05/2018  . Comprehensive metabolic panel    Standing Status:   Standing    Number of Occurrences:   20    Standing Expiration Date:   08/05/2018  . PHYSICIAN COMMUNICATION ORDER    Hepatitis B Virus  screening with HBsAg and anti-HBc recommended prior to treatment with rituximab (Rituxan), ofatumumab (Arzerra) or obinutuzumab Dyann Kief).   All questions were answered. The patient knows to call the clinic with any problems, questions or concerns. No barriers to learning was detected. I spent 30 minutes counseling the patient face to face. The total time spent in the appointment was 40 minutes and more than 50% was on counseling and review of test results     Heath Lark, MD 08/06/2017 10:31 AM

## 2017-08-06 NOTE — Assessment & Plan Note (Signed)
The patient has obvious disease progression with worsening leukocytosis, thrombocytopenia and palpable lymphadenopathy She is symptomatic with discomfort in her abdomen I will order a staging CT scan of the chest, abdomen and pelvis We discussed treatment options including ibrutinib versus conventional systemic chemotherapy Due to her history of chronic atrial fibrillation, the patient declines ibrutinib We discussed the role of chemotherapy is of palliative intent The decision was made based on publication in the Blood: Randomized trial of bendamustine-rituximab or R-CHOP/R-CVP in first-line treatment of indolent NHL or MCL: the BRIGHT study.  [address], Lucianne Lei der [address], [place] BS, [address] M, Kwan YL, Simpson D, [name] M, Kolibaba K, Issa S, Hedrick, [place] DM, Munteanu M, Aram Beecham JM SO  Blood. 2014;123(19):2944.  The chemotherapy consists of   1. Bendamustine at 90 mg/m2 on day 1 & 2 2. Rituximab at 375 mg/m2 on day 1 Each cycle + 28 days  Plan for 6 cycles total with or without Neulasta support  In the international phase III BRIGHT trial, 447 previously untreated patients with advanced stage follicular (n = 563 patients), mantle cell (n = 74 patients), or other indolent lymphoma were randomly assigned to six cycles of BR according to the same dose and schedule described above or to R-CHOP or R-CVP. BR resulted in similar complete (31 versus 25 percent) and overall (97 versus 91 percent) response rates. BR was associated with higher rates of vomiting and drug hypersensitivity and lower rates of peripheral neuropathy/paresthesia and alopecia. The use of prophylactic antiemetics was not specified in the protocol and was more common among patients assigned to R-CHOP.   We discussed some of the risks, benefits and side-effects of Rituximab with Bendamustine.   Some of the short term side-effects included, though not limited to, risk of fatigue, weight  loss, tumor lysis syndrome, risk of allergic reactions, pancytopenia, life-threatening infections, need for transfusions of blood products, nausea, vomiting, change in bowel habits, admission to hospital for various reasons, and risks of death.   Long term side-effects are also discussed including permanent damage to nerve function, chronic fatigue, and rare secondary malignancy including bone marrow disorders.   The patient is aware that the response rates discussed earlier is not guaranteed.    After a long discussion, patient made an informed decision to proceed with the prescribed plan of care.   Patient education material was dispensed I will schedule port placement and chemo education class I will start her on acyclovir for antimicrobial prophylaxis I recommend allopurinol for tumor lysis prophylaxis Due to significant burden of disease, I will start her on steroid treatment for 7-10 days prior to initiation of therapy to reduce risk of allergic reaction I plan to see her back within the week or so after treatment for toxicity review

## 2017-08-06 NOTE — Telephone Encounter (Signed)
IR will draws labs as requested

## 2017-08-07 ENCOUNTER — Encounter: Payer: Self-pay | Admitting: *Deleted

## 2017-08-07 ENCOUNTER — Other Ambulatory Visit: Payer: Medicare Other

## 2017-08-08 ENCOUNTER — Other Ambulatory Visit: Payer: Self-pay | Admitting: Radiology

## 2017-08-11 ENCOUNTER — Encounter (HOSPITAL_COMMUNITY): Payer: Self-pay

## 2017-08-11 ENCOUNTER — Ambulatory Visit (HOSPITAL_COMMUNITY)
Admission: RE | Admit: 2017-08-11 | Discharge: 2017-08-11 | Disposition: A | Discharge status: Home / Self Care | Payer: Medicare Other | Source: Ambulatory Visit | Attending: Hematology and Oncology | Admitting: Hematology and Oncology

## 2017-08-11 ENCOUNTER — Other Ambulatory Visit: Payer: Self-pay | Admitting: Radiology

## 2017-08-11 DIAGNOSIS — E785 Hyperlipidemia, unspecified: Secondary | ICD-10-CM | POA: Diagnosis not present

## 2017-08-11 DIAGNOSIS — Z7951 Long term (current) use of inhaled steroids: Secondary | ICD-10-CM | POA: Insufficient documentation

## 2017-08-11 DIAGNOSIS — D696 Thrombocytopenia, unspecified: Secondary | ICD-10-CM | POA: Insufficient documentation

## 2017-08-11 DIAGNOSIS — R1031 Right lower quadrant pain: Secondary | ICD-10-CM | POA: Diagnosis not present

## 2017-08-11 DIAGNOSIS — I4891 Unspecified atrial fibrillation: Secondary | ICD-10-CM | POA: Insufficient documentation

## 2017-08-11 DIAGNOSIS — G8929 Other chronic pain: Secondary | ICD-10-CM | POA: Insufficient documentation

## 2017-08-11 DIAGNOSIS — M549 Dorsalgia, unspecified: Secondary | ICD-10-CM | POA: Insufficient documentation

## 2017-08-11 DIAGNOSIS — Z7982 Long term (current) use of aspirin: Secondary | ICD-10-CM | POA: Insufficient documentation

## 2017-08-11 DIAGNOSIS — C9110 Chronic lymphocytic leukemia of B-cell type not having achieved remission: Secondary | ICD-10-CM | POA: Insufficient documentation

## 2017-08-11 DIAGNOSIS — R918 Other nonspecific abnormal finding of lung field: Secondary | ICD-10-CM | POA: Diagnosis not present

## 2017-08-11 DIAGNOSIS — I10 Essential (primary) hypertension: Secondary | ICD-10-CM | POA: Diagnosis not present

## 2017-08-11 DIAGNOSIS — Z885 Allergy status to narcotic agent status: Secondary | ICD-10-CM | POA: Insufficient documentation

## 2017-08-11 MED ORDER — IOPAMIDOL (ISOVUE-300) INJECTION 61%
30.0000 mL | Freq: Once | INTRAVENOUS | Status: DC | PRN
  Administered 2017-08-11: 30 mL via ORAL
  Filled 2017-08-11: qty 30

## 2017-08-11 MED ORDER — IOPAMIDOL (ISOVUE-300) INJECTION 61%
INTRAVENOUS | Status: AC
  Filled 2017-08-11: qty 100

## 2017-08-11 MED ORDER — IOPAMIDOL (ISOVUE-300) INJECTION 61%
100.0000 mL | Freq: Once | INTRAVENOUS | Status: AC | PRN
  Administered 2017-08-11: 100 mL via INTRAVENOUS

## 2017-08-11 MED ORDER — IOPAMIDOL (ISOVUE-300) INJECTION 61%
INTRAVENOUS | Status: AC
  Filled 2017-08-11: qty 30

## 2017-08-12 ENCOUNTER — Encounter (HOSPITAL_COMMUNITY): Payer: Self-pay

## 2017-08-12 ENCOUNTER — Other Ambulatory Visit: Payer: Self-pay | Admitting: Hematology and Oncology

## 2017-08-12 ENCOUNTER — Ambulatory Visit (HOSPITAL_COMMUNITY)
Admission: RE | Admit: 2017-08-12 | Discharge: 2017-08-12 | Disposition: A | Discharge status: Home / Self Care | Payer: Medicare Other | Source: Ambulatory Visit | Attending: Hematology and Oncology | Admitting: Hematology and Oncology

## 2017-08-12 DIAGNOSIS — I4891 Unspecified atrial fibrillation: Secondary | ICD-10-CM | POA: Diagnosis not present

## 2017-08-12 DIAGNOSIS — E785 Hyperlipidemia, unspecified: Secondary | ICD-10-CM | POA: Diagnosis not present

## 2017-08-12 DIAGNOSIS — I10 Essential (primary) hypertension: Secondary | ICD-10-CM | POA: Diagnosis not present

## 2017-08-12 DIAGNOSIS — C9110 Chronic lymphocytic leukemia of B-cell type not having achieved remission: Secondary | ICD-10-CM

## 2017-08-12 DIAGNOSIS — Z885 Allergy status to narcotic agent status: Secondary | ICD-10-CM | POA: Diagnosis not present

## 2017-08-12 DIAGNOSIS — D696 Thrombocytopenia, unspecified: Secondary | ICD-10-CM | POA: Diagnosis not present

## 2017-08-12 DIAGNOSIS — Z5111 Encounter for antineoplastic chemotherapy: Secondary | ICD-10-CM | POA: Diagnosis not present

## 2017-08-12 HISTORY — PX: IR US GUIDE VASC ACCESS RIGHT: IMG2390

## 2017-08-12 HISTORY — PX: IR FLUORO GUIDE PORT INSERTION RIGHT: IMG5741

## 2017-08-12 LAB — CBC WITH DIFFERENTIAL/PLATELET
BAND NEUTROPHILS: 0 %
BASOS ABS: 0 10*3/uL (ref 0.0–0.1)
BASOS PCT: 0 %
BLASTS: 0 %
EOS ABS: 0 10*3/uL (ref 0.0–0.7)
Eosinophils Relative: 0 %
HEMATOCRIT: 38.6 % (ref 36.0–46.0)
Hemoglobin: 13 g/dL (ref 12.0–15.0)
LYMPHS ABS: 65.2 10*3/uL — AB (ref 0.7–4.0)
LYMPHS PCT: 89 %
MCH: 28.3 pg (ref 26.0–34.0)
MCHC: 33.7 g/dL (ref 30.0–36.0)
MCV: 83.9 fL (ref 78.0–100.0)
MONOS PCT: 1 %
Metamyelocytes Relative: 0 %
Monocytes Absolute: 0.7 10*3/uL (ref 0.1–1.0)
Myelocytes: 0 %
NEUTROS PCT: 10 %
NRBC: 0 /100{WBCs}
Neutro Abs: 7.3 10*3/uL (ref 1.7–7.7)
PLATELETS: 183 10*3/uL (ref 150–400)
PROMYELOCYTES ABS: 0 %
RBC: 4.6 MIL/uL (ref 3.87–5.11)
RDW: 14.3 % (ref 11.5–15.5)
WBC: 73.2 10*3/uL (ref 4.0–10.5)

## 2017-08-12 LAB — COMPREHENSIVE METABOLIC PANEL
ALT: 32 U/L (ref 14–54)
ANION GAP: 8 (ref 5–15)
AST: 34 U/L (ref 15–41)
Albumin: 4 g/dL (ref 3.5–5.0)
Alkaline Phosphatase: 59 U/L (ref 38–126)
BILIRUBIN TOTAL: 1 mg/dL (ref 0.3–1.2)
BUN: 23 mg/dL — ABNORMAL HIGH (ref 6–20)
CHLORIDE: 104 mmol/L (ref 101–111)
CO2: 25 mmol/L (ref 22–32)
Calcium: 9 mg/dL (ref 8.9–10.3)
Creatinine, Ser: 0.9 mg/dL (ref 0.44–1.00)
GFR calc Af Amer: >60 mL/min (ref >60)
Glucose, Bld: 93 mg/dL (ref 65–99)
POTASSIUM: 4.5 mmol/L (ref 3.5–5.1)
Sodium: 137 mmol/L (ref 135–145)
TOTAL PROTEIN: 6.2 g/dL — AB (ref 6.5–8.1)

## 2017-08-12 LAB — PROTIME-INR
INR: 0.95
PROTHROMBIN TIME: 12.6 s (ref 11.4–15.2)

## 2017-08-12 MED ORDER — LIDOCAINE HCL 1 % IJ SOLN
INTRAMUSCULAR | Status: AC | PRN
  Administered 2017-08-12: 15 mL

## 2017-08-12 MED ORDER — CEFAZOLIN SODIUM-DEXTROSE 2-4 GM/100ML-% IV SOLN
INTRAVENOUS | Status: AC
  Filled 2017-08-12: qty 100

## 2017-08-12 MED ORDER — FENTANYL CITRATE (PF) 100 MCG/2ML IJ SOLN
INTRAMUSCULAR | Status: AC
  Filled 2017-08-12: qty 4

## 2017-08-12 MED ORDER — CEFAZOLIN SODIUM-DEXTROSE 2-4 GM/100ML-% IV SOLN
2.0000 g | INTRAVENOUS | Status: AC
  Administered 2017-08-12: 2 g via INTRAVENOUS

## 2017-08-12 MED ORDER — FENTANYL CITRATE (PF) 100 MCG/2ML IJ SOLN
INTRAMUSCULAR | Status: AC | PRN
  Administered 2017-08-12 (×2): 50 ug via INTRAVENOUS

## 2017-08-12 MED ORDER — MIDAZOLAM HCL 2 MG/2ML IJ SOLN
INTRAMUSCULAR | Status: AC | PRN
  Administered 2017-08-12 (×4): 1 mg via INTRAVENOUS

## 2017-08-12 MED ORDER — LIDOCAINE HCL 1 % IJ SOLN
INTRAMUSCULAR | Status: AC
  Filled 2017-08-12: qty 20

## 2017-08-12 MED ORDER — HEPARIN SOD (PORK) LOCK FLUSH 100 UNIT/ML IV SOLN
INTRAVENOUS | Status: AC
  Filled 2017-08-12: qty 5

## 2017-08-12 MED ORDER — MIDAZOLAM HCL 2 MG/2ML IJ SOLN
INTRAMUSCULAR | Status: AC
  Filled 2017-08-12: qty 4

## 2017-08-12 MED ORDER — SODIUM CHLORIDE 0.9 % IV SOLN
INTRAVENOUS | Status: DC
  Administered 2017-08-12: 10:00:00 via INTRAVENOUS

## 2017-08-12 NOTE — Sedation Documentation (Signed)
Patient denies pain and is resting comfortably.  

## 2017-08-12 NOTE — Consult Note (Signed)
Chief Complaint: Patient was seen in consultation today for Port-A-Cath placement  Referring Physician(s): Amanda [name]  Supervising Physician: Amanda [name]  Patient Status: Amanda [name]  History of Present Illness: Amanda [name] is a 71 y.o. female with history of CLL, initially diagnosed in 2014. She now has evidence of disease progression with worsening leukocytosis and thrombocytopenia. She presents today for Port-A-Cath placement for palliative chemotherapy.  Past Medical History:  Diagnosis Date  . A-fib ([facility])   . Adenopathy   . CLL (chronic lymphocytic leukemia) ([facility] [place]) 08/31/2013  . HTN (hypertension)   . Hyperlipidemia   . Lymphocytosis     Past Surgical History:  Procedure Laterality Date  . ABDOMINAL HYSTERECTOMY     endometriosis, fibroid  . APPENDECTOMY    . BREAST BIOPSY     benign cyst.  . CHOLECYSTECTOMY    . COLONOSCOPY  11/2012   Per Dr. Cristina [name].  neg.   Marland Kitchen THUMB ARTHROSCOPY    . TOTAL HIP ARTHROPLASTY  2010    Allergies: Codeine; Demerol [meperidine]; Morphine and related; Ciprofloxacin; and Sulfa antibiotics  Medications: Prior to Admission medications   Medication Sig Start Date End Date Taking? Authorizing Provider  acetaminophen (TYLENOL) 500 MG tablet Take 500 mg by mouth every 6 (six) hours as needed for pain.   Yes [provider]  allopurinol (ZYLOPRIM) 300 MG tablet Take 1 tablet (300 mg total) by mouth daily. 08/04/17  Yes Amanda Lark, MD  aspirin 81 MG tablet Take 81 mg by mouth daily.   Yes [provider]  atorvastatin (LIPITOR) 40 MG tablet Take 40 mg by mouth daily. 11/07/12  Yes [provider]  Calcium Carb-Cholecalciferol (CALCIUM + D3) 600-200 MG-UNIT TABS Take 1 tablet by mouth 3 (three) times a week.    Yes [provider]  dexamethasone (DECADRON) 4 MG tablet Take 1 tablet (4 mg total) by mouth daily. Take 1 daily with food in the morning 08/04/17  Yes Amanda [name], Ni, MD  DIGOX 0.125 MG  tablet Take 0.125 mg by mouth daily. 12/25/12  Yes [provider]  fluticasone (FLONASE) 50 MCG/ACT nasal spray Place 2 sprays into the nose daily. 01/07/13  Yes [provider]  folic acid (FOLVITE) 1 MG tablet Take 1 mg by mouth daily.   Yes [provider]  hydrochlorothiazide (HYDRODIURIL) 12.5 MG tablet Take 12.5 mg by mouth daily. 11/07/12  Yes [provider]  ketotifen (ZADITOR) 0.025 % ophthalmic solution Apply 1 drop to eye 2 (two) times daily.   Yes [provider]  metoprolol (LOPRESSOR) 50 MG tablet Take 50 mg by mouth 2 (two) times daily. 11/07/12  Yes [provider]  Multiple Vitamin (MULTIVITAMIN) tablet Take 1 tablet by mouth daily.   Yes [provider]  Multiple Vitamins-Minerals (EYE VITAMINS & MINERALS) TABS Take by mouth.   Yes [provider]  potassium chloride SA (K-DUR,KLOR-CON) 20 MEQ tablet Take 20 mEq by mouth 3 (three) times a week. Mon, Wed, Fri 11/07/12  Yes [provider]  acyclovir (ZOVIRAX) 400 MG tablet Take 1 tablet (400 mg total) by mouth daily. 08/04/17   Amanda Lark, MD  lidocaine-prilocaine (EMLA) cream Apply to affected area once 08/04/17   Amanda Lark, MD  ondansetron (ZOFRAN) 8 MG tablet Take 1 tablet (8 mg total) by mouth every 8 (eight) hours as needed for refractory nausea / vomiting. 08/04/17   Amanda Lark, MD  prochlorperazine (COMPAZINE) 10 MG tablet Take 1 tablet (10 mg total) by mouth every  6 (six) hours as needed (Nausea or vomiting). 08/04/17   Amanda Lark, MD     Family History  Problem Relation Age of Onset  . Renal Disease Mother   . Cancer Mother        colon cancer  . Cancer Father        colon cancer    Social History   Social History  . Marital status: Married    Spouse name: N/A  . Number of children: 2  . Years of education: N/A   Occupational History  .      retired [name]; med surgery.    Social History Main Topics  . Smoking status: Never  Smoker  . Smokeless tobacco: Never Used  . Alcohol use No  . Drug use: No  . Sexual activity: Not Asked   Other Topics Concern  . 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 Chest clear to auscultation bilaterally. Heart with slightly bradycardic rate, some occasional ectopy. Abdomen soft, positive bowel sounds, nontender. No lower extremity edema.  Imaging: Ct Chest W Contrast  Result Date: 08/11/2017 CLINICAL DATA:  Chronic lymphocytic leukemia. RIGHT lower quadrant discomfort for 16 weeks. EXAM: CT CHEST, ABDOMEN, AND PELVIS WITH CONTRAST TECHNIQUE: Multidetector CT imaging of the chest, abdomen and pelvis was performed following the standard protocol during bolus administration of intravenous contrast. CONTRAST:  146mL ISOVUE-300 IOPAMIDOL (ISOVUE-300) INJECTION 61%, 82mL ISOVUE-300 IOPAMIDOL (ISOVUE-300) INJECTION 61% COMPARISON:  CT 05/22/2017 FINDINGS: CT CHEST FINDINGS CT CHEST FINDINGS Cardiovascular: No significant vascular findings. Normal heart size. No pericardial effusion. Mediastinum/Nodes: Small axillary lymph nodes clustered and axilla measuring 10 10 mm short axis. No supraclavicular nodes. Small mediastinal lymph nodes are present. For example prevascular precarinal lymph node measuring 13 mm short axis. Precarinal lymph node prevascular lymph node measuring 8 mm short axis. RIGHT hilar lymph node measuring 15 mm. Lungs/Pleura: Lungs are clear Musculoskeletal: No aggressive osseous lesion. CT ABDOMEN AND PELVIS FINDINGS Hepatobiliary: Low-density lesions in liver consistent benign cyst. This dilatation of the common bile duct to 11 mm following cholecystectomy. No pancreatic duct dilatation  Pancreas: Pancreas is normal. No ductal dilatation. No pancreatic inflammation. Spleen: Normal spleen Adrenals/urinary tract: Adrenal glands and kidneys are normal. The ureters and bladder normal. Stomach/Bowel: Stomach, small-bowel cecum normal. The distal ileum is collapsed leading up to the terminal ileum. No clear evidence acute inflammation. There is several enlarged lymph nodes adjacent to the distal ileum. Colon and rectosigmoid colon normal Vascular/Lymphatic:  Abdominal aorta has not calcification. There is mild periaortic lymphadenopathy measuring up to 11 mm short axis LEFT all aorta. Larger lymph nodes are present within the central mesenteries along the branches of the SMA. Example lymph node in the upper pelvis/central mesenteries measuring 18 mm short axis image 86 Cluster of lymph nodes along the root of the mesentery (image 80, series 2) these mesenteric lymph nodes are not changed from comparison CT. Mild iliac adenopathy present. Lymph node in the LEFT operator space measuring 12 mm short axis compares to 15 no significant inguinal adenopathy. Reproductive: Prostate post hysterectomy anatomy Other: No free fluid. Musculoskeletal: No aggressive osseous lesion.  IMPRESSION: Chest Impression: 1. Mild mediastinal and hilar lymphadenopathy. 2. Clusters small axial lymph nodes. Abdomen / Pelvis Impression: 1. Mild periaortic retroperitoneal adenopathy and moderate central mesenteric adenopathy not changed comparison CT. 2. Mild iliac adenopathy 3. Normal volume spleen. 4. No explanation for RIGHT lower quadrant pain. Cluster of lymph nodes in the ileocecal mesenteries similar to prior. Electronically Signed   By: Suzy Bouchard M.D.   On: 08/11/2017 16:39   Ct Abdomen Pelvis W Contrast  Result Date: 08/11/2017 CLINICAL DATA:  Chronic lymphocytic leukemia. RIGHT lower quadrant discomfort for 16 weeks. EXAM: CT CHEST, ABDOMEN, AND PELVIS WITH CONTRAST TECHNIQUE: Multidetector CT imaging of the chest,  abdomen and pelvis was performed following the standard protocol during bolus administration of intravenous contrast. CONTRAST:  142mL ISOVUE-300 IOPAMIDOL (ISOVUE-300) INJECTION 61%, 13mL ISOVUE-300 IOPAMIDOL (ISOVUE-300) INJECTION 61% COMPARISON:  CT 05/22/2017 FINDINGS: CT CHEST FINDINGS CT CHEST FINDINGS Cardiovascular: No significant vascular findings. Normal heart size. No pericardial effusion. Mediastinum/Nodes: Small axillary lymph nodes clustered and axilla measuring 10 10 mm short axis. No supraclavicular nodes. Small mediastinal lymph nodes are present. For example prevascular precarinal lymph node measuring 13 mm short axis. Precarinal lymph node prevascular lymph node measuring 8 mm short axis. RIGHT hilar lymph node measuring 15 mm. Lungs/Pleura: Lungs are clear Musculoskeletal: No aggressive osseous lesion. CT ABDOMEN AND PELVIS FINDINGS Hepatobiliary: Low-density lesions in liver consistent benign cyst. This dilatation of the common bile duct to 11 mm following cholecystectomy. No pancreatic duct dilatation Pancreas: Pancreas is normal. No ductal dilatation. No pancreatic inflammation. Spleen: Normal spleen Adrenals/urinary tract: Adrenal glands and kidneys are normal. The ureters and bladder normal. Stomach/Bowel: Stomach, small-bowel cecum normal. The distal ileum is collapsed leading up to the terminal ileum. No clear evidence acute inflammation. There is several enlarged lymph nodes adjacent to the distal ileum. Colon and rectosigmoid colon normal Vascular/Lymphatic:  Abdominal aorta has not calcification. There is mild periaortic lymphadenopathy measuring up to 11 mm short axis LEFT all aorta. Larger lymph nodes are present within the central mesenteries along the branches of the SMA. Example lymph node in the upper pelvis/central mesenteries measuring 18 mm short axis image 86 Cluster of lymph nodes along the root of the mesentery (image 80, series 2) these mesenteric lymph nodes are not  changed from comparison CT. Mild iliac adenopathy present. Lymph node in the LEFT operator space measuring 12 mm short axis compares to 15 no significant inguinal adenopathy. Reproductive: Prostate post hysterectomy anatomy Other: No free fluid. Musculoskeletal: No aggressive osseous lesion. IMPRESSION: Chest Impression: 1. Mild mediastinal and hilar lymphadenopathy. 2. Clusters small axial lymph nodes. Abdomen / Pelvis Impression: 1. Mild periaortic retroperitoneal adenopathy and moderate central mesenteric adenopathy not changed comparison CT. 2. Mild iliac adenopathy 3. Normal volume spleen. 4. No explanation for RIGHT lower quadrant pain. Cluster of lymph nodes in the ileocecal mesenteries similar to prior. Electronically Signed   By: Suzy Bouchard M.D.   On: 08/11/2017 16:39    Labs:  CBC:  Recent Labs  06/04/17 1036 08/04/17 1136  WBC 44.3* 50.0*  HGB 12.9 13.7  HCT 39.3 41.8  PLT 142* 135*    COAGS: No results for input(s): INR, APTT in the last 8760 hours.  BMP:  Recent Labs  06/04/17 1036 08/04/17 1136  NA 143 142  K 4.2 4.0  CO2 29 29  GLUCOSE 101 95  BUN 18.8 18.4  CALCIUM 10.0 10.3  CREATININE 0.9 0.9    LIVER FUNCTION TESTS:  Recent Labs  06/04/17  1036 08/04/17 1136  BILITOT 0.89 0.81  AST 17 27  ALT 19 29  ALKPHOS 80 78  PROT 6.5 7.0  ALBUMIN 4.1 4.3    TUMOR MARKERS: No results for input(s): AFPTM, CEA, CA199, CHROMGRNA in the last 8760 hours.  Assessment and Plan: 71 y.o. female with history of CLL, initially diagnosed in 2014. She now has evidence of disease progression with worsening leukocytosis and thrombocytopenia. She presents today for Port-A-Cath placement for palliative chemotherapy.Risks and benefits discussed with the patient/sister including, but not limited to bleeding, infection, pneumothorax, or fibrin sheath development and need for additional procedures.All of the patient's questions were answered, patient is agreeable to  proceed.Consent signed and in chart. Labs pending.     Thank you for this interesting consult.  I greatly enjoyed meeting Amanda [name] and look forward to participating in their care.  A copy of this report was sent to the requesting provider on this date.  Electronically Signed: D. Rowe Robert, PA-C 08/12/2017, 10:02 AM   I spent a total of 20 minutes  in face to face in clinical consultation, greater than 50% of which was counseling/coordinating care for Port-A-Cath placement

## 2017-08-12 NOTE — Procedures (Signed)
Interventional Radiology Procedure Note  Procedure: Placement of a right IJ approach single lumen PowerPort.  Tip is positioned at the superior cavoatrial junction and catheter is ready for immediate use.  Complications: No immediate Recommendations:  - Ok to shower tomorrow - Do not submerge for 7 days - Routine line care   Oyindamola Key T. Genieve Ramaswamy, M.D Pager:  [phone]   

## 2017-08-12 NOTE — Progress Notes (Signed)
CRITICAL VALUE ALERT  Critical Value:  WBC 73.2  Date & Time Notied:  08/12/17 11:05  Provider Notified: Rowe Robert, PA  Orders Received/Actions taken: No orders given

## 2017-08-12 NOTE — Discharge Instructions (Signed)
Implanted Port Insertion, Care After °This sheet gives you information about how to care for yourself after your procedure. Your health care provider may also give you more specific instructions. If you have problems or questions, contact your health care provider. °What can I expect after the procedure? °After your procedure, it is common to have: °· Discomfort at the port insertion site. °· Bruising on the skin over the port. This should improve over 3-4 days. ° °Follow these instructions at home: °Port care °· After your port is placed, you will get a manufacturer's information card. The card has information about your port. Keep this card with you at all times. °· Take care of the port as told by your health care provider. Ask your health care provider if you or a family member can get training for taking care of the port at home. A home health care nurse may also take care of the port. °· Make sure to remember what type of port you have. °Incision care °· Follow instructions from your health care provider about how to take care of your port insertion site. Make sure you: °? Wash your hands with soap and water before you change your bandage (dressing). If soap and water are not available, use hand sanitizer. °? Change your dressing as told by your health care provider. °? Leave stitches (sutures), skin glue, or adhesive strips in place. These skin closures may need to stay in place for 2 weeks or longer. If adhesive strip edges start to loosen and curl up, you may trim the loose edges. Do not remove adhesive strips completely unless your health care provider tells you to do that. °· Check your port insertion site every day for signs of infection. Check for: °? More redness, swelling, or pain. °? More fluid or blood. °? Warmth. °? Pus or a bad smell. °General instructions °· Do not take baths, swim, or use a hot tub until your health care provider approves. °· Do not lift anything that is heavier than 10 lb (4.5  kg) for a week, or as told by your health care provider. °· Ask your health care provider when it is okay to: °? Return to work or school. °? Resume usual physical activities or sports. °· Do not drive for 24 hours if you were given a medicine to help you relax (sedative). °· Take over-the-counter and prescription medicines only as told by your health care provider. °· Wear a medical alert bracelet in case of an emergency. This will tell any health care providers that you have a port. °· Keep all follow-up visits as told by your health care provider. This is important. °Contact a health care provider if: °· You cannot flush your port with saline as directed, or you cannot draw blood from the port. °· You have a fever or chills. °· You have more redness, swelling, or pain around your port insertion site. °· You have more fluid or blood coming from your port insertion site. °· Your port insertion site feels warm to the touch. °· You have pus or a bad smell coming from the port insertion site. °Get help right away if: °· You have chest pain or shortness of breath. °· You have bleeding from your port that you cannot control. °Summary °· Take care of the port as told by your health care provider. °· Change your dressing as told by your health care provider. °· Keep all follow-up visits as told by your health care provider. °  This information is not intended to replace advice given to you by your health care provider. Make sure you discuss any questions you have with your health care provider. °Document Released: 08/18/2013 Document Revised: 09/18/2016 Document Reviewed: 09/18/2016 °Elsevier Interactive Patient Education © 2017 [employer] °Moderate Conscious Sedation, Adult, Care After °These instructions provide you with information about caring for yourself after your procedure. Your health care provider may also give you more specific instructions. Your treatment has been planned according to current medical  practices, but problems sometimes occur. Call your health care provider if you have any problems or questions after your procedure. °What can I expect after the procedure? °After your procedure, it is common: °· To feel sleepy for several hours. °· To feel clumsy and have poor balance for several hours. °· To have poor judgment for several hours. °· To vomit if you eat too soon. ° °Follow these instructions at home: °For at least 24 hours after the procedure: ° °· Do not: °? Participate in activities where you could fall or become injured. °? Drive. °? Use heavy machinery. °? Drink alcohol. °? Take sleeping pills or medicines that cause drowsiness. °? Make important decisions or sign legal documents. °? Take care of children on your own. °· Rest. °Eating and drinking °· Follow the diet recommended by your health care provider. °· If you vomit: °? Drink water, juice, or soup when you can drink without vomiting. °? Make sure you have little or no nausea before eating solid foods. °General instructions °· Have a responsible adult stay with you until you are awake and alert. °· Take over-the-counter and prescription medicines only as told by your health care provider. °· If you smoke, do not smoke without supervision. °· Keep all follow-up visits as told by your health care provider. This is important. °Contact a health care provider if: °· You keep feeling nauseous or you keep vomiting. °· You feel light-headed. °· You develop a rash. °· You have a fever. °Get help right away if: °· You have trouble breathing. °This information is not intended to replace advice given to you by your health care provider. Make sure you discuss any questions you have with your health care provider. °Document Released: 08/18/2013 Document Revised: 04/01/2016 Document Reviewed: 02/17/2016 °Elsevier Interactive Patient Education © 2018 [employer] ° °

## 2017-08-14 ENCOUNTER — Ambulatory Visit (HOSPITAL_BASED_OUTPATIENT_CLINIC_OR_DEPARTMENT_OTHER): Payer: Medicare Other

## 2017-08-14 VITALS — BP 117/54 | HR 56 | Temp 98.5°F | Resp 16

## 2017-08-14 DIAGNOSIS — Z5111 Encounter for antineoplastic chemotherapy: Secondary | ICD-10-CM

## 2017-08-14 DIAGNOSIS — Z5112 Encounter for antineoplastic immunotherapy: Secondary | ICD-10-CM | POA: Diagnosis not present

## 2017-08-14 DIAGNOSIS — C9110 Chronic lymphocytic leukemia of B-cell type not having achieved remission: Secondary | ICD-10-CM | POA: Diagnosis not present

## 2017-08-14 MED ORDER — DEXAMETHASONE SODIUM PHOSPHATE 10 MG/ML IJ SOLN
INTRAMUSCULAR | Status: AC
Start: 2017-08-14 — End: 2017-08-14
  Filled 2017-08-14: qty 1

## 2017-08-14 MED ORDER — DIPHENHYDRAMINE HCL 25 MG PO CAPS
50.0000 mg | ORAL_CAPSULE | Freq: Once | ORAL | Status: AC
  Administered 2017-08-14: 50 mg via ORAL

## 2017-08-14 MED ORDER — ACETAMINOPHEN 325 MG PO TABS
ORAL_TABLET | ORAL | Status: AC
  Filled 2017-08-14: qty 2

## 2017-08-14 MED ORDER — ACETAMINOPHEN 325 MG PO TABS
650.0000 mg | ORAL_TABLET | Freq: Once | ORAL | Status: AC
  Administered 2017-08-14: 650 mg via ORAL

## 2017-08-14 MED ORDER — SODIUM CHLORIDE 0.9% FLUSH
10.0000 mL | INTRAVENOUS | Status: DC | PRN
  Administered 2017-08-14: 10 mL
  Filled 2017-08-14: qty 10

## 2017-08-14 MED ORDER — RITUXIMAB CHEMO INJECTION 500 MG/50ML
375.0000 mg/m2 | Freq: Once | INTRAVENOUS | Status: AC
  Administered 2017-08-14: 600 mg via INTRAVENOUS
  Filled 2017-08-14: qty 50

## 2017-08-14 MED ORDER — SODIUM CHLORIDE 0.9 % IV SOLN
90.0000 mg/m2 | Freq: Once | INTRAVENOUS | Status: AC
  Administered 2017-08-14: 150 mg via INTRAVENOUS
  Filled 2017-08-14: qty 6

## 2017-08-14 MED ORDER — SODIUM CHLORIDE 0.9 % IV SOLN
Freq: Once | INTRAVENOUS | Status: AC
  Administered 2017-08-14: 10:00:00 via INTRAVENOUS

## 2017-08-14 MED ORDER — PALONOSETRON HCL INJECTION 0.25 MG/5ML
INTRAVENOUS | Status: AC
Start: 2017-08-14 — End: 2017-08-14
  Filled 2017-08-14: qty 5

## 2017-08-14 MED ORDER — HEPARIN SOD (PORK) LOCK FLUSH 100 UNIT/ML IV SOLN
500.0000 [IU] | Freq: Once | INTRAVENOUS | Status: AC | PRN
  Administered 2017-08-14: 500 [IU]
  Filled 2017-08-14: qty 5

## 2017-08-14 MED ORDER — PALONOSETRON HCL INJECTION 0.25 MG/5ML
0.2500 mg | Freq: Once | INTRAVENOUS | Status: AC
  Administered 2017-08-14: 0.25 mg via INTRAVENOUS

## 2017-08-14 MED ORDER — DIPHENHYDRAMINE HCL 25 MG PO CAPS
ORAL_CAPSULE | ORAL | Status: AC
  Filled 2017-08-14: qty 2

## 2017-08-14 MED ORDER — DEXAMETHASONE SODIUM PHOSPHATE 10 MG/ML IJ SOLN
10.0000 mg | Freq: Once | INTRAMUSCULAR | Status: AC
  Administered 2017-08-14: 10 mg via INTRAVENOUS

## 2017-08-14 NOTE — Patient Instructions (Signed)
[facility] Discharge Instructions for Patients Receiving Chemotherapy  Today you received the following chemotherapy agents:  Rituxan and Bendeka.  To help prevent nausea and vomiting after your treatment, we encourage you to take your nausea medication as directed.   If you develop nausea and vomiting that is not controlled by your nausea medication, call the clinic.   BELOW ARE SYMPTOMS THAT SHOULD BE REPORTED IMMEDIATELY:  *FEVER GREATER THAN 100.5 F  *CHILLS WITH OR WITHOUT FEVER  NAUSEA AND VOMITING THAT IS NOT CONTROLLED WITH YOUR NAUSEA MEDICATION  *UNUSUAL SHORTNESS OF BREATH  *UNUSUAL BRUISING OR BLEEDING  TENDERNESS IN MOUTH AND THROAT WITH OR WITHOUT PRESENCE OF ULCERS  *URINARY PROBLEMS  *BOWEL PROBLEMS  UNUSUAL RASH Items with * indicate a potential emergency and should be followed up as soon as possible.  Feel free to call the clinic should you have any questions or concerns. The clinic phone number is [phone].  Please show the CHEMO ALERT CARD at check-in to the Emergency Department and triage nurse.  Rituximab injection What is this medicine? RITUXIMAB (ri TUX i mab) is a monoclonal antibody. It is used to treat certain types of cancer like non-Hodgkin lymphoma and chronic lymphocytic leukemia. It is also used to treat rheumatoid arthritis, granulomatosis with polyangiitis (or Wegener's granulomatosis), and microscopic polyangiitis. This medicine may be used for other purposes; ask your health care provider or pharmacist if you have questions. COMMON BRAND NAME(S): Rituxan What should I tell my health care provider before I take this medicine? They need to know if you have any of these conditions: -heart disease -infection (especially a virus infection such as hepatitis B, chickenpox, cold sores, or herpes) -immune system problems -irregular heartbeat -kidney disease -lung or breathing disease, like asthma -recently received or  scheduled to receive a vaccine -an unusual or allergic reaction to rituximab, mouse proteins, other medicines, foods, dyes, or preservatives -pregnant or trying to get pregnant -breast-feeding How should I use this medicine? This medicine is for infusion into a vein. It is administered in a hospital or clinic by a specially trained health care professional. A special MedGuide will be given to you by the pharmacist with each prescription and refill. Be sure to read this information carefully each time. Talk to your pediatrician regarding the use of this medicine in children. This medicine is not approved for use in children. Overdosage: If you think you have taken too much of this medicine contact a poison control center or emergency room at once. NOTE: This medicine is only for you. Do not share this medicine with others. What if I miss a dose? It is important not to miss a dose. Call your doctor or health care professional if you are unable to keep an appointment. What may interact with this medicine? -cisplatin -other medicines for arthritis like disease modifying antirheumatic drugs or tumor necrosis factor inhibitors -live virus vaccines This list may not describe all possible interactions. Give your health care provider a list of all the medicines, herbs, non-prescription drugs, or dietary supplements you use. Also tell them if you smoke, drink alcohol, or use illegal drugs. Some items may interact with your medicine. What should I watch for while using this medicine? Your condition will be monitored carefully while you are receiving this medicine. You may need blood work done while you are taking this medicine. This medicine can cause serious allergic reactions. To reduce your risk you may need to take medicine before treatment with this medicine.   Take your medicine as directed. In some patients, this medicine may cause a serious brain infection that may cause death. If you have any  problems seeing, thinking, speaking, walking, or standing, tell your doctor right away. If you cannot reach your doctor, urgently seek other source of medical care. Call your doctor or health care professional for advice if you get a fever, chills or sore throat, or other symptoms of a cold or flu. Do not treat yourself. This drug decreases your body's ability to fight infections. Try to avoid being around people who are sick. Do not become pregnant while taking this medicine or for 12 months after stopping it. Women should inform their doctor if they wish to become pregnant or think they might be pregnant. There is a potential for serious side effects to an unborn child. Talk to your health care professional or pharmacist for more information. What side effects may I notice from receiving this medicine? Side effects that you should report to your doctor or health care professional as soon as possible: -breathing problems -chest pain -dizziness or feeling faint -fast, irregular heartbeat -low blood counts - this medicine may decrease the number of white blood cells, red blood cells and platelets. You may be at increased risk for infections and bleeding. -mouth sores -redness, blistering, peeling or loosening of the skin, including inside the mouth (this can be added for any serious or exfoliative rash that could lead to hospitalization) -signs of infection - fever or chills, cough, sore throat, pain or difficulty passing urine -signs and symptoms of kidney injury like trouble passing urine or change in the amount of urine -signs and symptoms of liver injury like dark yellow or brown urine; general ill feeling or flu-like symptoms; light-colored stools; loss of appetite; nausea; right upper belly pain; unusually weak or tired; yellowing of the eyes or skin -stomach pain -vomiting Side effects that usually do not require medical attention (report to your doctor or health care professional if they  continue or are bothersome): -headache -joint pain -muscle cramps or muscle pain This list may not describe all possible side effects. Call your doctor for medical advice about side effects. You may report side effects to FDA at 1-800-FDA-1088. Where should I keep my medicine? This drug is given in a hospital or clinic and will not be stored at home. NOTE: This sheet is a summary. It may not cover all possible information. If you have questions about this medicine, talk to your doctor, pharmacist, or health care provider.  2018 Elsevier/Gold Standard (2016-06-05 15:28:09)  Bendamustine Injection What is this medicine? BENDAMUSTINE (BEN da MUS teen) is a chemotherapy drug. It is used to treat chronic lymphocytic leukemia and non-Hodgkin lymphoma. This medicine may be used for other purposes; ask your health care provider or pharmacist if you have questions. COMMON BRAND NAME(S): BENDEKA, Treanda What should I tell my health care provider before I take this medicine? They need to know if you have any of these conditions: -infection (especially a virus infection such as chickenpox, cold sores, or herpes) -kidney disease -liver disease -an unusual or allergic reaction to bendamustine, mannitol, other medicines, foods, dyes, or preservatives -pregnant or trying to get pregnant -breast-feeding How should I use this medicine? This medicine is for infusion into a vein. It is given by a health care professional in a hospital or clinic setting. Talk to your pediatrician regarding the use of this medicine in children. Special care may be needed. Overdosage: If you think you   think you have taken too much of this medicine contact a poison control center or emergency room at once. NOTE: This medicine is only for you. Do not share this medicine with others. What if I miss a dose? It is important not to miss your dose. Call your doctor or health care professional if you are unable to keep an  appointment. What may interact with this medicine? Do not take this medicine with any of the following medications: -clozapine This medicine may also interact with the following medications: -atazanavir -cimetidine -ciprofloxacin -enoxacin -fluvoxamine -medicines for seizures like carbamazepine and phenobarbital -mexiletine -rifampin -tacrine -thiabendazole -zileuton This list may not describe all possible interactions. Give your health care provider a list of all the medicines, herbs, non-prescription drugs, or dietary supplements you use. Also tell them if you smoke, drink alcohol, or use illegal drugs. Some items may interact with your medicine. What should I watch for while using this medicine? This drug may make you feel generally unwell. This is not uncommon, as chemotherapy can affect healthy cells as well as cancer cells. Report any side effects. Continue your course of treatment even though you feel ill unless your doctor tells you to stop. You may need blood work done while you are taking this medicine. Call your doctor or health care professional for advice if you get a fever, chills or sore throat, or other symptoms of a cold or flu. Do not treat yourself. This drug decreases your body's ability to fight infections. Try to avoid being around people who are sick. This medicine may increase your risk to bruise or bleed. Call your doctor or health care professional if you notice any unusual bleeding. Talk to your doctor about your risk of cancer. You may be more at risk for certain types of cancers if you take this medicine. Do not become pregnant while taking this medicine or for 3 months after stopping it. Women should inform their doctor if they wish to become pregnant or think they might be pregnant. Men should not father a child while taking this medicine and for 3 months after stopping it.There is a potential for serious side effects to an unborn child. Talk to your health care  professional or pharmacist for more information. Do not breast-feed an infant while taking this medicine. This medicine may interfere with the ability to have a child. You should talk with your doctor or health care professional if you are concerned about your fertility. What side effects may I notice from receiving this medicine? Side effects that you should report to your doctor or health care professional as soon as possible: -allergic reactions like skin rash, itching or hives, swelling of the face, lips, or tongue -low blood counts - this medicine may decrease the number of white blood cells, red blood cells and platelets. You may be at increased risk for infections and bleeding. -redness, blistering, peeling or loosening of the skin, including inside the mouth -signs of infection - fever or chills, cough, sore throat, pain or difficulty passing urine -signs of decreased platelets or bleeding - bruising, pinpoint red spots on the skin, black, tarry stools, blood in the urine -signs of decreased red blood cells - unusually weak or tired, fainting spells, lightheadedness -signs and symptoms of kidney injury like trouble passing urine or change in the amount of urine -signs and symptoms of liver injury like dark yellow or brown urine; general ill feeling or flu-like symptoms; light-colored stools; loss of appetite; nausea; right upper belly   weak or tired; yellowing of the eyes or skin Side effects that usually do not require medical attention (report to your doctor or health care professional if they continue or are bothersome): -constipation -decreased appetite -diarrhea -headache -mouth sores -nausea/vomiting -tiredness This list may not describe all possible side effects. Call your doctor for medical advice about side effects. You may report side effects to FDA at 1-800-FDA-1088. Where should I keep my medicine? This drug is given in a hospital or clinic and will not be stored at  home. NOTE: This sheet is a summary. It may not cover all possible information. If you have questions about this medicine, talk to your doctor, pharmacist, or health care provider.  2018 Elsevier/Gold Standard (2015-08-31 08:45:41)   

## 2017-08-14 NOTE — Progress Notes (Signed)
Discharge instructions reviewed with the pt. Pt verbalized understanding.

## 2017-08-15 ENCOUNTER — Telehealth: Payer: Self-pay

## 2017-08-15 ENCOUNTER — Ambulatory Visit (HOSPITAL_BASED_OUTPATIENT_CLINIC_OR_DEPARTMENT_OTHER): Payer: Medicare Other

## 2017-08-15 VITALS — BP 134/76 | HR 54 | Temp 97.9°F | Resp 18

## 2017-08-15 DIAGNOSIS — C9110 Chronic lymphocytic leukemia of B-cell type not having achieved remission: Secondary | ICD-10-CM | POA: Diagnosis not present

## 2017-08-15 DIAGNOSIS — Z5111 Encounter for antineoplastic chemotherapy: Secondary | ICD-10-CM | POA: Diagnosis not present

## 2017-08-15 MED ORDER — SODIUM CHLORIDE 0.9% FLUSH
10.0000 mL | INTRAVENOUS | Status: DC | PRN
  Administered 2017-08-15: 10 mL
  Filled 2017-08-15: qty 10

## 2017-08-15 MED ORDER — SODIUM CHLORIDE 0.9 % IV SOLN
90.0000 mg/m2 | Freq: Once | INTRAVENOUS | Status: AC
  Administered 2017-08-15: 150 mg via INTRAVENOUS
  Filled 2017-08-15: qty 6

## 2017-08-15 MED ORDER — HEPARIN SOD (PORK) LOCK FLUSH 100 UNIT/ML IV SOLN
500.0000 [IU] | Freq: Once | INTRAVENOUS | Status: AC | PRN
  Administered 2017-08-15: 500 [IU]
  Filled 2017-08-15: qty 5

## 2017-08-15 MED ORDER — DEXAMETHASONE SODIUM PHOSPHATE 10 MG/ML IJ SOLN
INTRAMUSCULAR | Status: AC
  Filled 2017-08-15: qty 1

## 2017-08-15 MED ORDER — DEXAMETHASONE SODIUM PHOSPHATE 10 MG/ML IJ SOLN
10.0000 mg | Freq: Once | INTRAMUSCULAR | Status: AC
  Administered 2017-08-15: 10 mg via INTRAVENOUS

## 2017-08-15 MED ORDER — SODIUM CHLORIDE 0.9 % IV SOLN
Freq: Once | INTRAVENOUS | Status: AC
  Administered 2017-08-15: 09:00:00 via INTRAVENOUS

## 2017-08-15 NOTE — Patient Instructions (Signed)
[facility] Discharge Instructions for Patients Receiving Chemotherapy  Today you received the following chemotherapy agents Bendeka.  To help prevent nausea and vomiting after your treatment, we encourage you to take your nausea medication as directed.   If you develop nausea and vomiting that is not controlled by your nausea medication, call the clinic.   BELOW ARE SYMPTOMS THAT SHOULD BE REPORTED IMMEDIATELY:  *FEVER GREATER THAN 100.5 F  *CHILLS WITH OR WITHOUT FEVER  NAUSEA AND VOMITING THAT IS NOT CONTROLLED WITH YOUR NAUSEA MEDICATION  *UNUSUAL SHORTNESS OF BREATH  *UNUSUAL BRUISING OR BLEEDING  TENDERNESS IN MOUTH AND THROAT WITH OR WITHOUT PRESENCE OF ULCERS  *URINARY PROBLEMS  *BOWEL PROBLEMS  UNUSUAL RASH Items with * indicate a potential emergency and should be followed up as soon as possible.  Feel free to call the clinic should you have any questions or concerns. The clinic phone number is [phone].  Please show the CHEMO ALERT CARD at check-in to the Emergency Department and triage nurse.   

## 2017-08-15 NOTE — Telephone Encounter (Signed)
Called and left message to call office for problems. This is just a call to see how she is doing.

## 2017-08-15 NOTE — Telephone Encounter (Signed)
-----   Message from Beatriz Chancellor, RN sent at 08/14/2017  4:29 PM EDT ----- Regarding: Dr.Gorsuch chemo follow up call Pt of Dr. Alvy Bimler first time Rituxan and bendeka. Pt tolerated well.

## 2017-08-18 ENCOUNTER — Other Ambulatory Visit: Payer: Self-pay | Admitting: Hematology and Oncology

## 2017-08-18 DIAGNOSIS — C9110 Chronic lymphocytic leukemia of B-cell type not having achieved remission: Secondary | ICD-10-CM

## 2017-08-21 ENCOUNTER — Encounter: Payer: Self-pay | Admitting: Hematology and Oncology

## 2017-08-21 ENCOUNTER — Other Ambulatory Visit (HOSPITAL_BASED_OUTPATIENT_CLINIC_OR_DEPARTMENT_OTHER): Payer: Medicare Other

## 2017-08-21 ENCOUNTER — Ambulatory Visit (HOSPITAL_BASED_OUTPATIENT_CLINIC_OR_DEPARTMENT_OTHER): Payer: Medicare Other

## 2017-08-21 ENCOUNTER — Telehealth: Payer: Self-pay | Admitting: Hematology and Oncology

## 2017-08-21 ENCOUNTER — Ambulatory Visit (HOSPITAL_BASED_OUTPATIENT_CLINIC_OR_DEPARTMENT_OTHER): Payer: Medicare Other | Admitting: Hematology and Oncology

## 2017-08-21 DIAGNOSIS — C9110 Chronic lymphocytic leukemia of B-cell type not having achieved remission: Secondary | ICD-10-CM

## 2017-08-21 DIAGNOSIS — K5900 Constipation, unspecified: Secondary | ICD-10-CM | POA: Diagnosis not present

## 2017-08-21 DIAGNOSIS — K5909 Other constipation: Secondary | ICD-10-CM

## 2017-08-21 DIAGNOSIS — D696 Thrombocytopenia, unspecified: Secondary | ICD-10-CM

## 2017-08-21 DIAGNOSIS — Z95828 Presence of other vascular implants and grafts: Secondary | ICD-10-CM | POA: Insufficient documentation

## 2017-08-21 LAB — CBC WITH DIFFERENTIAL/PLATELET
BASO%: 1 % (ref 0.0–2.0)
Basophils Absolute: 0 10*3/uL (ref 0.0–0.1)
EOS ABS: 0.1 10*3/uL (ref 0.0–0.5)
EOS%: 1.6 % (ref 0.0–7.0)
HCT: 36.9 % (ref 34.8–46.6)
HEMOGLOBIN: 12.4 g/dL (ref 11.6–15.9)
LYMPH#: 0.2 10*3/uL — AB (ref 0.9–3.3)
LYMPH%: 4.8 % — ABNORMAL LOW (ref 14.0–49.7)
MCH: 27.9 pg (ref 25.1–34.0)
MCHC: 33.6 g/dL (ref 31.5–36.0)
MCV: 83 fL (ref 79.5–101.0)
MONO#: 0.4 10*3/uL (ref 0.1–0.9)
MONO%: 8.7 % (ref 0.0–14.0)
NEUT%: 83.9 % — ABNORMAL HIGH (ref 38.4–76.8)
NEUTROS ABS: 4.1 10*3/uL (ref 1.5–6.5)
Platelets: 132 10*3/uL — ABNORMAL LOW (ref 145–400)
RBC: 4.45 10*6/uL (ref 3.70–5.45)
RDW: 14.6 % — AB (ref 11.2–14.5)
WBC: 4.8 10*3/uL (ref 3.9–10.3)

## 2017-08-21 LAB — COMPREHENSIVE METABOLIC PANEL
ALT: 20 U/L (ref 0–55)
ANION GAP: 9 meq/L (ref 3–11)
AST: 16 U/L (ref 5–34)
Albumin: 3.6 g/dL (ref 3.5–5.0)
Alkaline Phosphatase: 65 U/L (ref 40–150)
BUN: 21.7 mg/dL (ref 7.0–26.0)
CALCIUM: 9.7 mg/dL (ref 8.4–10.4)
CHLORIDE: 102 meq/L (ref 98–109)
CO2: 28 mEq/L (ref 22–29)
CREATININE: 0.9 mg/dL (ref 0.6–1.1)
Glucose: 115 mg/dl (ref 70–140)
POTASSIUM: 3.6 meq/L (ref 3.5–5.1)
Sodium: 140 mEq/L (ref 136–145)
Total Bilirubin: 1.17 mg/dL (ref 0.20–1.20)
Total Protein: 6.3 g/dL — ABNORMAL LOW (ref 6.4–8.3)

## 2017-08-21 LAB — URIC ACID: Uric Acid, Serum: 3.8 mg/dl (ref 2.6–7.4)

## 2017-08-21 MED ORDER — SODIUM CHLORIDE 0.9% FLUSH
10.0000 mL | INTRAVENOUS | Status: DC | PRN
  Administered 2017-08-21: 10 mL via INTRAVENOUS
  Filled 2017-08-21: qty 10

## 2017-08-21 MED ORDER — HEPARIN SOD (PORK) LOCK FLUSH 100 UNIT/ML IV SOLN
500.0000 [IU] | Freq: Once | INTRAVENOUS | Status: AC | PRN
  Administered 2017-08-21: 500 [IU] via INTRAVENOUS
  Filled 2017-08-21: qty 5

## 2017-08-21 NOTE — Telephone Encounter (Signed)
Scheduled appt per 10/11 los - Gave patient AVS and calender per los.  

## 2017-08-22 DIAGNOSIS — K5909 Other constipation: Secondary | ICD-10-CM | POA: Insufficient documentation

## 2017-08-22 NOTE — Assessment & Plan Note (Signed)
She tolerated treatment very well with no side effects Blood work showed complete remission with resolution of lymphocytosis The patient is pleasantly surprised She will continue acyclovir for antimicrobial prophylaxis along with allopurinol for tumor lysis prophylaxis I recommend discontinuation of dexamethasone I will see her back prior to cycle 2 of treatment for further assessment

## 2017-08-22 NOTE — Progress Notes (Signed)
[facility] OFFICE PROGRESS NOTE  Patient Care Team: Cari Caraway, MD as PCP - General (Family Medicine) Irine Seal, MD as Attending Physician (Urology) Jacolyn Reedy, MD as Attending Physician (Cardiology) Heath Lark, MD as Consulting Physician (Hematology and Oncology)  SUMMARY OF ONCOLOGIC HISTORY: Oncology History   Del 13q     CLL (chronic lymphocytic leukemia) ([place])   02/09/2013 Pathology Results    Peripheral Blood Flow Cytometry - FINDINGS CONSISTENT WITH CHRONIC LYMPHOCYTIC LEUKEMIA      09/02/2013 Pathology Results    FISH positive for deletion 13 q      05/22/2017 Imaging    Worsening mesenteric and retroperitoneal adenopathy concerning for worsening lymphoproliferative disorder/lymphoma.  Small cysts in the liver and kidneys are stable.  Prior cholecystectomy.  Aortic atherosclerosis.  Left colonic diverticulosis without active diverticulitis.      08/11/2017 Imaging    Chest Impression:  1. Mild mediastinal and hilar lymphadenopathy. 2. Clusters small axial lymph nodes.  Abdomen / Pelvis Impression:  1. Mild periaortic retroperitoneal adenopathy and moderate central mesenteric adenopathy not changed comparison CT. 2. Mild iliac adenopathy 3. Normal volume spleen. 4. No explanation for RIGHT lower quadrant pain. Cluster of lymph nodes in the ileocecal mesenteries similar to prior.      08/12/2017 Procedure    Placement of single lumen port a cath via right internal jugular vein. The catheter tip lies at the cavo-atrial junction. A power injectable port a cath was placed and is ready for immediate use.      08/14/2017 -  Chemotherapy    She received Bendamustine and Rituxan       INTERVAL HISTORY: Please see below for problem oriented charting. She is seen after chemotherapy for assessment of side effects She is doing very well She denies nausea She had mild constipation, resolved with laxatives No new  lymphadenopathy No recent infection The patient denies any recent signs or symptoms of bleeding such as spontaneous epistaxis, hematuria or hematochezia.  REVIEW OF SYSTEMS:   Constitutional: Denies fevers, chills or abnormal weight loss Eyes: Denies blurriness of vision Ears, nose, mouth, throat, and face: Denies mucositis or sore throat Respiratory: Denies cough, dyspnea or wheezes Cardiovascular: Denies palpitation, chest discomfort or lower extremity swelling Skin: Denies abnormal skin rashes Lymphatics: Denies new lymphadenopathy or easy bruising Neurological:Denies numbness, tingling or new weaknesses Behavioral/Psych: Mood is stable, no new changes  All other systems were reviewed with the patient and are negative.  I have reviewed the past medical history, past surgical history, social history and family history with the patient and they are unchanged from previous note.  ALLERGIES:  is allergic to codeine; demerol [meperidine]; morphine and related; ciprofloxacin; and sulfa antibiotics.  MEDICATIONS:  Current Outpatient Prescriptions  Medication Sig Dispense Refill  . acetaminophen (TYLENOL) 500 MG tablet Take 500 mg by mouth every 6 (six) hours as needed for pain.    Marland Kitchen acyclovir (ZOVIRAX) 400 MG tablet Take 1 tablet (400 mg total) by mouth daily. 30 tablet 3  . allopurinol (ZYLOPRIM) 300 MG tablet Take 1 tablet (300 mg total) by mouth daily. 30 tablet 3  . aspirin 81 MG tablet Take 81 mg by mouth daily.    Marland Kitchen atorvastatin (LIPITOR) 40 MG tablet Take 40 mg by mouth daily.    . Calcium Carb-Cholecalciferol (CALCIUM + D3) 600-200 MG-UNIT TABS Take 1 tablet by mouth 3 (three) times a week.     Marland Kitchen dexamethasone (DECADRON) 4 MG tablet Take 1 tablet (4 mg total)  by mouth daily. Take 1 daily with food in the morning 30 tablet 1  . DIGOX 0.125 MG tablet Take 0.125 mg by mouth daily.    . fluticasone (FLONASE) 50 MCG/ACT nasal spray Place 2 sprays into the nose daily.    . folic acid  (FOLVITE) 1 MG tablet Take 1 mg by mouth daily.    . hydrochlorothiazide (HYDRODIURIL) 12.5 MG tablet Take 12.5 mg by mouth daily.    Marland Kitchen ketotifen (ZADITOR) 0.025 % ophthalmic solution Apply 1 drop to eye 2 (two) times daily.    Marland Kitchen lidocaine-prilocaine (EMLA) cream Apply to affected area once 30 g 3  . metoprolol (LOPRESSOR) 50 MG tablet Take 50 mg by mouth 2 (two) times daily.    . Multiple Vitamin (MULTIVITAMIN) tablet Take 1 tablet by mouth daily.    . Multiple Vitamins-Minerals (EYE VITAMINS & MINERALS) TABS Take by mouth.    . ondansetron (ZOFRAN) 8 MG tablet Take 1 tablet (8 mg total) by mouth every 8 (eight) hours as needed for refractory nausea / vomiting. 30 tablet 1  . potassium chloride SA (K-DUR,KLOR-CON) 20 MEQ tablet Take 20 mEq by mouth 3 (three) times a week. Mon, Wed, Fri    . prochlorperazine (COMPAZINE) 10 MG tablet Take 1 tablet (10 mg total) by mouth every 6 (six) hours as needed (Nausea or vomiting). 30 tablet 1   No current facility-administered medications for this visit.     PHYSICAL EXAMINATION: ECOG PERFORMANCE STATUS: 0 - Asymptomatic  Vitals:   08/21/17 1353  BP: (!) 125/55  Pulse: 66  Resp: 20  Temp: 98.7 F (37.1 C)  SpO2: 98%   Filed Weights   08/21/17 1353  Weight: 141 lb 9.6 oz (64.2 kg)    GENERAL:alert, no distress and comfortable SKIN: skin color, texture, turgor are normal, no rashes or significant lesions EYES: normal, Conjunctiva are pink and non-injected, sclera clear OROPHARYNX:no exudate, no erythema and lips, buccal mucosa, and tongue normal  NECK: supple, thyroid normal size, non-tender, without nodularity LYMPH:  no palpable lymphadenopathy in the cervical, axillary or inguinal LUNGS: clear to auscultation and percussion with normal breathing effort HEART: regular rate & rhythm and no murmurs and no lower extremity edema ABDOMEN:abdomen soft, non-tender and normal bowel sounds Musculoskeletal:no cyanosis of digits and no clubbing   NEURO: alert & oriented x 3 with fluent speech, no focal motor/sensory deficits  LABORATORY DATA:  I have reviewed the data as listed    Component Value Date/Time   NA 140 08/21/2017 1306   K 3.6 08/21/2017 1306   CL 104 08/12/2017 0955   CL 104 02/08/2013 1454   CO2 28 08/21/2017 1306   GLUCOSE 115 08/21/2017 1306   GLUCOSE 103 (H) 02/08/2013 1454   BUN 21.7 08/21/2017 1306   CREATININE 0.9 08/21/2017 1306   CALCIUM 9.7 08/21/2017 1306   PROT 6.3 (L) 08/21/2017 1306   ALBUMIN 3.6 08/21/2017 1306   AST 16 08/21/2017 1306   ALT 20 08/21/2017 1306   ALKPHOS 65 08/21/2017 1306   BILITOT 1.17 08/21/2017 1306   GFRNONAA >60 08/12/2017 0955   GFRAA >60 08/12/2017 0955    No results found for: SPEP, UPEP  Lab Results  Component Value Date   WBC 4.8 08/21/2017   NEUTROABS 4.1 08/21/2017   HGB 12.4 08/21/2017   HCT 36.9 08/21/2017   MCV 83.0 08/21/2017   PLT 132 (L) 08/21/2017      Chemistry      Component Value Date/Time   NA  140 08/21/2017 1306   K 3.6 08/21/2017 1306   CL 104 08/12/2017 0955   CL 104 02/08/2013 1454   CO2 28 08/21/2017 1306   BUN 21.7 08/21/2017 1306   CREATININE 0.9 08/21/2017 1306      Component Value Date/Time   CALCIUM 9.7 08/21/2017 1306   ALKPHOS 65 08/21/2017 1306   AST 16 08/21/2017 1306   ALT 20 08/21/2017 1306   BILITOT 1.17 08/21/2017 1306       RADIOGRAPHIC STUDIES: I have reviewed her CT scan with the patient I have personally reviewed the radiological images as listed and agreed with the findings in the report. Ct Chest W Contrast  Result Date: 08/11/2017 CLINICAL DATA:  Chronic lymphocytic leukemia. RIGHT lower quadrant discomfort for 16 weeks. EXAM: CT CHEST, ABDOMEN, AND PELVIS WITH CONTRAST TECHNIQUE: Multidetector CT imaging of the chest, abdomen and pelvis was performed following the standard protocol during bolus administration of intravenous contrast. CONTRAST:  136mL ISOVUE-300 IOPAMIDOL (ISOVUE-300) INJECTION 61%,  19mL ISOVUE-300 IOPAMIDOL (ISOVUE-300) INJECTION 61% COMPARISON:  CT 05/22/2017 FINDINGS: CT CHEST FINDINGS CT CHEST FINDINGS Cardiovascular: No significant vascular findings. Normal heart size. No pericardial effusion. Mediastinum/Nodes: Small axillary lymph nodes clustered and axilla measuring 10 10 mm short axis. No supraclavicular nodes. Small mediastinal lymph nodes are present. For example prevascular precarinal lymph node measuring 13 mm short axis. Precarinal lymph node prevascular lymph node measuring 8 mm short axis. RIGHT hilar lymph node measuring 15 mm. Lungs/Pleura: Lungs are clear Musculoskeletal: No aggressive osseous lesion. CT ABDOMEN AND PELVIS FINDINGS Hepatobiliary: Low-density lesions in liver consistent benign cyst. This dilatation of the common bile duct to 11 mm following cholecystectomy. No pancreatic duct dilatation Pancreas: Pancreas is normal. No ductal dilatation. No pancreatic inflammation. Spleen: Normal spleen Adrenals/urinary tract: Adrenal glands and kidneys are normal. The ureters and bladder normal. Stomach/Bowel: Stomach, small-bowel cecum normal. The distal ileum is collapsed leading up to the terminal ileum. No clear evidence acute inflammation. There is several enlarged lymph nodes adjacent to the distal ileum. Colon and rectosigmoid colon normal Vascular/Lymphatic:  Abdominal aorta has not calcification. There is mild periaortic lymphadenopathy measuring up to 11 mm short axis LEFT all aorta. Larger lymph nodes are present within the central mesenteries along the branches of the SMA. Example lymph node in the upper pelvis/central mesenteries measuring 18 mm short axis image 86 Cluster of lymph nodes along the root of the mesentery (image 80, series 2) these mesenteric lymph nodes are not changed from comparison CT. Mild iliac adenopathy present. Lymph node in the LEFT operator space measuring 12 mm short axis compares to 15 no significant inguinal adenopathy. Reproductive:  Prostate post hysterectomy anatomy Other: No free fluid. Musculoskeletal: No aggressive osseous lesion. IMPRESSION: Chest Impression: 1. Mild mediastinal and hilar lymphadenopathy. 2. Clusters small axial lymph nodes. Abdomen / Pelvis Impression: 1. Mild periaortic retroperitoneal adenopathy and moderate central mesenteric adenopathy not changed comparison CT. 2. Mild iliac adenopathy 3. Normal volume spleen. 4. No explanation for RIGHT lower quadrant pain. Cluster of lymph nodes in the ileocecal mesenteries similar to prior. Electronically Signed   By: Suzy Bouchard M.D.   On: 08/11/2017 16:39   Ct Abdomen Pelvis W Contrast  Result Date: 08/11/2017 CLINICAL DATA:  Chronic lymphocytic leukemia. RIGHT lower quadrant discomfort for 16 weeks. EXAM: CT CHEST, ABDOMEN, AND PELVIS WITH CONTRAST TECHNIQUE: Multidetector CT imaging of the chest, abdomen and pelvis was performed following the standard protocol during bolus administration of intravenous contrast. CONTRAST:  141mL ISOVUE-300 IOPAMIDOL (ISOVUE-300)  INJECTION 61%, 22mL ISOVUE-300 IOPAMIDOL (ISOVUE-300) INJECTION 61% COMPARISON:  CT 05/22/2017 FINDINGS: CT CHEST FINDINGS CT CHEST FINDINGS Cardiovascular: No significant vascular findings. Normal heart size. No pericardial effusion. Mediastinum/Nodes: Small axillary lymph nodes clustered and axilla measuring 10 10 mm short axis. No supraclavicular nodes. Small mediastinal lymph nodes are present. For example prevascular precarinal lymph node measuring 13 mm short axis. Precarinal lymph node prevascular lymph node measuring 8 mm short axis. RIGHT hilar lymph node measuring 15 mm. Lungs/Pleura: Lungs are clear Musculoskeletal: No aggressive osseous lesion. CT ABDOMEN AND PELVIS FINDINGS Hepatobiliary: Low-density lesions in liver consistent benign cyst. This dilatation of the common bile duct to 11 mm following cholecystectomy. No pancreatic duct dilatation Pancreas: Pancreas is normal. No ductal dilatation.  No pancreatic inflammation. Spleen: Normal spleen Adrenals/urinary tract: Adrenal glands and kidneys are normal. The ureters and bladder normal. Stomach/Bowel: Stomach, small-bowel cecum normal. The distal ileum is collapsed leading up to the terminal ileum. No clear evidence acute inflammation. There is several enlarged lymph nodes adjacent to the distal ileum. Colon and rectosigmoid colon normal Vascular/Lymphatic:  Abdominal aorta has not calcification. There is mild periaortic lymphadenopathy measuring up to 11 mm short axis LEFT all aorta. Larger lymph nodes are present within the central mesenteries along the branches of the SMA. Example lymph node in the upper pelvis/central mesenteries measuring 18 mm short axis image 86 Cluster of lymph nodes along the root of the mesentery (image 80, series 2) these mesenteric lymph nodes are not changed from comparison CT. Mild iliac adenopathy present. Lymph node in the LEFT operator space measuring 12 mm short axis compares to 15 no significant inguinal adenopathy. Reproductive: Prostate post hysterectomy anatomy Other: No free fluid. Musculoskeletal: No aggressive osseous lesion. IMPRESSION: Chest Impression: 1. Mild mediastinal and hilar lymphadenopathy. 2. Clusters small axial lymph nodes. Abdomen / Pelvis Impression: 1. Mild periaortic retroperitoneal adenopathy and moderate central mesenteric adenopathy not changed comparison CT. 2. Mild iliac adenopathy 3. Normal volume spleen. 4. No explanation for RIGHT lower quadrant pain. Cluster of lymph nodes in the ileocecal mesenteries similar to prior. Electronically Signed   By: Suzy Bouchard M.D.   On: 08/11/2017 16:39   Ir US Guide Vasc Access Right  Result Date: 08/12/2017 CLINICAL DATA:  Progressive chronic lymphocytic leukemia. Port-A-Cath required for chemotherapy. EXAM: IMPLANTED PORT A CATH PLACEMENT WITH ULTRASOUND AND FLUOROSCOPIC GUIDANCE ANESTHESIA/SEDATION: 4.0 mg IV Versed; 100 mcg IV Fentanyl Total  Moderate Sedation Time:  33 minutes The patient's level of consciousness and physiologic status were continuously monitored during the procedure by Radiology nursing. Additional Medications: 2 g IV Ancef. FLUOROSCOPY TIME:  24 seconds.  2.1 mGy. PROCEDURE: The procedure, risks, benefits, and alternatives were explained to the patient. Questions regarding the procedure were encouraged and answered. The patient understands and consents to the procedure. A time-out was performed prior to initiating the procedure. Ultrasound was utilized to confirm patency of the right internal jugular vein. The right neck and chest were prepped with chlorhexidine in a sterile fashion, and a sterile drape was applied covering the operative field. Maximum barrier sterile technique with sterile gowns and gloves were used for the procedure. Local anesthesia was provided with 1% lidocaine. After creating a small venotomy incision, a 21 gauge needle was advanced into the right internal jugular vein under direct, real-time ultrasound guidance. Ultrasound image documentation was performed. After securing guidewire access, an 8 Fr dilator was placed. A J-wire was kinked to measure appropriate catheter length. A subcutaneous port pocket was then created  along the upper chest wall utilizing sharp and blunt dissection. Portable cautery was utilized. The pocket was irrigated with sterile saline. A single lumen power injectable port was chosen for placement. The 8 Fr catheter was tunneled from the port pocket site to the venotomy incision. The port was placed in the pocket. External catheter was trimmed to appropriate length based on guidewire measurement. At the venotomy, an 8 Fr peel-away sheath was placed over a guidewire. The catheter was then placed through the sheath and the sheath removed. Final catheter positioning was confirmed and documented with a fluoroscopic spot image. The port was accessed with a needle and aspirated and flushed with  heparinized saline. The access needle was removed. The venotomy and port pocket incisions were closed with subcutaneous 3-0 Monocryl and subcuticular 4-0 Vicryl. Dermabond was applied to both incisions. COMPLICATIONS: COMPLICATIONS None FINDINGS: After catheter placement, the tip lies at the cavo-atrial junction. The catheter aspirates normally and is ready for immediate use. IMPRESSION: Placement of single lumen port a cath via right internal jugular vein. The catheter tip lies at the cavo-atrial junction. A power injectable port a cath was placed and is ready for immediate use. Electronically Signed   By: Aletta Edouard M.D.   On: 08/12/2017 15:20   Ir Fluoro Guide Port Insertion Right  Result Date: 08/12/2017 CLINICAL DATA:  Progressive chronic lymphocytic leukemia. Port-A-Cath required for chemotherapy. EXAM: IMPLANTED PORT A CATH PLACEMENT WITH ULTRASOUND AND FLUOROSCOPIC GUIDANCE ANESTHESIA/SEDATION: 4.0 mg IV Versed; 100 mcg IV Fentanyl Total Moderate Sedation Time:  33 minutes The patient's level of consciousness and physiologic status were continuously monitored during the procedure by Radiology nursing. Additional Medications: 2 g IV Ancef. FLUOROSCOPY TIME:  24 seconds.  2.1 mGy. PROCEDURE: The procedure, risks, benefits, and alternatives were explained to the patient. Questions regarding the procedure were encouraged and answered. The patient understands and consents to the procedure. A time-out was performed prior to initiating the procedure. Ultrasound was utilized to confirm patency of the right internal jugular vein. The right neck and chest were prepped with chlorhexidine in a sterile fashion, and a sterile drape was applied covering the operative field. Maximum barrier sterile technique with sterile gowns and gloves were used for the procedure. Local anesthesia was provided with 1% lidocaine. After creating a small venotomy incision, a 21 gauge needle was advanced into the right internal  jugular vein under direct, real-time ultrasound guidance. Ultrasound image documentation was performed. After securing guidewire access, an 8 Fr dilator was placed. A J-wire was kinked to measure appropriate catheter length. A subcutaneous port pocket was then created along the upper chest wall utilizing sharp and blunt dissection. Portable cautery was utilized. The pocket was irrigated with sterile saline. A single lumen power injectable port was chosen for placement. The 8 Fr catheter was tunneled from the port pocket site to the venotomy incision. The port was placed in the pocket. External catheter was trimmed to appropriate length based on guidewire measurement. At the venotomy, an 8 Fr peel-away sheath was placed over a guidewire. The catheter was then placed through the sheath and the sheath removed. Final catheter positioning was confirmed and documented with a fluoroscopic spot image. The port was accessed with a needle and aspirated and flushed with heparinized saline. The access needle was removed. The venotomy and port pocket incisions were closed with subcutaneous 3-0 Monocryl and subcuticular 4-0 Vicryl. Dermabond was applied to both incisions. COMPLICATIONS: COMPLICATIONS None FINDINGS: After catheter placement, the tip lies at the cavo-atrial  junction. The catheter aspirates normally and is ready for immediate use. IMPRESSION: Placement of single lumen port a cath via right internal jugular vein. The catheter tip lies at the cavo-atrial junction. A power injectable port a cath was placed and is ready for immediate use. Electronically Signed   By: Aletta Edouard M.D.   On: 08/12/2017 15:20    ASSESSMENT & PLAN:  CLL (chronic lymphocytic leukemia) She tolerated treatment very well with no side effects Blood work showed complete remission with resolution of lymphocytosis The patient is pleasantly surprised She will continue acyclovir for antimicrobial prophylaxis along with allopurinol for  tumor lysis prophylaxis I recommend discontinuation of dexamethasone I will see her back prior to cycle 2 of treatment for further assessment  Thrombocytopenia ([place]) This is likely secondary to mild splenomegaly Observe only She is not symptomatic  Other constipation This is likely secondary to side effects of treatment Recommend aggressive laxative therapy   No orders of the defined types were placed in this encounter.  All questions were answered. The patient knows to call the clinic with any problems, questions or concerns. No barriers to learning was detected. I spent 15 minutes counseling the patient face to face. The total time spent in the appointment was 20 minutes and more than 50% was on counseling and review of test results     Heath Lark, MD 08/22/2017 11:13 AM

## 2017-08-22 NOTE — Assessment & Plan Note (Signed)
This is likely secondary to mild splenomegaly Observe only She is not symptomatic

## 2017-08-22 NOTE — Assessment & Plan Note (Signed)
This is likely secondary to side effects of treatment Recommend aggressive laxative therapy

## 2017-08-26 ENCOUNTER — Telehealth: Payer: Self-pay | Admitting: *Deleted

## 2017-08-26 NOTE — Telephone Encounter (Signed)
Notified of message below. Verbalized understanding 

## 2017-08-26 NOTE — Telephone Encounter (Signed)
More likely due to recent stopping steroids Her recent CBC is normal I recommend conservative management only with tylenol prn Is she starts to have cough or fever > 101 let us know

## 2017-08-26 NOTE — Telephone Encounter (Signed)
Amanda [name] states she has had a headache for 3 days. Temp has been 100.3 and 100.4. Goes down when she takes tylenol. Feeling achy through shoulders.  Is wondering if she is "coming down with something".

## 2017-08-27 ENCOUNTER — Telehealth: Payer: Self-pay | Admitting: *Deleted

## 2017-08-27 NOTE — Telephone Encounter (Signed)
Hard to evaluate without seeing her I would recommend seeing her tomorrow. A slot just open tomorrow morning around 9 am. No need labs For today, she can take benadryl 25 mg TID/PRN PO for itching Please place scheduling msg if OK with her

## 2017-08-27 NOTE — Telephone Encounter (Signed)
Notified of message below. Message to scheduler 

## 2017-08-27 NOTE — Telephone Encounter (Signed)
Amanda [name] states her headache and fever are resolved. But she woke up with a fine red rash all over- face, arms, leg and chest. Arms are slightly itchy.

## 2017-08-28 ENCOUNTER — Ambulatory Visit (HOSPITAL_BASED_OUTPATIENT_CLINIC_OR_DEPARTMENT_OTHER): Payer: Medicare Other | Admitting: Hematology and Oncology

## 2017-08-28 ENCOUNTER — Encounter: Payer: Self-pay | Admitting: Hematology and Oncology

## 2017-08-28 DIAGNOSIS — C9190 Lymphoid leukemia, unspecified not having achieved remission: Secondary | ICD-10-CM

## 2017-08-28 DIAGNOSIS — C9110 Chronic lymphocytic leukemia of B-cell type not having achieved remission: Secondary | ICD-10-CM

## 2017-08-28 DIAGNOSIS — R21 Rash and other nonspecific skin eruption: Secondary | ICD-10-CM

## 2017-08-28 NOTE — Progress Notes (Signed)
[facility] OFFICE PROGRESS NOTE  Patient Care Team: Cari Caraway, MD as PCP - General (Family Medicine) Irine Seal, MD as Attending Physician (Urology) Jacolyn Reedy, MD as Attending Physician (Cardiology) Heath Lark, MD as Consulting Physician (Hematology and Oncology)  SUMMARY OF ONCOLOGIC HISTORY: Oncology History   Del 13q     CLL (chronic lymphocytic leukemia) ([place])   02/09/2013 Pathology Results    Peripheral Blood Flow Cytometry - FINDINGS CONSISTENT WITH CHRONIC LYMPHOCYTIC LEUKEMIA      09/02/2013 Pathology Results    FISH positive for deletion 13 q      05/22/2017 Imaging    Worsening mesenteric and retroperitoneal adenopathy concerning for worsening lymphoproliferative disorder/lymphoma.  Small cysts in the liver and kidneys are stable.  Prior cholecystectomy.  Aortic atherosclerosis.  Left colonic diverticulosis without active diverticulitis.      08/11/2017 Imaging    Chest Impression:  1. Mild mediastinal and hilar lymphadenopathy. 2. Clusters small axial lymph nodes.  Abdomen / Pelvis Impression:  1. Mild periaortic retroperitoneal adenopathy and moderate central mesenteric adenopathy not changed comparison CT. 2. Mild iliac adenopathy 3. Normal volume spleen. 4. No explanation for RIGHT lower quadrant pain. Cluster of lymph nodes in the ileocecal mesenteries similar to prior.      08/12/2017 Procedure    Placement of single lumen port a cath via right internal jugular vein. The catheter tip lies at the cavo-atrial junction. A power injectable port a cath was placed and is ready for immediate use.      08/14/2017 -  Chemotherapy    She received Bendamustine and Rituxan       INTERVAL HISTORY: Please see below for problem oriented charting. She requested urgent evaluation due to diffuse skin rash The patient has background history of seborrhoic keratosis Since recent chemotherapy, she noted diffuse flare of redness of  her skin She denies itchiness No blisters  REVIEW OF SYSTEMS:   Constitutional: Denies fevers, chills or abnormal weight loss Eyes: Denies blurriness of vision Ears, nose, mouth, throat, and face: Denies mucositis or sore throat Respiratory: Denies cough, dyspnea or wheezes Cardiovascular: Denies palpitation, chest discomfort or lower extremity swelling Gastrointestinal:  Denies nausea, heartburn or change in bowel habits Lymphatics: Denies new lymphadenopathy or easy bruising Neurological:Denies numbness, tingling or new weaknesses Behavioral/Psych: Mood is stable, no new changes  All other systems were reviewed with the patient and are negative.  I have reviewed the past medical history, past surgical history, social history and family history with the patient and they are unchanged from previous note.  ALLERGIES:  is allergic to codeine; demerol [meperidine]; morphine and related; ciprofloxacin; and sulfa antibiotics.  MEDICATIONS:  Current Outpatient Prescriptions  Medication Sig Dispense Refill  . acetaminophen (TYLENOL) 500 MG tablet Take 500 mg by mouth every 6 (six) hours as needed for pain.    Marland Kitchen acyclovir (ZOVIRAX) 400 MG tablet Take 1 tablet (400 mg total) by mouth daily. 30 tablet 3  . allopurinol (ZYLOPRIM) 300 MG tablet Take 1 tablet (300 mg total) by mouth daily. 30 tablet 3  . aspirin 81 MG tablet Take 81 mg by mouth daily.    Marland Kitchen atorvastatin (LIPITOR) 40 MG tablet Take 40 mg by mouth daily.    . Calcium Carb-Cholecalciferol (CALCIUM + D3) 600-200 MG-UNIT TABS Take 1 tablet by mouth 3 (three) times a week.     Marland Kitchen dexamethasone (DECADRON) 4 MG tablet Take 1 tablet (4 mg total) by mouth daily. Take 1 daily with food in  the morning 30 tablet 1  . DIGOX 0.125 MG tablet Take 0.125 mg by mouth daily.    . fluticasone (FLONASE) 50 MCG/ACT nasal spray Place 2 sprays into the nose daily.    . folic acid (FOLVITE) 1 MG tablet Take 1 mg by mouth daily.    . hydrochlorothiazide  (HYDRODIURIL) 12.5 MG tablet Take 12.5 mg by mouth daily.    Marland Kitchen ketotifen (ZADITOR) 0.025 % ophthalmic solution Apply 1 drop to eye 2 (two) times daily.    Marland Kitchen lidocaine-prilocaine (EMLA) cream Apply to affected area once 30 g 3  . metoprolol (LOPRESSOR) 50 MG tablet Take 50 mg by mouth 2 (two) times daily.    . Multiple Vitamin (MULTIVITAMIN) tablet Take 1 tablet by mouth daily.    . Multiple Vitamins-Minerals (EYE VITAMINS & MINERALS) TABS Take by mouth.    . ondansetron (ZOFRAN) 8 MG tablet Take 1 tablet (8 mg total) by mouth every 8 (eight) hours as needed for refractory nausea / vomiting. 30 tablet 1  . potassium chloride SA (K-DUR,KLOR-CON) 20 MEQ tablet Take 20 mEq by mouth 3 (three) times a week. Mon, Wed, Fri    . prochlorperazine (COMPAZINE) 10 MG tablet Take 1 tablet (10 mg total) by mouth every 6 (six) hours as needed (Nausea or vomiting). 30 tablet 1   No current facility-administered medications for this visit.     PHYSICAL EXAMINATION: ECOG PERFORMANCE STATUS: 0 - Asymptomatic  Vitals:   08/28/17 0906  BP: (!) 144/59  Pulse: 79  Resp: 18  Temp: 98.7 F (37.1 C)  SpO2: 98%   Filed Weights   08/28/17 0906  Weight: 140 lb 12.8 oz (63.9 kg)    GENERAL:alert, no distress and comfortable SKIN: Noted diffuse skin rash consistent with flare of seborrheic keratosis.  It does not resemble drug rash or shingles EYES: normal, Conjunctiva are pink and non-injected, sclera clear Musculoskeletal:no cyanosis of digits and no clubbing  NEURO: alert & oriented x 3 with fluent speech, no focal motor/sensory deficits  LABORATORY DATA:  I have reviewed the data as listed    Component Value Date/Time   NA 140 08/21/2017 1306   K 3.6 08/21/2017 1306   CL 104 08/12/2017 0955   CL 104 02/08/2013 1454   CO2 28 08/21/2017 1306   GLUCOSE 115 08/21/2017 1306   GLUCOSE 103 (H) 02/08/2013 1454   BUN 21.7 08/21/2017 1306   CREATININE 0.9 08/21/2017 1306   CALCIUM 9.7 08/21/2017 1306    PROT 6.3 (L) 08/21/2017 1306   ALBUMIN 3.6 08/21/2017 1306   AST 16 08/21/2017 1306   ALT 20 08/21/2017 1306   ALKPHOS 65 08/21/2017 1306   BILITOT 1.17 08/21/2017 1306   GFRNONAA >60 08/12/2017 0955   GFRAA >60 08/12/2017 0955    No results found for: SPEP, UPEP  Lab Results  Component Value Date   WBC 4.8 08/21/2017   NEUTROABS 4.1 08/21/2017   HGB 12.4 08/21/2017   HCT 36.9 08/21/2017   MCV 83.0 08/21/2017   PLT 132 (L) 08/21/2017      Chemistry      Component Value Date/Time   NA 140 08/21/2017 1306   K 3.6 08/21/2017 1306   CL 104 08/12/2017 0955   CL 104 02/08/2013 1454   CO2 28 08/21/2017 1306   BUN 21.7 08/21/2017 1306   CREATININE 0.9 08/21/2017 1306      Component Value Date/Time   CALCIUM 9.7 08/21/2017 1306   ALKPHOS 65 08/21/2017 1306   AST  16 08/21/2017 1306   ALT 20 08/21/2017 1306   BILITOT 1.17 08/21/2017 1306       RADIOGRAPHIC STUDIES: I have personally reviewed the radiological images as listed and agreed with the findings in the report. Ct Chest W Contrast  Result Date: 08/11/2017 CLINICAL DATA:  Chronic lymphocytic leukemia. RIGHT lower quadrant discomfort for 16 weeks. EXAM: CT CHEST, ABDOMEN, AND PELVIS WITH CONTRAST TECHNIQUE: Multidetector CT imaging of the chest, abdomen and pelvis was performed following the standard protocol during bolus administration of intravenous contrast. CONTRAST:  130mL ISOVUE-300 IOPAMIDOL (ISOVUE-300) INJECTION 61%, 80mL ISOVUE-300 IOPAMIDOL (ISOVUE-300) INJECTION 61% COMPARISON:  CT 05/22/2017 FINDINGS: CT CHEST FINDINGS CT CHEST FINDINGS Cardiovascular: No significant vascular findings. Normal heart size. No pericardial effusion. Mediastinum/Nodes: Small axillary lymph nodes clustered and axilla measuring 10 10 mm short axis. No supraclavicular nodes. Small mediastinal lymph nodes are present. For example prevascular precarinal lymph node measuring 13 mm short axis. Precarinal lymph node prevascular lymph node  measuring 8 mm short axis. RIGHT hilar lymph node measuring 15 mm. Lungs/Pleura: Lungs are clear Musculoskeletal: No aggressive osseous lesion. CT ABDOMEN AND PELVIS FINDINGS Hepatobiliary: Low-density lesions in liver consistent benign cyst. This dilatation of the common bile duct to 11 mm following cholecystectomy. No pancreatic duct dilatation Pancreas: Pancreas is normal. No ductal dilatation. No pancreatic inflammation. Spleen: Normal spleen Adrenals/urinary tract: Adrenal glands and kidneys are normal. The ureters and bladder normal. Stomach/Bowel: Stomach, small-bowel cecum normal. The distal ileum is collapsed leading up to the terminal ileum. No clear evidence acute inflammation. There is several enlarged lymph nodes adjacent to the distal ileum. Colon and rectosigmoid colon normal Vascular/Lymphatic:  Abdominal aorta has not calcification. There is mild periaortic lymphadenopathy measuring up to 11 mm short axis LEFT all aorta. Larger lymph nodes are present within the central mesenteries along the branches of the SMA. Example lymph node in the upper pelvis/central mesenteries measuring 18 mm short axis image 86 Cluster of lymph nodes along the root of the mesentery (image 80, series 2) these mesenteric lymph nodes are not changed from comparison CT. Mild iliac adenopathy present. Lymph node in the LEFT operator space measuring 12 mm short axis compares to 15 no significant inguinal adenopathy. Reproductive: Prostate post hysterectomy anatomy Other: No free fluid. Musculoskeletal: No aggressive osseous lesion. IMPRESSION: Chest Impression: 1. Mild mediastinal and hilar lymphadenopathy. 2. Clusters small axial lymph nodes. Abdomen / Pelvis Impression: 1. Mild periaortic retroperitoneal adenopathy and moderate central mesenteric adenopathy not changed comparison CT. 2. Mild iliac adenopathy 3. Normal volume spleen. 4. No explanation for RIGHT lower quadrant pain. Cluster of lymph nodes in the ileocecal  mesenteries similar to prior. Electronically Signed   By: Suzy Bouchard M.D.   On: 08/11/2017 16:39   Ct Abdomen Pelvis W Contrast  Result Date: 08/11/2017 CLINICAL DATA:  Chronic lymphocytic leukemia. RIGHT lower quadrant discomfort for 16 weeks. EXAM: CT CHEST, ABDOMEN, AND PELVIS WITH CONTRAST TECHNIQUE: Multidetector CT imaging of the chest, abdomen and pelvis was performed following the standard protocol during bolus administration of intravenous contrast. CONTRAST:  162mL ISOVUE-300 IOPAMIDOL (ISOVUE-300) INJECTION 61%, 23mL ISOVUE-300 IOPAMIDOL (ISOVUE-300) INJECTION 61% COMPARISON:  CT 05/22/2017 FINDINGS: CT CHEST FINDINGS CT CHEST FINDINGS Cardiovascular: No significant vascular findings. Normal heart size. No pericardial effusion. Mediastinum/Nodes: Small axillary lymph nodes clustered and axilla measuring 10 10 mm short axis. No supraclavicular nodes. Small mediastinal lymph nodes are present. For example prevascular precarinal lymph node measuring 13 mm short axis. Precarinal lymph node prevascular lymph node measuring  8 mm short axis. RIGHT hilar lymph node measuring 15 mm. Lungs/Pleura: Lungs are clear Musculoskeletal: No aggressive osseous lesion. CT ABDOMEN AND PELVIS FINDINGS Hepatobiliary: Low-density lesions in liver consistent benign cyst. This dilatation of the common bile duct to 11 mm following cholecystectomy. No pancreatic duct dilatation Pancreas: Pancreas is normal. No ductal dilatation. No pancreatic inflammation. Spleen: Normal spleen Adrenals/urinary tract: Adrenal glands and kidneys are normal. The ureters and bladder normal. Stomach/Bowel: Stomach, small-bowel cecum normal. The distal ileum is collapsed leading up to the terminal ileum. No clear evidence acute inflammation. There is several enlarged lymph nodes adjacent to the distal ileum. Colon and rectosigmoid colon normal Vascular/Lymphatic:  Abdominal aorta has not calcification. There is mild periaortic lymphadenopathy  measuring up to 11 mm short axis LEFT all aorta. Larger lymph nodes are present within the central mesenteries along the branches of the SMA. Example lymph node in the upper pelvis/central mesenteries measuring 18 mm short axis image 86 Cluster of lymph nodes along the root of the mesentery (image 80, series 2) these mesenteric lymph nodes are not changed from comparison CT. Mild iliac adenopathy present. Lymph node in the LEFT operator space measuring 12 mm short axis compares to 15 no significant inguinal adenopathy. Reproductive: Prostate post hysterectomy anatomy Other: No free fluid. Musculoskeletal: No aggressive osseous lesion. IMPRESSION: Chest Impression: 1. Mild mediastinal and hilar lymphadenopathy. 2. Clusters small axial lymph nodes. Abdomen / Pelvis Impression: 1. Mild periaortic retroperitoneal adenopathy and moderate central mesenteric adenopathy not changed comparison CT. 2. Mild iliac adenopathy 3. Normal volume spleen. 4. No explanation for RIGHT lower quadrant pain. Cluster of lymph nodes in the ileocecal mesenteries similar to prior. Electronically Signed   By: Suzy Bouchard M.D.   On: 08/11/2017 16:39   Ir US Guide Vasc Access Right  Result Date: 08/12/2017 CLINICAL DATA:  Progressive chronic lymphocytic leukemia. Port-A-Cath required for chemotherapy. EXAM: IMPLANTED PORT A CATH PLACEMENT WITH ULTRASOUND AND FLUOROSCOPIC GUIDANCE ANESTHESIA/SEDATION: 4.0 mg IV Versed; 100 mcg IV Fentanyl Total Moderate Sedation Time:  33 minutes The patient's level of consciousness and physiologic status were continuously monitored during the procedure by Radiology nursing. Additional Medications: 2 g IV Ancef. FLUOROSCOPY TIME:  24 seconds.  2.1 mGy. PROCEDURE: The procedure, risks, benefits, and alternatives were explained to the patient. Questions regarding the procedure were encouraged and answered. The patient understands and consents to the procedure. A time-out was performed prior to initiating  the procedure. Ultrasound was utilized to confirm patency of the right internal jugular vein. The right neck and chest were prepped with chlorhexidine in a sterile fashion, and a sterile drape was applied covering the operative field. Maximum barrier sterile technique with sterile gowns and gloves were used for the procedure. Local anesthesia was provided with 1% lidocaine. After creating a small venotomy incision, a 21 gauge needle was advanced into the right internal jugular vein under direct, real-time ultrasound guidance. Ultrasound image documentation was performed. After securing guidewire access, an 8 Fr dilator was placed. A J-wire was kinked to measure appropriate catheter length. A subcutaneous port pocket was then created along the upper chest wall utilizing sharp and blunt dissection. Portable cautery was utilized. The pocket was irrigated with sterile saline. A single lumen power injectable port was chosen for placement. The 8 Fr catheter was tunneled from the port pocket site to the venotomy incision. The port was placed in the pocket. External catheter was trimmed to appropriate length based on guidewire measurement. At the venotomy, an 8 Fr peel-away  sheath was placed over a guidewire. The catheter was then placed through the sheath and the sheath removed. Final catheter positioning was confirmed and documented with a fluoroscopic spot image. The port was accessed with a needle and aspirated and flushed with heparinized saline. The access needle was removed. The venotomy and port pocket incisions were closed with subcutaneous 3-0 Monocryl and subcuticular 4-0 Vicryl. Dermabond was applied to both incisions. COMPLICATIONS: COMPLICATIONS None FINDINGS: After catheter placement, the tip lies at the cavo-atrial junction. The catheter aspirates normally and is ready for immediate use. IMPRESSION: Placement of single lumen port a cath via right internal jugular vein. The catheter tip lies at the  cavo-atrial junction. A power injectable port a cath was placed and is ready for immediate use. Electronically Signed   By: Aletta Edouard M.D.   On: 08/12/2017 15:20   Ir Fluoro Guide Port Insertion Right  Result Date: 08/12/2017 CLINICAL DATA:  Progressive chronic lymphocytic leukemia. Port-A-Cath required for chemotherapy. EXAM: IMPLANTED PORT A CATH PLACEMENT WITH ULTRASOUND AND FLUOROSCOPIC GUIDANCE ANESTHESIA/SEDATION: 4.0 mg IV Versed; 100 mcg IV Fentanyl Total Moderate Sedation Time:  33 minutes The patient's level of consciousness and physiologic status were continuously monitored during the procedure by Radiology nursing. Additional Medications: 2 g IV Ancef. FLUOROSCOPY TIME:  24 seconds.  2.1 mGy. PROCEDURE: The procedure, risks, benefits, and alternatives were explained to the patient. Questions regarding the procedure were encouraged and answered. The patient understands and consents to the procedure. A time-out was performed prior to initiating the procedure. Ultrasound was utilized to confirm patency of the right internal jugular vein. The right neck and chest were prepped with chlorhexidine in a sterile fashion, and a sterile drape was applied covering the operative field. Maximum barrier sterile technique with sterile gowns and gloves were used for the procedure. Local anesthesia was provided with 1% lidocaine. After creating a small venotomy incision, a 21 gauge needle was advanced into the right internal jugular vein under direct, real-time ultrasound guidance. Ultrasound image documentation was performed. After securing guidewire access, an 8 Fr dilator was placed. A J-wire was kinked to measure appropriate catheter length. A subcutaneous port pocket was then created along the upper chest wall utilizing sharp and blunt dissection. Portable cautery was utilized. The pocket was irrigated with sterile saline. A single lumen power injectable port was chosen for placement. The 8 Fr catheter was  tunneled from the port pocket site to the venotomy incision. The port was placed in the pocket. External catheter was trimmed to appropriate length based on guidewire measurement. At the venotomy, an 8 Fr peel-away sheath was placed over a guidewire. The catheter was then placed through the sheath and the sheath removed. Final catheter positioning was confirmed and documented with a fluoroscopic spot image. The port was accessed with a needle and aspirated and flushed with heparinized saline. The access needle was removed. The venotomy and port pocket incisions were closed with subcutaneous 3-0 Monocryl and subcuticular 4-0 Vicryl. Dermabond was applied to both incisions. COMPLICATIONS: COMPLICATIONS None FINDINGS: After catheter placement, the tip lies at the cavo-atrial junction. The catheter aspirates normally and is ready for immediate use. IMPRESSION: Placement of single lumen port a cath via right internal jugular vein. The catheter tip lies at the cavo-atrial junction. A power injectable port a cath was placed and is ready for immediate use. Electronically Signed   By: Aletta Edouard M.D.   On: 08/12/2017 15:20    ASSESSMENT & PLAN:  Skin rash The skin  rash is not due to infection or recent treatment It is not uncommon to see a flare of seborrheic keratosis after recent chemotherapy I reassured the patient this is not a drug reaction Recommend conservative management only Alternatively, she can also take steroid prescription  CLL (chronic lymphocytic leukemia) She tolerated treatment very well with no side effects Blood work showed complete remission with resolution of lymphocytosis I will see her back prior to cycle 2 of treatment for further assessment   No orders of the defined types were placed in this encounter.  All questions were answered. The patient knows to call the clinic with any problems, questions or concerns. No barriers to learning was detected. I spent 10 minutes  counseling the patient face to face. The total time spent in the appointment was 15 minutes and more than 50% was on counseling and review of test results     Heath Lark, MD 08/28/2017 2:36 PM

## 2017-08-28 NOTE — Assessment & Plan Note (Signed)
The skin rash is not due to infection or recent treatment It is not uncommon to see a flare of seborrheic keratosis after recent chemotherapy I reassured the patient this is not a drug reaction Recommend conservative management only Alternatively, she can also take steroid prescription

## 2017-08-28 NOTE — Assessment & Plan Note (Signed)
She tolerated treatment very well with no side effects Blood work showed complete remission with resolution of lymphocytosis I will see her back prior to cycle 2 of treatment for further assessment

## 2017-09-05 DIAGNOSIS — E7849 Other hyperlipidemia: Secondary | ICD-10-CM | POA: Diagnosis not present

## 2017-09-05 DIAGNOSIS — I119 Hypertensive heart disease without heart failure: Secondary | ICD-10-CM | POA: Diagnosis not present

## 2017-09-05 DIAGNOSIS — I480 Paroxysmal atrial fibrillation: Secondary | ICD-10-CM | POA: Diagnosis not present

## 2017-09-11 ENCOUNTER — Encounter: Payer: Self-pay | Admitting: Hematology and Oncology

## 2017-09-11 ENCOUNTER — Ambulatory Visit (HOSPITAL_BASED_OUTPATIENT_CLINIC_OR_DEPARTMENT_OTHER): Payer: Medicare Other | Admitting: Hematology and Oncology

## 2017-09-11 ENCOUNTER — Ambulatory Visit: Payer: Medicare Other

## 2017-09-11 ENCOUNTER — Other Ambulatory Visit (HOSPITAL_BASED_OUTPATIENT_CLINIC_OR_DEPARTMENT_OTHER): Payer: Medicare Other

## 2017-09-11 ENCOUNTER — Telehealth: Payer: Self-pay | Admitting: Hematology and Oncology

## 2017-09-11 ENCOUNTER — Ambulatory Visit (HOSPITAL_BASED_OUTPATIENT_CLINIC_OR_DEPARTMENT_OTHER): Payer: Medicare Other

## 2017-09-11 VITALS — BP 103/46 | HR 54 | Temp 98.6°F | Resp 18

## 2017-09-11 DIAGNOSIS — Z95828 Presence of other vascular implants and grafts: Secondary | ICD-10-CM

## 2017-09-11 DIAGNOSIS — C9110 Chronic lymphocytic leukemia of B-cell type not having achieved remission: Secondary | ICD-10-CM

## 2017-09-11 DIAGNOSIS — R21 Rash and other nonspecific skin eruption: Secondary | ICD-10-CM

## 2017-09-11 DIAGNOSIS — Z5112 Encounter for antineoplastic immunotherapy: Secondary | ICD-10-CM | POA: Diagnosis not present

## 2017-09-11 DIAGNOSIS — T451X5A Adverse effect of antineoplastic and immunosuppressive drugs, initial encounter: Secondary | ICD-10-CM | POA: Insufficient documentation

## 2017-09-11 DIAGNOSIS — D6481 Anemia due to antineoplastic chemotherapy: Secondary | ICD-10-CM | POA: Diagnosis not present

## 2017-09-11 DIAGNOSIS — Z5111 Encounter for antineoplastic chemotherapy: Secondary | ICD-10-CM | POA: Diagnosis not present

## 2017-09-11 LAB — COMPREHENSIVE METABOLIC PANEL
ALK PHOS: 65 U/L (ref 40–150)
ALT: 23 U/L (ref 0–55)
AST: 15 U/L (ref 5–34)
Albumin: 3.6 g/dL (ref 3.5–5.0)
Anion Gap: 7 mEq/L (ref 3–11)
BILIRUBIN TOTAL: 0.83 mg/dL (ref 0.20–1.20)
BUN: 23.4 mg/dL (ref 7.0–26.0)
CO2: 27 mEq/L (ref 22–29)
Calcium: 9.2 mg/dL (ref 8.4–10.4)
Chloride: 104 mEq/L (ref 98–109)
Creatinine: 0.8 mg/dL (ref 0.6–1.1)
Glucose: 101 mg/dl (ref 70–140)
Potassium: 3.5 mEq/L (ref 3.5–5.1)
Sodium: 139 mEq/L (ref 136–145)
Total Protein: 6 g/dL — ABNORMAL LOW (ref 6.4–8.3)

## 2017-09-11 LAB — CBC WITH DIFFERENTIAL/PLATELET
BASO%: 0.6 % (ref 0.0–2.0)
BASOS ABS: 0 10*3/uL (ref 0.0–0.1)
EOS ABS: 0.1 10*3/uL (ref 0.0–0.5)
EOS%: 0.7 % (ref 0.0–7.0)
HCT: 33.1 % — ABNORMAL LOW (ref 34.8–46.6)
HGB: 11.1 g/dL — ABNORMAL LOW (ref 11.6–15.9)
LYMPH%: 15.2 % (ref 14.0–49.7)
MCH: 28.4 pg (ref 25.1–34.0)
MCHC: 33.5 g/dL (ref 31.5–36.0)
MCV: 84.7 fL (ref 79.5–101.0)
MONO#: 1 10*3/uL — ABNORMAL HIGH (ref 0.1–0.9)
MONO%: 14.3 % — AB (ref 0.0–14.0)
NEUT%: 69.2 % (ref 38.4–76.8)
NEUTROS ABS: 4.8 10*3/uL (ref 1.5–6.5)
PLATELETS: 148 10*3/uL (ref 145–400)
RBC: 3.91 10*6/uL (ref 3.70–5.45)
RDW: 15.6 % — ABNORMAL HIGH (ref 11.2–14.5)
WBC: 7 10*3/uL (ref 3.9–10.3)
lymph#: 1.1 10*3/uL (ref 0.9–3.3)
nRBC: 0 % (ref 0–0)

## 2017-09-11 LAB — TECHNOLOGIST REVIEW

## 2017-09-11 LAB — URIC ACID: URIC ACID, SERUM: 3.7 mg/dL (ref 2.6–7.4)

## 2017-09-11 MED ORDER — ACETAMINOPHEN 325 MG PO TABS
650.0000 mg | ORAL_TABLET | Freq: Once | ORAL | Status: AC
  Administered 2017-09-11: 650 mg via ORAL

## 2017-09-11 MED ORDER — BENDAMUSTINE HCL CHEMO INJECTION 100 MG/4ML
90.0000 mg/m2 | Freq: Once | INTRAVENOUS | Status: AC
  Administered 2017-09-11: 150 mg via INTRAVENOUS
  Filled 2017-09-11: qty 6

## 2017-09-11 MED ORDER — DEXAMETHASONE SODIUM PHOSPHATE 10 MG/ML IJ SOLN
INTRAMUSCULAR | Status: AC
  Filled 2017-09-11: qty 1

## 2017-09-11 MED ORDER — SODIUM CHLORIDE 0.9 % IV SOLN
375.0000 mg/m2 | Freq: Once | INTRAVENOUS | Status: AC
  Administered 2017-09-11: 600 mg via INTRAVENOUS
  Filled 2017-09-11: qty 50

## 2017-09-11 MED ORDER — SODIUM CHLORIDE 0.9% FLUSH
10.0000 mL | INTRAVENOUS | Status: DC | PRN
  Administered 2017-09-11: 10 mL
  Filled 2017-09-11: qty 10

## 2017-09-11 MED ORDER — ACETAMINOPHEN 325 MG PO TABS
ORAL_TABLET | ORAL | Status: AC
  Filled 2017-09-11: qty 2

## 2017-09-11 MED ORDER — DIPHENHYDRAMINE HCL 25 MG PO CAPS
50.0000 mg | ORAL_CAPSULE | Freq: Once | ORAL | Status: AC
  Administered 2017-09-11: 50 mg via ORAL

## 2017-09-11 MED ORDER — DIPHENHYDRAMINE HCL 25 MG PO CAPS
ORAL_CAPSULE | ORAL | Status: AC
  Filled 2017-09-11: qty 2

## 2017-09-11 MED ORDER — SODIUM CHLORIDE 0.9% FLUSH
10.0000 mL | INTRAVENOUS | Status: DC | PRN
  Administered 2017-09-11: 10 mL via INTRAVENOUS
  Filled 2017-09-11: qty 10

## 2017-09-11 MED ORDER — PALONOSETRON HCL INJECTION 0.25 MG/5ML
INTRAVENOUS | Status: AC
  Filled 2017-09-11: qty 5

## 2017-09-11 MED ORDER — DEXAMETHASONE SODIUM PHOSPHATE 10 MG/ML IJ SOLN
10.0000 mg | Freq: Once | INTRAMUSCULAR | Status: AC
  Administered 2017-09-11: 10 mg via INTRAVENOUS

## 2017-09-11 MED ORDER — HEPARIN SOD (PORK) LOCK FLUSH 100 UNIT/ML IV SOLN
500.0000 [IU] | Freq: Once | INTRAVENOUS | Status: AC | PRN
  Administered 2017-09-11: 500 [IU]
  Filled 2017-09-11: qty 5

## 2017-09-11 MED ORDER — PALONOSETRON HCL INJECTION 0.25 MG/5ML
0.2500 mg | Freq: Once | INTRAVENOUS | Status: AC
  Administered 2017-09-11: 0.25 mg via INTRAVENOUS

## 2017-09-11 MED ORDER — SODIUM CHLORIDE 0.9 % IV SOLN
Freq: Once | INTRAVENOUS | Status: AC
  Administered 2017-09-11: 10:00:00 via INTRAVENOUS

## 2017-09-11 NOTE — Assessment & Plan Note (Signed)
This is likely due to recent treatment. The patient denies recent history of bleeding such as epistaxis, hematuria or hematochezia. She is asymptomatic from the anemia. I will observe for now.   

## 2017-09-11 NOTE — Patient Instructions (Signed)
[facility] Discharge Instructions for Patients Receiving Chemotherapy  Today you received the following chemotherapy agents Rituxan and Bendika  To help prevent nausea and vomiting after your treatment, we encourage you to take your nausea medication as directed If you develop nausea and vomiting that is not controlled by your nausea medication, call the clinic.   BELOW ARE SYMPTOMS THAT SHOULD BE REPORTED IMMEDIATELY:  *FEVER GREATER THAN 100.5 F  *CHILLS WITH OR WITHOUT FEVER  NAUSEA AND VOMITING THAT IS NOT CONTROLLED WITH YOUR NAUSEA MEDICATION  *UNUSUAL SHORTNESS OF BREATH  *UNUSUAL BRUISING OR BLEEDING  TENDERNESS IN MOUTH AND THROAT WITH OR WITHOUT PRESENCE OF ULCERS  *URINARY PROBLEMS  *BOWEL PROBLEMS  UNUSUAL RASH Items with * indicate a potential emergency and should be followed up as soon as possible.  Feel free to call the clinic should you have any questions or concerns. The clinic phone number is [phone].  Please show the [facility] at check-in to the Emergency Department and triage nurse.

## 2017-09-11 NOTE — Assessment & Plan Note (Signed)
The skin rash is not due to infection or recent treatment It is not uncommon to see a flare of seborrheic keratosis after recent chemotherapy I reassured the patient this is not a drug reaction Recommend conservative management only

## 2017-09-11 NOTE — Telephone Encounter (Signed)
Scheduled appt per 1/11 los - Gave patient AVS and calender per los.  

## 2017-09-11 NOTE — Assessment & Plan Note (Signed)
The patient has complete hematology good response with only 1 cycle of treatment I have told her to discontinue dexamethasone She will finish her current prescription of allopurinol Plan would be to complete 6 cycles of chemotherapy before repeat imaging study

## 2017-09-11 NOTE — Patient Instructions (Signed)
Implanted Port Home Guide An implanted port is a type of central line that is placed under the skin. Central lines are used to provide IV access when treatment or nutrition needs to be given through a person's veins. Implanted ports are used for long-term IV access. An implanted port may be placed because:  You need IV medicine that would be irritating to the small veins in your hands or arms.  You need long-term IV medicines, such as antibiotics.  You need IV nutrition for a long period.  You need frequent blood draws for lab tests.  You need dialysis.  Implanted ports are usually placed in the chest area, but they can also be placed in the upper arm, the abdomen, or the leg. An implanted port has two main parts:  Reservoir. The reservoir is round and will appear as a small, raised area under your skin. The reservoir is the part where a needle is inserted to give medicines or draw blood.  Catheter. The catheter is a thin, flexible tube that extends from the reservoir. The catheter is placed into a large vein. Medicine that is inserted into the reservoir goes into the catheter and then into the vein.  How will I care for my incision site? Do not get the incision site wet. Bathe or shower as directed by your health care provider. How is my port accessed? Special steps must be taken to access the port:  Before the port is accessed, a numbing cream can be placed on the skin. This helps numb the skin over the port site.  Your health care provider uses a sterile technique to access the port. ? Your health care provider must put on a mask and sterile gloves. ? The skin over your port is cleaned carefully with an antiseptic and allowed to dry. ? The port is gently pinched between sterile gloves, and a needle is inserted into the port.  Only "non-coring" port needles should be used to access the port. Once the port is accessed, a blood return should be checked. This helps ensure that the port  is in the vein and is not clogged.  If your port needs to remain accessed for a constant infusion, a clear (transparent) bandage will be placed over the needle site. The bandage and needle will need to be changed every week, or as directed by your health care provider.  Keep the bandage covering the needle clean and dry. Do not get it wet. Follow your health care provider's instructions on how to take a shower or bath while the port is accessed.  If your port does not need to stay accessed, no bandage is needed over the port.  What is flushing? Flushing helps keep the port from getting clogged. Follow your health care provider's instructions on how and when to flush the port. Ports are usually flushed with saline solution or a medicine called heparin. The need for flushing will depend on how the port is used.  If the port is used for intermittent medicines or blood draws, the port will need to be flushed: ? After medicines have been given. ? After blood has been drawn. ? As part of routine maintenance.  If a constant infusion is running, the port may not need to be flushed.  How long will my port stay implanted? The port can stay in for as long as your health care provider thinks it is needed. When it is time for the port to come out, surgery will be   done to remove it. The procedure is similar to the one performed when the port was put in. When should I seek immediate medical care? When you have an implanted port, you should seek immediate medical care if:  You notice a bad smell coming from the incision site.  You have swelling, redness, or drainage at the incision site.  You have more swelling or pain at the port site or the surrounding area.  You have a fever that is not controlled with medicine.  This information is not intended to replace advice given to you by your health care provider. Make sure you discuss any questions you have with your health care provider. Document  Released: 10/28/2005 Document Revised: 04/04/2016 Document Reviewed: 07/05/2013 Elsevier Interactive Patient Education  2017 [employer]  

## 2017-09-11 NOTE — Progress Notes (Signed)
[facility] OFFICE PROGRESS NOTE  Patient Care Team: Cari Caraway, MD as PCP - General (Family Medicine) Irine Seal, MD as Attending Physician (Urology) Jacolyn Reedy, MD as Attending Physician (Cardiology) Heath Lark, MD as Consulting Physician (Hematology and Oncology)  SUMMARY OF ONCOLOGIC HISTORY: Oncology History   Del 13q     CLL (chronic lymphocytic leukemia) ([place])   02/09/2013 Pathology Results    Peripheral Blood Flow Cytometry - FINDINGS CONSISTENT WITH CHRONIC LYMPHOCYTIC LEUKEMIA      09/02/2013 Pathology Results    FISH positive for deletion 13 q      05/22/2017 Imaging    Worsening mesenteric and retroperitoneal adenopathy concerning for worsening lymphoproliferative disorder/lymphoma.  Small cysts in the liver and kidneys are stable.  Prior cholecystectomy.  Aortic atherosclerosis.  Left colonic diverticulosis without active diverticulitis.      08/11/2017 Imaging    Chest Impression:  1. Mild mediastinal and hilar lymphadenopathy. 2. Clusters small axial lymph nodes.  Abdomen / Pelvis Impression:  1. Mild periaortic retroperitoneal adenopathy and moderate central mesenteric adenopathy not changed comparison CT. 2. Mild iliac adenopathy 3. Normal volume spleen. 4. No explanation for RIGHT lower quadrant pain. Cluster of lymph nodes in the ileocecal mesenteries similar to prior.      08/12/2017 Procedure    Placement of single lumen port a cath via right internal jugular vein. The catheter tip lies at the cavo-atrial junction. A power injectable port a cath was placed and is ready for immediate use.      08/14/2017 -  Chemotherapy    She received Bendamustine and Rituxan       INTERVAL HISTORY: Please see below for problem oriented charting. She returns for further follow-up She feels well No recent nausea vomiting Her abdominal distention has improved Denies changes in bowel habits She denies further flare of her  skin rashes  REVIEW OF SYSTEMS:   Constitutional: Denies fevers, chills or abnormal weight loss Eyes: Denies blurriness of vision Ears, nose, mouth, throat, and face: Denies mucositis or sore throat Respiratory: Denies cough, dyspnea or wheezes Cardiovascular: Denies palpitation, chest discomfort or lower extremity swelling Gastrointestinal:  Denies nausea, heartburn or change in bowel habits Lymphatics: Denies new lymphadenopathy or easy bruising Neurological:Denies numbness, tingling or new weaknesses Behavioral/Psych: Mood is stable, no new changes  All other systems were reviewed with the patient and are negative.  I have reviewed the past medical history, past surgical history, social history and family history with the patient and they are unchanged from previous note.  ALLERGIES:  is allergic to codeine; demerol [meperidine]; morphine and related; ciprofloxacin; and sulfa antibiotics.  MEDICATIONS:  Current Outpatient Prescriptions  Medication Sig Dispense Refill  . acetaminophen (TYLENOL) 500 MG tablet Take 500 mg by mouth every 6 (six) hours as needed for pain.    Marland Kitchen acyclovir (ZOVIRAX) 400 MG tablet Take 1 tablet (400 mg total) by mouth daily. 30 tablet 3  . allopurinol (ZYLOPRIM) 300 MG tablet Take 1 tablet (300 mg total) by mouth daily. 30 tablet 3  . aspirin 81 MG tablet Take 81 mg by mouth daily.    Marland Kitchen atorvastatin (LIPITOR) 40 MG tablet Take 40 mg by mouth daily.    . Calcium Carb-Cholecalciferol (CALCIUM + D3) 600-200 MG-UNIT TABS Take 1 tablet by mouth 3 (three) times a week.     Marland Kitchen DIGOX 0.125 MG tablet Take 0.125 mg by mouth daily.    . fluticasone (FLONASE) 50 MCG/ACT nasal spray Place 2 sprays into  the nose daily.    . folic acid (FOLVITE) 1 MG tablet Take 1 mg by mouth daily.    . hydrochlorothiazide (HYDRODIURIL) 12.5 MG tablet Take 12.5 mg by mouth daily.    Marland Kitchen ketotifen (ZADITOR) 0.025 % ophthalmic solution Apply 1 drop to eye 2 (two) times daily.    Marland Kitchen  lidocaine-prilocaine (EMLA) cream Apply to affected area once 30 g 3  . metoprolol (LOPRESSOR) 50 MG tablet Take 50 mg by mouth 2 (two) times daily.    . Multiple Vitamin (MULTIVITAMIN) tablet Take 1 tablet by mouth daily.    . Multiple Vitamins-Minerals (EYE VITAMINS & MINERALS) TABS Take by mouth.    . ondansetron (ZOFRAN) 8 MG tablet Take 1 tablet (8 mg total) by mouth every 8 (eight) hours as needed for refractory nausea / vomiting. 30 tablet 1  . potassium chloride SA (K-DUR,KLOR-CON) 20 MEQ tablet Take 20 mEq by mouth 3 (three) times a week. Mon, Wed, Fri    . prochlorperazine (COMPAZINE) 10 MG tablet Take 1 tablet (10 mg total) by mouth every 6 (six) hours as needed (Nausea or vomiting). 30 tablet 1   No current facility-administered medications for this visit.    Facility-Administered Medications Ordered in Other Visits  Medication Dose Route Frequency Provider Last Rate Last Dose  . 0.9 %  sodium chloride infusion   Intravenous Once Alvy Bimler, Bronwyn Belasco, MD      . acetaminophen (TYLENOL) tablet 650 mg  650 mg Oral Once Alvy Bimler, Lakayla Barrington, MD      . bendamustine (BENDEKA) 150 mg in sodium chloride 0.9 % 50 mL (2.6786 mg/mL) chemo infusion  90 mg/m2 (Treatment Plan Recorded) Intravenous Once Alvy Bimler, Lachandra Dettmann, MD      . dexamethasone (DECADRON) injection 10 mg  10 mg Intravenous Once Alvy Bimler, Morrigan Wickens, MD      . diphenhydrAMINE (BENADRYL) capsule 50 mg  50 mg Oral Once Alvy Bimler, Taraoluwa Thakur, MD      . heparin lock flush 100 unit/mL  500 Units Intracatheter Once PRN Alvy Bimler, Taelar Gronewold, MD      . palonosetron (ALOXI) injection 0.25 mg  0.25 mg Intravenous Once Malachai Schalk, MD      . riTUXimab (RITUXAN) 600 mg in sodium chloride 0.9 % 190 mL chemo infusion  375 mg/m2 (Treatment Plan Recorded) Intravenous Once Dakhari Zuver, MD      . sodium chloride flush (NS) 0.9 % injection 10 mL  10 mL Intracatheter PRN Alvy Bimler, Toshua Honsinger, MD        PHYSICAL EXAMINATION: ECOG PERFORMANCE STATUS: 0 - Asymptomatic  Vitals:   09/11/17 0840  BP: 127/63   Pulse: 64  Resp: 18  Temp: 98.7 F (37.1 C)  SpO2: 99%   Filed Weights   09/11/17 0840  Weight: 139 lb 12.8 oz (63.4 kg)    GENERAL:alert, no distress and comfortable SKIN: She has mild skin rash from seborrheic keratosis, mildly improved compared to previous visit EYES: normal, Conjunctiva are pink and non-injected, sclera clear OROPHARYNX:no exudate, no erythema and lips, buccal mucosa, and tongue normal  NECK: supple, thyroid normal size, non-tender, without nodularity LYMPH:  no palpable lymphadenopathy in the cervical, axillary or inguinal LUNGS: clear to auscultation and percussion with normal breathing effort HEART: regular rate & rhythm and no murmurs and no lower extremity edema ABDOMEN:abdomen soft, non-tender and normal bowel sounds Musculoskeletal:no cyanosis of digits and no clubbing  NEURO: alert & oriented x 3 with fluent speech, no focal motor/sensory deficits  LABORATORY DATA:  I have reviewed the data as listed  Component Value Date/Time   NA 139 09/11/2017 0805   K 3.5 09/11/2017 0805   CL 104 08/12/2017 0955   CL 104 02/08/2013 1454   CO2 27 09/11/2017 0805   GLUCOSE 101 09/11/2017 0805   GLUCOSE 103 (H) 02/08/2013 1454   BUN 23.4 09/11/2017 0805   CREATININE 0.8 09/11/2017 0805   CALCIUM 9.2 09/11/2017 0805   PROT 6.0 (L) 09/11/2017 0805   ALBUMIN 3.6 09/11/2017 0805   AST 15 09/11/2017 0805   ALT 23 09/11/2017 0805   ALKPHOS 65 09/11/2017 0805   BILITOT 0.83 09/11/2017 0805   GFRNONAA >60 08/12/2017 0955   GFRAA >60 08/12/2017 0955    No results found for: SPEP, UPEP  Lab Results  Component Value Date   WBC 7.0 09/11/2017   NEUTROABS 4.8 09/11/2017   HGB 11.1 (L) 09/11/2017   HCT 33.1 (L) 09/11/2017   MCV 84.7 09/11/2017   PLT 148 09/11/2017      Chemistry      Component Value Date/Time   NA 139 09/11/2017 0805   K 3.5 09/11/2017 0805   CL 104 08/12/2017 0955   CL 104 02/08/2013 1454   CO2 27 09/11/2017 0805   BUN 23.4  09/11/2017 0805   CREATININE 0.8 09/11/2017 0805      Component Value Date/Time   CALCIUM 9.2 09/11/2017 0805   ALKPHOS 65 09/11/2017 0805   AST 15 09/11/2017 0805   ALT 23 09/11/2017 0805   BILITOT 0.83 09/11/2017 0805       RADIOGRAPHIC STUDIES: I have personally reviewed the radiological images as listed and agreed with the findings in the report. Ir US Guide Vasc Access Right  Result Date: 08/12/2017 CLINICAL DATA:  Progressive chronic lymphocytic leukemia. Port-A-Cath required for chemotherapy. EXAM: IMPLANTED PORT A CATH PLACEMENT WITH ULTRASOUND AND FLUOROSCOPIC GUIDANCE ANESTHESIA/SEDATION: 4.0 mg IV Versed; 100 mcg IV Fentanyl Total Moderate Sedation Time:  33 minutes The patient's level of consciousness and physiologic status were continuously monitored during the procedure by Radiology nursing. Additional Medications: 2 g IV Ancef. FLUOROSCOPY TIME:  24 seconds.  2.1 mGy. PROCEDURE: The procedure, risks, benefits, and alternatives were explained to the patient. Questions regarding the procedure were encouraged and answered. The patient understands and consents to the procedure. A time-out was performed prior to initiating the procedure. Ultrasound was utilized to confirm patency of the right internal jugular vein. The right neck and chest were prepped with chlorhexidine in a sterile fashion, and a sterile drape was applied covering the operative field. Maximum barrier sterile technique with sterile gowns and gloves were used for the procedure. Local anesthesia was provided with 1% lidocaine. After creating a small venotomy incision, a 21 gauge needle was advanced into the right internal jugular vein under direct, real-time ultrasound guidance. Ultrasound image documentation was performed. After securing guidewire access, an 8 Fr dilator was placed. A J-wire was kinked to measure appropriate catheter length. A subcutaneous port pocket was then created along the upper chest wall utilizing  sharp and blunt dissection. Portable cautery was utilized. The pocket was irrigated with sterile saline. A single lumen power injectable port was chosen for placement. The 8 Fr catheter was tunneled from the port pocket site to the venotomy incision. The port was placed in the pocket. External catheter was trimmed to appropriate length based on guidewire measurement. At the venotomy, an 8 Fr peel-away sheath was placed over a guidewire. The catheter was then placed through the sheath and the sheath removed. Final catheter positioning  was confirmed and documented with a fluoroscopic spot image. The port was accessed with a needle and aspirated and flushed with heparinized saline. The access needle was removed. The venotomy and port pocket incisions were closed with subcutaneous 3-0 Monocryl and subcuticular 4-0 Vicryl. Dermabond was applied to both incisions. COMPLICATIONS: COMPLICATIONS None FINDINGS: After catheter placement, the tip lies at the cavo-atrial junction. The catheter aspirates normally and is ready for immediate use. IMPRESSION: Placement of single lumen port a cath via right internal jugular vein. The catheter tip lies at the cavo-atrial junction. A power injectable port a cath was placed and is ready for immediate use. Electronically Signed   By: Aletta Edouard M.D.   On: 08/12/2017 15:20   Ir Fluoro Guide Port Insertion Right  Result Date: 08/12/2017 CLINICAL DATA:  Progressive chronic lymphocytic leukemia. Port-A-Cath required for chemotherapy. EXAM: IMPLANTED PORT A CATH PLACEMENT WITH ULTRASOUND AND FLUOROSCOPIC GUIDANCE ANESTHESIA/SEDATION: 4.0 mg IV Versed; 100 mcg IV Fentanyl Total Moderate Sedation Time:  33 minutes The patient's level of consciousness and physiologic status were continuously monitored during the procedure by Radiology nursing. Additional Medications: 2 g IV Ancef. FLUOROSCOPY TIME:  24 seconds.  2.1 mGy. PROCEDURE: The procedure, risks, benefits, and alternatives were  explained to the patient. Questions regarding the procedure were encouraged and answered. The patient understands and consents to the procedure. A time-out was performed prior to initiating the procedure. Ultrasound was utilized to confirm patency of the right internal jugular vein. The right neck and chest were prepped with chlorhexidine in a sterile fashion, and a sterile drape was applied covering the operative field. Maximum barrier sterile technique with sterile gowns and gloves were used for the procedure. Local anesthesia was provided with 1% lidocaine. After creating a small venotomy incision, a 21 gauge needle was advanced into the right internal jugular vein under direct, real-time ultrasound guidance. Ultrasound image documentation was performed. After securing guidewire access, an 8 Fr dilator was placed. A J-wire was kinked to measure appropriate catheter length. A subcutaneous port pocket was then created along the upper chest wall utilizing sharp and blunt dissection. Portable cautery was utilized. The pocket was irrigated with sterile saline. A single lumen power injectable port was chosen for placement. The 8 Fr catheter was tunneled from the port pocket site to the venotomy incision. The port was placed in the pocket. External catheter was trimmed to appropriate length based on guidewire measurement. At the venotomy, an 8 Fr peel-away sheath was placed over a guidewire. The catheter was then placed through the sheath and the sheath removed. Final catheter positioning was confirmed and documented with a fluoroscopic spot image. The port was accessed with a needle and aspirated and flushed with heparinized saline. The access needle was removed. The venotomy and port pocket incisions were closed with subcutaneous 3-0 Monocryl and subcuticular 4-0 Vicryl. Dermabond was applied to both incisions. COMPLICATIONS: COMPLICATIONS None FINDINGS: After catheter placement, the tip lies at the cavo-atrial  junction. The catheter aspirates normally and is ready for immediate use. IMPRESSION: Placement of single lumen port a cath via right internal jugular vein. The catheter tip lies at the cavo-atrial junction. A power injectable port a cath was placed and is ready for immediate use. Electronically Signed   By: Aletta Edouard M.D.   On: 08/12/2017 15:20    ASSESSMENT & PLAN:  CLL (chronic lymphocytic leukemia) The patient has complete hematology good response with only 1 cycle of treatment I have told her to discontinue dexamethasone She  will finish her current prescription of allopurinol Plan would be to complete 6 cycles of chemotherapy before repeat imaging study  Skin rash The skin rash is not due to infection or recent treatment It is not uncommon to see a flare of seborrheic keratosis after recent chemotherapy I reassured the patient this is not a drug reaction Recommend conservative management only  Anemia due to antineoplastic chemotherapy This is likely due to recent treatment. The patient denies recent history of bleeding such as epistaxis, hematuria or hematochezia. She is asymptomatic from the anemia. I will observe for now.     No orders of the defined types were placed in this encounter.  All questions were answered. The patient knows to call the clinic with any problems, questions or concerns. No barriers to learning was detected. I spent 15 minutes counseling the patient face to face. The total time spent in the appointment was 20 minutes and more than 50% was on counseling and review of test results     Heath Lark, MD 09/11/2017 9:17 AM

## 2017-09-12 ENCOUNTER — Ambulatory Visit (HOSPITAL_BASED_OUTPATIENT_CLINIC_OR_DEPARTMENT_OTHER): Payer: Medicare Other

## 2017-09-12 VITALS — BP 135/62 | HR 59 | Temp 98.8°F | Resp 18

## 2017-09-12 DIAGNOSIS — C9110 Chronic lymphocytic leukemia of B-cell type not having achieved remission: Secondary | ICD-10-CM

## 2017-09-12 DIAGNOSIS — Z5111 Encounter for antineoplastic chemotherapy: Secondary | ICD-10-CM

## 2017-09-12 MED ORDER — SODIUM CHLORIDE 0.9% FLUSH
10.0000 mL | INTRAVENOUS | Status: DC | PRN
  Administered 2017-09-12: 10 mL
  Filled 2017-09-12: qty 10

## 2017-09-12 MED ORDER — DEXAMETHASONE SODIUM PHOSPHATE 10 MG/ML IJ SOLN
INTRAMUSCULAR | Status: AC
  Filled 2017-09-12: qty 1

## 2017-09-12 MED ORDER — SODIUM CHLORIDE 0.9 % IV SOLN
Freq: Once | INTRAVENOUS | Status: AC
  Administered 2017-09-12: 08:00:00 via INTRAVENOUS

## 2017-09-12 MED ORDER — SODIUM CHLORIDE 0.9 % IV SOLN
90.0000 mg/m2 | Freq: Once | INTRAVENOUS | Status: AC
  Administered 2017-09-12: 150 mg via INTRAVENOUS
  Filled 2017-09-12: qty 6

## 2017-09-12 MED ORDER — DEXAMETHASONE SODIUM PHOSPHATE 10 MG/ML IJ SOLN
10.0000 mg | Freq: Once | INTRAMUSCULAR | Status: AC
  Administered 2017-09-12: 10 mg via INTRAVENOUS

## 2017-09-12 MED ORDER — HEPARIN SOD (PORK) LOCK FLUSH 100 UNIT/ML IV SOLN
500.0000 [IU] | Freq: Once | INTRAVENOUS | Status: AC | PRN
  Administered 2017-09-12: 500 [IU]
  Filled 2017-09-12: qty 5

## 2017-09-12 NOTE — Patient Instructions (Signed)
[facility] Discharge Instructions for Patients Receiving Chemotherapy  Today you received the following chemotherapy agents bendamustine (Bendeka)  To help prevent nausea and vomiting after your treatment, we encourage you to take your nausea medication as directed.    If you develop nausea and vomiting that is not controlled by your nausea medication, call the clinic.   BELOW ARE SYMPTOMS THAT SHOULD BE REPORTED IMMEDIATELY:  *FEVER GREATER THAN 100.5 F  *CHILLS WITH OR WITHOUT FEVER  NAUSEA AND VOMITING THAT IS NOT CONTROLLED WITH YOUR NAUSEA MEDICATION  *UNUSUAL SHORTNESS OF BREATH  *UNUSUAL BRUISING OR BLEEDING  TENDERNESS IN MOUTH AND THROAT WITH OR WITHOUT PRESENCE OF ULCERS  *URINARY PROBLEMS  *BOWEL PROBLEMS  UNUSUAL RASH Items with * indicate a potential emergency and should be followed up as soon as possible.  Feel free to call the clinic should you have any questions or concerns. The clinic phone number is [phone].  Please show the CHEMO ALERT CARD at check-in to the Emergency Department and triage nurse.   

## 2017-09-25 DIAGNOSIS — Z79899 Other long term (current) drug therapy: Secondary | ICD-10-CM | POA: Diagnosis not present

## 2017-09-25 DIAGNOSIS — C9110 Chronic lymphocytic leukemia of B-cell type not having achieved remission: Secondary | ICD-10-CM | POA: Diagnosis not present

## 2017-09-25 DIAGNOSIS — Z1389 Encounter for screening for other disorder: Secondary | ICD-10-CM | POA: Diagnosis not present

## 2017-09-25 DIAGNOSIS — J309 Allergic rhinitis, unspecified: Secondary | ICD-10-CM | POA: Diagnosis not present

## 2017-09-25 DIAGNOSIS — M85852 Other specified disorders of bone density and structure, left thigh: Secondary | ICD-10-CM | POA: Diagnosis not present

## 2017-09-25 DIAGNOSIS — I480 Paroxysmal atrial fibrillation: Secondary | ICD-10-CM | POA: Diagnosis not present

## 2017-09-25 DIAGNOSIS — I10 Essential (primary) hypertension: Secondary | ICD-10-CM | POA: Diagnosis not present

## 2017-09-25 DIAGNOSIS — E782 Mixed hyperlipidemia: Secondary | ICD-10-CM | POA: Diagnosis not present

## 2017-09-25 DIAGNOSIS — N390 Urinary tract infection, site not specified: Secondary | ICD-10-CM | POA: Diagnosis not present

## 2017-09-25 DIAGNOSIS — Z0000 Encounter for general adult medical examination without abnormal findings: Secondary | ICD-10-CM | POA: Diagnosis not present

## 2017-09-25 DIAGNOSIS — Z800 Family history of malignant neoplasm of digestive organs: Secondary | ICD-10-CM | POA: Diagnosis not present

## 2017-10-09 ENCOUNTER — Ambulatory Visit (HOSPITAL_BASED_OUTPATIENT_CLINIC_OR_DEPARTMENT_OTHER): Payer: Medicare Other

## 2017-10-09 ENCOUNTER — Other Ambulatory Visit (HOSPITAL_BASED_OUTPATIENT_CLINIC_OR_DEPARTMENT_OTHER): Payer: Medicare Other

## 2017-10-09 ENCOUNTER — Ambulatory Visit (HOSPITAL_BASED_OUTPATIENT_CLINIC_OR_DEPARTMENT_OTHER): Payer: Medicare Other | Admitting: Hematology and Oncology

## 2017-10-09 ENCOUNTER — Ambulatory Visit: Payer: Medicare Other

## 2017-10-09 ENCOUNTER — Telehealth: Payer: Self-pay | Admitting: Hematology and Oncology

## 2017-10-09 VITALS — BP 126/52 | HR 97 | Temp 100.1°F | Resp 18 | Ht 62.0 in | Wt 142.2 lb

## 2017-10-09 VITALS — BP 119/31 | HR 72 | Temp 98.1°F | Resp 17

## 2017-10-09 DIAGNOSIS — R110 Nausea: Secondary | ICD-10-CM

## 2017-10-09 DIAGNOSIS — Z95828 Presence of other vascular implants and grafts: Secondary | ICD-10-CM

## 2017-10-09 DIAGNOSIS — E441 Mild protein-calorie malnutrition: Secondary | ICD-10-CM | POA: Diagnosis not present

## 2017-10-09 DIAGNOSIS — C9110 Chronic lymphocytic leukemia of B-cell type not having achieved remission: Secondary | ICD-10-CM

## 2017-10-09 DIAGNOSIS — Z5112 Encounter for antineoplastic immunotherapy: Secondary | ICD-10-CM | POA: Diagnosis not present

## 2017-10-09 DIAGNOSIS — D6481 Anemia due to antineoplastic chemotherapy: Secondary | ICD-10-CM

## 2017-10-09 DIAGNOSIS — T451X5A Adverse effect of antineoplastic and immunosuppressive drugs, initial encounter: Secondary | ICD-10-CM

## 2017-10-09 DIAGNOSIS — Z5111 Encounter for antineoplastic chemotherapy: Secondary | ICD-10-CM

## 2017-10-09 LAB — COMPREHENSIVE METABOLIC PANEL
ALT: 34 U/L (ref 0–55)
AST: 26 U/L (ref 5–34)
Albumin: 2.9 g/dL — ABNORMAL LOW (ref 3.5–5.0)
Alkaline Phosphatase: 69 U/L (ref 40–150)
Anion Gap: 9 mEq/L (ref 3–11)
BILIRUBIN TOTAL: 0.56 mg/dL (ref 0.20–1.20)
BUN: 14 mg/dL (ref 7.0–26.0)
CO2: 28 meq/L (ref 22–29)
CREATININE: 0.9 mg/dL (ref 0.6–1.1)
Calcium: 9.5 mg/dL (ref 8.4–10.4)
Chloride: 100 mEq/L (ref 98–109)
GLUCOSE: 138 mg/dL (ref 70–140)
Potassium: 3.9 mEq/L (ref 3.5–5.1)
SODIUM: 136 meq/L (ref 136–145)
TOTAL PROTEIN: 5.9 g/dL — AB (ref 6.4–8.3)

## 2017-10-09 LAB — CBC WITH DIFFERENTIAL/PLATELET
BASO%: 0.7 % (ref 0.0–2.0)
Basophils Absolute: 0 10*3/uL (ref 0.0–0.1)
EOS%: 3.6 % (ref 0.0–7.0)
Eosinophils Absolute: 0.2 10*3/uL (ref 0.0–0.5)
HCT: 25.1 % — ABNORMAL LOW (ref 34.8–46.6)
HGB: 8.5 g/dL — ABNORMAL LOW (ref 11.6–15.9)
LYMPH%: 7.5 % — AB (ref 14.0–49.7)
MCH: 28.7 pg (ref 25.1–34.0)
MCHC: 34 g/dL (ref 31.5–36.0)
MCV: 84.3 fL (ref 79.5–101.0)
MONO#: 0.7 10*3/uL (ref 0.1–0.9)
MONO%: 12.7 % (ref 0.0–14.0)
NEUT%: 75.5 % (ref 38.4–76.8)
NEUTROS ABS: 4 10*3/uL (ref 1.5–6.5)
Platelets: 170 10*3/uL (ref 145–400)
RBC: 2.98 10*6/uL — AB (ref 3.70–5.45)
RDW: 17.7 % — ABNORMAL HIGH (ref 11.2–14.5)
WBC: 5.3 10*3/uL (ref 3.9–10.3)
lymph#: 0.4 10*3/uL — ABNORMAL LOW (ref 0.9–3.3)

## 2017-10-09 MED ORDER — HEPARIN SOD (PORK) LOCK FLUSH 100 UNIT/ML IV SOLN
500.0000 [IU] | Freq: Once | INTRAVENOUS | Status: AC | PRN
  Administered 2017-10-09: 500 [IU]
  Filled 2017-10-09: qty 5

## 2017-10-09 MED ORDER — DEXAMETHASONE SODIUM PHOSPHATE 10 MG/ML IJ SOLN
INTRAMUSCULAR | Status: AC
  Filled 2017-10-09: qty 1

## 2017-10-09 MED ORDER — SODIUM CHLORIDE 0.9 % IV SOLN
45.0000 mg/m2 | Freq: Once | INTRAVENOUS | Status: AC
  Administered 2017-10-09: 75 mg via INTRAVENOUS
  Filled 2017-10-09: qty 3

## 2017-10-09 MED ORDER — PALONOSETRON HCL INJECTION 0.25 MG/5ML
INTRAVENOUS | Status: AC
  Filled 2017-10-09: qty 5

## 2017-10-09 MED ORDER — SODIUM CHLORIDE 0.9 % IV SOLN
375.0000 mg/m2 | Freq: Once | INTRAVENOUS | Status: AC
  Administered 2017-10-09: 600 mg via INTRAVENOUS
  Filled 2017-10-09: qty 10

## 2017-10-09 MED ORDER — SODIUM CHLORIDE 0.9% FLUSH
10.0000 mL | INTRAVENOUS | Status: DC | PRN
  Administered 2017-10-09: 10 mL via INTRAVENOUS
  Filled 2017-10-09: qty 10

## 2017-10-09 MED ORDER — ACETAMINOPHEN 325 MG PO TABS
650.0000 mg | ORAL_TABLET | Freq: Once | ORAL | Status: AC
  Administered 2017-10-09: 650 mg via ORAL

## 2017-10-09 MED ORDER — DIPHENHYDRAMINE HCL 25 MG PO CAPS
ORAL_CAPSULE | ORAL | Status: AC
  Filled 2017-10-09: qty 2

## 2017-10-09 MED ORDER — SODIUM CHLORIDE 0.9% FLUSH
10.0000 mL | INTRAVENOUS | Status: DC | PRN
  Administered 2017-10-09: 10 mL
  Filled 2017-10-09: qty 10

## 2017-10-09 MED ORDER — DIPHENHYDRAMINE HCL 25 MG PO CAPS
50.0000 mg | ORAL_CAPSULE | Freq: Once | ORAL | Status: AC
  Administered 2017-10-09: 50 mg via ORAL

## 2017-10-09 MED ORDER — PALONOSETRON HCL INJECTION 0.25 MG/5ML
0.2500 mg | Freq: Once | INTRAVENOUS | Status: AC
  Administered 2017-10-09: 0.25 mg via INTRAVENOUS

## 2017-10-09 MED ORDER — SODIUM CHLORIDE 0.9 % IV SOLN
Freq: Once | INTRAVENOUS | Status: AC
  Administered 2017-10-09: 10:00:00 via INTRAVENOUS

## 2017-10-09 MED ORDER — PREDNISONE 5 MG PO TABS: 5.0000 mg | ORAL_TABLET | Freq: Every day | ORAL | 0 refills | Status: DC

## 2017-10-09 MED ORDER — DEXAMETHASONE SODIUM PHOSPHATE 10 MG/ML IJ SOLN
10.0000 mg | Freq: Once | INTRAMUSCULAR | Status: AC
  Administered 2017-10-09: 10 mg via INTRAVENOUS

## 2017-10-09 MED ORDER — ACETAMINOPHEN 325 MG PO TABS
ORAL_TABLET | ORAL | Status: AC
  Filled 2017-10-09: qty 2

## 2017-10-09 NOTE — Patient Instructions (Signed)
[facility] Discharge Instructions for Patients Receiving Chemotherapy  Today you received the following chemotherapy agents:  Rituxan and Bendeka.  To help prevent nausea and vomiting after your treatment, we encourage you to take your nausea medication as directed.   If you develop nausea and vomiting that is not controlled by your nausea medication, call the clinic.   BELOW ARE SYMPTOMS THAT SHOULD BE REPORTED IMMEDIATELY:  *FEVER GREATER THAN 100.5 F  *CHILLS WITH OR WITHOUT FEVER  NAUSEA AND VOMITING THAT IS NOT CONTROLLED WITH YOUR NAUSEA MEDICATION  *UNUSUAL SHORTNESS OF BREATH  *UNUSUAL BRUISING OR BLEEDING  TENDERNESS IN MOUTH AND THROAT WITH OR WITHOUT PRESENCE OF ULCERS  *URINARY PROBLEMS  *BOWEL PROBLEMS  UNUSUAL RASH Items with * indicate a potential emergency and should be followed up as soon as possible.  Feel free to call the clinic should you have any questions or concerns. The clinic phone number is [phone].  Please show the CHEMO ALERT CARD at check-in to the Emergency Department and triage nurse.   

## 2017-10-09 NOTE — Telephone Encounter (Signed)
Scheduled appt per 11/29 los - Gave patient AVS and calender per los.  

## 2017-10-10 ENCOUNTER — Encounter: Payer: Self-pay | Admitting: Hematology and Oncology

## 2017-10-10 ENCOUNTER — Ambulatory Visit (HOSPITAL_BASED_OUTPATIENT_CLINIC_OR_DEPARTMENT_OTHER): Payer: Medicare Other

## 2017-10-10 VITALS — BP 128/61 | HR 64 | Temp 97.4°F | Resp 17

## 2017-10-10 DIAGNOSIS — C9110 Chronic lymphocytic leukemia of B-cell type not having achieved remission: Secondary | ICD-10-CM | POA: Diagnosis not present

## 2017-10-10 DIAGNOSIS — Z5111 Encounter for antineoplastic chemotherapy: Secondary | ICD-10-CM | POA: Diagnosis not present

## 2017-10-10 DIAGNOSIS — E441 Mild protein-calorie malnutrition: Secondary | ICD-10-CM | POA: Insufficient documentation

## 2017-10-10 DIAGNOSIS — R110 Nausea: Secondary | ICD-10-CM | POA: Insufficient documentation

## 2017-10-10 MED ORDER — SODIUM CHLORIDE 0.9 % IV SOLN
45.0000 mg/m2 | Freq: Once | INTRAVENOUS | Status: AC
  Administered 2017-10-10: 75 mg via INTRAVENOUS
  Filled 2017-10-10: qty 3

## 2017-10-10 MED ORDER — DEXAMETHASONE SODIUM PHOSPHATE 10 MG/ML IJ SOLN
10.0000 mg | Freq: Once | INTRAMUSCULAR | Status: AC
  Administered 2017-10-10: 10 mg via INTRAVENOUS

## 2017-10-10 MED ORDER — DEXAMETHASONE SODIUM PHOSPHATE 10 MG/ML IJ SOLN
INTRAMUSCULAR | Status: AC
  Filled 2017-10-10: qty 1

## 2017-10-10 MED ORDER — HEPARIN SOD (PORK) LOCK FLUSH 100 UNIT/ML IV SOLN
500.0000 [IU] | Freq: Once | INTRAVENOUS | Status: AC | PRN
  Administered 2017-10-10: 500 [IU]
  Filled 2017-10-10: qty 5

## 2017-10-10 MED ORDER — SODIUM CHLORIDE 0.9% FLUSH
10.0000 mL | INTRAVENOUS | Status: DC | PRN
  Administered 2017-10-10: 10 mL
  Filled 2017-10-10: qty 10

## 2017-10-10 MED ORDER — SODIUM CHLORIDE 0.9 % IV SOLN
Freq: Once | INTRAVENOUS | Status: AC
  Administered 2017-10-10: 09:00:00 via INTRAVENOUS

## 2017-10-10 NOTE — Patient Instructions (Signed)
[name] Discharge Instructions for Patients Receiving Chemotherapy  Today you received the following chemotherapy agents Bendeka.  To help prevent nausea and vomiting after your treatment, we encourage you to take your nausea medication as directed.   If you develop nausea and vomiting that is not controlled by your nausea medication, call the clinic.   BELOW ARE SYMPTOMS THAT SHOULD BE REPORTED IMMEDIATELY:  *FEVER GREATER THAN 100.5 F  *CHILLS WITH OR WITHOUT FEVER  NAUSEA AND VOMITING THAT IS NOT CONTROLLED WITH YOUR NAUSEA MEDICATION  *UNUSUAL SHORTNESS OF BREATH  *UNUSUAL BRUISING OR BLEEDING  TENDERNESS IN MOUTH AND THROAT WITH OR WITHOUT PRESENCE OF ULCERS  *URINARY PROBLEMS  *BOWEL PROBLEMS  UNUSUAL RASH Items with * indicate a potential emergency and should be followed up as soon as possible.  Feel free to call the clinic should you have any questions or concerns. The clinic phone number is [phone].  Please show the CHEMO ALERT CARD at check-in to the Emergency Department and triage nurse.   

## 2017-10-10 NOTE — Assessment & Plan Note (Signed)
This is due to side effects of chemotherapy She is symptomatic but I do not believe she needs blood transfusion I plan to reduce a dose of chemotherapy by 50%

## 2017-10-10 NOTE — Assessment & Plan Note (Signed)
She had recent nausea without vomiting This is due to chemotherapy Recommend dose adjustment as above and for her to take antiemetics

## 2017-10-10 NOTE — Progress Notes (Signed)
[place] OFFICE PROGRESS NOTE  Patient Care Team: Cari Caraway, MD as PCP - General (Family Medicine) Irine Seal, MD as Attending Physician (Urology) Jacolyn Reedy, MD as Attending Physician (Cardiology) Heath Lark, MD as Consulting Physician (Hematology and Oncology)  SUMMARY OF ONCOLOGIC HISTORY: Oncology History   Del 13q     CLL (chronic lymphocytic leukemia) ([place])   02/09/2013 Pathology Results    Peripheral Blood Flow Cytometry - FINDINGS CONSISTENT WITH CHRONIC LYMPHOCYTIC LEUKEMIA      09/02/2013 Pathology Results    FISH positive for deletion 13 q      05/22/2017 Imaging    Worsening mesenteric and retroperitoneal adenopathy concerning for worsening lymphoproliferative disorder/lymphoma.  Small cysts in the liver and kidneys are stable.  Prior cholecystectomy.  Aortic atherosclerosis.  Left colonic diverticulosis without active diverticulitis.      08/11/2017 Imaging    Chest Impression:  1. Mild mediastinal and hilar lymphadenopathy. 2. Clusters small axial lymph nodes.  Abdomen / Pelvis Impression:  1. Mild periaortic retroperitoneal adenopathy and moderate central mesenteric adenopathy not changed comparison CT. 2. Mild iliac adenopathy 3. Normal volume spleen. 4. No explanation for RIGHT lower quadrant pain. Cluster of lymph nodes in the ileocecal mesenteries similar to prior.      08/12/2017 Procedure    Placement of single lumen port a cath via right internal jugular vein. The catheter tip lies at the cavo-atrial junction. A power injectable port a cath was placed and is ready for immediate use.      08/14/2017 -  Chemotherapy    She received Bendamustine and Rituxan       INTERVAL HISTORY: Please see below for problem oriented charting. She is seen prior to cycle 3 of chemotherapy She complained of some nausea but no vomiting She complained of excessive fatigue after recent treatments No new lymphadenopathy No  recent infection The patient denies any recent signs or symptoms of bleeding such as spontaneous epistaxis, hematuria or hematochezia.  REVIEW OF SYSTEMS:   Constitutional: Denies fevers, chills or abnormal weight loss Eyes: Denies blurriness of vision Ears, nose, mouth, throat, and face: Denies mucositis or sore throat Respiratory: Denies cough, dyspnea or wheezes Cardiovascular: Denies palpitation, chest discomfort or lower extremity swelling Gastrointestinal:  Denies nausea, heartburn or change in bowel habits Skin: Denies abnormal skin rashes Lymphatics: Denies new lymphadenopathy or easy bruising Neurological:Denies numbness, tingling or new weaknesses Behavioral/Psych: Mood is stable, no new changes  All other systems were reviewed with the patient and are negative.  I have reviewed the past medical history, past surgical history, social history and family history with the patient and they are unchanged from previous note.  ALLERGIES:  is allergic to codeine; demerol [meperidine]; morphine and related; ciprofloxacin; and sulfa antibiotics.  MEDICATIONS:  Current Outpatient Medications  Medication Sig Dispense Refill  . acetaminophen (TYLENOL) 500 MG tablet Take 500 mg by mouth every 6 (six) hours as needed for pain.    Marland Kitchen acyclovir (ZOVIRAX) 400 MG tablet Take 1 tablet (400 mg total) by mouth daily. 30 tablet 3  . aspirin 81 MG tablet Take 81 mg by mouth daily.    Marland Kitchen atorvastatin (LIPITOR) 40 MG tablet Take 40 mg by mouth daily.    . Calcium Carb-Cholecalciferol (CALCIUM + D3) 600-200 MG-UNIT TABS Take 1 tablet by mouth 3 (three) times a week.     Marland Kitchen DIGOX 0.125 MG tablet Take 0.125 mg by mouth daily.    . fluticasone (FLONASE) 50 MCG/ACT nasal spray  Place 2 sprays into the nose daily.    . folic acid (FOLVITE) 1 MG tablet Take 1 mg by mouth daily.    . hydrochlorothiazide (HYDRODIURIL) 12.5 MG tablet Take 12.5 mg by mouth daily.    Marland Kitchen ketotifen (ZADITOR) 0.025 % ophthalmic solution  Apply 1 drop to eye 2 (two) times daily.    Marland Kitchen lidocaine-prilocaine (EMLA) cream Apply to affected area once 30 g 3  . metoprolol (LOPRESSOR) 50 MG tablet Take 50 mg by mouth 2 (two) times daily.    . Multiple Vitamin (MULTIVITAMIN) tablet Take 1 tablet by mouth daily.    . Multiple Vitamins-Minerals (EYE VITAMINS & MINERALS) TABS Take by mouth.    . ondansetron (ZOFRAN) 8 MG tablet Take 1 tablet (8 mg total) by mouth every 8 (eight) hours as needed for refractory nausea / vomiting. 30 tablet 1  . potassium chloride SA (K-DUR,KLOR-CON) 20 MEQ tablet Take 20 mEq by mouth 3 (three) times a week. Mon, Wed, Fri    . predniSONE (DELTASONE) 5 MG tablet Take 1 tablet (5 mg total) by mouth daily with breakfast. 30 tablet 0  . prochlorperazine (COMPAZINE) 10 MG tablet Take 1 tablet (10 mg total) by mouth every 6 (six) hours as needed (Nausea or vomiting). 30 tablet 1   No current facility-administered medications for this visit.     PHYSICAL EXAMINATION: ECOG PERFORMANCE STATUS: 1 - Symptomatic but completely ambulatory  Vitals:   10/09/17 0818  BP: (!) 126/52  Pulse: 97  Resp: 18  Temp: 100.1 F (37.8 C)  SpO2: 95%   Filed Weights   10/09/17 0818  Weight: 142 lb 3.2 oz (64.5 kg)    GENERAL:alert, no distress and comfortable SKIN: skin color is pale, texture, turgor are normal, no rashes or significant lesions EYES: normal, Conjunctiva are pale and non-injected, sclera clear OROPHARYNX:no exudate, no erythema and lips, buccal mucosa, and tongue normal  NECK: supple, thyroid normal size, non-tender, without nodularity LYMPH:  no palpable lymphadenopathy in the cervical, axillary or inguinal LUNGS: clear to auscultation and percussion with normal breathing effort HEART: regular rate & rhythm and no murmurs and no lower extremity edema ABDOMEN:abdomen soft, non-tender and normal bowel sounds Musculoskeletal:no cyanosis of digits and no clubbing  NEURO: alert & oriented x 3 with fluent  speech, no focal motor/sensory deficits  LABORATORY DATA:  I have reviewed the data as listed    Component Value Date/Time   NA 136 10/09/2017 0735   K 3.9 10/09/2017 0735   CL 104 08/12/2017 0955   CL 104 02/08/2013 1454   CO2 28 10/09/2017 0735   GLUCOSE 138 10/09/2017 0735   GLUCOSE 103 (H) 02/08/2013 1454   BUN 14.0 10/09/2017 0735   CREATININE 0.9 10/09/2017 0735   CALCIUM 9.5 10/09/2017 0735   PROT 5.9 (L) 10/09/2017 0735   ALBUMIN 2.9 (L) 10/09/2017 0735   AST 26 10/09/2017 0735   ALT 34 10/09/2017 0735   ALKPHOS 69 10/09/2017 0735   BILITOT 0.56 10/09/2017 0735   GFRNONAA >60 08/12/2017 0955   GFRAA >60 08/12/2017 0955    No results found for: SPEP, UPEP  Lab Results  Component Value Date   WBC 5.3 10/09/2017   NEUTROABS 4.0 10/09/2017   HGB 8.5 (L) 10/09/2017   HCT 25.1 (L) 10/09/2017   MCV 84.3 10/09/2017   PLT 170 10/09/2017      Chemistry      Component Value Date/Time   NA 136 10/09/2017 0735   K 3.9 10/09/2017  0735   CL 104 08/12/2017 0955   CL 104 02/08/2013 1454   CO2 28 10/09/2017 0735   BUN 14.0 10/09/2017 0735   CREATININE 0.9 10/09/2017 0735      Component Value Date/Time   CALCIUM 9.5 10/09/2017 0735   ALKPHOS 69 10/09/2017 0735   AST 26 10/09/2017 0735   ALT 34 10/09/2017 0735   BILITOT 0.56 10/09/2017 0735      ASSESSMENT & PLAN:  CLL (chronic lymphocytic leukemia) She tolerated treatment poorly due to progressive pancytopenia Overall, she has responded well to treatment I recommend 50% dose reduction for chemotherapy I plan to repeat imaging study after cycle 3 to assess response to treatment  Anemia due to antineoplastic chemotherapy This is due to side effects of chemotherapy She is symptomatic but I do not believe she needs blood transfusion I plan to reduce a dose of chemotherapy by 50%   Nausea without vomiting She had recent nausea without vomiting This is due to chemotherapy Recommend dose adjustment as above and  for her to take antiemetics  Mild protein-calorie malnutrition ([place]) Secondary to recent nausea I recommend low-dose steroids for a few days after chemotherapy   Orders Placed This Encounter  Procedures  . CT ABDOMEN PELVIS W CONTRAST    Standing Status:   Future    Standing Expiration Date:   10/09/2018    Order Specific Question:   If indicated for the ordered procedure, I authorize the administration of contrast media per Radiology protocol    Answer:   Yes    Order Specific Question:   Preferred imaging location?    Answer:   [facility]    Order Specific Question:   Radiology Contrast Protocol - do NOT remove file path    Answer:   [email or web address]  . CT CHEST W CONTRAST    Standing Status:   Future    Standing Expiration Date:   10/09/2018    Order Specific Question:   If indicated for the ordered procedure, I authorize the administration of contrast media per Radiology protocol    Answer:   Yes    Order Specific Question:   Preferred imaging location?    Answer:   [facility]    Order Specific Question:   Radiology Contrast Protocol - do NOT remove file path    Answer:   [email or web address]   All questions were answered. The patient knows to call the clinic with any problems, questions or concerns. No barriers to learning was detected. I spent 20 minutes counseling the patient face to face. The total time spent in the appointment was 30 minutes and more than 50% was on counseling and review of test results     Heath Lark, MD 10/10/2017 4:57 PM

## 2017-10-10 NOTE — Assessment & Plan Note (Signed)
She tolerated treatment poorly due to progressive pancytopenia Overall, she has responded well to treatment I recommend 50% dose reduction for chemotherapy I plan to repeat imaging study after cycle 3 to assess response to treatment

## 2017-10-10 NOTE — Assessment & Plan Note (Signed)
Secondary to recent nausea I recommend low-dose steroids for a few days after chemotherapy

## 2017-11-11 HISTORY — PX: CATARACT EXTRACTION: SUR2

## 2017-11-12 ENCOUNTER — Ambulatory Visit (HOSPITAL_COMMUNITY)
Admission: RE | Admit: 2017-11-12 | Discharge: 2017-11-12 | Disposition: A | Discharge status: Home / Self Care | Payer: Medicare Other | Source: Ambulatory Visit | Attending: Hematology and Oncology | Admitting: Hematology and Oncology

## 2017-11-12 DIAGNOSIS — K7689 Other specified diseases of liver: Secondary | ICD-10-CM | POA: Insufficient documentation

## 2017-11-12 DIAGNOSIS — M419 Scoliosis, unspecified: Secondary | ICD-10-CM | POA: Diagnosis not present

## 2017-11-12 DIAGNOSIS — R918 Other nonspecific abnormal finding of lung field: Secondary | ICD-10-CM | POA: Diagnosis not present

## 2017-11-12 DIAGNOSIS — R590 Localized enlarged lymph nodes: Secondary | ICD-10-CM | POA: Insufficient documentation

## 2017-11-12 DIAGNOSIS — Z96641 Presence of right artificial hip joint: Secondary | ICD-10-CM | POA: Insufficient documentation

## 2017-11-12 DIAGNOSIS — C9110 Chronic lymphocytic leukemia of B-cell type not having achieved remission: Secondary | ICD-10-CM | POA: Diagnosis not present

## 2017-11-12 DIAGNOSIS — N281 Cyst of kidney, acquired: Secondary | ICD-10-CM | POA: Diagnosis not present

## 2017-11-12 DIAGNOSIS — Z90710 Acquired absence of both cervix and uterus: Secondary | ICD-10-CM | POA: Diagnosis not present

## 2017-11-12 DIAGNOSIS — I700 Atherosclerosis of aorta: Secondary | ICD-10-CM | POA: Diagnosis not present

## 2017-11-12 MED ORDER — IOPAMIDOL (ISOVUE-300) INJECTION 61%
INTRAVENOUS | Status: AC
  Filled 2017-11-12: qty 100

## 2017-11-12 MED ORDER — IOPAMIDOL (ISOVUE-300) INJECTION 61%
100.0000 mL | Freq: Once | INTRAVENOUS | Status: AC | PRN
  Administered 2017-11-12: 100 mL via INTRAVENOUS

## 2017-11-13 ENCOUNTER — Ambulatory Visit (HOSPITAL_BASED_OUTPATIENT_CLINIC_OR_DEPARTMENT_OTHER): Payer: Medicare Other

## 2017-11-13 ENCOUNTER — Telehealth: Payer: Self-pay | Admitting: Hematology and Oncology

## 2017-11-13 ENCOUNTER — Ambulatory Visit: Payer: Medicare Other

## 2017-11-13 ENCOUNTER — Encounter: Payer: Self-pay | Admitting: Hematology and Oncology

## 2017-11-13 ENCOUNTER — Ambulatory Visit (HOSPITAL_BASED_OUTPATIENT_CLINIC_OR_DEPARTMENT_OTHER): Payer: Medicare Other | Admitting: Hematology and Oncology

## 2017-11-13 VITALS — BP 107/63 | HR 68 | Temp 97.9°F | Resp 16

## 2017-11-13 DIAGNOSIS — T451X5A Adverse effect of antineoplastic and immunosuppressive drugs, initial encounter: Secondary | ICD-10-CM

## 2017-11-13 DIAGNOSIS — D6481 Anemia due to antineoplastic chemotherapy: Secondary | ICD-10-CM | POA: Diagnosis not present

## 2017-11-13 DIAGNOSIS — E441 Mild protein-calorie malnutrition: Secondary | ICD-10-CM

## 2017-11-13 DIAGNOSIS — C9110 Chronic lymphocytic leukemia of B-cell type not having achieved remission: Secondary | ICD-10-CM

## 2017-11-13 DIAGNOSIS — Z5111 Encounter for antineoplastic chemotherapy: Secondary | ICD-10-CM

## 2017-11-13 DIAGNOSIS — Z95828 Presence of other vascular implants and grafts: Secondary | ICD-10-CM

## 2017-11-13 DIAGNOSIS — K5909 Other constipation: Secondary | ICD-10-CM | POA: Diagnosis not present

## 2017-11-13 DIAGNOSIS — Z5112 Encounter for antineoplastic immunotherapy: Secondary | ICD-10-CM

## 2017-11-13 LAB — CBC WITH DIFFERENTIAL/PLATELET
BASO%: 1.4 % (ref 0.0–2.0)
BASOS ABS: 0.1 10*3/uL (ref 0.0–0.1)
EOS ABS: 0.2 10*3/uL (ref 0.0–0.5)
EOS%: 4.3 % (ref 0.0–7.0)
HCT: 30.6 % — ABNORMAL LOW (ref 34.8–46.6)
HGB: 10 g/dL — ABNORMAL LOW (ref 11.6–15.9)
LYMPH%: 7.2 % — AB (ref 14.0–49.7)
MCH: 26.4 pg (ref 25.1–34.0)
MCHC: 32.7 g/dL (ref 31.5–36.0)
MCV: 80.6 fL (ref 79.5–101.0)
MONO#: 0.6 10*3/uL (ref 0.1–0.9)
MONO%: 12 % (ref 0.0–14.0)
NEUT#: 3.5 10*3/uL (ref 1.5–6.5)
NEUT%: 75.1 % (ref 38.4–76.8)
Platelets: 170 10*3/uL (ref 145–400)
RBC: 3.79 10*6/uL (ref 3.70–5.45)
RDW: 19.9 % — ABNORMAL HIGH (ref 11.2–14.5)
WBC: 4.7 10*3/uL (ref 3.9–10.3)
lymph#: 0.3 10*3/uL — ABNORMAL LOW (ref 0.9–3.3)

## 2017-11-13 LAB — TECHNOLOGIST REVIEW

## 2017-11-13 LAB — COMPREHENSIVE METABOLIC PANEL
ALT: 24 U/L (ref 0–55)
AST: 24 U/L (ref 5–34)
Albumin: 3.3 g/dL — ABNORMAL LOW (ref 3.5–5.0)
Alkaline Phosphatase: 83 U/L (ref 40–150)
Anion Gap: 10 mEq/L (ref 3–11)
BUN: 11.6 mg/dL (ref 7.0–26.0)
CHLORIDE: 104 meq/L (ref 98–109)
CO2: 27 meq/L (ref 22–29)
Calcium: 9.3 mg/dL (ref 8.4–10.4)
Creatinine: 0.8 mg/dL (ref 0.6–1.1)
GLUCOSE: 113 mg/dL (ref 70–140)
POTASSIUM: 4.1 meq/L (ref 3.5–5.1)
SODIUM: 141 meq/L (ref 136–145)
Total Bilirubin: 0.38 mg/dL (ref 0.20–1.20)
Total Protein: 6.2 g/dL — ABNORMAL LOW (ref 6.4–8.3)

## 2017-11-13 MED ORDER — SODIUM CHLORIDE 0.9% FLUSH
10.0000 mL | INTRAVENOUS | Status: DC | PRN
  Administered 2017-11-13: 10 mL
  Filled 2017-11-13: qty 10

## 2017-11-13 MED ORDER — SODIUM CHLORIDE 0.9 % IV SOLN: 45.0000 mg/m2 | Freq: Once | INTRAVENOUS | Status: DC

## 2017-11-13 MED ORDER — PALONOSETRON HCL INJECTION 0.25 MG/5ML
INTRAVENOUS | Status: AC
  Filled 2017-11-13: qty 5

## 2017-11-13 MED ORDER — DEXAMETHASONE SODIUM PHOSPHATE 10 MG/ML IJ SOLN
10.0000 mg | Freq: Once | INTRAMUSCULAR | Status: AC
  Administered 2017-11-13: 10 mg via INTRAVENOUS

## 2017-11-13 MED ORDER — SODIUM CHLORIDE 0.9 % IV SOLN
375.0000 mg/m2 | Freq: Once | INTRAVENOUS | Status: AC
  Administered 2017-11-13: 600 mg via INTRAVENOUS
  Filled 2017-11-13: qty 50

## 2017-11-13 MED ORDER — SODIUM CHLORIDE 0.9 % IV SOLN
Freq: Once | INTRAVENOUS | Status: AC
  Administered 2017-11-13: 10:00:00 via INTRAVENOUS

## 2017-11-13 MED ORDER — ACETAMINOPHEN 325 MG PO TABS
650.0000 mg | ORAL_TABLET | Freq: Once | ORAL | Status: AC
  Administered 2017-11-13: 650 mg via ORAL

## 2017-11-13 MED ORDER — ACETAMINOPHEN 325 MG PO TABS
ORAL_TABLET | ORAL | Status: AC
  Filled 2017-11-13: qty 2

## 2017-11-13 MED ORDER — SODIUM CHLORIDE 0.9% FLUSH
10.0000 mL | INTRAVENOUS | Status: DC | PRN
  Administered 2017-11-13: 10 mL via INTRAVENOUS
  Filled 2017-11-13: qty 10

## 2017-11-13 MED ORDER — PALONOSETRON HCL INJECTION 0.25 MG/5ML
0.2500 mg | Freq: Once | INTRAVENOUS | Status: AC
  Administered 2017-11-13: 0.25 mg via INTRAVENOUS

## 2017-11-13 MED ORDER — DIPHENHYDRAMINE HCL 25 MG PO CAPS
50.0000 mg | ORAL_CAPSULE | Freq: Once | ORAL | Status: AC
  Administered 2017-11-13: 50 mg via ORAL

## 2017-11-13 MED ORDER — SODIUM CHLORIDE 0.9 % IV SOLN
45.0000 mg/m2 | Freq: Once | INTRAVENOUS | Status: AC
  Administered 2017-11-13: 75 mg via INTRAVENOUS
  Filled 2017-11-13: qty 3

## 2017-11-13 MED ORDER — DIPHENHYDRAMINE HCL 25 MG PO CAPS
ORAL_CAPSULE | ORAL | Status: AC
  Filled 2017-11-13: qty 2

## 2017-11-13 MED ORDER — HEPARIN SOD (PORK) LOCK FLUSH 100 UNIT/ML IV SOLN
500.0000 [IU] | Freq: Once | INTRAVENOUS | Status: AC | PRN
  Administered 2017-11-13: 500 [IU]
  Filled 2017-11-13: qty 5

## 2017-11-13 MED ORDER — DEXAMETHASONE SODIUM PHOSPHATE 10 MG/ML IJ SOLN
INTRAMUSCULAR | Status: AC
  Filled 2017-11-13: qty 1

## 2017-11-13 NOTE — Assessment & Plan Note (Signed)
She has intermittent diarrhea alternating with constipation The patient declined laxative therapy We discussed conservative management of constipation with stool softener and increase fiber intake For diarrhea, I recommend omission or reducing dairy product intake

## 2017-11-13 NOTE — Progress Notes (Signed)
[facility] OFFICE PROGRESS NOTE  Patient Care Team: Cari Caraway, MD as PCP - General (Family Medicine) Irine Seal, MD as Attending Physician (Urology) Jacolyn Reedy, MD as Attending Physician (Cardiology) Heath Lark, MD as Consulting Physician (Hematology and Oncology)  SUMMARY OF ONCOLOGIC HISTORY: Oncology History   Del 13q     CLL (chronic lymphocytic leukemia) ([place])   02/09/2013 Pathology Results    Peripheral Blood Flow Cytometry - FINDINGS CONSISTENT WITH CHRONIC LYMPHOCYTIC LEUKEMIA      09/02/2013 Pathology Results    FISH positive for deletion 13 q      05/22/2017 Imaging    Worsening mesenteric and retroperitoneal adenopathy concerning for worsening lymphoproliferative disorder/lymphoma.  Small cysts in the liver and kidneys are stable.  Prior cholecystectomy.  Aortic atherosclerosis.  Left colonic diverticulosis without active diverticulitis.      08/11/2017 Imaging    Chest Impression:  1. Mild mediastinal and hilar lymphadenopathy. 2. Clusters small axial lymph nodes.  Abdomen / Pelvis Impression:  1. Mild periaortic retroperitoneal adenopathy and moderate central mesenteric adenopathy not changed comparison CT. 2. Mild iliac adenopathy 3. Normal volume spleen. 4. No explanation for RIGHT lower quadrant pain. Cluster of lymph nodes in the ileocecal mesenteries similar to prior.      08/12/2017 Procedure    Placement of single lumen port a cath via right internal jugular vein. The catheter tip lies at the cavo-atrial junction. A power injectable port a cath was placed and is ready for immediate use.      08/14/2017 -  Chemotherapy    She received Bendamustine and Rituxan      11/12/2017 Imaging    1. Continued decrease in mediastinal and hilar lymph nodes with interval resolution of the abdominal and pelvic lymphadenopathy seen on the prior study. There is no lymphadenopathy in the chest, abdomen, or pelvis by CT size criteria  on today's study. No new or progressive interval findings. 2. Aortic Atherosclerois (ICD10-170.0) 3. Hepatic and renal cysts, stable.       INTERVAL HISTORY: Please see below for problem oriented charting. She returns for cycle 4 of chemotherapy and to review CT imaging results In the first few days of the chemotherapy last month, she had some mild diarrhea Subsequently, she have constipation for most of the month She is taking stool softener and drinking prune juice along with increase fruit and vegetable intake to counteract the constipation Her energy level is better with reduced dose chemotherapy and the extra week of break in between cycle of treatment She denies recent infection She is able to increase nutritional supplement She noticed less leg swelling  REVIEW OF SYSTEMS:   Constitutional: Denies fevers, chills or abnormal weight loss Eyes: Denies blurriness of vision Ears, nose, mouth, throat, and face: Denies mucositis or sore throat Respiratory: Denies cough, dyspnea or wheezes Skin: Denies abnormal skin rashes Lymphatics: Denies new lymphadenopathy or easy bruising Neurological:Denies numbness, tingling or new weaknesses Behavioral/Psych: Mood is stable, no new changes  All other systems were reviewed with the patient and are negative.  I have reviewed the past medical history, past surgical history, social history and family history with the patient and they are unchanged from previous note.  ALLERGIES:  is allergic to codeine; demerol [meperidine]; morphine and related; ciprofloxacin; and sulfa antibiotics.  MEDICATIONS:  Current Outpatient Medications  Medication Sig Dispense Refill  . acetaminophen (TYLENOL) 500 MG tablet Take 500 mg by mouth every 6 (six) hours as needed for pain.    Marland Kitchen  acyclovir (ZOVIRAX) 400 MG tablet Take 1 tablet (400 mg total) by mouth daily. 30 tablet 3  . aspirin 81 MG tablet Take 81 mg by mouth daily.    Marland Kitchen atorvastatin (LIPITOR) 40 MG  tablet Take 40 mg by mouth daily.    . Calcium Carb-Cholecalciferol (CALCIUM + D3) 600-200 MG-UNIT TABS Take 1 tablet by mouth 3 (three) times a week.     Marland Kitchen DIGOX 0.125 MG tablet Take 0.125 mg by mouth daily.    . fluticasone (FLONASE) 50 MCG/ACT nasal spray Place 2 sprays into the nose daily.    . folic acid (FOLVITE) 1 MG tablet Take 1 mg by mouth daily.    . hydrochlorothiazide (HYDRODIURIL) 12.5 MG tablet Take 12.5 mg by mouth daily.    Marland Kitchen ketotifen (ZADITOR) 0.025 % ophthalmic solution Apply 1 drop to eye 2 (two) times daily.    Marland Kitchen lidocaine-prilocaine (EMLA) cream Apply to affected area once 30 g 3  . metoprolol (LOPRESSOR) 50 MG tablet Take 50 mg by mouth 2 (two) times daily.    . Multiple Vitamin (MULTIVITAMIN) tablet Take 1 tablet by mouth daily.    . Multiple Vitamins-Minerals (EYE VITAMINS & MINERALS) TABS Take by mouth.    . ondansetron (ZOFRAN) 8 MG tablet Take 1 tablet (8 mg total) by mouth every 8 (eight) hours as needed for refractory nausea / vomiting. 30 tablet 1  . potassium chloride SA (K-DUR,KLOR-CON) 20 MEQ tablet Take 20 mEq by mouth 3 (three) times a week. Mon, Wed, Fri    . predniSONE (DELTASONE) 5 MG tablet Take 1 tablet (5 mg total) by mouth daily with breakfast. 30 tablet 0  . prochlorperazine (COMPAZINE) 10 MG tablet Take 1 tablet (10 mg total) by mouth every 6 (six) hours as needed (Nausea or vomiting). 30 tablet 1   No current facility-administered medications for this visit.    Facility-Administered Medications Ordered in Other Visits  Medication Dose Route Frequency Provider Last Rate Last Dose  . acetaminophen (TYLENOL) tablet 650 mg  650 mg Oral Once Heath Lark, MD      . bendamustine [facility]) 75 mg in sodium chloride 0.9 % 25 mL (2.6786 mg/mL) chemo infusion  45 mg/m2 (Treatment Plan Recorded) Intravenous Once Alvy Bimler, Melik Blancett, MD      . dexamethasone (DECADRON) injection 10 mg  10 mg Intravenous Once Alvy Bimler, Lealer Marsland, MD      . diphenhydrAMINE (BENADRYL) capsule 50  mg  50 mg Oral Once Alvy Bimler, Omarius Grantham, MD      . heparin lock flush 100 unit/mL  500 Units Intracatheter Once PRN Alvy Bimler, Lucan Riner, MD      . palonosetron (ALOXI) injection 0.25 mg  0.25 mg Intravenous Once Alzina Golda, MD      . riTUXimab (RITUXAN) 600 mg in sodium chloride 0.9 % 190 mL chemo infusion  375 mg/m2 (Treatment Plan Recorded) Intravenous Once Bartt Gonzaga, MD      . sodium chloride flush (NS) 0.9 % injection 10 mL  10 mL Intracatheter PRN Alvy Bimler, Christyne Mccain, MD        PHYSICAL EXAMINATION: ECOG PERFORMANCE STATUS: 1 - Symptomatic but completely ambulatory  Vitals:   11/13/17 0843  BP: 134/74  Pulse: 70  Resp: 18  Temp: 98.5 F (36.9 C)  SpO2: 99%   Filed Weights   11/13/17 0843  Weight: 136 lb 12.8 oz (62.1 kg)    GENERAL:alert, no distress and comfortable SKIN: skin color, texture, turgor are normal, no rashes or significant lesions EYES: normal, Conjunctiva are pink  and non-injected, sclera clear OROPHARYNX:no exudate, no erythema and lips, buccal mucosa, and tongue normal  NECK: supple, thyroid normal size, non-tender, without nodularity LYMPH:  no palpable lymphadenopathy in the cervical, axillary or inguinal LUNGS: clear to auscultation and percussion with normal breathing effort HEART: regular rate & rhythm and no murmurs and no lower extremity edema ABDOMEN:abdomen soft, non-tender and normal bowel sounds Musculoskeletal:no cyanosis of digits and no clubbing  NEURO: alert & oriented x 3 with fluent speech, no focal motor/sensory deficits  LABORATORY DATA:  I have reviewed the data as listed    Component Value Date/Time   NA 141 11/13/2017 0753   K 4.1 11/13/2017 0753   CL 104 08/12/2017 0955   CL 104 02/08/2013 1454   CO2 27 11/13/2017 0753   GLUCOSE 113 11/13/2017 0753   GLUCOSE 103 (H) 02/08/2013 1454   BUN 11.6 11/13/2017 0753   CREATININE 0.8 11/13/2017 0753   CALCIUM 9.3 11/13/2017 0753   PROT 6.2 (L) 11/13/2017 0753   ALBUMIN 3.3 (L) 11/13/2017 0753   AST 24  11/13/2017 0753   ALT 24 11/13/2017 0753   ALKPHOS 83 11/13/2017 0753   BILITOT 0.38 11/13/2017 0753   GFRNONAA >60 08/12/2017 0955   GFRAA >60 08/12/2017 0955    No results found for: SPEP, UPEP  Lab Results  Component Value Date   WBC 4.7 11/13/2017   NEUTROABS 3.5 11/13/2017   HGB 10.0 (L) 11/13/2017   HCT 30.6 (L) 11/13/2017   MCV 80.6 11/13/2017   PLT 170 11/13/2017      Chemistry      Component Value Date/Time   NA 141 11/13/2017 0753   K 4.1 11/13/2017 0753   CL 104 08/12/2017 0955   CL 104 02/08/2013 1454   CO2 27 11/13/2017 0753   BUN 11.6 11/13/2017 0753   CREATININE 0.8 11/13/2017 0753      Component Value Date/Time   CALCIUM 9.3 11/13/2017 0753   ALKPHOS 83 11/13/2017 0753   AST 24 11/13/2017 0753   ALT 24 11/13/2017 0753   BILITOT 0.38 11/13/2017 0753       RADIOGRAPHIC STUDIES: I have reviewed imaging study with the patient I have personally reviewed the radiological images as listed and agreed with the findings in the report. Ct Chest W Contrast  Result Date: 11/12/2017 CLINICAL DATA:  CCL.  Assess response to therapy. EXAM: CT CHEST, ABDOMEN, AND PELVIS WITH CONTRAST TECHNIQUE: Multidetector CT imaging of the chest, abdomen and pelvis was performed following the standard protocol during bolus administration of intravenous contrast. CONTRAST:  123mL ISOVUE-300 IOPAMIDOL (ISOVUE-300) INJECTION 61% COMPARISON:  08/11/2017 FINDINGS: CT CHEST FINDINGS Cardiovascular: Heart size upper normal. No pericardial effusion. Atherosclerotic calcification is noted in the wall of the thoracic aorta. Right Port-A-Cath tip is positioned in the distal SVC near the RA junction. Mediastinum/Nodes: Interval decrease in mediastinal lymphadenopathy. The 13 mm short axis precarinal lymph node measured previously is now 8 mm short axis. 8 mm short axis left hilar lymph node measured on the prior study has decreased to 4 mm today. 15 mm index lymph node in the right hilum measured on  the prior study is now 10 mm. No new or progressive lymphadenopathy in the mediastinum, hilar regions, or axillary regions. Stable appearance mild heterogeneity of the thyroid gland bilaterally. Lungs/Pleura: Subtle tree-in-bud nodularity is identified in the upper lobes bilaterally with relative sparing of the lower lobes. No dense focal airspace consolidation. No pulmonary edema or pleural effusion. Musculoskeletal: Bone windows reveal no  worrisome lytic or sclerotic osseous lesions. CT ABDOMEN PELVIS FINDINGS Hepatobiliary: Scattered well-defined low-density liver lesions are similar to prior. Dominant 2.7 cm lesion posterior right liver is unchanged and measures water attenuation, compatible with cyst. Gallbladder surgically absent. Common bile duct distention up to 11 mm is unchanged in the interval. Pancreas: No focal mass lesion. No dilatation of the main duct. No intraparenchymal cyst. No peripancreatic edema. Spleen: No splenomegaly. No focal mass lesion. Adrenals/Urinary Tract: No adrenal nodule or mass. Stable tiny hypoattenuating renal lesions bilaterally, likely cysts. No evidence for hydroureter. The urinary bladder appears normal for the degree of distention. Stomach/Bowel: Stomach is nondistended. No gastric wall thickening. No evidence of outlet obstruction. Duodenum is normally positioned as is the ligament of Treitz. No small bowel wall thickening. No small bowel dilatation. The terminal ileum is normal. The appendix is not visualized, but there is no edema or inflammation in the region of the cecum. Diverticular changes are noted in the left colon without evidence of diverticulitis. Vascular/Lymphatic: There is abdominal aortic atherosclerosis without aneurysm. There is no gastrohepatic or hepatoduodenal ligament lymphadenopathy. No intraperitoneal or retroperitoneal lymphadenopathy. The small para-aortic lymph nodes seen on the prior study have decreased in the interval. 11 mm short axis left  para-aortic lymph node measured previously is now 7 mm short axis. 4 mm short axis left periaortic lymph node seen on image 69 of series 2 today was 10 mm short axis when I remeasure on the prior study. One of the more dominant mesenteric lymph nodes measured at 18 mm short axis on the prior study has decreased in the interval, measuring 5 mm short axis on image 89 of series 2 today. Other lymphadenopathy in the ileocolic and central small bowel mesentery shows similar decrease. 8 mm short axis central mesenteric lymph node seen on image 83 series 2 today was 23 mm when I measure it on the prior exam. The 13 mm short axis left pelvic sidewall lymph node measured on the prior study has decreased to 3 mm short axis on today's exam. Reproductive: Uterus surgically absent.  There is no adnexal mass. Other: No intraperitoneal free fluid. Musculoskeletal: Status post right hip replacement. Thoracolumbar scoliosis evident without worrisome lytic or sclerotic osseous abnormality. IMPRESSION: 1. Continued decrease in mediastinal and hilar lymph nodes with interval resolution of the abdominal and pelvic lymphadenopathy seen on the prior study. There is no lymphadenopathy in the chest, abdomen, or pelvis by CT size criteria on today's study. No new or progressive interval findings. 2.  Aortic Atherosclerois (ICD10-170.0) 3. Hepatic and renal cysts, stable. Electronically Signed   By: Misty Stanley M.D.   On: 11/12/2017 12:31   Ct Abdomen Pelvis W Contrast  Result Date: 11/12/2017 CLINICAL DATA:  CCL.  Assess response to therapy. EXAM: CT CHEST, ABDOMEN, AND PELVIS WITH CONTRAST TECHNIQUE: Multidetector CT imaging of the chest, abdomen and pelvis was performed following the standard protocol during bolus administration of intravenous contrast. CONTRAST:  179mL ISOVUE-300 IOPAMIDOL (ISOVUE-300) INJECTION 61% COMPARISON:  08/11/2017 FINDINGS: CT CHEST FINDINGS Cardiovascular: Heart size upper normal. No pericardial effusion.  Atherosclerotic calcification is noted in the wall of the thoracic aorta. Right Port-A-Cath tip is positioned in the distal SVC near the RA junction. Mediastinum/Nodes: Interval decrease in mediastinal lymphadenopathy. The 13 mm short axis precarinal lymph node measured previously is now 8 mm short axis. 8 mm short axis left hilar lymph node measured on the prior study has decreased to 4 mm today. 15 mm index lymph node in  the right hilum measured on the prior study is now 10 mm. No new or progressive lymphadenopathy in the mediastinum, hilar regions, or axillary regions. Stable appearance mild heterogeneity of the thyroid gland bilaterally. Lungs/Pleura: Subtle tree-in-bud nodularity is identified in the upper lobes bilaterally with relative sparing of the lower lobes. No dense focal airspace consolidation. No pulmonary edema or pleural effusion. Musculoskeletal: Bone windows reveal no worrisome lytic or sclerotic osseous lesions. CT ABDOMEN PELVIS FINDINGS Hepatobiliary: Scattered well-defined low-density liver lesions are similar to prior. Dominant 2.7 cm lesion posterior right liver is unchanged and measures water attenuation, compatible with cyst. Gallbladder surgically absent. Common bile duct distention up to 11 mm is unchanged in the interval. Pancreas: No focal mass lesion. No dilatation of the main duct. No intraparenchymal cyst. No peripancreatic edema. Spleen: No splenomegaly. No focal mass lesion. Adrenals/Urinary Tract: No adrenal nodule or mass. Stable tiny hypoattenuating renal lesions bilaterally, likely cysts. No evidence for hydroureter. The urinary bladder appears normal for the degree of distention. Stomach/Bowel: Stomach is nondistended. No gastric wall thickening. No evidence of outlet obstruction. Duodenum is normally positioned as is the ligament of Treitz. No small bowel wall thickening. No small bowel dilatation. The terminal ileum is normal. The appendix is not visualized, but there is no  edema or inflammation in the region of the cecum. Diverticular changes are noted in the left colon without evidence of diverticulitis. Vascular/Lymphatic: There is abdominal aortic atherosclerosis without aneurysm. There is no gastrohepatic or hepatoduodenal ligament lymphadenopathy. No intraperitoneal or retroperitoneal lymphadenopathy. The small para-aortic lymph nodes seen on the prior study have decreased in the interval. 11 mm short axis left para-aortic lymph node measured previously is now 7 mm short axis. 4 mm short axis left periaortic lymph node seen on image 69 of series 2 today was 10 mm short axis when I remeasure on the prior study. One of the more dominant mesenteric lymph nodes measured at 18 mm short axis on the prior study has decreased in the interval, measuring 5 mm short axis on image 89 of series 2 today. Other lymphadenopathy in the ileocolic and central small bowel mesentery shows similar decrease. 8 mm short axis central mesenteric lymph node seen on image 83 series 2 today was 23 mm when I measure it on the prior exam. The 13 mm short axis left pelvic sidewall lymph node measured on the prior study has decreased to 3 mm short axis on today's exam. Reproductive: Uterus surgically absent.  There is no adnexal mass. Other: No intraperitoneal free fluid. Musculoskeletal: Status post right hip replacement. Thoracolumbar scoliosis evident without worrisome lytic or sclerotic osseous abnormality. IMPRESSION: 1. Continued decrease in mediastinal and hilar lymph nodes with interval resolution of the abdominal and pelvic lymphadenopathy seen on the prior study. There is no lymphadenopathy in the chest, abdomen, or pelvis by CT size criteria on today's study. No new or progressive interval findings. 2.  Aortic Atherosclerois (ICD10-170.0) 3. Hepatic and renal cysts, stable. Electronically Signed   By: Misty Stanley M.D.   On: 11/12/2017 12:31    ASSESSMENT & PLAN:  CLL (chronic lymphocytic  leukemia) I have reviewed recent imaging study She have complete response to treatment based on CT criteria We discussed whether to stop at cycle 4 versus extending to cycle 6 and she is in favor of stopping at cycle 6 I recommend we continue on 50% reduced dose of bendamustine given her symptoms of bowel habit changes and anemia I would also lengthen each cycle to  every 5 weeks interval to allow more time for her recovery from each treatment cycle She is in agreement with the plan  Anemia due to antineoplastic chemotherapy This is due to side effects of chemotherapy She is symptomatic with fatigue I plan to continue on reduced dose of chemotherapy by 50% but keep Rituxan at 100% dose I will also keep interval of 5 weeks in between treatment   Mild protein-calorie malnutrition ([place]) This has improved with additional nutritional supplements  Other constipation She has intermittent diarrhea alternating with constipation The patient declined laxative therapy We discussed conservative management of constipation with stool softener and increase fiber intake For diarrhea, I recommend omission or reducing dairy product intake   No orders of the defined types were placed in this encounter.  All questions were answered. The patient knows to call the clinic with any problems, questions or concerns. No barriers to learning was detected. I spent 25 minutes counseling the patient face to face. The total time spent in the appointment was 30 minutes and more than 50% was on counseling and review of test results     Heath Lark, MD 11/13/2017 9:38 AM

## 2017-11-13 NOTE — Assessment & Plan Note (Signed)
This is due to side effects of chemotherapy She is symptomatic with fatigue I plan to continue on reduced dose of chemotherapy by 50% but keep Rituxan at 100% dose I will also keep interval of 5 weeks in between treatment

## 2017-11-13 NOTE — Telephone Encounter (Signed)
Gave patient AVS and calendar of upcoming February appointments.  °

## 2017-11-13 NOTE — Patient Instructions (Signed)
[facility] Discharge Instructions for Patients Receiving Chemotherapy  Today you received the following chemotherapy agents:  Rituxan and Bendeka.  To help prevent nausea and vomiting after your treatment, we encourage you to take your nausea medication as directed.   If you develop nausea and vomiting that is not controlled by your nausea medication, call the clinic.   BELOW ARE SYMPTOMS THAT SHOULD BE REPORTED IMMEDIATELY:  *FEVER GREATER THAN 100.5 F  *CHILLS WITH OR WITHOUT FEVER  NAUSEA AND VOMITING THAT IS NOT CONTROLLED WITH YOUR NAUSEA MEDICATION  *UNUSUAL SHORTNESS OF BREATH  *UNUSUAL BRUISING OR BLEEDING  TENDERNESS IN MOUTH AND THROAT WITH OR WITHOUT PRESENCE OF ULCERS  *URINARY PROBLEMS  *BOWEL PROBLEMS  UNUSUAL RASH Items with * indicate a potential emergency and should be followed up as soon as possible.  Feel free to call the clinic should you have any questions or concerns. The clinic phone number is [phone].  Please show the CHEMO ALERT CARD at check-in to the Emergency Department and triage nurse.   

## 2017-11-13 NOTE — Patient Instructions (Signed)
Implanted Port Home Guide An implanted port is a type of central line that is placed under the skin. Central lines are used to provide IV access when treatment or nutrition needs to be given through a person's veins. Implanted ports are used for long-term IV access. An implanted port may be placed because:  You need IV medicine that would be irritating to the small veins in your hands or arms.  You need long-term IV medicines, such as antibiotics.  You need IV nutrition for a long period.  You need frequent blood draws for lab tests.  You need dialysis.  Implanted ports are usually placed in the chest area, but they can also be placed in the upper arm, the abdomen, or the leg. An implanted port has two main parts:  Reservoir. The reservoir is round and will appear as a small, raised area under your skin. The reservoir is the part where a needle is inserted to give medicines or draw blood.  Catheter. The catheter is a thin, flexible tube that extends from the reservoir. The catheter is placed into a large vein. Medicine that is inserted into the reservoir goes into the catheter and then into the vein.  How will I care for my incision site? Do not get the incision site wet. Bathe or shower as directed by your health care provider. How is my port accessed? Special steps must be taken to access the port:  Before the port is accessed, a numbing cream can be placed on the skin. This helps numb the skin over the port site.  Your health care provider uses a sterile technique to access the port. ? Your health care provider must put on a mask and sterile gloves. ? The skin over your port is cleaned carefully with an antiseptic and allowed to dry. ? The port is gently pinched between sterile gloves, and a needle is inserted into the port.  Only "non-coring" port needles should be used to access the port. Once the port is accessed, a blood return should be checked. This helps ensure that the port  is in the vein and is not clogged.  If your port needs to remain accessed for a constant infusion, a clear (transparent) bandage will be placed over the needle site. The bandage and needle will need to be changed every week, or as directed by your health care provider.  Keep the bandage covering the needle clean and dry. Do not get it wet. Follow your health care provider's instructions on how to take a shower or bath while the port is accessed.  If your port does not need to stay accessed, no bandage is needed over the port.  What is flushing? Flushing helps keep the port from getting clogged. Follow your health care provider's instructions on how and when to flush the port. Ports are usually flushed with saline solution or a medicine called heparin. The need for flushing will depend on how the port is used.  If the port is used for intermittent medicines or blood draws, the port will need to be flushed: ? After medicines have been given. ? After blood has been drawn. ? As part of routine maintenance.  If a constant infusion is running, the port may not need to be flushed.  How long will my port stay implanted? The port can stay in for as long as your health care provider thinks it is needed. When it is time for the port to come out, surgery will be   done to remove it. The procedure is similar to the one performed when the port was put in. When should I seek immediate medical care? When you have an implanted port, you should seek immediate medical care if:  You notice a bad smell coming from the incision site.  You have swelling, redness, or drainage at the incision site.  You have more swelling or pain at the port site or the surrounding area.  You have a fever that is not controlled with medicine.  This information is not intended to replace advice given to you by your health care provider. Make sure you discuss any questions you have with your health care provider. Document  Released: 10/28/2005 Document Revised: 04/04/2016 Document Reviewed: 07/05/2013 Elsevier Interactive Patient Education  2017 [employer]  

## 2017-11-13 NOTE — Assessment & Plan Note (Signed)
I have reviewed recent imaging study She have complete response to treatment based on CT criteria We discussed whether to stop at cycle 4 versus extending to cycle 6 and she is in favor of stopping at cycle 6 I recommend we continue on 50% reduced dose of bendamustine given her symptoms of bowel habit changes and anemia I would also lengthen each cycle to every 5 weeks interval to allow more time for her recovery from each treatment cycle She is in agreement with the plan

## 2017-11-13 NOTE — Assessment & Plan Note (Signed)
This has improved with additional nutritional supplements

## 2017-11-14 ENCOUNTER — Ambulatory Visit (HOSPITAL_BASED_OUTPATIENT_CLINIC_OR_DEPARTMENT_OTHER): Payer: Medicare Other

## 2017-11-14 VITALS — BP 125/61 | HR 67 | Temp 98.0°F | Resp 17 | Wt 137.2 lb

## 2017-11-14 DIAGNOSIS — Z5111 Encounter for antineoplastic chemotherapy: Secondary | ICD-10-CM | POA: Diagnosis not present

## 2017-11-14 DIAGNOSIS — C9110 Chronic lymphocytic leukemia of B-cell type not having achieved remission: Secondary | ICD-10-CM

## 2017-11-14 MED ORDER — SODIUM CHLORIDE 0.9 % IV SOLN
Freq: Once | INTRAVENOUS | Status: AC
  Administered 2017-11-14: 08:00:00 via INTRAVENOUS

## 2017-11-14 MED ORDER — DEXAMETHASONE SODIUM PHOSPHATE 10 MG/ML IJ SOLN
10.0000 mg | Freq: Once | INTRAMUSCULAR | Status: AC
  Administered 2017-11-14: 10 mg via INTRAVENOUS

## 2017-11-14 MED ORDER — SODIUM CHLORIDE 0.9% FLUSH
10.0000 mL | INTRAVENOUS | Status: DC | PRN
  Administered 2017-11-14: 10 mL
  Filled 2017-11-14: qty 10

## 2017-11-14 MED ORDER — HEPARIN SOD (PORK) LOCK FLUSH 100 UNIT/ML IV SOLN
500.0000 [IU] | Freq: Once | INTRAVENOUS | Status: AC | PRN
  Administered 2017-11-14: 500 [IU]
  Filled 2017-11-14: qty 5

## 2017-11-14 MED ORDER — SODIUM CHLORIDE 0.9 % IV SOLN
45.0000 mg/m2 | Freq: Once | INTRAVENOUS | Status: AC
  Administered 2017-11-14: 75 mg via INTRAVENOUS
  Filled 2017-11-14: qty 3

## 2017-11-14 MED ORDER — DEXAMETHASONE SODIUM PHOSPHATE 10 MG/ML IJ SOLN
INTRAMUSCULAR | Status: AC
  Filled 2017-11-14: qty 1

## 2017-11-14 NOTE — Patient Instructions (Signed)
[facility] Discharge Instructions for Patients Receiving Chemotherapy  Today you received the following chemotherapy agents Bendeka.   To help prevent nausea and vomiting after your treatment, we encourage you to take your nausea medication as prescribed.    If you develop nausea and vomiting that is not controlled by your nausea medication, call the clinic.   BELOW ARE SYMPTOMS THAT SHOULD BE REPORTED IMMEDIATELY:  *FEVER GREATER THAN 100.5 F  *CHILLS WITH OR WITHOUT FEVER  NAUSEA AND VOMITING THAT IS NOT CONTROLLED WITH YOUR NAUSEA MEDICATION  *UNUSUAL SHORTNESS OF BREATH  *UNUSUAL BRUISING OR BLEEDING  TENDERNESS IN MOUTH AND THROAT WITH OR WITHOUT PRESENCE OF ULCERS  *URINARY PROBLEMS  *BOWEL PROBLEMS  UNUSUAL RASH Items with * indicate a potential emergency and should be followed up as soon as possible.  Feel free to call the clinic should you have any questions or concerns. The clinic phone number is [phone].  Please show the CHEMO ALERT CARD at check-in to the Emergency Department and triage nurse.  

## 2017-12-04 ENCOUNTER — Other Ambulatory Visit: Payer: Self-pay | Admitting: Hematology and Oncology

## 2017-12-04 DIAGNOSIS — C9110 Chronic lymphocytic leukemia of B-cell type not having achieved remission: Secondary | ICD-10-CM

## 2017-12-18 ENCOUNTER — Inpatient Hospital Stay (HOSPITAL_BASED_OUTPATIENT_CLINIC_OR_DEPARTMENT_OTHER): Payer: Medicare Other | Admitting: Hematology and Oncology

## 2017-12-18 ENCOUNTER — Inpatient Hospital Stay: Payer: Medicare Other

## 2017-12-18 ENCOUNTER — Telehealth: Payer: Self-pay | Admitting: Hematology and Oncology

## 2017-12-18 ENCOUNTER — Inpatient Hospital Stay: Payer: Medicare Other | Attending: Hematology and Oncology

## 2017-12-18 ENCOUNTER — Encounter: Payer: Self-pay | Admitting: Hematology and Oncology

## 2017-12-18 VITALS — BP 120/61 | HR 72 | Temp 98.9°F | Resp 18 | Ht 62.0 in | Wt 137.9 lb

## 2017-12-18 DIAGNOSIS — T451X5A Adverse effect of antineoplastic and immunosuppressive drugs, initial encounter: Secondary | ICD-10-CM | POA: Diagnosis not present

## 2017-12-18 DIAGNOSIS — Z95828 Presence of other vascular implants and grafts: Secondary | ICD-10-CM

## 2017-12-18 DIAGNOSIS — C9110 Chronic lymphocytic leukemia of B-cell type not having achieved remission: Secondary | ICD-10-CM | POA: Insufficient documentation

## 2017-12-18 DIAGNOSIS — D61810 Antineoplastic chemotherapy induced pancytopenia: Secondary | ICD-10-CM | POA: Diagnosis not present

## 2017-12-18 DIAGNOSIS — D61818 Other pancytopenia: Secondary | ICD-10-CM

## 2017-12-18 LAB — COMPREHENSIVE METABOLIC PANEL
ALBUMIN: 3.8 g/dL (ref 3.5–5.0)
ALT: 25 U/L (ref 0–55)
AST: 23 U/L (ref 5–34)
Alkaline Phosphatase: 84 U/L (ref 40–150)
Anion gap: 9 (ref 3–11)
BUN: 17 mg/dL (ref 7–26)
CHLORIDE: 106 mmol/L (ref 98–109)
CO2: 28 mmol/L (ref 22–29)
CREATININE: 0.83 mg/dL (ref 0.60–1.10)
Calcium: 9.4 mg/dL (ref 8.4–10.4)
GFR calc non Af Amer: >60 mL/min (ref >60)
GLUCOSE: 120 mg/dL (ref 70–140)
Potassium: 3.9 mmol/L (ref 3.5–5.1)
SODIUM: 143 mmol/L (ref 136–145)
Total Bilirubin: 0.7 mg/dL (ref 0.2–1.2)
Total Protein: 6.2 g/dL — ABNORMAL LOW (ref 6.4–8.3)

## 2017-12-18 LAB — CBC WITH DIFFERENTIAL/PLATELET
Basophils Absolute: 0.1 10*3/uL (ref 0.0–0.1)
Basophils Relative: 2 %
EOS ABS: 0.2 10*3/uL (ref 0.0–0.5)
Eosinophils Relative: 10 %
HEMATOCRIT: 33.9 % — AB (ref 34.8–46.6)
HEMOGLOBIN: 11.3 g/dL — AB (ref 11.6–15.9)
LYMPHS ABS: 0.7 10*3/uL — AB (ref 0.9–3.3)
Lymphocytes Relative: 33 %
MCH: 27.2 pg (ref 25.1–34.0)
MCHC: 33.3 g/dL (ref 31.5–36.0)
MCV: 81.7 fL (ref 79.5–101.0)
Monocytes Absolute: 0.6 10*3/uL (ref 0.1–0.9)
Monocytes Relative: 29 %
NEUTROS PCT: 26 %
Neutro Abs: 0.6 10*3/uL — ABNORMAL LOW (ref 1.5–6.5)
Platelets: 110 10*3/uL — ABNORMAL LOW (ref 145–400)
RBC: 4.15 MIL/uL (ref 3.70–5.45)
RDW: 17.1 % — ABNORMAL HIGH (ref 11.2–14.5)
WBC: 2.2 10*3/uL — ABNORMAL LOW (ref 3.9–10.3)

## 2017-12-18 MED ORDER — SODIUM CHLORIDE 0.9% FLUSH
10.0000 mL | INTRAVENOUS | Status: DC | PRN
  Administered 2017-12-18: 10 mL via INTRAVENOUS
  Filled 2017-12-18: qty 10

## 2017-12-18 MED ORDER — HEPARIN SOD (PORK) LOCK FLUSH 100 UNIT/ML IV SOLN
500.0000 [IU] | Freq: Once | INTRAVENOUS | Status: AC | PRN
  Administered 2017-12-18: 500 [IU] via INTRAVENOUS
  Filled 2017-12-18: qty 5

## 2017-12-18 NOTE — Telephone Encounter (Signed)
Gave patient AVS and calendar of upcoming February appointments.  °

## 2017-12-18 NOTE — Assessment & Plan Note (Signed)
She has acquired pancytopenia due to recent treatment We discussed neutropenic precaution She does not need further workup, treatment or prophylactic antibiotics

## 2017-12-18 NOTE — Assessment & Plan Note (Signed)
Despite dose reduction and increasing the interval before treating cycles of treatment, she has developed profound pancytopenia I would cancel her treatment today and tomorrow Plan to see her back in 2 weeks to repeat blood work to ensure resolution of pancytopenia I recommend discontinuation of chemotherapy, especially the fact that her most recent CT scan showed complete remission.  She agree with the plan of care.

## 2017-12-18 NOTE — Progress Notes (Signed)
[place] OFFICE PROGRESS NOTE  Patient Care Team: Cari Caraway, MD as PCP - General (Family Medicine) Irine Seal, MD as Attending Physician (Urology) Jacolyn Reedy, MD as Attending Physician (Cardiology) Heath Lark, MD as Consulting Physician (Hematology and Oncology)  SUMMARY OF ONCOLOGIC HISTORY: Oncology History   Del 13q     CLL (chronic lymphocytic leukemia) ([place])   02/09/2013 Pathology Results    Peripheral Blood Flow Cytometry - FINDINGS CONSISTENT WITH CHRONIC LYMPHOCYTIC LEUKEMIA      09/02/2013 Pathology Results    FISH positive for deletion 13 q      05/22/2017 Imaging    Worsening mesenteric and retroperitoneal adenopathy concerning for worsening lymphoproliferative disorder/lymphoma.  Small cysts in the liver and kidneys are stable.  Prior cholecystectomy.  Aortic atherosclerosis.  Left colonic diverticulosis without active diverticulitis.      08/11/2017 Imaging    Chest Impression:  1. Mild mediastinal and hilar lymphadenopathy. 2. Clusters small axial lymph nodes.  Abdomen / Pelvis Impression:  1. Mild periaortic retroperitoneal adenopathy and moderate central mesenteric adenopathy not changed comparison CT. 2. Mild iliac adenopathy 3. Normal volume spleen. 4. No explanation for RIGHT lower quadrant pain. Cluster of lymph nodes in the ileocecal mesenteries similar to prior.      08/12/2017 Procedure    Placement of single lumen port a cath via right internal jugular vein. The catheter tip lies at the cavo-atrial junction. A power injectable port a cath was placed and is ready for immediate use.      08/14/2017 -  Chemotherapy    She received Bendamustine and Rituxan      11/12/2017 Imaging    1. Continued decrease in mediastinal and hilar lymph nodes with interval resolution of the abdominal and pelvic lymphadenopathy seen on the prior study. There is no lymphadenopathy in the chest, abdomen, or pelvis by CT size criteria  on today's study. No new or progressive interval findings. 2. Aortic Atherosclerois (ICD10-170.0) 3. Hepatic and renal cysts, stable.      12/18/2017 Adverse Reaction    We are unable to resume cycle 5 of chemotherapy due to prolonged pancytopenia       INTERVAL HISTORY: Please see below for problem oriented charting. She returns today to resume cycle 5 of chemotherapy She denies recent fever or chills Appetite is stable, no recent weight loss The patient denies any recent signs or symptoms of bleeding such as spontaneous epistaxis, hematuria or hematochezia.  REVIEW OF SYSTEMS:   Constitutional: Denies fevers, chills or abnormal weight loss Eyes: Denies blurriness of vision Ears, nose, mouth, throat, and face: Denies mucositis or sore throat Respiratory: Denies cough, dyspnea or wheezes Cardiovascular: Denies palpitation, chest discomfort or lower extremity swelling Gastrointestinal:  Denies nausea, heartburn or change in bowel habits Skin: Denies abnormal skin rashes Lymphatics: Denies new lymphadenopathy or easy bruising Neurological:Denies numbness, tingling or new weaknesses Behavioral/Psych: Mood is stable, no new changes  All other systems were reviewed with the patient and are negative.  I have reviewed the past medical history, past surgical history, social history and family history with the patient and they are unchanged from previous note.  ALLERGIES:  is allergic to codeine; demerol [meperidine]; morphine and related; ciprofloxacin; and sulfa antibiotics.  MEDICATIONS:  Current Outpatient Medications  Medication Sig Dispense Refill  . acetaminophen (TYLENOL) 500 MG tablet Take 500 mg by mouth every 6 (six) hours as needed for pain.    Marland Kitchen acyclovir (ZOVIRAX) 400 MG tablet TAKE ONE TABLET BY MOUTH  DAILY 30 tablet 2  . aspirin 81 MG tablet Take 81 mg by mouth daily.    Marland Kitchen atorvastatin (LIPITOR) 40 MG tablet Take 40 mg by mouth daily.    . Calcium Carb-Cholecalciferol  (CALCIUM + D3) 600-200 MG-UNIT TABS Take 1 tablet by mouth 3 (three) times a week.     Marland Kitchen DIGOX 0.125 MG tablet Take 0.125 mg by mouth daily.    . fluticasone (FLONASE) 50 MCG/ACT nasal spray Place 2 sprays into the nose daily.    . folic acid (FOLVITE) 1 MG tablet Take 1 mg by mouth daily.    . hydrochlorothiazide (HYDRODIURIL) 12.5 MG tablet Take 12.5 mg by mouth daily.    Marland Kitchen ketotifen (ZADITOR) 0.025 % ophthalmic solution Apply 1 drop to eye 2 (two) times daily.    Marland Kitchen lidocaine-prilocaine (EMLA) cream Apply to affected area once 30 g 3  . metoprolol (LOPRESSOR) 50 MG tablet Take 50 mg by mouth 2 (two) times daily.    . Multiple Vitamin (MULTIVITAMIN) tablet Take 1 tablet by mouth daily.    . Multiple Vitamins-Minerals (EYE VITAMINS & MINERALS) TABS Take by mouth.    . ondansetron (ZOFRAN) 8 MG tablet TAKE ONE TABLET BY MOUTH EVERY 8 HOURS AS NEEDED FOR REFRACTORY FOR NAUSEA AND VOMITING 30 tablet 0  . potassium chloride SA (K-DUR,KLOR-CON) 20 MEQ tablet Take 20 mEq by mouth 3 (three) times a week. Mon, Wed, Fri    . predniSONE (DELTASONE) 5 MG tablet Take 1 tablet (5 mg total) by mouth daily with breakfast. 30 tablet 0  . prochlorperazine (COMPAZINE) 10 MG tablet Take 1 tablet (10 mg total) by mouth every 6 (six) hours as needed (Nausea or vomiting). 30 tablet 1   Current Facility-Administered Medications  Medication Dose Route Frequency Provider Last Rate Last Dose  . sodium chloride flush (NS) 0.9 % injection 10 mL  10 mL Intravenous PRN Alvy Bimler, Brynlei Klausner, MD   10 mL at 12/18/17 0920    PHYSICAL EXAMINATION: ECOG PERFORMANCE STATUS: 0 - Asymptomatic  Vitals:   12/18/17 0850  BP: 120/61  Pulse: 72  Resp: 18  Temp: 98.9 F (37.2 C)  SpO2: 97%   Filed Weights   12/18/17 0850  Weight: 137 lb 14.4 oz (62.6 kg)    GENERAL:alert, no distress and comfortable SKIN: skin color, texture, turgor are normal, no rashes or significant lesions EYES: normal, Conjunctiva are pink and non-injected,  sclera clear OROPHARYNX:no exudate, no erythema and lips, buccal mucosa, and tongue normal  NECK: supple, thyroid normal size, non-tender, without nodularity LYMPH:  no palpable lymphadenopathy in the cervical, axillary or inguinal LUNGS: clear to auscultation and percussion with normal breathing effort HEART: regular rate & rhythm and no murmurs and no lower extremity edema ABDOMEN:abdomen soft, non-tender and normal bowel sounds Musculoskeletal:no cyanosis of digits and no clubbing  NEURO: alert & oriented x 3 with fluent speech, no focal motor/sensory deficits  LABORATORY DATA:  I have reviewed the data as listed    Component Value Date/Time   NA 143 12/18/2017 0815   NA 141 11/13/2017 0753   K 3.9 12/18/2017 0815   K 4.1 11/13/2017 0753   CL 106 12/18/2017 0815   CL 104 02/08/2013 1454   CO2 28 12/18/2017 0815   CO2 27 11/13/2017 0753   GLUCOSE 120 12/18/2017 0815   GLUCOSE 113 11/13/2017 0753   GLUCOSE 103 (H) 02/08/2013 1454   BUN 17 12/18/2017 0815   BUN 11.6 11/13/2017 0753   CREATININE 0.83 12/18/2017  0815   CREATININE 0.8 11/13/2017 0753   CALCIUM 9.4 12/18/2017 0815   CALCIUM 9.3 11/13/2017 0753   PROT 6.2 (L) 12/18/2017 0815   PROT 6.2 (L) 11/13/2017 0753   ALBUMIN 3.8 12/18/2017 0815   ALBUMIN 3.3 (L) 11/13/2017 0753   AST 23 12/18/2017 0815   AST 24 11/13/2017 0753   ALT 25 12/18/2017 0815   ALT 24 11/13/2017 0753   ALKPHOS 84 12/18/2017 0815   ALKPHOS 83 11/13/2017 0753   BILITOT 0.7 12/18/2017 0815   BILITOT 0.38 11/13/2017 0753   GFRNONAA >60 12/18/2017 0815   GFRAA >60 12/18/2017 0815    No results found for: SPEP, UPEP  Lab Results  Component Value Date   WBC 2.2 (L) 12/18/2017   NEUTROABS 0.6 (L) 12/18/2017   HGB 11.3 (L) 12/18/2017   HCT 33.9 (L) 12/18/2017   MCV 81.7 12/18/2017   PLT 110 (L) 12/18/2017      Chemistry      Component Value Date/Time   NA 143 12/18/2017 0815   NA 141 11/13/2017 0753   K 3.9 12/18/2017 0815   K 4.1  11/13/2017 0753   CL 106 12/18/2017 0815   CL 104 02/08/2013 1454   CO2 28 12/18/2017 0815   CO2 27 11/13/2017 0753   BUN 17 12/18/2017 0815   BUN 11.6 11/13/2017 0753   CREATININE 0.83 12/18/2017 0815   CREATININE 0.8 11/13/2017 0753      Component Value Date/Time   CALCIUM 9.4 12/18/2017 0815   CALCIUM 9.3 11/13/2017 0753   ALKPHOS 84 12/18/2017 0815   ALKPHOS 83 11/13/2017 0753   AST 23 12/18/2017 0815   AST 24 11/13/2017 0753   ALT 25 12/18/2017 0815   ALT 24 11/13/2017 0753   BILITOT 0.7 12/18/2017 0815   BILITOT 0.38 11/13/2017 0753      ASSESSMENT & PLAN:  CLL (chronic lymphocytic leukemia) Despite dose reduction and increasing the interval before treating cycles of treatment, she has developed profound pancytopenia I would cancel her treatment today and tomorrow Plan to see her back in 2 weeks to repeat blood work to ensure resolution of pancytopenia I recommend discontinuation of chemotherapy, especially the fact that her most recent CT scan showed complete remission.  She agree with the plan of care.  Pancytopenia, acquired [facility]) She has acquired pancytopenia due to recent treatment We discussed neutropenic precaution She does not need further workup, treatment or prophylactic antibiotics   No orders of the defined types were placed in this encounter.  All questions were answered. The patient knows to call the clinic with any problems, questions or concerns. No barriers to learning was detected. I spent 15 minutes counseling the patient face to face. The total time spent in the appointment was 20 minutes and more than 50% was on counseling and review of test results     Heath Lark, MD 12/18/2017 1:37 PM

## 2017-12-19 ENCOUNTER — Ambulatory Visit: Payer: Medicare Other

## 2017-12-31 ENCOUNTER — Encounter: Payer: Self-pay | Admitting: Hematology and Oncology

## 2017-12-31 ENCOUNTER — Inpatient Hospital Stay (HOSPITAL_BASED_OUTPATIENT_CLINIC_OR_DEPARTMENT_OTHER): Payer: Medicare Other | Admitting: Hematology and Oncology

## 2017-12-31 ENCOUNTER — Inpatient Hospital Stay: Payer: Medicare Other

## 2017-12-31 VITALS — BP 139/60 | HR 74 | Temp 98.4°F | Resp 20 | Ht 62.0 in | Wt 139.3 lb

## 2017-12-31 DIAGNOSIS — D61810 Antineoplastic chemotherapy induced pancytopenia: Secondary | ICD-10-CM | POA: Diagnosis not present

## 2017-12-31 DIAGNOSIS — T451X5A Adverse effect of antineoplastic and immunosuppressive drugs, initial encounter: Secondary | ICD-10-CM | POA: Diagnosis not present

## 2017-12-31 DIAGNOSIS — C9110 Chronic lymphocytic leukemia of B-cell type not having achieved remission: Secondary | ICD-10-CM

## 2017-12-31 LAB — CBC WITH DIFFERENTIAL/PLATELET
Basophils Absolute: 0.1 10*3/uL (ref 0.0–0.1)
Basophils Relative: 1 %
EOS ABS: 0.3 10*3/uL (ref 0.0–0.5)
Eosinophils Relative: 5 %
HEMATOCRIT: 35.7 % (ref 34.8–46.6)
Hemoglobin: 12.1 g/dL (ref 11.6–15.9)
LYMPHS ABS: 0.4 10*3/uL — AB (ref 0.9–3.3)
LYMPHS PCT: 7 %
MCH: 27.6 pg (ref 25.1–34.0)
MCHC: 33.9 g/dL (ref 31.5–36.0)
MCV: 81.4 fL (ref 79.5–101.0)
MONOS PCT: 13 %
Monocytes Absolute: 0.8 10*3/uL (ref 0.1–0.9)
NEUTROS PCT: 74 %
Neutro Abs: 4.5 10*3/uL (ref 1.5–6.5)
Platelets: 153 10*3/uL (ref 145–400)
RBC: 4.39 MIL/uL (ref 3.70–5.45)
RDW: 18.9 % — ABNORMAL HIGH (ref 11.2–14.5)
WBC: 6 10*3/uL (ref 3.9–10.3)

## 2017-12-31 LAB — COMPREHENSIVE METABOLIC PANEL
ALK PHOS: 85 U/L (ref 40–150)
ALT: 30 U/L (ref 0–55)
ANION GAP: 11 (ref 3–11)
AST: 25 U/L (ref 5–34)
Albumin: 3.8 g/dL (ref 3.5–5.0)
BILIRUBIN TOTAL: 0.6 mg/dL (ref 0.2–1.2)
BUN: 16 mg/dL (ref 7–26)
CALCIUM: 9.9 mg/dL (ref 8.4–10.4)
CO2: 25 mmol/L (ref 22–29)
CREATININE: 0.86 mg/dL (ref 0.60–1.10)
Chloride: 107 mmol/L (ref 98–109)
Glucose, Bld: 105 mg/dL (ref 70–140)
Potassium: 4 mmol/L (ref 3.5–5.1)
SODIUM: 143 mmol/L (ref 136–145)
TOTAL PROTEIN: 6.4 g/dL (ref 6.4–8.3)

## 2017-12-31 NOTE — Assessment & Plan Note (Signed)
I have reviewed CBC with the patient Her CBC has returned back to normal She is at peace with the plan of discontinuation of chemotherapy I recommend discontinuation of acyclovir once her current prescription runs out I will get port to be removed I plan to see her back in 3 months for further history, physical examination and blood work.

## 2017-12-31 NOTE — Progress Notes (Signed)
[place] OFFICE PROGRESS NOTE  Patient Care Team: Cari Caraway, MD as PCP - General (Family Medicine) Irine Seal, MD as Attending Physician (Urology) Jacolyn Reedy, MD as Attending Physician (Cardiology) Heath Lark, MD as Consulting Physician (Hematology and Oncology)  ASSESSMENT & PLAN:  CLL (chronic lymphocytic leukemia) I have reviewed CBC with the patient Her CBC has returned back to normal She is at peace with the plan of discontinuation of chemotherapy I recommend discontinuation of acyclovir once her current prescription runs out I will get port to be removed I plan to see her back in 3 months for further history, physical examination and blood work.   Orders Placed This Encounter  Procedures  . IR Removal Tun Access W/ Port W/O FL    Standing Status:   Future    Standing Expiration Date:   03/01/2019    Order Specific Question:   Reason for exam:    Answer:   no need port    Order Specific Question:   Preferred Imaging Location?    Answer:   [facility]  . CBC with Differential/Platelet    Standing Status:   Standing    Number of Occurrences:   9    Standing Expiration Date:   12/31/2018    INTERVAL HISTORY: Please see below for problem oriented charting. She returns for further follow-up She denies recent infection She has excellent energy level No recent nausea  SUMMARY OF ONCOLOGIC HISTORY: Oncology History   Del 13q     CLL (chronic lymphocytic leukemia) ([place])   02/09/2013 Pathology Results    Peripheral Blood Flow Cytometry - FINDINGS CONSISTENT WITH CHRONIC LYMPHOCYTIC LEUKEMIA      09/02/2013 Pathology Results    FISH positive for deletion 13 q      05/22/2017 Imaging    Worsening mesenteric and retroperitoneal adenopathy concerning for worsening lymphoproliferative disorder/lymphoma.  Small cysts in the liver and kidneys are stable.  Prior cholecystectomy.  Aortic atherosclerosis.  Left colonic  diverticulosis without active diverticulitis.      08/11/2017 Imaging    Chest Impression:  1. Mild mediastinal and hilar lymphadenopathy. 2. Clusters small axial lymph nodes.  Abdomen / Pelvis Impression:  1. Mild periaortic retroperitoneal adenopathy and moderate central mesenteric adenopathy not changed comparison CT. 2. Mild iliac adenopathy 3. Normal volume spleen. 4. No explanation for RIGHT lower quadrant pain. Cluster of lymph nodes in the ileocecal mesenteries similar to prior.      08/12/2017 Procedure    Placement of single lumen port a cath via right internal jugular vein. The catheter tip lies at the cavo-atrial junction. A power injectable port a cath was placed and is ready for immediate use.      08/14/2017 -  Chemotherapy    She received Bendamustine and Rituxan      11/12/2017 Imaging    1. Continued decrease in mediastinal and hilar lymph nodes with interval resolution of the abdominal and pelvic lymphadenopathy seen on the prior study. There is no lymphadenopathy in the chest, abdomen, or pelvis by CT size criteria on today's study. No new or progressive interval findings. 2. Aortic Atherosclerois (ICD10-170.0) 3. Hepatic and renal cysts, stable.      12/18/2017 Adverse Reaction    We are unable to resume cycle 5 of chemotherapy due to prolonged pancytopenia       REVIEW OF SYSTEMS:   Constitutional: Denies fevers, chills or abnormal weight loss Eyes: Denies blurriness of vision Ears, nose, mouth, throat, and  face: Denies mucositis or sore throat Respiratory: Denies cough, dyspnea or wheezes Cardiovascular: Denies palpitation, chest discomfort or lower extremity swelling Gastrointestinal:  Denies nausea, heartburn or change in bowel habits Skin: Denies abnormal skin rashes Lymphatics: Denies new lymphadenopathy or easy bruising Neurological:Denies numbness, tingling or new weaknesses Behavioral/Psych: Mood is stable, no new changes  All other systems were  reviewed with the patient and are negative.  I have reviewed the past medical history, past surgical history, social history and family history with the patient and they are unchanged from previous note.  ALLERGIES:  is allergic to codeine; demerol [meperidine]; morphine and related; ciprofloxacin; and sulfa antibiotics.  MEDICATIONS:  Current Outpatient Medications  Medication Sig Dispense Refill  . acetaminophen (TYLENOL) 500 MG tablet Take 500 mg by mouth every 6 (six) hours as needed for pain.    Marland Kitchen aspirin 81 MG tablet Take 81 mg by mouth daily.    Marland Kitchen atorvastatin (LIPITOR) 40 MG tablet Take 40 mg by mouth daily.    . Calcium Carb-Cholecalciferol (CALCIUM + D3) 600-200 MG-UNIT TABS Take 1 tablet by mouth 3 (three) times a week.     Marland Kitchen DIGOX 0.125 MG tablet Take 0.125 mg by mouth daily.    . fluticasone (FLONASE) 50 MCG/ACT nasal spray Place 2 sprays into the nose daily.    . folic acid (FOLVITE) 1 MG tablet Take 1 mg by mouth daily.    . hydrochlorothiazide (HYDRODIURIL) 12.5 MG tablet Take 12.5 mg by mouth daily.    Marland Kitchen ketotifen (ZADITOR) 0.025 % ophthalmic solution Apply 1 drop to eye 2 (two) times daily.    . metoprolol (LOPRESSOR) 50 MG tablet Take 50 mg by mouth 2 (two) times daily.    . Multiple Vitamin (MULTIVITAMIN) tablet Take 1 tablet by mouth daily.    . Multiple Vitamins-Minerals (EYE VITAMINS & MINERALS) TABS Take by mouth.    . potassium chloride SA (K-DUR,KLOR-CON) 20 MEQ tablet Take 20 mEq by mouth 3 (three) times a week. Mon, Wed, Fri     No current facility-administered medications for this visit.     PHYSICAL EXAMINATION: ECOG PERFORMANCE STATUS: 0 - Asymptomatic  Vitals:   12/31/17 1028  BP: 139/60  Pulse: 74  Resp: 20  Temp: 98.4 F (36.9 C)  SpO2: 99%   Filed Weights   12/31/17 1028  Weight: 139 lb 4.8 oz (63.2 kg)    GENERAL:alert, no distress and comfortable SKIN: skin color, texture, turgor are normal, no rashes or significant  lesions Musculoskeletal:no cyanosis of digits and no clubbing  NEURO: alert & oriented x 3 with fluent speech, no focal motor/sensory deficits  LABORATORY DATA:  I have reviewed the data as listed    Component Value Date/Time   NA 143 12/31/2017 1008   NA 141 11/13/2017 0753   K 4.0 12/31/2017 1008   K 4.1 11/13/2017 0753   CL 107 12/31/2017 1008   CL 104 02/08/2013 1454   CO2 25 12/31/2017 1008   CO2 27 11/13/2017 0753   GLUCOSE 105 12/31/2017 1008   GLUCOSE 113 11/13/2017 0753   GLUCOSE 103 (H) 02/08/2013 1454   BUN 16 12/31/2017 1008   BUN 11.6 11/13/2017 0753   CREATININE 0.86 12/31/2017 1008   CREATININE 0.8 11/13/2017 0753   CALCIUM 9.9 12/31/2017 1008   CALCIUM 9.3 11/13/2017 0753   PROT 6.4 12/31/2017 1008   PROT 6.2 (L) 11/13/2017 0753   ALBUMIN 3.8 12/31/2017 1008   ALBUMIN 3.3 (L) 11/13/2017 0753   AST 25  12/31/2017 1008   AST 24 11/13/2017 0753   ALT 30 12/31/2017 1008   ALT 24 11/13/2017 0753   ALKPHOS 85 12/31/2017 1008   ALKPHOS 83 11/13/2017 0753   BILITOT 0.6 12/31/2017 1008   BILITOT 0.38 11/13/2017 0753   GFRNONAA >60 12/31/2017 1008   GFRAA >60 12/31/2017 1008    No results found for: SPEP, UPEP  Lab Results  Component Value Date   WBC 6.0 12/31/2017   NEUTROABS 4.5 12/31/2017   HGB 12.1 12/31/2017   HCT 35.7 12/31/2017   MCV 81.4 12/31/2017   PLT 153 12/31/2017      Chemistry      Component Value Date/Time   NA 143 12/31/2017 1008   NA 141 11/13/2017 0753   K 4.0 12/31/2017 1008   K 4.1 11/13/2017 0753   CL 107 12/31/2017 1008   CL 104 02/08/2013 1454   CO2 25 12/31/2017 1008   CO2 27 11/13/2017 0753   BUN 16 12/31/2017 1008   BUN 11.6 11/13/2017 0753   CREATININE 0.86 12/31/2017 1008   CREATININE 0.8 11/13/2017 0753      Component Value Date/Time   CALCIUM 9.9 12/31/2017 1008   CALCIUM 9.3 11/13/2017 0753   ALKPHOS 85 12/31/2017 1008   ALKPHOS 83 11/13/2017 0753   AST 25 12/31/2017 1008   AST 24 11/13/2017 0753   ALT 30  12/31/2017 1008   ALT 24 11/13/2017 0753   BILITOT 0.6 12/31/2017 1008   BILITOT 0.38 11/13/2017 0753       All questions were answered. The patient knows to call the clinic with any problems, questions or concerns. No barriers to learning was detected.  I spent 10 minutes counseling the patient face to face. The total time spent in the appointment was 15 minutes and more than 50% was on counseling and review of test results  Heath Lark, MD 12/31/2017 11:59 AM

## 2018-01-05 ENCOUNTER — Other Ambulatory Visit: Payer: Self-pay | Admitting: Family Medicine

## 2018-01-05 ENCOUNTER — Ambulatory Visit
Admission: RE | Admit: 2018-01-05 | Discharge: 2018-01-05 | Disposition: A | Discharge status: Home / Self Care | Payer: Medicare Other | Source: Ambulatory Visit | Attending: Family Medicine | Admitting: Family Medicine

## 2018-01-05 DIAGNOSIS — N6489 Other specified disorders of breast: Secondary | ICD-10-CM | POA: Diagnosis not present

## 2018-01-05 DIAGNOSIS — N6310 Unspecified lump in the right breast, unspecified quadrant: Secondary | ICD-10-CM

## 2018-01-05 DIAGNOSIS — R928 Other abnormal and inconclusive findings on diagnostic imaging of breast: Secondary | ICD-10-CM

## 2018-01-27 ENCOUNTER — Other Ambulatory Visit: Payer: Self-pay | Admitting: Radiology

## 2018-01-29 ENCOUNTER — Ambulatory Visit (HOSPITAL_COMMUNITY)
Admission: RE | Admit: 2018-01-29 | Discharge: 2018-01-29 | Disposition: A | Discharge status: Home / Self Care | Payer: Medicare Other | Source: Ambulatory Visit | Attending: Hematology and Oncology | Admitting: Hematology and Oncology

## 2018-01-29 ENCOUNTER — Encounter (HOSPITAL_COMMUNITY): Payer: Self-pay

## 2018-01-29 DIAGNOSIS — Z452 Encounter for adjustment and management of vascular access device: Secondary | ICD-10-CM | POA: Insufficient documentation

## 2018-01-29 DIAGNOSIS — C9110 Chronic lymphocytic leukemia of B-cell type not having achieved remission: Secondary | ICD-10-CM | POA: Diagnosis not present

## 2018-01-29 HISTORY — PX: IR REMOVAL TUN ACCESS W/ PORT W/O FL MOD SED: IMG2290

## 2018-01-29 LAB — CBC WITH DIFFERENTIAL/PLATELET
Basophils Absolute: 0.1 10*3/uL (ref 0.0–0.1)
Basophils Relative: 1 %
EOS PCT: 4 %
Eosinophils Absolute: 0.3 10*3/uL (ref 0.0–0.7)
HEMATOCRIT: 37.6 % (ref 36.0–46.0)
Hemoglobin: 12.9 g/dL (ref 12.0–15.0)
LYMPHS PCT: 11 %
Lymphs Abs: 0.7 10*3/uL (ref 0.7–4.0)
MCH: 28.9 pg (ref 26.0–34.0)
MCHC: 34.3 g/dL (ref 30.0–36.0)
MCV: 84.1 fL (ref 78.0–100.0)
MONO ABS: 1 10*3/uL (ref 0.1–1.0)
MONOS PCT: 15 %
NEUTROS ABS: 4.8 10*3/uL (ref 1.7–7.7)
Neutrophils Relative %: 69 %
PLATELETS: 166 10*3/uL (ref 150–400)
RBC: 4.47 MIL/uL (ref 3.87–5.11)
RDW: 15 % (ref 11.5–15.5)
WBC: 6.9 10*3/uL (ref 4.0–10.5)

## 2018-01-29 LAB — PROTIME-INR
INR: 0.93
Prothrombin Time: 12.4 seconds (ref 11.4–15.2)

## 2018-01-29 MED ORDER — CEFAZOLIN SODIUM-DEXTROSE 2-4 GM/100ML-% IV SOLN
2.0000 g | INTRAVENOUS | Status: AC
  Administered 2018-01-29: 2 g via INTRAVENOUS

## 2018-01-29 MED ORDER — LIDOCAINE HCL (PF) 1 % IJ SOLN
INTRAMUSCULAR | Status: AC
  Filled 2018-01-29: qty 30

## 2018-01-29 MED ORDER — LIDOCAINE HCL 1 % IJ SOLN
INTRAMUSCULAR | Status: DC | PRN
  Administered 2018-01-29: 10 mL

## 2018-01-29 MED ORDER — CEFAZOLIN SODIUM-DEXTROSE 2-4 GM/100ML-% IV SOLN
INTRAVENOUS | Status: AC
  Administered 2018-01-29: 2 g via INTRAVENOUS
  Filled 2018-01-29: qty 100

## 2018-01-29 MED ORDER — SODIUM CHLORIDE 0.9 % IV SOLN
INTRAVENOUS | Status: DC
  Administered 2018-01-29: 13:00:00 via INTRAVENOUS

## 2018-01-29 NOTE — Progress Notes (Signed)
Patient presents today for Port-A-Cath removal.  Details/risks of procedure, including but not limited to, internal bleeding, infection, injury to adjacent structures, discussed with patient and sister with their understanding and consent.  Patient does not wish to have IV conscious sedation for procedure.

## 2018-01-29 NOTE — Discharge Instructions (Signed)
Implanted Port Removal, Care After °Refer to this sheet in the next few weeks. These instructions provide you with information about caring for yourself after your procedure. Your health care provider may also give you more specific instructions. Your treatment has been planned according to current medical practices, but problems sometimes occur. Call your health care provider if you have any problems or questions after your procedure. °What can I expect after the procedure? °After the procedure, it is common to have: °· Soreness or pain near your incision. °· Some swelling or bruising near your incision. ° °Follow these instructions at home: °Medicines °· Take over-the-counter and prescription medicines only as told by your health care provider. °· If you were prescribed an antibiotic medicine, take it as told by your health care provider. Do not stop taking the antibiotic even if you start to feel better. °Bathing °· Do not take baths, swim, or use a hot tub until your health care provider approves. Ask your health care provider if you can take showers. You may only be allowed to take sponge baths for bathing. °Incision care °· Follow instructions from your health care provider about how to take care of your incision. Make sure you: °? Wash your hands with soap and water before you change your bandage (dressing). If soap and water are not available, use hand sanitizer. °? Change your dressing as told by your health care provider. °? Keep your dressing dry. °? Leave stitches (sutures), skin glue, or adhesive strips in place. These skin closures may need to stay in place for 2 weeks or longer. If adhesive strip edges start to loosen and curl up, you may trim the loose edges. Do not remove adhesive strips completely unless your health care provider tells you to do that. °· Check your incision area every day for signs of infection. Check for: °? More redness, swelling, or pain. °? More fluid or  blood. °? Warmth. °? Pus or a bad smell. °Driving °· If you received a sedative, do not drive for 24 hours after the procedure. °· If you did not receive a sedative, ask your health care provider when it is safe to drive. °Activity °· Return to your normal activities as told by your health care provider. Ask your health care provider what activities are safe for you. °· Until your health care provider says it is safe: °? Do not lift anything that is heavier than 10 lb (4.5 kg). °? Do not do activities that involve lifting your arms over your head. °General instructions °· Do not use any tobacco products, such as cigarettes, chewing tobacco, and e-cigarettes. Tobacco can delay healing. If you need help quitting, ask your health care provider. °· Keep all follow-up visits as told by your health care provider. This is important. °Contact a health care provider if: °· You have more redness, swelling, or pain around your incision. °· You have more fluid or blood coming from your incision. °· Your incision feels warm to the touch. °· You have pus or a bad smell coming from your incision. °· You have a fever. °· You have pain that is not relieved by your pain medicine. °Get help right away if: °· You have chest pain. °· You have difficulty breathing. °This information is not intended to replace advice given to you by your health care provider. Make sure you discuss any questions you have with your health care provider. °Document Released: 10/09/2015 Document Revised: 04/04/2016 Document Reviewed: 08/02/2015 °Elsevier Interactive Patient   Education © 2018 [employer] ° °

## 2018-01-29 NOTE — Procedures (Signed)
Interventional Radiology Procedure Note  Procedure: Right chest portacatheter explantation  Complications: None  Estimated Blood Loss: None  Recommendations: None  Signed,  Criselda Peaches, MD

## 2018-03-05 DIAGNOSIS — L570 Actinic keratosis: Secondary | ICD-10-CM | POA: Diagnosis not present

## 2018-03-05 DIAGNOSIS — D485 Neoplasm of uncertain behavior of skin: Secondary | ICD-10-CM | POA: Diagnosis not present

## 2018-03-05 DIAGNOSIS — Z85828 Personal history of other malignant neoplasm of skin: Secondary | ICD-10-CM | POA: Diagnosis not present

## 2018-03-05 DIAGNOSIS — L821 Other seborrheic keratosis: Secondary | ICD-10-CM | POA: Diagnosis not present

## 2018-03-05 DIAGNOSIS — D225 Melanocytic nevi of trunk: Secondary | ICD-10-CM | POA: Diagnosis not present

## 2018-03-05 DIAGNOSIS — D0461 Carcinoma in situ of skin of right upper limb, including shoulder: Secondary | ICD-10-CM | POA: Diagnosis not present

## 2018-03-06 DIAGNOSIS — D0461 Carcinoma in situ of skin of right upper limb, including shoulder: Secondary | ICD-10-CM | POA: Diagnosis not present

## 2018-03-06 DIAGNOSIS — D485 Neoplasm of uncertain behavior of skin: Secondary | ICD-10-CM | POA: Diagnosis not present

## 2018-03-17 DIAGNOSIS — H01022 Squamous blepharitis right lower eyelid: Secondary | ICD-10-CM | POA: Diagnosis not present

## 2018-03-17 DIAGNOSIS — H01025 Squamous blepharitis left lower eyelid: Secondary | ICD-10-CM | POA: Diagnosis not present

## 2018-03-17 DIAGNOSIS — H353131 Nonexudative age-related macular degeneration, bilateral, early dry stage: Secondary | ICD-10-CM | POA: Diagnosis not present

## 2018-03-17 DIAGNOSIS — H01024 Squamous blepharitis left upper eyelid: Secondary | ICD-10-CM | POA: Diagnosis not present

## 2018-03-17 DIAGNOSIS — H2513 Age-related nuclear cataract, bilateral: Secondary | ICD-10-CM | POA: Diagnosis not present

## 2018-03-17 DIAGNOSIS — H01021 Squamous blepharitis right upper eyelid: Secondary | ICD-10-CM | POA: Diagnosis not present

## 2018-03-17 DIAGNOSIS — H43813 Vitreous degeneration, bilateral: Secondary | ICD-10-CM | POA: Diagnosis not present

## 2018-03-25 DIAGNOSIS — D0461 Carcinoma in situ of skin of right upper limb, including shoulder: Secondary | ICD-10-CM | POA: Diagnosis not present

## 2018-03-25 DIAGNOSIS — E782 Mixed hyperlipidemia: Secondary | ICD-10-CM | POA: Diagnosis not present

## 2018-03-25 DIAGNOSIS — Z23 Encounter for immunization: Secondary | ICD-10-CM | POA: Diagnosis not present

## 2018-03-25 DIAGNOSIS — C9191 Lymphoid leukemia, unspecified, in remission: Secondary | ICD-10-CM | POA: Diagnosis not present

## 2018-03-25 DIAGNOSIS — C4492 Squamous cell carcinoma of skin, unspecified: Secondary | ICD-10-CM | POA: Diagnosis not present

## 2018-03-25 DIAGNOSIS — I10 Essential (primary) hypertension: Secondary | ICD-10-CM | POA: Diagnosis not present

## 2018-03-25 DIAGNOSIS — J309 Allergic rhinitis, unspecified: Secondary | ICD-10-CM | POA: Diagnosis not present

## 2018-03-25 DIAGNOSIS — M85852 Other specified disorders of bone density and structure, left thigh: Secondary | ICD-10-CM | POA: Diagnosis not present

## 2018-03-25 DIAGNOSIS — I480 Paroxysmal atrial fibrillation: Secondary | ICD-10-CM | POA: Diagnosis not present

## 2018-03-30 ENCOUNTER — Inpatient Hospital Stay: Payer: Medicare Other | Attending: Hematology and Oncology | Admitting: Hematology and Oncology

## 2018-03-30 ENCOUNTER — Encounter: Payer: Self-pay | Admitting: Hematology and Oncology

## 2018-03-30 ENCOUNTER — Inpatient Hospital Stay: Payer: Medicare Other

## 2018-03-30 ENCOUNTER — Telehealth: Payer: Self-pay | Admitting: Hematology and Oncology

## 2018-03-30 DIAGNOSIS — C9110 Chronic lymphocytic leukemia of B-cell type not having achieved remission: Secondary | ICD-10-CM | POA: Insufficient documentation

## 2018-03-30 DIAGNOSIS — Z85828 Personal history of other malignant neoplasm of skin: Secondary | ICD-10-CM | POA: Insufficient documentation

## 2018-03-30 DIAGNOSIS — C44622 Squamous cell carcinoma of skin of right upper limb, including shoulder: Secondary | ICD-10-CM | POA: Insufficient documentation

## 2018-03-30 LAB — CBC WITH DIFFERENTIAL/PLATELET
BASOS PCT: 3 %
Basophils Absolute: 0.1 10*3/uL (ref 0.0–0.1)
Eosinophils Absolute: 0.2 10*3/uL (ref 0.0–0.5)
Eosinophils Relative: 8 %
HEMATOCRIT: 35.9 % (ref 34.8–46.6)
Hemoglobin: 12.4 g/dL (ref 11.6–15.9)
LYMPHS PCT: 28 %
Lymphs Abs: 0.7 10*3/uL — ABNORMAL LOW (ref 0.9–3.3)
MCH: 29.5 pg (ref 25.1–34.0)
MCHC: 34.5 g/dL (ref 31.5–36.0)
MCV: 85.5 fL (ref 79.5–101.0)
MONO ABS: 0.3 10*3/uL (ref 0.1–0.9)
MONOS PCT: 13 %
NEUTROS ABS: 1.1 10*3/uL — AB (ref 1.5–6.5)
Neutrophils Relative %: 48 %
Platelets: 119 10*3/uL — ABNORMAL LOW (ref 145–400)
RBC: 4.2 MIL/uL (ref 3.70–5.45)
RDW: 14.5 % (ref 11.2–14.5)
WBC: 2.4 10*3/uL — ABNORMAL LOW (ref 3.9–10.3)

## 2018-03-30 NOTE — Assessment & Plan Note (Signed)
She had recent skin cancer removal from the right forearm I recommend close follow-up with dermatologist We discussed risk of skin cancer with diagnosis of CLL and prior lymphoma treatment

## 2018-03-30 NOTE — Telephone Encounter (Signed)
Gave avs and calendar ° °

## 2018-03-30 NOTE — Progress Notes (Signed)
[place] OFFICE PROGRESS NOTE  Patient Care Team: Cari Caraway, MD as PCP - General (Family Medicine) Irine Seal, MD as Attending Physician (Urology) Jacolyn Reedy, MD as Attending Physician (Cardiology) Heath Lark, MD as Consulting Physician (Hematology and Oncology)  ASSESSMENT & PLAN:  CLL (chronic lymphocytic leukemia) Clinically, she has no palpable lymphadenopathy or symptoms to suggest cancer relapse She has mild pancytopenia of unknown etiology I recommend repeat blood count in the month If she has persistent pancytopenia, I might have to repeat imaging study to exclude recurrence She agreed  Squamous cell cancer of skin of forearm, right She had recent skin cancer removal from the right forearm I recommend close follow-up with dermatologist We discussed risk of skin cancer with diagnosis of CLL and prior lymphoma treatment   No orders of the defined types were placed in this encounter.   INTERVAL HISTORY: Please see below for problem oriented charting. She returns for further follow-up She had recent skin cancer removal approximately 5 days ago Her wound is healing well She denies recent lymphadenopathy or infection She has gained some weight  SUMMARY OF ONCOLOGIC HISTORY: Oncology History   Del 13q     CLL (chronic lymphocytic leukemia) ([place])   02/09/2013 Pathology Results    Peripheral Blood Flow Cytometry - FINDINGS CONSISTENT WITH CHRONIC LYMPHOCYTIC LEUKEMIA      09/02/2013 Pathology Results    FISH positive for deletion 13 q      05/22/2017 Imaging    Worsening mesenteric and retroperitoneal adenopathy concerning for worsening lymphoproliferative disorder/lymphoma.  Small cysts in the liver and kidneys are stable.  Prior cholecystectomy.  Aortic atherosclerosis.  Left colonic diverticulosis without active diverticulitis.      08/11/2017 Imaging    Chest Impression:  1. Mild mediastinal and hilar  lymphadenopathy. 2. Clusters small axial lymph nodes.  Abdomen / Pelvis Impression:  1. Mild periaortic retroperitoneal adenopathy and moderate central mesenteric adenopathy not changed comparison CT. 2. Mild iliac adenopathy 3. Normal volume spleen. 4. No explanation for RIGHT lower quadrant pain. Cluster of lymph nodes in the ileocecal mesenteries similar to prior.      08/12/2017 Procedure    Placement of single lumen port a cath via right internal jugular vein. The catheter tip lies at the cavo-atrial junction. A power injectable port a cath was placed and is ready for immediate use.      08/14/2017 - 11/14/2017 Chemotherapy    She received Bendamustine and Rituxan      11/12/2017 Imaging    1. Continued decrease in mediastinal and hilar lymph nodes with interval resolution of the abdominal and pelvic lymphadenopathy seen on the prior study. There is no lymphadenopathy in the chest, abdomen, or pelvis by CT size criteria on today's study. No new or progressive interval findings. 2. Aortic Atherosclerois (ICD10-170.0) 3. Hepatic and renal cysts, stable.      12/18/2017 Adverse Reaction    We are unable to resume cycle 5 of chemotherapy due to prolonged pancytopenia      01/29/2018 Procedure    Successful right IJ vein Port-A-Cath explant.       REVIEW OF SYSTEMS:   Constitutional: Denies fevers, chills or abnormal weight loss Eyes: Denies blurriness of vision Ears, nose, mouth, throat, and face: Denies mucositis or sore throat Respiratory: Denies cough, dyspnea or wheezes Cardiovascular: Denies palpitation, chest discomfort or lower extremity swelling Gastrointestinal:  Denies nausea, heartburn or change in bowel habits Lymphatics: Denies new lymphadenopathy or easy bruising Neurological:Denies numbness, tingling  or new weaknesses Behavioral/Psych: Mood is stable, no new changes  All other systems were reviewed with the patient and are negative.  I have reviewed the past  medical history, past surgical history, social history and family history with the patient and they are unchanged from previous note.  ALLERGIES:  is allergic to codeine; demerol [meperidine]; morphine and related; ciprofloxacin; and sulfa antibiotics.  MEDICATIONS:  Current Outpatient Medications  Medication Sig Dispense Refill  . acetaminophen (TYLENOL) 500 MG tablet Take 500 mg by mouth every 6 (six) hours as needed for pain.    Marland Kitchen aspirin 81 MG tablet Take 81 mg by mouth daily.    Marland Kitchen atorvastatin (LIPITOR) 40 MG tablet Take 40 mg by mouth daily.    . Calcium Carb-Cholecalciferol (CALCIUM + D3) 600-200 MG-UNIT TABS Take 1 tablet by mouth 3 (three) times a week.     Marland Kitchen DIGOX 0.125 MG tablet Take 0.125 mg by mouth daily.    . fluticasone (FLONASE) 50 MCG/ACT nasal spray Place 2 sprays into the nose daily.    . folic acid (FOLVITE) 1 MG tablet Take 1 mg by mouth daily.    . hydrochlorothiazide (HYDRODIURIL) 12.5 MG tablet Take 12.5 mg by mouth daily.    . metoprolol (LOPRESSOR) 50 MG tablet Take 50 mg by mouth 2 (two) times daily.    . Multiple Vitamin (MULTIVITAMIN) tablet Take 1 tablet by mouth daily.    . Multiple Vitamins-Minerals (EYE VITAMINS & MINERALS) TABS Take by mouth.    . potassium chloride SA (K-DUR,KLOR-CON) 20 MEQ tablet Take 20 mEq by mouth 3 (three) times a week. Mon, Wed, Fri     No current facility-administered medications for this visit.     PHYSICAL EXAMINATION: ECOG PERFORMANCE STATUS: 1 - Symptomatic but completely ambulatory  Vitals:   03/30/18 0922  BP: 127/62  Pulse: 68  Resp: 18  Temp: 98.7 F (37.1 C)  SpO2: 100%   Filed Weights   03/30/18 0922  Weight: 143 lb 8 oz (65.1 kg)    GENERAL:alert, no distress and comfortable SKIN: skin color, texture, turgor are normal, no rashes or significant lesions EYES: normal, Conjunctiva are pink and non-injected, sclera clear OROPHARYNX:no exudate, no erythema and lips, buccal mucosa, and tongue normal  NECK:  supple, thyroid normal size, non-tender, without nodularity LYMPH:  no palpable lymphadenopathy in the cervical, axillary or inguinal LUNGS: clear to auscultation and percussion with normal breathing effort HEART: regular rate & rhythm and no murmurs and no lower extremity edema ABDOMEN:abdomen soft, non-tender and normal bowel sounds Musculoskeletal:no cyanosis of digits and no clubbing  NEURO: alert & oriented x 3 with fluent speech, no focal motor/sensory deficits  LABORATORY DATA:  I have reviewed the data as listed    Component Value Date/Time   NA 143 12/31/2017 1008   NA 141 11/13/2017 0753   K 4.0 12/31/2017 1008   K 4.1 11/13/2017 0753   CL 107 12/31/2017 1008   CL 104 02/08/2013 1454   CO2 25 12/31/2017 1008   CO2 27 11/13/2017 0753   GLUCOSE 105 12/31/2017 1008   GLUCOSE 113 11/13/2017 0753   GLUCOSE 103 (H) 02/08/2013 1454   BUN 16 12/31/2017 1008   BUN 11.6 11/13/2017 0753   CREATININE 0.86 12/31/2017 1008   CREATININE 0.8 11/13/2017 0753   CALCIUM 9.9 12/31/2017 1008   CALCIUM 9.3 11/13/2017 0753   PROT 6.4 12/31/2017 1008   PROT 6.2 (L) 11/13/2017 0753   ALBUMIN 3.8 12/31/2017 1008   ALBUMIN  3.3 (L) 11/13/2017 0753   AST 25 12/31/2017 1008   AST 24 11/13/2017 0753   ALT 30 12/31/2017 1008   ALT 24 11/13/2017 0753   ALKPHOS 85 12/31/2017 1008   ALKPHOS 83 11/13/2017 0753   BILITOT 0.6 12/31/2017 1008   BILITOT 0.38 11/13/2017 0753   GFRNONAA >60 12/31/2017 1008   GFRAA >60 12/31/2017 1008    No results found for: SPEP, UPEP  Lab Results  Component Value Date   WBC 2.4 (L) 03/30/2018   NEUTROABS 1.1 (L) 03/30/2018   HGB 12.4 03/30/2018   HCT 35.9 03/30/2018   MCV 85.5 03/30/2018   PLT 119 (L) 03/30/2018      Chemistry      Component Value Date/Time   NA 143 12/31/2017 1008   NA 141 11/13/2017 0753   K 4.0 12/31/2017 1008   K 4.1 11/13/2017 0753   CL 107 12/31/2017 1008   CL 104 02/08/2013 1454   CO2 25 12/31/2017 1008   CO2 27 11/13/2017  0753   BUN 16 12/31/2017 1008   BUN 11.6 11/13/2017 0753   CREATININE 0.86 12/31/2017 1008   CREATININE 0.8 11/13/2017 0753      Component Value Date/Time   CALCIUM 9.9 12/31/2017 1008   CALCIUM 9.3 11/13/2017 0753   ALKPHOS 85 12/31/2017 1008   ALKPHOS 83 11/13/2017 0753   AST 25 12/31/2017 1008   AST 24 11/13/2017 0753   ALT 30 12/31/2017 1008   ALT 24 11/13/2017 0753   BILITOT 0.6 12/31/2017 1008   BILITOT 0.38 11/13/2017 0753      All questions were answered. The patient knows to call the clinic with any problems, questions or concerns. No barriers to learning was detected.  I spent 10 minutes counseling the patient face to face. The total time spent in the appointment was 15 minutes and more than 50% was on counseling and review of test results  Heath Lark, MD 03/30/2018 3:06 PM

## 2018-03-30 NOTE — Assessment & Plan Note (Signed)
Clinically, she has no palpable lymphadenopathy or symptoms to suggest cancer relapse She has mild pancytopenia of unknown etiology I recommend repeat blood count in the month If she has persistent pancytopenia, I might have to repeat imaging study to exclude recurrence She agreed

## 2018-04-20 DIAGNOSIS — M8588 Other specified disorders of bone density and structure, other site: Secondary | ICD-10-CM | POA: Diagnosis not present

## 2018-04-24 ENCOUNTER — Telehealth: Payer: Self-pay | Admitting: Hematology and Oncology

## 2018-04-24 NOTE — Telephone Encounter (Signed)
Spoke to patient regarding upcoming June appointment updates per 6/14 sch message

## 2018-04-28 ENCOUNTER — Inpatient Hospital Stay: Payer: Medicare Other

## 2018-04-28 ENCOUNTER — Telehealth: Payer: Self-pay | Admitting: Hematology and Oncology

## 2018-04-28 ENCOUNTER — Encounter: Payer: Self-pay | Admitting: Hematology and Oncology

## 2018-04-28 ENCOUNTER — Inpatient Hospital Stay: Payer: Medicare Other | Attending: Hematology and Oncology | Admitting: Hematology and Oncology

## 2018-04-28 DIAGNOSIS — I952 Hypotension due to drugs: Secondary | ICD-10-CM

## 2018-04-28 DIAGNOSIS — C9110 Chronic lymphocytic leukemia of B-cell type not having achieved remission: Secondary | ICD-10-CM

## 2018-04-28 DIAGNOSIS — R21 Rash and other nonspecific skin eruption: Secondary | ICD-10-CM | POA: Diagnosis not present

## 2018-04-28 DIAGNOSIS — T50905A Adverse effect of unspecified drugs, medicaments and biological substances, initial encounter: Secondary | ICD-10-CM | POA: Insufficient documentation

## 2018-04-28 LAB — CBC WITH DIFFERENTIAL/PLATELET
BASOS ABS: 0.1 10*3/uL (ref 0.0–0.1)
BASOS PCT: 2 %
EOS PCT: 4 %
Eosinophils Absolute: 0.2 10*3/uL (ref 0.0–0.5)
HCT: 36.3 % (ref 34.8–46.6)
Hemoglobin: 12.5 g/dL (ref 11.6–15.9)
LYMPHS PCT: 16 %
Lymphs Abs: 0.7 10*3/uL — ABNORMAL LOW (ref 0.9–3.3)
MCH: 29.3 pg (ref 25.1–34.0)
MCHC: 34.4 g/dL (ref 31.5–36.0)
MCV: 85.2 fL (ref 79.5–101.0)
Monocytes Absolute: 0.8 10*3/uL (ref 0.1–0.9)
Monocytes Relative: 18 %
NEUTROS ABS: 2.6 10*3/uL (ref 1.5–6.5)
Neutrophils Relative %: 60 %
PLATELETS: 158 10*3/uL (ref 145–400)
RBC: 4.26 MIL/uL (ref 3.70–5.45)
RDW: 14.6 % — AB (ref 11.2–14.5)
WBC: 4.4 10*3/uL (ref 3.9–10.3)

## 2018-04-28 NOTE — Assessment & Plan Note (Signed)
The skin rash is healing well We discussed aggressive skin protection and avoidance of excessive sun exposure

## 2018-04-28 NOTE — Assessment & Plan Note (Signed)
She has mild hypotension but not symptomatic We discussed close blood pressure monitoring and medication adjustment as needed

## 2018-04-28 NOTE — Assessment & Plan Note (Signed)
Thankfully, repeat CBC showed resolution of previous abnormalities I plan to see her back again in 3 months with history, physical examination and blood work

## 2018-04-28 NOTE — Progress Notes (Signed)
[place] OFFICE PROGRESS NOTE  Patient Care Team: Cari Caraway, MD as PCP - General (Family Medicine) Irine Seal, MD as Attending Physician (Urology) Jacolyn Reedy, MD as Attending Physician (Cardiology) Heath Lark, MD as Consulting Physician (Hematology and Oncology)  ASSESSMENT & PLAN:  CLL (chronic lymphocytic leukemia) Thankfully, repeat CBC showed resolution of previous abnormalities I plan to see her back again in 3 months with history, physical examination and blood work  Skin rash The skin rash is healing well We discussed aggressive skin protection and avoidance of excessive sun exposure  Drug-induced hypotension She has mild hypotension but not symptomatic We discussed close blood pressure monitoring and medication adjustment as needed   No orders of the defined types were placed in this encounter.   INTERVAL HISTORY: Please see below for problem oriented charting. She returns for further follow-up due to abnormal CBC a month ago Since her last time I saw her, she feels well Denies recent infection She has some mild chronic sinus congestion but no fever Denies recent cough Her skin is healing well from recent cryotherapy from dermatologist No new lymphadenopathy  SUMMARY OF ONCOLOGIC HISTORY: Oncology History   Del 13q     CLL (chronic lymphocytic leukemia) ([place])   02/09/2013 Pathology Results    Peripheral Blood Flow Cytometry - FINDINGS CONSISTENT WITH CHRONIC LYMPHOCYTIC LEUKEMIA      09/02/2013 Pathology Results    FISH positive for deletion 13 q      05/22/2017 Imaging    Worsening mesenteric and retroperitoneal adenopathy concerning for worsening lymphoproliferative disorder/lymphoma.  Small cysts in the liver and kidneys are stable.  Prior cholecystectomy.  Aortic atherosclerosis.  Left colonic diverticulosis without active diverticulitis.      08/11/2017 Imaging    Chest Impression:  1. Mild mediastinal and  hilar lymphadenopathy. 2. Clusters small axial lymph nodes.  Abdomen / Pelvis Impression:  1. Mild periaortic retroperitoneal adenopathy and moderate central mesenteric adenopathy not changed comparison CT. 2. Mild iliac adenopathy 3. Normal volume spleen. 4. No explanation for RIGHT lower quadrant pain. Cluster of lymph nodes in the ileocecal mesenteries similar to prior.      08/12/2017 Procedure    Placement of single lumen port a cath via right internal jugular vein. The catheter tip lies at the cavo-atrial junction. A power injectable port a cath was placed and is ready for immediate use.      08/14/2017 - 11/14/2017 Chemotherapy    She received Bendamustine and Rituxan      11/12/2017 Imaging    1. Continued decrease in mediastinal and hilar lymph nodes with interval resolution of the abdominal and pelvic lymphadenopathy seen on the prior study. There is no lymphadenopathy in the chest, abdomen, or pelvis by CT size criteria on today's study. No new or progressive interval findings. 2. Aortic Atherosclerois (ICD10-170.0) 3. Hepatic and renal cysts, stable.      12/18/2017 Adverse Reaction    We are unable to resume cycle 5 of chemotherapy due to prolonged pancytopenia      01/29/2018 Procedure    Successful right IJ vein Port-A-Cath explant.       REVIEW OF SYSTEMS:   Constitutional: Denies fevers, chills or abnormal weight loss Eyes: Denies blurriness of vision Ears, nose, mouth, throat, and face: Denies mucositis or sore throat Respiratory: Denies cough, dyspnea or wheezes Cardiovascular: Denies palpitation, chest discomfort or lower extremity swelling Gastrointestinal:  Denies nausea, heartburn or change in bowel habits Lymphatics: Denies new lymphadenopathy or easy bruising  Neurological:Denies numbness, tingling or new weaknesses Behavioral/Psych: Mood is stable, no new changes  All other systems were reviewed with the patient and are negative.  I have reviewed the  past medical history, past surgical history, social history and family history with the patient and they are unchanged from previous note.  ALLERGIES:  is allergic to codeine; demerol [meperidine]; morphine and related; ciprofloxacin; and sulfa antibiotics.  MEDICATIONS:  Current Outpatient Medications  Medication Sig Dispense Refill  . acetaminophen (TYLENOL) 500 MG tablet Take 500 mg by mouth every 6 (six) hours as needed for pain.    Marland Kitchen aspirin 81 MG tablet Take 81 mg by mouth daily.    Marland Kitchen atorvastatin (LIPITOR) 40 MG tablet Take 40 mg by mouth daily.    . Calcium Carb-Cholecalciferol (CALCIUM + D3) 600-200 MG-UNIT TABS Take 1 tablet by mouth 3 (three) times a week.     Marland Kitchen DIGOX 0.125 MG tablet Take 0.125 mg by mouth daily.    . fluticasone (FLONASE) 50 MCG/ACT nasal spray Place 2 sprays into the nose daily.    . folic acid (FOLVITE) 1 MG tablet Take 1 mg by mouth daily.    . hydrochlorothiazide (HYDRODIURIL) 12.5 MG tablet Take 12.5 mg by mouth daily.    . metoprolol (LOPRESSOR) 50 MG tablet Take 50 mg by mouth 2 (two) times daily.    . Multiple Vitamin (MULTIVITAMIN) tablet Take 1 tablet by mouth daily.    . Multiple Vitamins-Minerals (EYE VITAMINS & MINERALS) TABS Take by mouth.    . potassium chloride SA (K-DUR,KLOR-CON) 20 MEQ tablet Take 20 mEq by mouth 3 (three) times a week. Mon, Wed, Fri     No current facility-administered medications for this visit.     PHYSICAL EXAMINATION: ECOG PERFORMANCE STATUS: 0 - Asymptomatic  Vitals:   04/28/18 1457  BP: (!) 116/49  Pulse: 62  Resp: 18  Temp: 98.4 F (36.9 C)  SpO2: 98%   Filed Weights   04/28/18 1457  Weight: 142 lb 9.6 oz (64.7 kg)    GENERAL:alert, no distress and comfortable SKIN: Her skin is healing well from recent cryotherapy Musculoskeletal:no cyanosis of digits and no clubbing  NEURO: alert & oriented x 3 with fluent speech, no focal motor/sensory deficits  LABORATORY DATA:  I have reviewed the data as  listed    Component Value Date/Time   NA 143 12/31/2017 1008   NA 141 11/13/2017 0753   K 4.0 12/31/2017 1008   K 4.1 11/13/2017 0753   CL 107 12/31/2017 1008   CL 104 02/08/2013 1454   CO2 25 12/31/2017 1008   CO2 27 11/13/2017 0753   GLUCOSE 105 12/31/2017 1008   GLUCOSE 113 11/13/2017 0753   GLUCOSE 103 (H) 02/08/2013 1454   BUN 16 12/31/2017 1008   BUN 11.6 11/13/2017 0753   CREATININE 0.86 12/31/2017 1008   CREATININE 0.8 11/13/2017 0753   CALCIUM 9.9 12/31/2017 1008   CALCIUM 9.3 11/13/2017 0753   PROT 6.4 12/31/2017 1008   PROT 6.2 (L) 11/13/2017 0753   ALBUMIN 3.8 12/31/2017 1008   ALBUMIN 3.3 (L) 11/13/2017 0753   AST 25 12/31/2017 1008   AST 24 11/13/2017 0753   ALT 30 12/31/2017 1008   ALT 24 11/13/2017 0753   ALKPHOS 85 12/31/2017 1008   ALKPHOS 83 11/13/2017 0753   BILITOT 0.6 12/31/2017 1008   BILITOT 0.38 11/13/2017 0753   GFRNONAA >60 12/31/2017 1008   GFRAA >60 12/31/2017 1008    No results found for: SPEP,  UPEP  Lab Results  Component Value Date   WBC 4.4 04/28/2018   NEUTROABS 2.6 04/28/2018   HGB 12.5 04/28/2018   HCT 36.3 04/28/2018   MCV 85.2 04/28/2018   PLT 158 04/28/2018      Chemistry      Component Value Date/Time   NA 143 12/31/2017 1008   NA 141 11/13/2017 0753   K 4.0 12/31/2017 1008   K 4.1 11/13/2017 0753   CL 107 12/31/2017 1008   CL 104 02/08/2013 1454   CO2 25 12/31/2017 1008   CO2 27 11/13/2017 0753   BUN 16 12/31/2017 1008   BUN 11.6 11/13/2017 0753   CREATININE 0.86 12/31/2017 1008   CREATININE 0.8 11/13/2017 0753      Component Value Date/Time   CALCIUM 9.9 12/31/2017 1008   CALCIUM 9.3 11/13/2017 0753   ALKPHOS 85 12/31/2017 1008   ALKPHOS 83 11/13/2017 0753   AST 25 12/31/2017 1008   AST 24 11/13/2017 0753   ALT 30 12/31/2017 1008   ALT 24 11/13/2017 0753   BILITOT 0.6 12/31/2017 1008   BILITOT 0.38 11/13/2017 0753      All questions were answered. The patient knows to call the clinic with any  problems, questions or concerns. No barriers to learning was detected.  I spent 10 minutes counseling the patient face to face. The total time spent in the appointment was 15 minutes and more than 50% was on counseling and review of test results  Heath Lark, MD 04/28/2018 3:05 PM

## 2018-04-28 NOTE — Telephone Encounter (Signed)
Gave avs and calendar ° °

## 2018-04-30 ENCOUNTER — Ambulatory Visit: Payer: Medicare Other | Admitting: Hematology and Oncology

## 2018-04-30 ENCOUNTER — Other Ambulatory Visit: Payer: Medicare Other

## 2018-05-20 DIAGNOSIS — M85852 Other specified disorders of bone density and structure, left thigh: Secondary | ICD-10-CM | POA: Diagnosis not present

## 2018-05-20 DIAGNOSIS — C9191 Lymphoid leukemia, unspecified, in remission: Secondary | ICD-10-CM | POA: Diagnosis not present

## 2018-05-20 DIAGNOSIS — I10 Essential (primary) hypertension: Secondary | ICD-10-CM | POA: Diagnosis not present

## 2018-05-20 DIAGNOSIS — E782 Mixed hyperlipidemia: Secondary | ICD-10-CM | POA: Diagnosis not present

## 2018-05-20 DIAGNOSIS — I480 Paroxysmal atrial fibrillation: Secondary | ICD-10-CM | POA: Diagnosis not present

## 2018-05-21 DIAGNOSIS — H2512 Age-related nuclear cataract, left eye: Secondary | ICD-10-CM | POA: Diagnosis not present

## 2018-06-14 DIAGNOSIS — H2511 Age-related nuclear cataract, right eye: Secondary | ICD-10-CM | POA: Diagnosis not present

## 2018-06-18 DIAGNOSIS — H2511 Age-related nuclear cataract, right eye: Secondary | ICD-10-CM | POA: Diagnosis not present

## 2018-06-29 ENCOUNTER — Ambulatory Visit
Admission: RE | Admit: 2018-06-29 | Discharge: 2018-06-29 | Disposition: A | Discharge status: Home / Self Care | Payer: Medicare Other | Source: Ambulatory Visit | Attending: Family Medicine | Admitting: Family Medicine

## 2018-06-29 DIAGNOSIS — N6310 Unspecified lump in the right breast, unspecified quadrant: Secondary | ICD-10-CM

## 2018-06-29 DIAGNOSIS — R928 Other abnormal and inconclusive findings on diagnostic imaging of breast: Secondary | ICD-10-CM | POA: Diagnosis not present

## 2018-06-29 DIAGNOSIS — N6011 Diffuse cystic mastopathy of right breast: Secondary | ICD-10-CM | POA: Diagnosis not present

## 2018-07-14 ENCOUNTER — Inpatient Hospital Stay (HOSPITAL_BASED_OUTPATIENT_CLINIC_OR_DEPARTMENT_OTHER): Payer: Medicare Other | Admitting: Hematology and Oncology

## 2018-07-14 ENCOUNTER — Inpatient Hospital Stay: Payer: Medicare Other | Attending: Hematology and Oncology

## 2018-07-14 ENCOUNTER — Telehealth: Payer: Self-pay | Admitting: Hematology and Oncology

## 2018-07-14 ENCOUNTER — Encounter: Payer: Self-pay | Admitting: Hematology and Oncology

## 2018-07-14 DIAGNOSIS — D61818 Other pancytopenia: Secondary | ICD-10-CM

## 2018-07-14 DIAGNOSIS — D696 Thrombocytopenia, unspecified: Secondary | ICD-10-CM | POA: Insufficient documentation

## 2018-07-14 DIAGNOSIS — Z9221 Personal history of antineoplastic chemotherapy: Secondary | ICD-10-CM

## 2018-07-14 DIAGNOSIS — C9110 Chronic lymphocytic leukemia of B-cell type not having achieved remission: Secondary | ICD-10-CM

## 2018-07-14 LAB — CBC WITH DIFFERENTIAL/PLATELET
BASOS ABS: 0.1 10*3/uL (ref 0.0–0.1)
BASOS PCT: 1 %
EOS PCT: 4 %
Eosinophils Absolute: 0.2 10*3/uL (ref 0.0–0.5)
HCT: 37.2 % (ref 34.8–46.6)
Hemoglobin: 12.9 g/dL (ref 11.6–15.9)
Lymphocytes Relative: 12 %
Lymphs Abs: 0.6 10*3/uL — ABNORMAL LOW (ref 0.9–3.3)
MCH: 29.3 pg (ref 25.1–34.0)
MCHC: 34.7 g/dL (ref 31.5–36.0)
MCV: 84.5 fL (ref 79.5–101.0)
MONO ABS: 0.5 10*3/uL (ref 0.1–0.9)
MONOS PCT: 11 %
Neutro Abs: 3.5 10*3/uL (ref 1.5–6.5)
Neutrophils Relative %: 72 %
PLATELETS: 139 10*3/uL — AB (ref 145–400)
RBC: 4.4 MIL/uL (ref 3.70–5.45)
RDW: 15 % — AB (ref 11.2–14.5)
WBC: 4.8 10*3/uL (ref 3.9–10.3)

## 2018-07-14 NOTE — Assessment & Plan Note (Signed)
CBC is satisfactory with low lymphocyte count I plan to see her back again in 6 months with history, physical examination and blood work She is educated to watch out for signs and symptoms of cancer recurrence I reinforced the importance of influenza vaccination Due to lack of signs and symptoms of recurrence, I recommend deferring imaging studies for now

## 2018-07-14 NOTE — Progress Notes (Signed)
[place] OFFICE PROGRESS NOTE  Patient Care Team: Cari Caraway, MD as PCP - General (Family Medicine) Irine Seal, MD as Attending Physician (Urology) Jacolyn Reedy, MD as Attending Physician (Cardiology) Heath Lark, MD as Consulting Physician (Hematology and Oncology)  ASSESSMENT & PLAN:  CLL (chronic lymphocytic leukemia) CBC is satisfactory with low lymphocyte count I plan to see her back again in 6 months with history, physical examination and blood work She is educated to watch out for signs and symptoms of cancer recurrence I reinforced the importance of influenza vaccination Due to lack of signs and symptoms of recurrence, I recommend deferring imaging studies for now  Pancytopenia, acquired [facility]) She has mild intermittent thrombocytopenia This could be related to her prior treatment Due to lack of symptoms, I recommend close observation only There is no contraindication for her to remain on aspirin therapy   No orders of the defined types were placed in this encounter.   INTERVAL HISTORY: Please see below for problem oriented charting. She returns for further follow-up No new lymphadenopathy Denies recent infection, fever or chills Her appetite is stable. The patient denies any recent signs or symptoms of bleeding such as spontaneous epistaxis, hematuria or hematochezia.  SUMMARY OF ONCOLOGIC HISTORY: Oncology History   Del 13q     CLL (chronic lymphocytic leukemia) ([place])   02/09/2013 Pathology Results    Peripheral Blood Flow Cytometry - FINDINGS CONSISTENT WITH CHRONIC LYMPHOCYTIC LEUKEMIA    09/02/2013 Pathology Results    FISH positive for deletion 13 q    05/22/2017 Imaging    Worsening mesenteric and retroperitoneal adenopathy concerning for worsening lymphoproliferative disorder/lymphoma.  Small cysts in the liver and kidneys are stable.  Prior cholecystectomy.  Aortic atherosclerosis.  Left colonic diverticulosis  without active diverticulitis.    08/11/2017 Imaging    Chest Impression:  1. Mild mediastinal and hilar lymphadenopathy. 2. Clusters small axial lymph nodes.  Abdomen / Pelvis Impression:  1. Mild periaortic retroperitoneal adenopathy and moderate central mesenteric adenopathy not changed comparison CT. 2. Mild iliac adenopathy 3. Normal volume spleen. 4. No explanation for RIGHT lower quadrant pain. Cluster of lymph nodes in the ileocecal mesenteries similar to prior.    08/12/2017 Procedure    Placement of single lumen port a cath via right internal jugular vein. The catheter tip lies at the cavo-atrial junction. A power injectable port a cath was placed and is ready for immediate use.    08/14/2017 - 11/14/2017 Chemotherapy    She received Bendamustine and Rituxan    11/12/2017 Imaging    1. Continued decrease in mediastinal and hilar lymph nodes with interval resolution of the abdominal and pelvic lymphadenopathy seen on the prior study. There is no lymphadenopathy in the chest, abdomen, or pelvis by CT size criteria on today's study. No new or progressive interval findings. 2. Aortic Atherosclerois (ICD10-170.0) 3. Hepatic and renal cysts, stable.    12/18/2017 Adverse Reaction    We are unable to resume cycle 5 of chemotherapy due to prolonged pancytopenia    01/29/2018 Procedure    Successful right IJ vein Port-A-Cath explant.     REVIEW OF SYSTEMS:   Constitutional: Denies fevers, chills or abnormal weight loss Eyes: Denies blurriness of vision Ears, nose, mouth, throat, and face: Denies mucositis or sore throat Respiratory: Denies cough, dyspnea or wheezes Cardiovascular: Denies palpitation, chest discomfort or lower extremity swelling Gastrointestinal:  Denies nausea, heartburn or change in bowel habits Skin: Denies abnormal skin rashes Lymphatics: Denies new lymphadenopathy  or easy bruising Neurological:Denies numbness, tingling or new weaknesses Behavioral/Psych: Mood  is stable, no new changes  All other systems were reviewed with the patient and are negative.  I have reviewed the past medical history, past surgical history, social history and family history with the patient and they are unchanged from previous note.  ALLERGIES:  is allergic to codeine; demerol [meperidine]; morphine and related; ciprofloxacin; and sulfa antibiotics.  MEDICATIONS:  Current Outpatient Medications  Medication Sig Dispense Refill  . ibandronate (BONIVA) 150 MG tablet Take 150 mg by mouth every 30 (thirty) days.    Marland Kitchen acetaminophen (TYLENOL) 500 MG tablet Take 500 mg by mouth every 6 (six) hours as needed for pain.    Marland Kitchen aspirin 81 MG tablet Take 81 mg by mouth daily.    Marland Kitchen atorvastatin (LIPITOR) 40 MG tablet Take 40 mg by mouth daily.    . Calcium Carb-Cholecalciferol (CALCIUM + D3) 600-200 MG-UNIT TABS Take 1 tablet by mouth 3 (three) times a week.     Marland Kitchen DIGOX 0.125 MG tablet Take 0.125 mg by mouth daily.    . fluticasone (FLONASE) 50 MCG/ACT nasal spray Place 2 sprays into the nose daily.    . folic acid (FOLVITE) 1 MG tablet Take 1 mg by mouth daily.    . hydrochlorothiazide (HYDRODIURIL) 12.5 MG tablet Take 12.5 mg by mouth daily.    . metoprolol (LOPRESSOR) 50 MG tablet Take 50 mg by mouth 2 (two) times daily.    . Multiple Vitamin (MULTIVITAMIN) tablet Take 1 tablet by mouth daily.    . Multiple Vitamins-Minerals (EYE VITAMINS & MINERALS) TABS Take by mouth.    . potassium chloride SA (K-DUR,KLOR-CON) 20 MEQ tablet Take 20 mEq by mouth 3 (three) times a week. Mon, Wed, Fri     No current facility-administered medications for this visit.     PHYSICAL EXAMINATION: ECOG PERFORMANCE STATUS: 0 - Asymptomatic  Vitals:   07/14/18 0844  BP: 127/65  Pulse: (!) 58  Resp: 18  SpO2: 100%   Filed Weights   07/14/18 0844  Weight: 144 lb 14.4 oz (65.7 kg)    GENERAL:alert, no distress and comfortable SKIN: skin color, texture, turgor are normal, no rashes or  significant lesions EYES: normal, Conjunctiva are pink and non-injected, sclera clear OROPHARYNX:no exudate, no erythema and lips, buccal mucosa, and tongue normal  NECK: supple, thyroid normal size, non-tender, without nodularity LYMPH:  no palpable lymphadenopathy in the cervical, axillary or inguinal LUNGS: clear to auscultation and percussion with normal breathing effort HEART: regular rate & rhythm and no murmurs and no lower extremity edema ABDOMEN:abdomen soft, non-tender and normal bowel sounds Musculoskeletal:no cyanosis of digits and no clubbing  NEURO: alert & oriented x 3 with fluent speech, no focal motor/sensory deficits  LABORATORY DATA:  I have reviewed the data as listed    Component Value Date/Time   NA 143 12/31/2017 1008   NA 141 11/13/2017 0753   K 4.0 12/31/2017 1008   K 4.1 11/13/2017 0753   CL 107 12/31/2017 1008   CL 104 02/08/2013 1454   CO2 25 12/31/2017 1008   CO2 27 11/13/2017 0753   GLUCOSE 105 12/31/2017 1008   GLUCOSE 113 11/13/2017 0753   GLUCOSE 103 (H) 02/08/2013 1454   BUN 16 12/31/2017 1008   BUN 11.6 11/13/2017 0753   CREATININE 0.86 12/31/2017 1008   CREATININE 0.8 11/13/2017 0753   CALCIUM 9.9 12/31/2017 1008   CALCIUM 9.3 11/13/2017 0753   PROT 6.4 12/31/2017 1008  PROT 6.2 (L) 11/13/2017 0753   ALBUMIN 3.8 12/31/2017 1008   ALBUMIN 3.3 (L) 11/13/2017 0753   AST 25 12/31/2017 1008   AST 24 11/13/2017 0753   ALT 30 12/31/2017 1008   ALT 24 11/13/2017 0753   ALKPHOS 85 12/31/2017 1008   ALKPHOS 83 11/13/2017 0753   BILITOT 0.6 12/31/2017 1008   BILITOT 0.38 11/13/2017 0753   GFRNONAA >60 12/31/2017 1008   GFRAA >60 12/31/2017 1008    No results found for: SPEP, UPEP  Lab Results  Component Value Date   WBC 4.8 07/14/2018   NEUTROABS 3.5 07/14/2018   HGB 12.9 07/14/2018   HCT 37.2 07/14/2018   MCV 84.5 07/14/2018   PLT 139 (L) 07/14/2018      Chemistry      Component Value Date/Time   NA 143 12/31/2017 1008   NA  141 11/13/2017 0753   K 4.0 12/31/2017 1008   K 4.1 11/13/2017 0753   CL 107 12/31/2017 1008   CL 104 02/08/2013 1454   CO2 25 12/31/2017 1008   CO2 27 11/13/2017 0753   BUN 16 12/31/2017 1008   BUN 11.6 11/13/2017 0753   CREATININE 0.86 12/31/2017 1008   CREATININE 0.8 11/13/2017 0753      Component Value Date/Time   CALCIUM 9.9 12/31/2017 1008   CALCIUM 9.3 11/13/2017 0753   ALKPHOS 85 12/31/2017 1008   ALKPHOS 83 11/13/2017 0753   AST 25 12/31/2017 1008   AST 24 11/13/2017 0753   ALT 30 12/31/2017 1008   ALT 24 11/13/2017 0753   BILITOT 0.6 12/31/2017 1008   BILITOT 0.38 11/13/2017 0753       RADIOGRAPHIC STUDIES: I have personally reviewed the radiological images as listed and agreed with the findings in the report. US Breast Ltd Uni Right Inc Axilla  Result Date: 06/29/2018 CLINICAL DATA:  Follow-up probably benign cluster of cysts in the 5 o'clock position of the right breast.Chronic lymphocytic leukemia. EXAM: DIGITAL DIAGNOSTIC BILATERAL MAMMOGRAM WITH CAD AND TOMO ULTRASOUND RIGHT BREAST COMPARISON:  Previous exam(s). ACR Breast Density Category b: There are scattered areas of fibroglandular density. FINDINGS: A mildly prominent left inferior axillary lymph node is not changed significantly since 10/09/2007. The patient has had similar-appearing lymph nodes on the right on previous examinations, not included today. Multiple rounded and oval, circumscribed masses are again demonstrated in the right breast, without significant change. Smaller, similar-appearing mass on the left are also stable. No interval findings suspicious for malignancy in either breast. Mammographic images were processed with CAD. Targeted ultrasound is performed, showing a 5 x 4 x 2 mm cyst containing thin and mildly thickened internal septations in the 5 o'clock position of the right breast, 2 cm from the nipple. This appears more cystic than on the previous examinations with no soft tissue component. No  internal blood flow was seen with power Doppler. This is unchanged in size since 06/29/2018. IMPRESSION: 1. More cystic appearance of the previously demonstrated probably benign cluster of cysts in the 5 o'clock position of the right breast, unchanged in size. This is compatible with a benign cyst. 2. Stable mildly prominent left axillary lymph node compatible with the patient's known chronic lymphocytic leukemia. 3. No evidence of malignancy in either breast. RECOMMENDATION: Bilateral screening mammogram in 1 year. I have discussed the findings and recommendations with the patient. Results were also provided in writing at the conclusion of the visit. If applicable, a reminder letter will be sent to the patient regarding the next  appointment. BI-RADS CATEGORY  2: Benign. Electronically Signed   By: Claudie Revering M.D.   On: 06/29/2018 11:06   Mm Diag Breast Tomo Bilateral  Result Date: 06/29/2018 CLINICAL DATA:  Follow-up probably benign cluster of cysts in the 5 o'clock position of the right breast.Chronic lymphocytic leukemia. EXAM: DIGITAL DIAGNOSTIC BILATERAL MAMMOGRAM WITH CAD AND TOMO ULTRASOUND RIGHT BREAST COMPARISON:  Previous exam(s). ACR Breast Density Category b: There are scattered areas of fibroglandular density. FINDINGS: A mildly prominent left inferior axillary lymph node is not changed significantly since 10/09/2007. The patient has had similar-appearing lymph nodes on the right on previous examinations, not included today. Multiple rounded and oval, circumscribed masses are again demonstrated in the right breast, without significant change. Smaller, similar-appearing mass on the left are also stable. No interval findings suspicious for malignancy in either breast. Mammographic images were processed with CAD. Targeted ultrasound is performed, showing a 5 x 4 x 2 mm cyst containing thin and mildly thickened internal septations in the 5 o'clock position of the right breast, 2 cm from the nipple.  This appears more cystic than on the previous examinations with no soft tissue component. No internal blood flow was seen with power Doppler. This is unchanged in size since 06/29/2018. IMPRESSION: 1. More cystic appearance of the previously demonstrated probably benign cluster of cysts in the 5 o'clock position of the right breast, unchanged in size. This is compatible with a benign cyst. 2. Stable mildly prominent left axillary lymph node compatible with the patient's known chronic lymphocytic leukemia. 3. No evidence of malignancy in either breast. RECOMMENDATION: Bilateral screening mammogram in 1 year. I have discussed the findings and recommendations with the patient. Results were also provided in writing at the conclusion of the visit. If applicable, a reminder letter will be sent to the patient regarding the next appointment. BI-RADS CATEGORY  2: Benign. Electronically Signed   By: Claudie Revering M.D.   On: 06/29/2018 11:06    All questions were answered. The patient knows to call the clinic with any problems, questions or concerns. No barriers to learning was detected.  I spent 10 minutes counseling the patient face to face. The total time spent in the appointment was 15 minutes and more than 50% was on counseling and review of test results  Heath Lark, MD 07/14/2018 11:28 AM

## 2018-07-14 NOTE — Assessment & Plan Note (Addendum)
She has mild intermittent thrombocytopenia This could be related to her prior treatment Due to lack of symptoms, I recommend close observation only There is no contraindication for her to remain on aspirin therapy

## 2018-07-14 NOTE — Telephone Encounter (Signed)
Gave pt avs and calendar  °

## 2018-08-10 DIAGNOSIS — Z23 Encounter for immunization: Secondary | ICD-10-CM | POA: Diagnosis not present

## 2018-08-21 ENCOUNTER — Other Ambulatory Visit: Payer: Self-pay

## 2018-08-21 MED ORDER — DIGOXIN 125 MCG PO TABS: 0.1250 mg | ORAL_TABLET | Freq: Every day | ORAL | 0 refills | Status: DC

## 2018-08-21 NOTE — Telephone Encounter (Signed)
Refill request on patient from Dr. Thurman Coyer office for Digoxin. 90 day sent to mail order, okayed by Dr. Agustin Cree

## 2018-10-20 DIAGNOSIS — Z7189 Other specified counseling: Secondary | ICD-10-CM | POA: Diagnosis not present

## 2018-10-20 DIAGNOSIS — Z0000 Encounter for general adult medical examination without abnormal findings: Secondary | ICD-10-CM | POA: Diagnosis not present

## 2018-10-20 DIAGNOSIS — C9191 Lymphoid leukemia, unspecified, in remission: Secondary | ICD-10-CM | POA: Diagnosis not present

## 2018-10-20 DIAGNOSIS — I10 Essential (primary) hypertension: Secondary | ICD-10-CM | POA: Diagnosis not present

## 2018-10-20 DIAGNOSIS — Z86010 Personal history of colonic polyps: Secondary | ICD-10-CM | POA: Diagnosis not present

## 2018-10-20 DIAGNOSIS — E559 Vitamin D deficiency, unspecified: Secondary | ICD-10-CM | POA: Diagnosis not present

## 2018-10-20 DIAGNOSIS — E782 Mixed hyperlipidemia: Secondary | ICD-10-CM | POA: Diagnosis not present

## 2018-10-20 DIAGNOSIS — J309 Allergic rhinitis, unspecified: Secondary | ICD-10-CM | POA: Diagnosis not present

## 2018-10-20 DIAGNOSIS — Z79899 Other long term (current) drug therapy: Secondary | ICD-10-CM | POA: Diagnosis not present

## 2018-10-20 DIAGNOSIS — Z1389 Encounter for screening for other disorder: Secondary | ICD-10-CM | POA: Diagnosis not present

## 2018-10-20 DIAGNOSIS — I480 Paroxysmal atrial fibrillation: Secondary | ICD-10-CM | POA: Diagnosis not present

## 2018-10-20 DIAGNOSIS — M85852 Other specified disorders of bone density and structure, left thigh: Secondary | ICD-10-CM | POA: Diagnosis not present

## 2018-10-23 DIAGNOSIS — D122 Benign neoplasm of ascending colon: Secondary | ICD-10-CM | POA: Diagnosis not present

## 2018-10-23 DIAGNOSIS — Z1211 Encounter for screening for malignant neoplasm of colon: Secondary | ICD-10-CM | POA: Diagnosis not present

## 2018-10-23 DIAGNOSIS — Z800 Family history of malignant neoplasm of digestive organs: Secondary | ICD-10-CM | POA: Diagnosis not present

## 2018-10-23 DIAGNOSIS — D128 Benign neoplasm of rectum: Secondary | ICD-10-CM | POA: Diagnosis not present

## 2018-10-27 DIAGNOSIS — D128 Benign neoplasm of rectum: Secondary | ICD-10-CM | POA: Diagnosis not present

## 2018-10-27 DIAGNOSIS — Z1211 Encounter for screening for malignant neoplasm of colon: Secondary | ICD-10-CM | POA: Diagnosis not present

## 2018-10-27 DIAGNOSIS — D122 Benign neoplasm of ascending colon: Secondary | ICD-10-CM | POA: Diagnosis not present

## 2018-11-29 DIAGNOSIS — W19XXXA Unspecified fall, initial encounter: Secondary | ICD-10-CM | POA: Diagnosis not present

## 2018-11-29 DIAGNOSIS — S0590XA Unspecified injury of unspecified eye and orbit, initial encounter: Secondary | ICD-10-CM | POA: Diagnosis not present

## 2019-01-11 ENCOUNTER — Other Ambulatory Visit: Payer: Self-pay | Admitting: Hematology and Oncology

## 2019-01-11 DIAGNOSIS — C9110 Chronic lymphocytic leukemia of B-cell type not having achieved remission: Secondary | ICD-10-CM

## 2019-01-12 ENCOUNTER — Inpatient Hospital Stay (HOSPITAL_BASED_OUTPATIENT_CLINIC_OR_DEPARTMENT_OTHER): Payer: Medicare Other | Admitting: Hematology and Oncology

## 2019-01-12 ENCOUNTER — Telehealth: Payer: Self-pay | Admitting: Hematology and Oncology

## 2019-01-12 ENCOUNTER — Inpatient Hospital Stay: Payer: Medicare Other | Attending: Hematology and Oncology

## 2019-01-12 ENCOUNTER — Encounter: Payer: Self-pay | Admitting: Hematology and Oncology

## 2019-01-12 DIAGNOSIS — C9110 Chronic lymphocytic leukemia of B-cell type not having achieved remission: Secondary | ICD-10-CM | POA: Insufficient documentation

## 2019-01-12 DIAGNOSIS — D696 Thrombocytopenia, unspecified: Secondary | ICD-10-CM

## 2019-01-12 LAB — COMPREHENSIVE METABOLIC PANEL
ALT: 25 U/L (ref 0–44)
ANION GAP: 9 (ref 5–15)
AST: 21 U/L (ref 15–41)
Albumin: 4 g/dL (ref 3.5–5.0)
Alkaline Phosphatase: 67 U/L (ref 38–126)
BUN: 14 mg/dL (ref 8–23)
CHLORIDE: 105 mmol/L (ref 98–111)
CO2: 28 mmol/L (ref 22–32)
Calcium: 9.3 mg/dL (ref 8.9–10.3)
Creatinine, Ser: 0.98 mg/dL (ref 0.44–1.00)
GFR, EST NON AFRICAN AMERICAN: 57 mL/min — AB (ref >60)
Glucose, Bld: 113 mg/dL — ABNORMAL HIGH (ref 70–99)
POTASSIUM: 4.1 mmol/L (ref 3.5–5.1)
Sodium: 142 mmol/L (ref 135–145)
Total Bilirubin: 0.7 mg/dL (ref 0.3–1.2)
Total Protein: 6.7 g/dL (ref 6.5–8.1)

## 2019-01-12 LAB — CBC WITH DIFFERENTIAL/PLATELET
Abs Immature Granulocytes: 0.04 10*3/uL (ref 0.00–0.07)
BASOS ABS: 0.1 10*3/uL (ref 0.0–0.1)
Basophils Relative: 1 %
EOS PCT: 5 %
Eosinophils Absolute: 0.3 10*3/uL (ref 0.0–0.5)
HEMATOCRIT: 40.1 % (ref 36.0–46.0)
HEMOGLOBIN: 13.1 g/dL (ref 12.0–15.0)
IMMATURE GRANULOCYTES: 1 %
LYMPHS ABS: 1.2 10*3/uL (ref 0.7–4.0)
LYMPHS PCT: 20 %
MCH: 27.5 pg (ref 26.0–34.0)
MCHC: 32.7 g/dL (ref 30.0–36.0)
MCV: 84.1 fL (ref 80.0–100.0)
Monocytes Absolute: 0.6 10*3/uL (ref 0.1–1.0)
Monocytes Relative: 9 %
NEUTROS ABS: 4 10*3/uL (ref 1.7–7.7)
NEUTROS PCT: 64 %
NRBC: 0 % (ref 0.0–0.2)
Platelets: 132 10*3/uL — ABNORMAL LOW (ref 150–400)
RBC: 4.77 MIL/uL (ref 3.87–5.11)
RDW: 14.3 % (ref 11.5–15.5)
WBC: 6.2 10*3/uL (ref 4.0–10.5)

## 2019-01-12 LAB — LACTATE DEHYDROGENASE: LDH: 144 U/L (ref 98–192)

## 2019-01-12 NOTE — Progress Notes (Signed)
[place] OFFICE PROGRESS NOTE  Patient Care Team: Cari Caraway, MD as PCP - General (Family Medicine) Irine Seal, MD as Attending Physician (Urology) Jacolyn Reedy, MD as Attending Physician (Cardiology) Heath Lark, MD as Consulting Physician (Hematology and Oncology)  ASSESSMENT & PLAN:  CLL (chronic lymphocytic leukemia) CBC is satisfactory  I plan to see her back again in 6 months with history, physical examination and blood work She is educated to watch out for signs and symptoms of cancer recurrence Due to lack of signs and symptoms of recurrence, I recommend deferring imaging studies for now  Thrombocytopenia [facility]) The cause is unknown but she has fluctuation of platelet count in the past She is not symptomatic Observe only   No orders of the defined types were placed in this encounter.   INTERVAL HISTORY: Please see below for problem oriented charting. She returns for further follow-up She feels fine No recent infection, fever or chills No new lymphadenopathy Denies abnormal night sweats or weight loss  SUMMARY OF ONCOLOGIC HISTORY: Oncology History   Del 13q     CLL (chronic lymphocytic leukemia) ([place])   02/09/2013 Pathology Results    Peripheral Blood Flow Cytometry - FINDINGS CONSISTENT WITH CHRONIC LYMPHOCYTIC LEUKEMIA    09/02/2013 Pathology Results    FISH positive for deletion 13 q    05/22/2017 Imaging    Worsening mesenteric and retroperitoneal adenopathy concerning for worsening lymphoproliferative disorder/lymphoma.  Small cysts in the liver and kidneys are stable.  Prior cholecystectomy.  Aortic atherosclerosis.  Left colonic diverticulosis without active diverticulitis.    08/11/2017 Imaging    Chest Impression:  1. Mild mediastinal and hilar lymphadenopathy. 2. Clusters small axial lymph nodes.  Abdomen / Pelvis Impression:  1. Mild periaortic retroperitoneal adenopathy and moderate central mesenteric  adenopathy not changed comparison CT. 2. Mild iliac adenopathy 3. Normal volume spleen. 4. No explanation for RIGHT lower quadrant pain. Cluster of lymph nodes in the ileocecal mesenteries similar to prior.    08/12/2017 Procedure    Placement of single lumen port a cath via right internal jugular vein. The catheter tip lies at the cavo-atrial junction. A power injectable port a cath was placed and is ready for immediate use.    08/14/2017 - 11/14/2017 Chemotherapy    She received Bendamustine and Rituxan    11/12/2017 Imaging    1. Continued decrease in mediastinal and hilar lymph nodes with interval resolution of the abdominal and pelvic lymphadenopathy seen on the prior study. There is no lymphadenopathy in the chest, abdomen, or pelvis by CT size criteria on today's study. No new or progressive interval findings. 2. Aortic Atherosclerois (ICD10-170.0) 3. Hepatic and renal cysts, stable.    12/18/2017 Adverse Reaction    We are unable to resume cycle 5 of chemotherapy due to prolonged pancytopenia    01/29/2018 Procedure    Successful right IJ vein Port-A-Cath explant.     REVIEW OF SYSTEMS:   Constitutional: Denies fevers, chills or abnormal weight loss Eyes: Denies blurriness of vision Ears, nose, mouth, throat, and face: Denies mucositis or sore throat Respiratory: Denies cough, dyspnea or wheezes Cardiovascular: Denies palpitation, chest discomfort or lower extremity swelling Gastrointestinal:  Denies nausea, heartburn or change in bowel habits Skin: Denies abnormal skin rashes Lymphatics: Denies new lymphadenopathy or easy bruising Neurological:Denies numbness, tingling or new weaknesses Behavioral/Psych: Mood is stable, no new changes  All other systems were reviewed with the patient and are negative.  I have reviewed the past medical history,  past surgical history, social history and family history with the patient and they are unchanged from previous note.  ALLERGIES:  is  allergic to codeine; demerol [meperidine]; morphine and related; ciprofloxacin; and sulfa antibiotics.  MEDICATIONS:  Current Outpatient Medications  Medication Sig Dispense Refill  . acetaminophen (TYLENOL) 500 MG tablet Take 500 mg by mouth every 6 (six) hours as needed for pain.    Marland Kitchen aspirin 81 MG tablet Take 81 mg by mouth daily.    Marland Kitchen atorvastatin (LIPITOR) 40 MG tablet Take 40 mg by mouth daily.    . Calcium Carb-Cholecalciferol (CALCIUM + D3) 600-200 MG-UNIT TABS Take 1 tablet by mouth 3 (three) times a week.     . digoxin (DIGOX) 0.125 MG tablet Take 1 tablet (0.125 mg total) by mouth daily. 90 tablet 0  . fluticasone (FLONASE) 50 MCG/ACT nasal spray Place 2 sprays into the nose daily.    . folic acid (FOLVITE) 1 MG tablet Take 1 mg by mouth daily.    . hydrochlorothiazide (HYDRODIURIL) 12.5 MG tablet Take 12.5 mg by mouth daily.    Marland Kitchen ibandronate (BONIVA) 150 MG tablet Take 150 mg by mouth every 30 (thirty) days.    . metoprolol (LOPRESSOR) 50 MG tablet Take 50 mg by mouth 2 (two) times daily.    . Multiple Vitamin (MULTIVITAMIN) tablet Take 1 tablet by mouth daily.    . Multiple Vitamins-Minerals (EYE VITAMINS & MINERALS) TABS Take by mouth.    . potassium chloride SA (K-DUR,KLOR-CON) 20 MEQ tablet Take 20 mEq by mouth 3 (three) times a week. Mon, Wed, Fri     No current facility-administered medications for this visit.     PHYSICAL EXAMINATION: ECOG PERFORMANCE STATUS: 0 - Asymptomatic  Vitals:   01/12/19 1008  BP: (!) 140/57  Pulse: (!) 59  Resp: 18  Temp: 98.3 F (36.8 C)  SpO2: 97%   Filed Weights   01/12/19 1008  Weight: 144 lb 3.2 oz (65.4 kg)    GENERAL:alert, no distress and comfortable SKIN: skin color, texture, turgor are normal, no rashes or significant lesions EYES: normal, Conjunctiva are pink and non-injected, sclera clear OROPHARYNX:no exudate, no erythema and lips, buccal mucosa, and tongue normal  NECK: supple, thyroid normal size, non-tender,  without nodularity LYMPH:  no palpable lymphadenopathy in the cervical, axillary or inguinal LUNGS: clear to auscultation and percussion with normal breathing effort HEART: regular rate & rhythm and no murmurs and no lower extremity edema ABDOMEN:abdomen soft, non-tender and normal bowel sounds Musculoskeletal:no cyanosis of digits and no clubbing  NEURO: alert & oriented x 3 with fluent speech, no focal motor/sensory deficits  LABORATORY DATA:  I have reviewed the data as listed    Component Value Date/Time   NA 142 01/12/2019 0938   NA 141 11/13/2017 0753   K 4.1 01/12/2019 0938   K 4.1 11/13/2017 0753   CL 105 01/12/2019 0938   CL 104 02/08/2013 1454   CO2 28 01/12/2019 0938   CO2 27 11/13/2017 0753   GLUCOSE 113 (H) 01/12/2019 0938   GLUCOSE 113 11/13/2017 0753   GLUCOSE 103 (H) 02/08/2013 1454   BUN 14 01/12/2019 0938   BUN 11.6 11/13/2017 0753   CREATININE 0.98 01/12/2019 0938   CREATININE 0.8 11/13/2017 0753   CALCIUM 9.3 01/12/2019 0938   CALCIUM 9.3 11/13/2017 0753   PROT 6.7 01/12/2019 0938   PROT 6.2 (L) 11/13/2017 0753   ALBUMIN 4.0 01/12/2019 0938   ALBUMIN 3.3 (L) 11/13/2017 0753   AST  21 01/12/2019 0938   AST 24 11/13/2017 0753   ALT 25 01/12/2019 0938   ALT 24 11/13/2017 0753   ALKPHOS 67 01/12/2019 0938   ALKPHOS 83 11/13/2017 0753   BILITOT 0.7 01/12/2019 0938   BILITOT 0.38 11/13/2017 0753   GFRNONAA 57 (L) 01/12/2019 0938   GFRAA >60 01/12/2019 0938    No results found for: SPEP, UPEP  Lab Results  Component Value Date   WBC 6.2 01/12/2019   NEUTROABS 4.0 01/12/2019   HGB 13.1 01/12/2019   HCT 40.1 01/12/2019   MCV 84.1 01/12/2019   PLT 132 (L) 01/12/2019      Chemistry      Component Value Date/Time   NA 142 01/12/2019 0938   NA 141 11/13/2017 0753   K 4.1 01/12/2019 0938   K 4.1 11/13/2017 0753   CL 105 01/12/2019 0938   CL 104 02/08/2013 1454   CO2 28 01/12/2019 0938   CO2 27 11/13/2017 0753   BUN 14 01/12/2019 0938   BUN  11.6 11/13/2017 0753   CREATININE 0.98 01/12/2019 0938   CREATININE 0.8 11/13/2017 0753      Component Value Date/Time   CALCIUM 9.3 01/12/2019 0938   CALCIUM 9.3 11/13/2017 0753   ALKPHOS 67 01/12/2019 0938   ALKPHOS 83 11/13/2017 0753   AST 21 01/12/2019 0938   AST 24 11/13/2017 0753   ALT 25 01/12/2019 0938   ALT 24 11/13/2017 0753   BILITOT 0.7 01/12/2019 0938   BILITOT 0.38 11/13/2017 0753      All questions were answered. The patient knows to call the clinic with any problems, questions or concerns. No barriers to learning was detected.  I spent 10 minutes counseling the patient face to face. The total time spent in the appointment was 15 minutes and more than 50% was on counseling and review of test results  Heath Lark, MD 01/12/2019 10:36 AM

## 2019-01-12 NOTE — Telephone Encounter (Signed)
Gave avs and calendar ° °

## 2019-01-12 NOTE — Assessment & Plan Note (Signed)
The cause is unknown but she has fluctuation of platelet count in the past She is not symptomatic Observe only

## 2019-01-12 NOTE — Assessment & Plan Note (Signed)
CBC is satisfactory  I plan to see her back again in 6 months with history, physical examination and blood work She is educated to watch out for signs and symptoms of cancer recurrence Due to lack of signs and symptoms of recurrence, I recommend deferring imaging studies for now

## 2019-01-22 DIAGNOSIS — H04123 Dry eye syndrome of bilateral lacrimal glands: Secondary | ICD-10-CM | POA: Diagnosis not present

## 2019-01-22 DIAGNOSIS — H01023 Squamous blepharitis right eye, unspecified eyelid: Secondary | ICD-10-CM | POA: Diagnosis not present

## 2019-01-22 DIAGNOSIS — H353132 Nonexudative age-related macular degeneration, bilateral, intermediate dry stage: Secondary | ICD-10-CM | POA: Diagnosis not present

## 2019-01-22 DIAGNOSIS — H01026 Squamous blepharitis left eye, unspecified eyelid: Secondary | ICD-10-CM | POA: Diagnosis not present

## 2019-01-22 DIAGNOSIS — H43813 Vitreous degeneration, bilateral: Secondary | ICD-10-CM | POA: Diagnosis not present

## 2019-01-22 DIAGNOSIS — Z961 Presence of intraocular lens: Secondary | ICD-10-CM | POA: Diagnosis not present

## 2019-03-01 DIAGNOSIS — D485 Neoplasm of uncertain behavior of skin: Secondary | ICD-10-CM | POA: Diagnosis not present

## 2019-03-01 DIAGNOSIS — L570 Actinic keratosis: Secondary | ICD-10-CM | POA: Diagnosis not present

## 2019-03-01 DIAGNOSIS — D0462 Carcinoma in situ of skin of left upper limb, including shoulder: Secondary | ICD-10-CM | POA: Diagnosis not present

## 2019-03-01 DIAGNOSIS — L409 Psoriasis, unspecified: Secondary | ICD-10-CM | POA: Diagnosis not present

## 2019-03-16 DIAGNOSIS — D0462 Carcinoma in situ of skin of left upper limb, including shoulder: Secondary | ICD-10-CM | POA: Diagnosis not present

## 2019-04-20 DIAGNOSIS — L720 Epidermal cyst: Secondary | ICD-10-CM | POA: Diagnosis not present

## 2019-04-22 DIAGNOSIS — I10 Essential (primary) hypertension: Secondary | ICD-10-CM | POA: Diagnosis not present

## 2019-04-22 DIAGNOSIS — M85852 Other specified disorders of bone density and structure, left thigh: Secondary | ICD-10-CM | POA: Diagnosis not present

## 2019-04-22 DIAGNOSIS — J309 Allergic rhinitis, unspecified: Secondary | ICD-10-CM | POA: Diagnosis not present

## 2019-04-22 DIAGNOSIS — E782 Mixed hyperlipidemia: Secondary | ICD-10-CM | POA: Diagnosis not present

## 2019-04-22 DIAGNOSIS — I480 Paroxysmal atrial fibrillation: Secondary | ICD-10-CM | POA: Diagnosis not present

## 2019-04-22 DIAGNOSIS — Z800 Family history of malignant neoplasm of digestive organs: Secondary | ICD-10-CM | POA: Diagnosis not present

## 2019-04-22 DIAGNOSIS — C9191 Lymphoid leukemia, unspecified, in remission: Secondary | ICD-10-CM | POA: Diagnosis not present

## 2019-04-22 DIAGNOSIS — Z86010 Personal history of colonic polyps: Secondary | ICD-10-CM | POA: Diagnosis not present

## 2019-05-06 DIAGNOSIS — I10 Essential (primary) hypertension: Secondary | ICD-10-CM | POA: Diagnosis not present

## 2019-05-06 DIAGNOSIS — E782 Mixed hyperlipidemia: Secondary | ICD-10-CM | POA: Diagnosis not present

## 2019-05-06 DIAGNOSIS — J309 Allergic rhinitis, unspecified: Secondary | ICD-10-CM | POA: Diagnosis not present

## 2019-05-06 DIAGNOSIS — Z800 Family history of malignant neoplasm of digestive organs: Secondary | ICD-10-CM | POA: Diagnosis not present

## 2019-05-06 DIAGNOSIS — I480 Paroxysmal atrial fibrillation: Secondary | ICD-10-CM | POA: Diagnosis not present

## 2019-05-06 DIAGNOSIS — M85852 Other specified disorders of bone density and structure, left thigh: Secondary | ICD-10-CM | POA: Diagnosis not present

## 2019-05-06 DIAGNOSIS — C9191 Lymphoid leukemia, unspecified, in remission: Secondary | ICD-10-CM | POA: Diagnosis not present

## 2019-05-06 DIAGNOSIS — Z86010 Personal history of colonic polyps: Secondary | ICD-10-CM | POA: Diagnosis not present

## 2019-05-06 IMAGING — CT CT CHEST W/ CM
3 of 6 series · 14 of 36 positions shown, 17 images · IV contrast (ISOVUE 300)
Comparison: CT 05/22/2017

CLINICAL DATA: Chronic lymphocytic leukemia. RIGHT lower quadrant
discomfort for 16 weeks.

EXAM:
CT CHEST, ABDOMEN, AND PELVIS WITH CONTRAST
TECHNIQUE: Multidetector CT imaging of the chest, abdomen and pelvis was
performed following the standard protocol during bolus
administration of intravenous contrast.
CONTRAST:  100mL 9MD2F4-ANN IOPAMIDOL (9MD2F4-ANN) INJECTION 61%,
30mL 9MD2F4-ANN IOPAMIDOL (9MD2F4-ANN) INJECTION 61%

[Series 2: cap with · axial · 0.73mm/px · z∈[+1180,+1665]mm · 9 of 123 slices shown, 12 images]
[im 13/123  mediastinal]
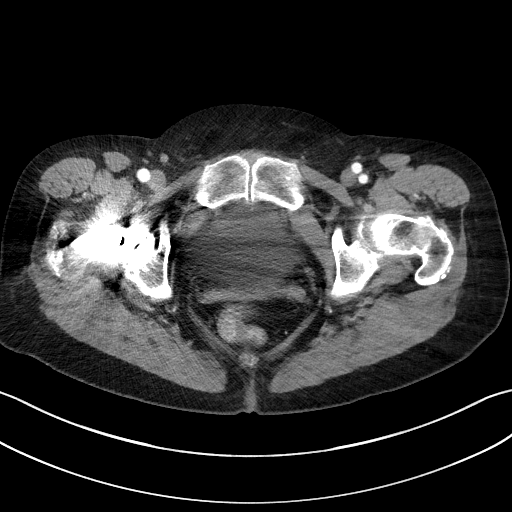
[im 13/123  lung]
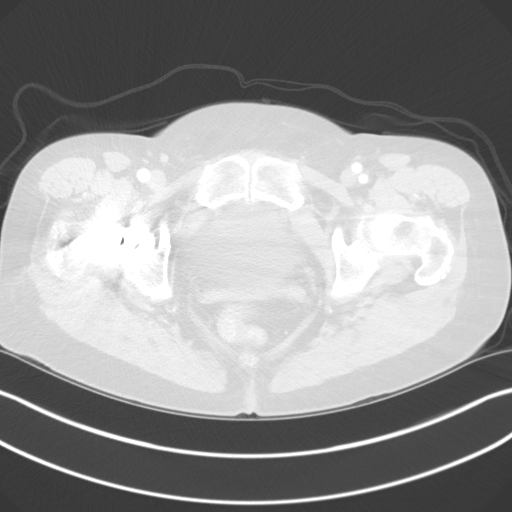
[im 25/123  lung]
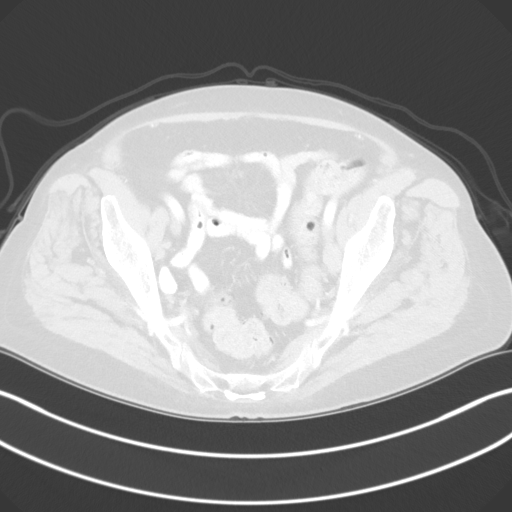
[im 37/123  lung]
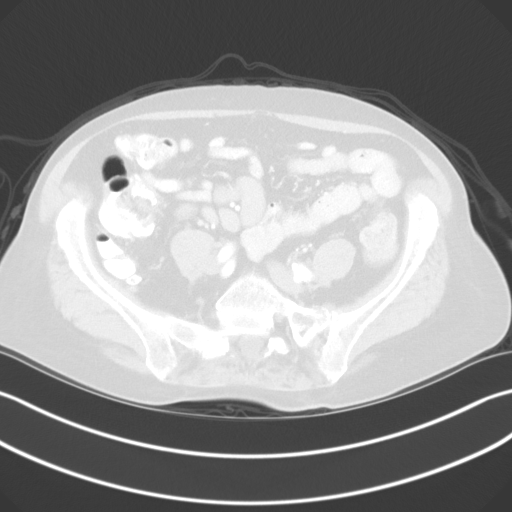
[im 49/123  lung]
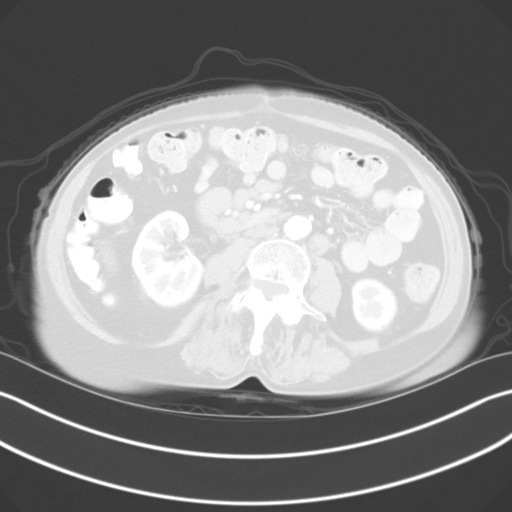
[im 62/123  mediastinal]
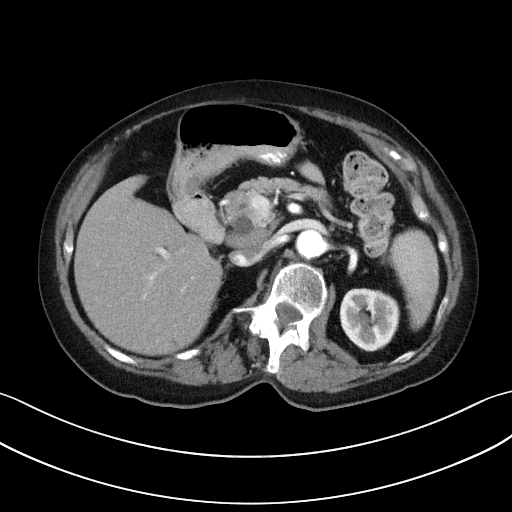
[im 62/123  lung]
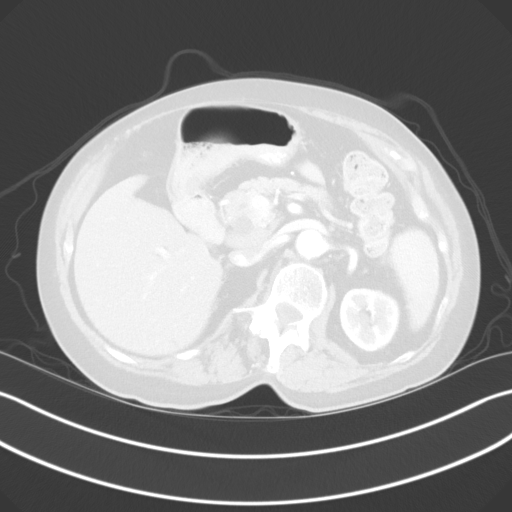
[im 74/123  lung]
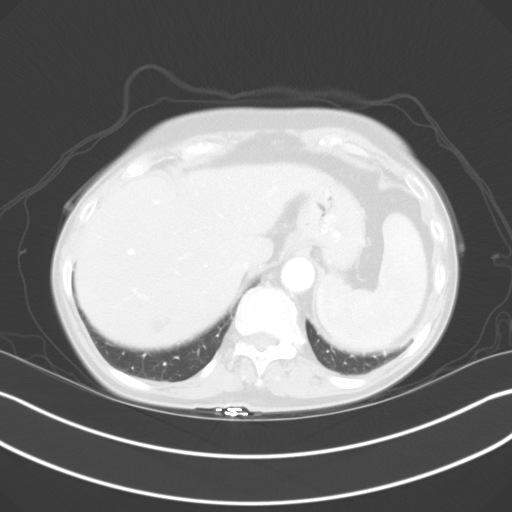
[im 86/123  lung]
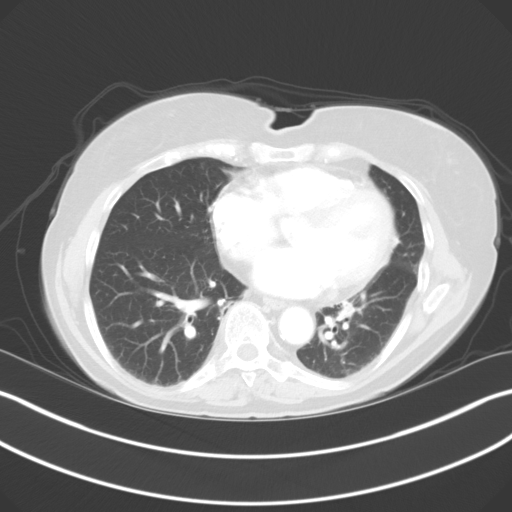
[im 98/123  lung]
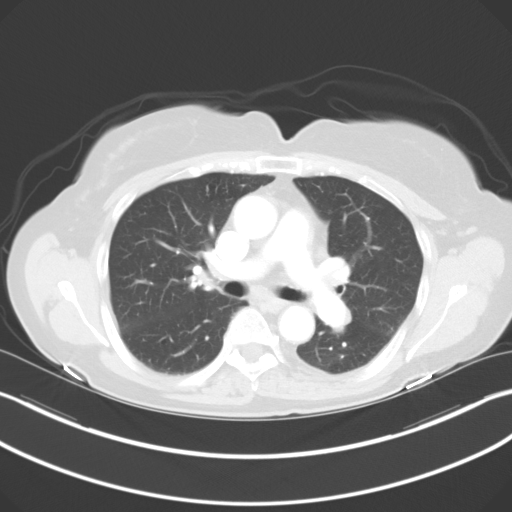
[im 110/123  mediastinal]
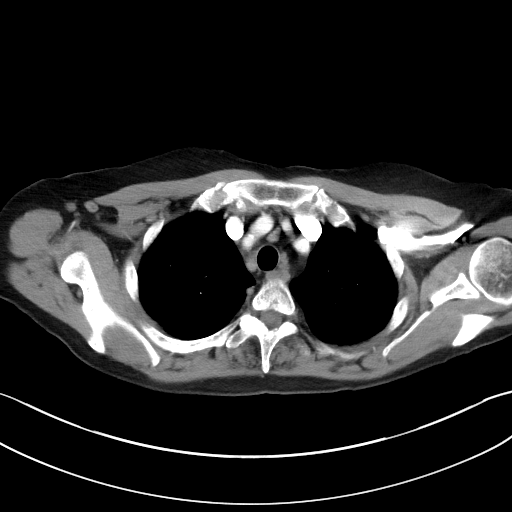
[im 110/123  lung]
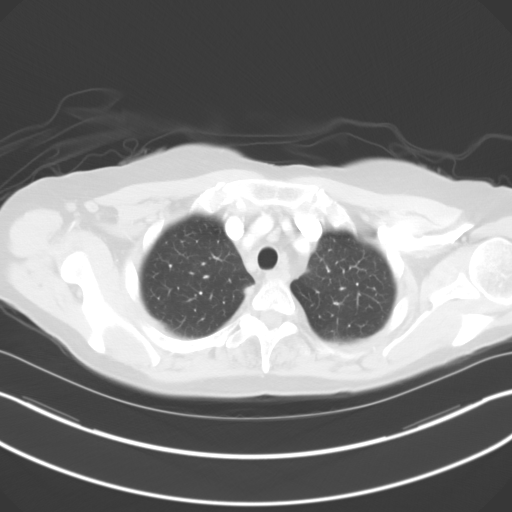

[Series 4: lung · axial · 0.73mm/px · z∈[+1468,+1494]mm · 2 of 145 slices shown]
[im 14/145  lung]
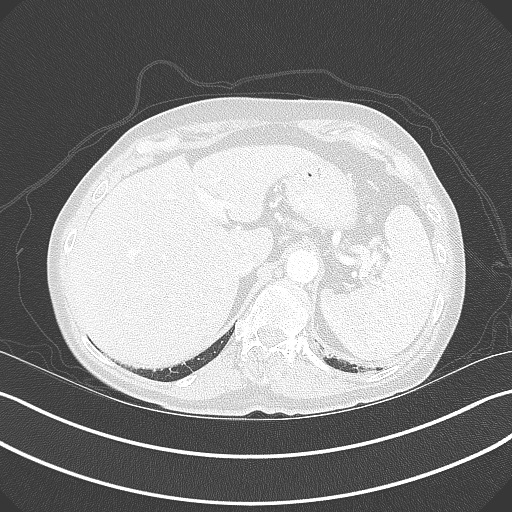
[im 27/145  lung]
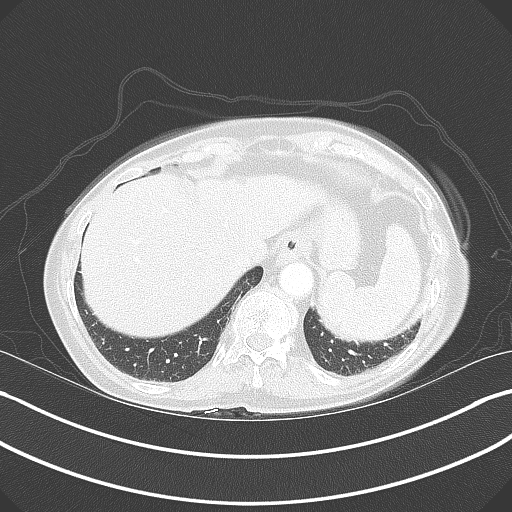

[Series 5: coronals · coronal · 0.89mm/px · 3 of 134 slices shown]
[im 27/134  lung]
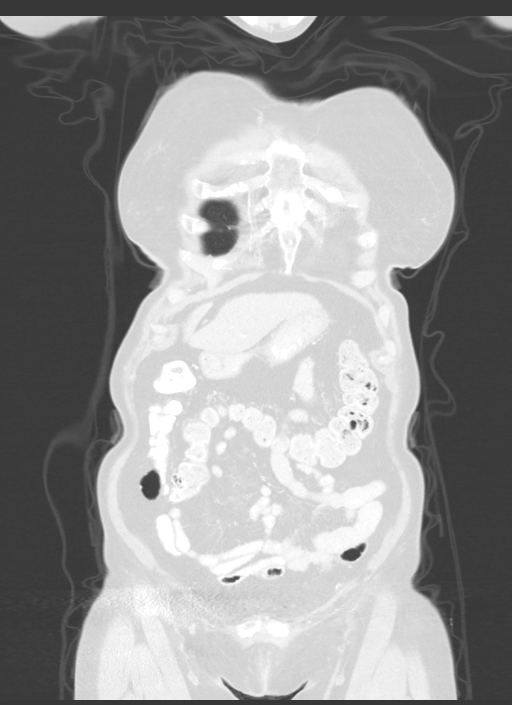
[im 54/134  lung]
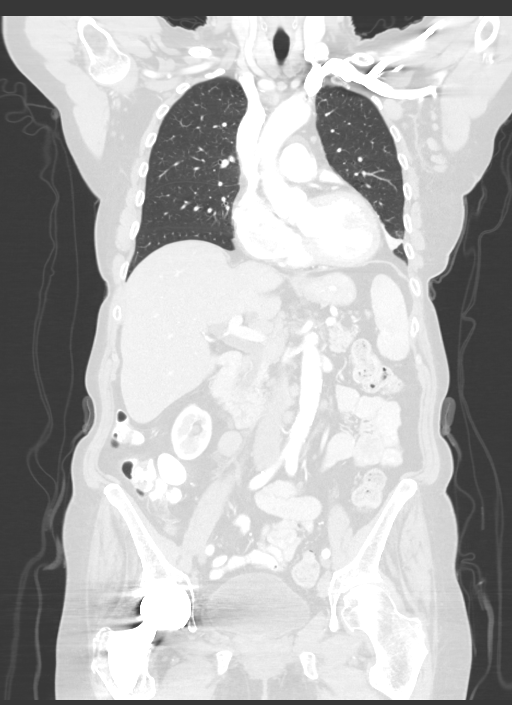
[im 80/134  lung]
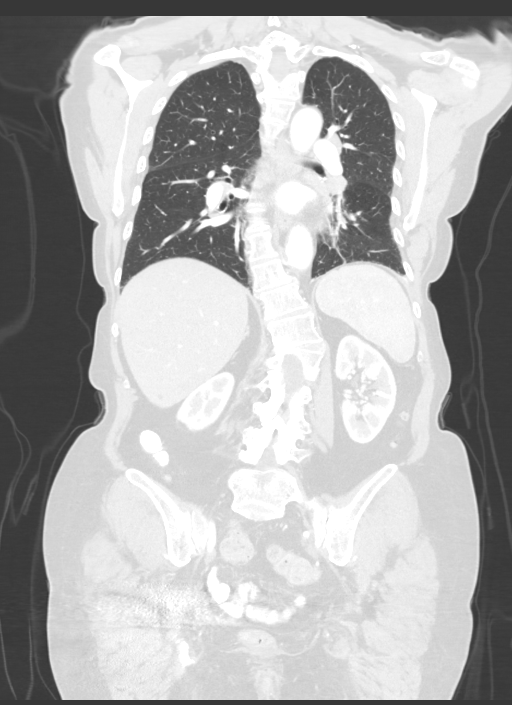

[14 of 36 positions shown; findings below may reference images not displayed]

FINDINGS: CT CHEST FINDINGS

CT CHEST FINDINGS

Cardiovascular: No significant vascular findings. Normal heart size.
No pericardial effusion.

Mediastinum/Nodes: Small axillary lymph nodes clustered and axilla
measuring 10 10 mm short axis. No supraclavicular nodes. Small
mediastinal lymph nodes are present. For example prevascular
precarinal lymph node measuring 13 mm short axis. Precarinal lymph
node prevascular lymph node measuring 8 mm short axis. RIGHT hilar
lymph node measuring 15 mm.

Lungs/Pleura: Lungs are clear

Musculoskeletal: No aggressive osseous lesion.

CT ABDOMEN AND PELVIS FINDINGS

Hepatobiliary: Low-density lesions in liver consistent benign cyst.
This dilatation of the common bile duct to 11 mm following
cholecystectomy. No pancreatic duct dilatation

Pancreas: Pancreas is normal. No ductal dilatation. No pancreatic
inflammation.

Spleen: Normal spleen

Adrenals/urinary tract: Adrenal glands and kidneys are normal. The
ureters and bladder normal.

Stomach/Bowel: Stomach, small-bowel cecum normal. The distal ileum
is collapsed leading up to the terminal ileum. No clear evidence
acute inflammation. There is several enlarged lymph nodes adjacent
to the distal ileum. Colon and rectosigmoid colon normal

Vascular/Lymphatic:  Abdominal aorta has not calcification.

There is mild periaortic lymphadenopathy measuring up to 11 mm short
axis LEFT all aorta. Larger lymph nodes are present within the
central mesenteries along the branches of the SMA. Example lymph
node in the upper pelvis/central mesenteries measuring 18 mm short
axis image 86

Cluster of lymph nodes along the root of the mesentery (image 80,
series 2) these mesenteric lymph nodes are not changed from
comparison CT.

Mild iliac adenopathy present. Lymph node in the LEFT operator space
measuring 12 mm short axis compares to 15 no significant inguinal
adenopathy.

Reproductive: Prostate post hysterectomy anatomy

Other: No free fluid.

Musculoskeletal: No aggressive osseous lesion.
IMPRESSION: Chest Impression:

1. Mild mediastinal and hilar lymphadenopathy.
2. Clusters small axial lymph nodes.

Abdomen / Pelvis Impression:

1. Mild periaortic retroperitoneal adenopathy and moderate central
mesenteric adenopathy not changed comparison CT.
2. Mild iliac adenopathy
3. Normal volume spleen.
4. No explanation for RIGHT lower quadrant pain. Cluster of lymph
nodes in the ileocecal mesenteries similar to prior.

## 2019-05-07 IMAGING — XA IR US GUIDE VASC ACCESS RIGHT
1 series · 1 of 1 positions shown · non-contrast
Comparison: none

CLINICAL DATA: Progressive chronic lymphocytic leukemia.
Port-A-Cath required for chemotherapy.

[Series 300: line placements · 1 of 1 slices shown]
[im 1/1]
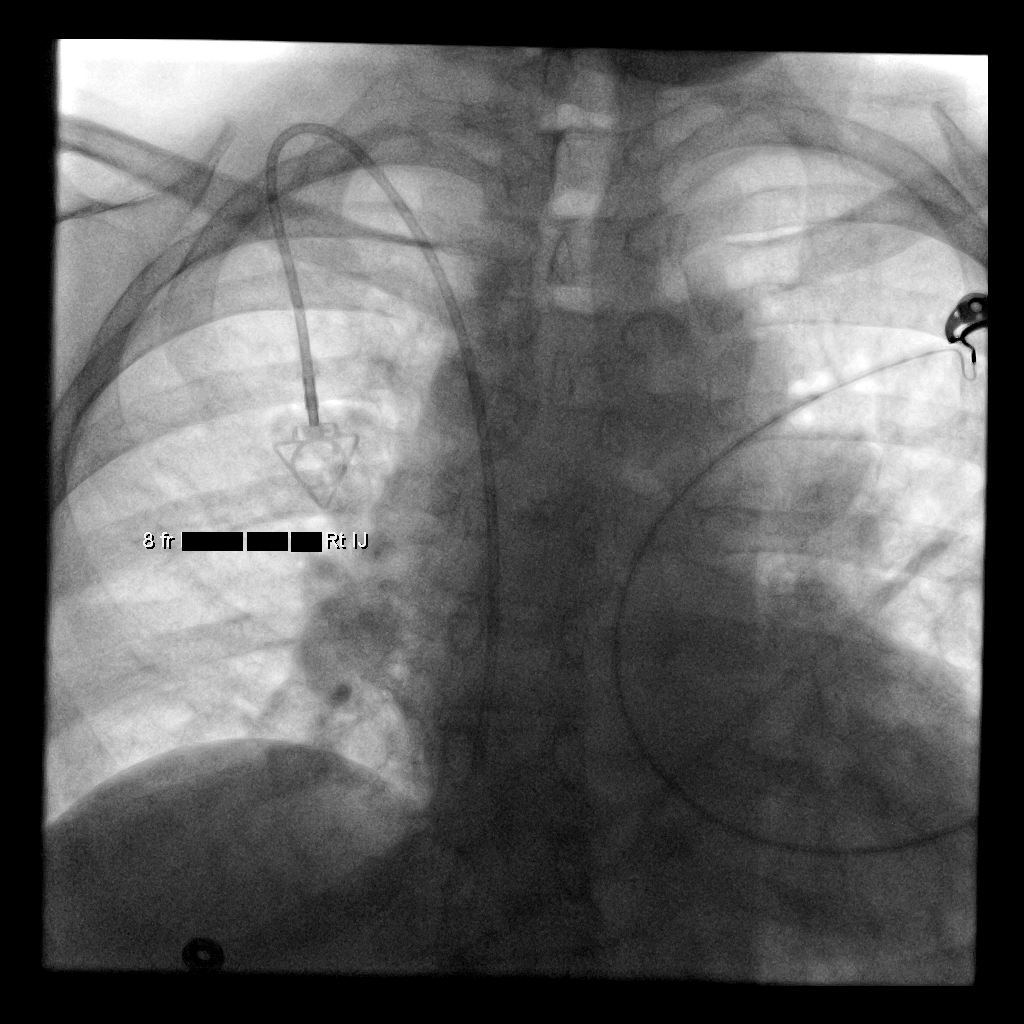

[1 of 1 positions shown; findings below may reference images not displayed]

EXAM:
IMPLANTED PORT A CATH PLACEMENT WITH ULTRASOUND AND FLUOROSCOPIC
GUIDANCE

ANESTHESIA/SEDATION:
4.0 mg IV Versed; 100 mcg IV Fentanyl

Total Moderate Sedation Time:  33 minutes

The patient's level of consciousness and physiologic status were
continuously monitored during the procedure by Radiology nursing.

Additional Medications: 2 g IV Ancef.

FLUOROSCOPY TIME:  24 seconds.  2.1 mGy.

PROCEDURE:
The procedure, risks, benefits, and alternatives were explained to
the patient. Questions regarding the procedure were encouraged and
answered. The patient understands and consents to the procedure. A
time-out was performed prior to initiating the procedure.

Ultrasound was utilized to confirm patency of the right internal
jugular vein. The right neck and chest were prepped with
chlorhexidine in a sterile fashion, and a sterile drape was applied
covering the operative field. Maximum barrier sterile technique with
sterile gowns and gloves were used for the procedure. Local
anesthesia was provided with 1% lidocaine.

After creating a small venotomy incision, a 21 gauge needle was
advanced into the right internal jugular vein under direct,
real-time ultrasound guidance. Ultrasound image documentation was
performed. After securing guidewire access, an 8 Fr dilator was
placed. A J-wire was kinked to measure appropriate catheter length.

A subcutaneous port pocket was then created along the upper chest
wall utilizing sharp and blunt dissection. Portable cautery was
utilized. The pocket was irrigated with sterile saline.

A single lumen power injectable port was chosen for placement. The 8
Fr catheter was tunneled from the port pocket site to the venotomy
incision. The port was placed in the pocket. External catheter was
trimmed to appropriate length based on guidewire measurement.

At the venotomy, an 8 Fr peel-away sheath was placed over a
guidewire. The catheter was then placed through the sheath and the
sheath removed. Final catheter positioning was confirmed and
documented with a fluoroscopic spot image. The port was accessed
with a needle and aspirated and flushed with heparinized saline. The
access needle was removed.

The venotomy and port pocket incisions were closed with subcutaneous
3-0 Monocryl and subcuticular 4-0 Vicryl. Dermabond was applied to
both incisions.

COMPLICATIONS:
COMPLICATIONS
None
FINDINGS: After catheter placement, the tip lies at the Jim junction.
The catheter aspirates normally and is ready for immediate use.
IMPRESSION: Placement of single lumen port a cath via right internal jugular
vein. The catheter tip lies at the Jim junction. A power
injectable port a cath was placed and is ready for immediate use.

## 2019-07-15 ENCOUNTER — Inpatient Hospital Stay: Payer: Medicare Other | Attending: Hematology and Oncology | Admitting: Hematology and Oncology

## 2019-07-15 ENCOUNTER — Inpatient Hospital Stay: Payer: Medicare Other

## 2019-07-15 ENCOUNTER — Other Ambulatory Visit: Payer: Self-pay

## 2019-07-15 ENCOUNTER — Encounter: Payer: Self-pay | Admitting: Hematology and Oncology

## 2019-07-15 ENCOUNTER — Telehealth: Payer: Self-pay | Admitting: Hematology and Oncology

## 2019-07-15 ENCOUNTER — Other Ambulatory Visit: Payer: Self-pay | Admitting: Hematology and Oncology

## 2019-07-15 DIAGNOSIS — C9110 Chronic lymphocytic leukemia of B-cell type not having achieved remission: Secondary | ICD-10-CM

## 2019-07-15 DIAGNOSIS — D696 Thrombocytopenia, unspecified: Secondary | ICD-10-CM

## 2019-07-15 DIAGNOSIS — C4492 Squamous cell carcinoma of skin, unspecified: Secondary | ICD-10-CM | POA: Insufficient documentation

## 2019-07-15 DIAGNOSIS — Z79899 Other long term (current) drug therapy: Secondary | ICD-10-CM | POA: Diagnosis not present

## 2019-07-15 DIAGNOSIS — C44622 Squamous cell carcinoma of skin of right upper limb, including shoulder: Secondary | ICD-10-CM

## 2019-07-15 LAB — CBC WITH DIFFERENTIAL/PLATELET
Abs Immature Granulocytes: 0.04 10*3/uL (ref 0.00–0.07)
Basophils Absolute: 0.1 10*3/uL (ref 0.0–0.1)
Basophils Relative: 1 %
Eosinophils Absolute: 0.2 10*3/uL (ref 0.0–0.5)
Eosinophils Relative: 2 %
HCT: 39.7 % (ref 36.0–46.0)
Hemoglobin: 13.2 g/dL (ref 12.0–15.0)
Immature Granulocytes: 0 %
Lymphocytes Relative: 14 %
Lymphs Abs: 1.3 10*3/uL (ref 0.7–4.0)
MCH: 28 pg (ref 26.0–34.0)
MCHC: 33.2 g/dL (ref 30.0–36.0)
MCV: 84.1 fL (ref 80.0–100.0)
Monocytes Absolute: 0.8 10*3/uL (ref 0.1–1.0)
Monocytes Relative: 9 %
Neutro Abs: 6.7 10*3/uL (ref 1.7–7.7)
Neutrophils Relative %: 74 %
Platelets: 146 10*3/uL — ABNORMAL LOW (ref 150–400)
RBC: 4.72 MIL/uL (ref 3.87–5.11)
RDW: 13.7 % (ref 11.5–15.5)
WBC: 9.2 10*3/uL (ref 4.0–10.5)
nRBC: 0 % (ref 0.0–0.2)

## 2019-07-15 NOTE — Progress Notes (Signed)
[facility] OFFICE PROGRESS NOTE  Patient Care Team: Cari Caraway, MD as PCP - General (Family Medicine) Irine Seal, MD as Attending Physician (Urology) Jacolyn Reedy, MD as Attending Physician (Cardiology) Heath Lark, MD as Consulting Physician (Hematology and Oncology)  ASSESSMENT & PLAN:  CLL (chronic lymphocytic leukemia) Clinically, she have no signs or symptoms to suggest cancer recurrence Her thrombocytopenia is improving We discussed importance of annual influenza vaccination I will see her again in July for further follow-up  Thrombocytopenia [facility]) This is improving She is not symptomatic Observe  Squamous cell cancer of skin of forearm, right She had multiple recurrence of skin cancer We discussed the importance of dermatology follow-up   No orders of the defined types were placed in this encounter.   INTERVAL HISTORY: Please see below for problem oriented charting. She returns for further follow-up She feels well No new lymphadenopathy Denies recent infection, fever or chills No recent bleeding She has close follow-up with dermatologist due to recurrence of skin cancer that were treated locally  SUMMARY OF ONCOLOGIC HISTORY: Oncology History Overview Note  Del 13q   CLL (chronic lymphocytic leukemia) ([place])  02/09/2013 Pathology Results   Peripheral Blood Flow Cytometry - FINDINGS CONSISTENT WITH CHRONIC LYMPHOCYTIC LEUKEMIA   09/02/2013 Pathology Results   FISH positive for deletion 13 q   05/22/2017 Imaging   Worsening mesenteric and retroperitoneal adenopathy concerning for worsening lymphoproliferative disorder/lymphoma.  Small cysts in the liver and kidneys are stable.  Prior cholecystectomy.  Aortic atherosclerosis.  Left colonic diverticulosis without active diverticulitis.   08/11/2017 Imaging   Chest Impression:  1. Mild mediastinal and hilar lymphadenopathy. 2. Clusters small axial lymph nodes.  Abdomen /  Pelvis Impression:  1. Mild periaortic retroperitoneal adenopathy and moderate central mesenteric adenopathy not changed comparison CT. 2. Mild iliac adenopathy 3. Normal volume spleen. 4. No explanation for RIGHT lower quadrant pain. Cluster of lymph nodes in the ileocecal mesenteries similar to prior.   08/12/2017 Procedure   Placement of single lumen port a cath via right internal jugular vein. The catheter tip lies at the cavo-atrial junction. A power injectable port a cath was placed and is ready for immediate use.   08/14/2017 - 11/14/2017 Chemotherapy   She received Bendamustine and Rituxan   11/12/2017 Imaging   1. Continued decrease in mediastinal and hilar lymph nodes with interval resolution of the abdominal and pelvic lymphadenopathy seen on the prior study. There is no lymphadenopathy in the chest, abdomen, or pelvis by CT size criteria on today's study. No new or progressive interval findings. 2. Aortic Atherosclerois (ICD10-170.0) 3. Hepatic and renal cysts, stable.   12/18/2017 Adverse Reaction   We are unable to resume cycle 5 of chemotherapy due to prolonged pancytopenia   01/29/2018 Procedure   Successful right IJ vein Port-A-Cath explant.     REVIEW OF SYSTEMS:   Constitutional: Denies fevers, chills or abnormal weight loss Eyes: Denies blurriness of vision Ears, nose, mouth, throat, and face: Denies mucositis or sore throat Respiratory: Denies cough, dyspnea or wheezes Cardiovascular: Denies palpitation, chest discomfort or lower extremity swelling Gastrointestinal:  Denies nausea, heartburn or change in bowel habits Skin: Denies abnormal skin rashes Lymphatics: Denies new lymphadenopathy or easy bruising Neurological:Denies numbness, tingling or new weaknesses Behavioral/Psych: Mood is stable, no new changes  All other systems were reviewed with the patient and are negative.  I have reviewed the past medical history, past surgical history, social history and  family history with the patient and they  are unchanged from previous note.  ALLERGIES:  is allergic to codeine; demerol [meperidine]; morphine and related; ciprofloxacin; and sulfa antibiotics.  MEDICATIONS:  Current Outpatient Medications  Medication Sig Dispense Refill  . acetaminophen (TYLENOL) 500 MG tablet Take 500 mg by mouth every 6 (six) hours as needed for pain.    Marland Kitchen aspirin 81 MG tablet Take 81 mg by mouth daily.    Marland Kitchen atorvastatin (LIPITOR) 40 MG tablet Take 40 mg by mouth daily.    . Calcium Carb-Cholecalciferol (CALCIUM + D3) 600-200 MG-UNIT TABS Take 1 tablet by mouth 3 (three) times a week.     . digoxin (DIGOX) 0.125 MG tablet Take 1 tablet (0.125 mg total) by mouth daily. 90 tablet 0  . fluticasone (FLONASE) 50 MCG/ACT nasal spray Place 2 sprays into the nose daily.    . folic acid (FOLVITE) 1 MG tablet Take 1 mg by mouth daily.    . hydrochlorothiazide (HYDRODIURIL) 12.5 MG tablet Take 12.5 mg by mouth daily.    Marland Kitchen ibandronate (BONIVA) 150 MG tablet Take 150 mg by mouth every 30 (thirty) days.    . metoprolol (LOPRESSOR) 50 MG tablet Take 50 mg by mouth 2 (two) times daily.    . Multiple Vitamin (MULTIVITAMIN) tablet Take 1 tablet by mouth daily.    . Multiple Vitamins-Minerals (EYE VITAMINS & MINERALS) TABS Take by mouth.    . potassium chloride SA (K-DUR,KLOR-CON) 20 MEQ tablet Take 20 mEq by mouth 3 (three) times a week. Mon, Wed, Fri     No current facility-administered medications for this visit.     PHYSICAL EXAMINATION: ECOG PERFORMANCE STATUS: 0 - Asymptomatic  Vitals:   07/15/19 0911  BP: (!) 129/56  Pulse: (!) 59  Resp: 18  Temp: 98.7 F (37.1 C)  SpO2: 100%   Filed Weights   07/15/19 0911  Weight: 141 lb (64 kg)    GENERAL:alert, no distress and comfortable SKIN: Noted seborrheic keratosis EYES: normal, Conjunctiva are pink and non-injected, sclera clear OROPHARYNX:no exudate, no erythema and lips, buccal mucosa, and tongue normal  NECK: supple,  thyroid normal size, non-tender, without nodularity LYMPH:  no palpable lymphadenopathy in the cervical, axillary or inguinal LUNGS: clear to auscultation and percussion with normal breathing effort HEART: regular rate & rhythm and no murmurs and no lower extremity edema ABDOMEN:abdomen soft, non-tender and normal bowel sounds Musculoskeletal:no cyanosis of digits and no clubbing  NEURO: alert & oriented x 3 with fluent speech, no focal motor/sensory deficits  LABORATORY DATA:  I have reviewed the data as listed    Component Value Date/Time   NA 142 01/12/2019 0938   NA 141 11/13/2017 0753   K 4.1 01/12/2019 0938   K 4.1 11/13/2017 0753   CL 105 01/12/2019 0938   CL 104 02/08/2013 1454   CO2 28 01/12/2019 0938   CO2 27 11/13/2017 0753   GLUCOSE 113 (H) 01/12/2019 0938   GLUCOSE 113 11/13/2017 0753   GLUCOSE 103 (H) 02/08/2013 1454   BUN 14 01/12/2019 0938   BUN 11.6 11/13/2017 0753   CREATININE 0.98 01/12/2019 0938   CREATININE 0.8 11/13/2017 0753   CALCIUM 9.3 01/12/2019 0938   CALCIUM 9.3 11/13/2017 0753   PROT 6.7 01/12/2019 0938   PROT 6.2 (L) 11/13/2017 0753   ALBUMIN 4.0 01/12/2019 0938   ALBUMIN 3.3 (L) 11/13/2017 0753   AST 21 01/12/2019 0938   AST 24 11/13/2017 0753   ALT 25 01/12/2019 0938   ALT 24 11/13/2017 0753   ALKPHOS  67 01/12/2019 0938   ALKPHOS 83 11/13/2017 0753   BILITOT 0.7 01/12/2019 0938   BILITOT 0.38 11/13/2017 0753   GFRNONAA 57 (L) 01/12/2019 0938   GFRAA >60 01/12/2019 0938    No results found for: SPEP, UPEP  Lab Results  Component Value Date   WBC 9.2 07/15/2019   NEUTROABS 6.7 07/15/2019   HGB 13.2 07/15/2019   HCT 39.7 07/15/2019   MCV 84.1 07/15/2019   PLT 146 (L) 07/15/2019      Chemistry      Component Value Date/Time   NA 142 01/12/2019 0938   NA 141 11/13/2017 0753   K 4.1 01/12/2019 0938   K 4.1 11/13/2017 0753   CL 105 01/12/2019 0938   CL 104 02/08/2013 1454   CO2 28 01/12/2019 0938   CO2 27 11/13/2017 0753    BUN 14 01/12/2019 0938   BUN 11.6 11/13/2017 0753   CREATININE 0.98 01/12/2019 0938   CREATININE 0.8 11/13/2017 0753      Component Value Date/Time   CALCIUM 9.3 01/12/2019 0938   CALCIUM 9.3 11/13/2017 0753   ALKPHOS 67 01/12/2019 0938   ALKPHOS 83 11/13/2017 0753   AST 21 01/12/2019 0938   AST 24 11/13/2017 0753   ALT 25 01/12/2019 0938   ALT 24 11/13/2017 0753   BILITOT 0.7 01/12/2019 0938   BILITOT 0.38 11/13/2017 0753     All questions were answered. The patient knows to call the clinic with any problems, questions or concerns. No barriers to learning was detected.  I spent 10 minutes counseling the patient face to face. The total time spent in the appointment was 15 minutes and more than 50% was on counseling and review of test results  Heath Lark, MD 07/15/2019 10:26 AM

## 2019-07-15 NOTE — Assessment & Plan Note (Signed)
Clinically, she have no signs or symptoms to suggest cancer recurrence Her thrombocytopenia is improving We discussed importance of annual influenza vaccination I will see her again in July for further follow-up

## 2019-07-15 NOTE — Telephone Encounter (Signed)
I talk with patient regarding schedule  

## 2019-07-15 NOTE — Assessment & Plan Note (Signed)
This is improving She is not symptomatic Observe

## 2019-07-15 NOTE — Assessment & Plan Note (Signed)
She had multiple recurrence of skin cancer We discussed the importance of dermatology follow-up

## 2019-08-11 ENCOUNTER — Other Ambulatory Visit: Payer: Self-pay | Admitting: Family Medicine

## 2019-08-11 DIAGNOSIS — Z1231 Encounter for screening mammogram for malignant neoplasm of breast: Secondary | ICD-10-CM

## 2019-08-17 DIAGNOSIS — Z85828 Personal history of other malignant neoplasm of skin: Secondary | ICD-10-CM | POA: Diagnosis not present

## 2019-08-17 DIAGNOSIS — Z23 Encounter for immunization: Secondary | ICD-10-CM | POA: Diagnosis not present

## 2019-08-17 DIAGNOSIS — L905 Scar conditions and fibrosis of skin: Secondary | ICD-10-CM | POA: Diagnosis not present

## 2019-08-17 DIAGNOSIS — L821 Other seborrheic keratosis: Secondary | ICD-10-CM | POA: Diagnosis not present

## 2019-08-17 DIAGNOSIS — L565 Disseminated superficial actinic porokeratosis (DSAP): Secondary | ICD-10-CM | POA: Diagnosis not present

## 2019-08-17 DIAGNOSIS — D225 Melanocytic nevi of trunk: Secondary | ICD-10-CM | POA: Diagnosis not present

## 2019-08-17 DIAGNOSIS — D485 Neoplasm of uncertain behavior of skin: Secondary | ICD-10-CM | POA: Diagnosis not present

## 2019-09-28 ENCOUNTER — Other Ambulatory Visit: Payer: Self-pay

## 2019-09-28 ENCOUNTER — Ambulatory Visit
Admission: RE | Admit: 2019-09-28 | Discharge: 2019-09-28 | Disposition: A | Discharge status: Home / Self Care | Payer: Medicare Other | Source: Ambulatory Visit | Attending: Family Medicine | Admitting: Family Medicine

## 2019-09-28 DIAGNOSIS — Z1231 Encounter for screening mammogram for malignant neoplasm of breast: Secondary | ICD-10-CM

## 2019-11-15 DIAGNOSIS — Z23 Encounter for immunization: Secondary | ICD-10-CM | POA: Diagnosis not present

## 2019-12-06 DIAGNOSIS — M85852 Other specified disorders of bone density and structure, left thigh: Secondary | ICD-10-CM | POA: Diagnosis not present

## 2019-12-06 DIAGNOSIS — I480 Paroxysmal atrial fibrillation: Secondary | ICD-10-CM | POA: Diagnosis not present

## 2019-12-06 DIAGNOSIS — Z1389 Encounter for screening for other disorder: Secondary | ICD-10-CM | POA: Diagnosis not present

## 2019-12-06 DIAGNOSIS — C9110 Chronic lymphocytic leukemia of B-cell type not having achieved remission: Secondary | ICD-10-CM | POA: Diagnosis not present

## 2019-12-06 DIAGNOSIS — Z0000 Encounter for general adult medical examination without abnormal findings: Secondary | ICD-10-CM | POA: Diagnosis not present

## 2019-12-06 DIAGNOSIS — D6959 Other secondary thrombocytopenia: Secondary | ICD-10-CM | POA: Diagnosis not present

## 2019-12-06 DIAGNOSIS — I10 Essential (primary) hypertension: Secondary | ICD-10-CM | POA: Diagnosis not present

## 2019-12-06 DIAGNOSIS — E782 Mixed hyperlipidemia: Secondary | ICD-10-CM | POA: Diagnosis not present

## 2019-12-07 DIAGNOSIS — Z7189 Other specified counseling: Secondary | ICD-10-CM | POA: Diagnosis not present

## 2019-12-07 DIAGNOSIS — D6959 Other secondary thrombocytopenia: Secondary | ICD-10-CM | POA: Diagnosis not present

## 2019-12-07 DIAGNOSIS — C9110 Chronic lymphocytic leukemia of B-cell type not having achieved remission: Secondary | ICD-10-CM | POA: Diagnosis not present

## 2019-12-07 DIAGNOSIS — E782 Mixed hyperlipidemia: Secondary | ICD-10-CM | POA: Diagnosis not present

## 2019-12-07 DIAGNOSIS — Z800 Family history of malignant neoplasm of digestive organs: Secondary | ICD-10-CM | POA: Diagnosis not present

## 2019-12-07 DIAGNOSIS — M85852 Other specified disorders of bone density and structure, left thigh: Secondary | ICD-10-CM | POA: Diagnosis not present

## 2019-12-07 DIAGNOSIS — J309 Allergic rhinitis, unspecified: Secondary | ICD-10-CM | POA: Diagnosis not present

## 2019-12-07 DIAGNOSIS — I480 Paroxysmal atrial fibrillation: Secondary | ICD-10-CM | POA: Diagnosis not present

## 2019-12-07 DIAGNOSIS — C9191 Lymphoid leukemia, unspecified, in remission: Secondary | ICD-10-CM | POA: Diagnosis not present

## 2019-12-07 DIAGNOSIS — Z79899 Other long term (current) drug therapy: Secondary | ICD-10-CM | POA: Diagnosis not present

## 2019-12-07 DIAGNOSIS — Z0000 Encounter for general adult medical examination without abnormal findings: Secondary | ICD-10-CM | POA: Diagnosis not present

## 2019-12-07 DIAGNOSIS — I10 Essential (primary) hypertension: Secondary | ICD-10-CM | POA: Diagnosis not present

## 2019-12-08 ENCOUNTER — Other Ambulatory Visit: Payer: Self-pay | Admitting: Family Medicine

## 2019-12-08 DIAGNOSIS — M85852 Other specified disorders of bone density and structure, left thigh: Secondary | ICD-10-CM

## 2019-12-10 DIAGNOSIS — E782 Mixed hyperlipidemia: Secondary | ICD-10-CM | POA: Diagnosis not present

## 2019-12-10 DIAGNOSIS — I480 Paroxysmal atrial fibrillation: Secondary | ICD-10-CM | POA: Diagnosis not present

## 2019-12-10 DIAGNOSIS — I10 Essential (primary) hypertension: Secondary | ICD-10-CM | POA: Diagnosis not present

## 2019-12-13 DIAGNOSIS — Z23 Encounter for immunization: Secondary | ICD-10-CM | POA: Diagnosis not present

## 2020-01-25 DIAGNOSIS — H04123 Dry eye syndrome of bilateral lacrimal glands: Secondary | ICD-10-CM | POA: Diagnosis not present

## 2020-01-25 DIAGNOSIS — H0102A Squamous blepharitis right eye, upper and lower eyelids: Secondary | ICD-10-CM | POA: Diagnosis not present

## 2020-01-25 DIAGNOSIS — H43813 Vitreous degeneration, bilateral: Secondary | ICD-10-CM | POA: Diagnosis not present

## 2020-01-25 DIAGNOSIS — H353132 Nonexudative age-related macular degeneration, bilateral, intermediate dry stage: Secondary | ICD-10-CM | POA: Diagnosis not present

## 2020-01-25 DIAGNOSIS — H0102B Squamous blepharitis left eye, upper and lower eyelids: Secondary | ICD-10-CM | POA: Diagnosis not present

## 2020-01-25 DIAGNOSIS — Z961 Presence of intraocular lens: Secondary | ICD-10-CM | POA: Diagnosis not present

## 2020-05-18 DIAGNOSIS — I10 Essential (primary) hypertension: Secondary | ICD-10-CM | POA: Diagnosis not present

## 2020-05-18 DIAGNOSIS — E782 Mixed hyperlipidemia: Secondary | ICD-10-CM | POA: Diagnosis not present

## 2020-05-18 DIAGNOSIS — I480 Paroxysmal atrial fibrillation: Secondary | ICD-10-CM | POA: Diagnosis not present

## 2020-05-24 ENCOUNTER — Other Ambulatory Visit: Payer: Self-pay | Admitting: Hematology and Oncology

## 2020-05-24 DIAGNOSIS — C9110 Chronic lymphocytic leukemia of B-cell type not having achieved remission: Secondary | ICD-10-CM

## 2020-05-25 ENCOUNTER — Other Ambulatory Visit: Payer: Self-pay

## 2020-05-25 ENCOUNTER — Inpatient Hospital Stay: Payer: Medicare Other

## 2020-05-25 ENCOUNTER — Encounter: Payer: Self-pay | Admitting: Hematology and Oncology

## 2020-05-25 ENCOUNTER — Telehealth: Payer: Self-pay | Admitting: Hematology and Oncology

## 2020-05-25 ENCOUNTER — Inpatient Hospital Stay: Payer: Medicare Other | Attending: Hematology and Oncology | Admitting: Hematology and Oncology

## 2020-05-25 DIAGNOSIS — C9110 Chronic lymphocytic leukemia of B-cell type not having achieved remission: Secondary | ICD-10-CM | POA: Diagnosis not present

## 2020-05-25 DIAGNOSIS — Z79899 Other long term (current) drug therapy: Secondary | ICD-10-CM | POA: Diagnosis not present

## 2020-05-25 DIAGNOSIS — Z7982 Long term (current) use of aspirin: Secondary | ICD-10-CM | POA: Insufficient documentation

## 2020-05-25 LAB — CBC WITH DIFFERENTIAL/PLATELET
Abs Immature Granulocytes: 0.05 10*3/uL (ref 0.00–0.07)
Basophils Absolute: 0.1 10*3/uL (ref 0.0–0.1)
Basophils Relative: 1 %
Eosinophils Absolute: 0.2 10*3/uL (ref 0.0–0.5)
Eosinophils Relative: 3 %
HCT: 39.8 % (ref 36.0–46.0)
Hemoglobin: 13.3 g/dL (ref 12.0–15.0)
Immature Granulocytes: 1 %
Lymphocytes Relative: 29 %
Lymphs Abs: 2.1 10*3/uL (ref 0.7–4.0)
MCH: 28.1 pg (ref 26.0–34.0)
MCHC: 33.4 g/dL (ref 30.0–36.0)
MCV: 84 fL (ref 80.0–100.0)
Monocytes Absolute: 0.7 10*3/uL (ref 0.1–1.0)
Monocytes Relative: 10 %
Neutro Abs: 4.1 10*3/uL (ref 1.7–7.7)
Neutrophils Relative %: 56 %
Platelets: 156 10*3/uL (ref 150–400)
RBC: 4.74 MIL/uL (ref 3.87–5.11)
RDW: 13.6 % (ref 11.5–15.5)
WBC: 7.3 10*3/uL (ref 4.0–10.5)
nRBC: 0 % (ref 0.0–0.2)

## 2020-05-25 NOTE — Assessment & Plan Note (Signed)
Clinically, she have no signs or symptoms to suggest cancer recurrence Her CBC is completely normal We discussed importance of annual influenza vaccination She is at risk of skin cancer and I recommend aggressive skin protection and close follow-up with dermatologist I will see her again in July for further follow-up

## 2020-05-25 NOTE — Telephone Encounter (Signed)
Scheduled appts per 7/15 sch msg. Gave pt a print out of AVS.  

## 2020-05-25 NOTE — Progress Notes (Signed)
[place] OFFICE PROGRESS NOTE  Patient Care Team: Cari Caraway, MD as PCP - General (Family Medicine) Irine Seal, MD as Attending Physician (Urology) Jacolyn Reedy, MD as Attending Physician (Cardiology) Heath Lark, MD as Consulting Physician (Hematology and Oncology)  ASSESSMENT & PLAN:  CLL (chronic lymphocytic leukemia) Clinically, she have no signs or symptoms to suggest cancer recurrence Her CBC is completely normal We discussed importance of annual influenza vaccination She is at risk of skin cancer and I recommend aggressive skin protection and close follow-up with dermatologist I will see her again in July for further follow-up   No orders of the defined types were placed in this encounter.   All questions were answered. The patient knows to call the clinic with any problems, questions or concerns. The total time spent in the appointment was 15 minutes encounter with patients including review of chart and various tests results, discussions about plan of care and coordination of care plan   Heath Lark, MD 05/25/2020 12:10 PM  INTERVAL HISTORY: Please see below for problem oriented charting. She returns for chemotherapy and follow-up No new lymphadenopathy Denies recent infection, fever or chills Appetite is stable  SUMMARY OF ONCOLOGIC HISTORY: Oncology History Overview Note  Del 13q   CLL (chronic lymphocytic leukemia) ([place])  02/09/2013 Pathology Results   Peripheral Blood Flow Cytometry - FINDINGS CONSISTENT WITH CHRONIC LYMPHOCYTIC LEUKEMIA   09/02/2013 Pathology Results   FISH positive for deletion 13 q   05/22/2017 Imaging   Worsening mesenteric and retroperitoneal adenopathy concerning for worsening lymphoproliferative disorder/lymphoma.  Small cysts in the liver and kidneys are stable.  Prior cholecystectomy.  Aortic atherosclerosis.  Left colonic diverticulosis without active diverticulitis.   08/11/2017 Imaging   Chest  Impression:  1. Mild mediastinal and hilar lymphadenopathy. 2. Clusters small axial lymph nodes.  Abdomen / Pelvis Impression:  1. Mild periaortic retroperitoneal adenopathy and moderate central mesenteric adenopathy not changed comparison CT. 2. Mild iliac adenopathy 3. Normal volume spleen. 4. No explanation for RIGHT lower quadrant pain. Cluster of lymph nodes in the ileocecal mesenteries similar to prior.   08/12/2017 Procedure   Placement of single lumen port a cath via right internal jugular vein. The catheter tip lies at the cavo-atrial junction. A power injectable port a cath was placed and is ready for immediate use.   08/14/2017 - 11/14/2017 Chemotherapy   She received Bendamustine and Rituxan   11/12/2017 Imaging   1. Continued decrease in mediastinal and hilar lymph nodes with interval resolution of the abdominal and pelvic lymphadenopathy seen on the prior study. There is no lymphadenopathy in the chest, abdomen, or pelvis by CT size criteria on today's study. No new or progressive interval findings. 2. Aortic Atherosclerois (ICD10-170.0) 3. Hepatic and renal cysts, stable.   12/18/2017 Adverse Reaction   We are unable to resume cycle 5 of chemotherapy due to prolonged pancytopenia   01/29/2018 Procedure   Successful right IJ vein Port-A-Cath explant.     REVIEW OF SYSTEMS:   Constitutional: Denies fevers, chills or abnormal weight loss Eyes: Denies blurriness of vision Ears, nose, mouth, throat, and face: Denies mucositis or sore throat Respiratory: Denies cough, dyspnea or wheezes Cardiovascular: Denies palpitation, chest discomfort or lower extremity swelling Gastrointestinal:  Denies nausea, heartburn or change in bowel habits Skin: Denies abnormal skin rashes Lymphatics: Denies new lymphadenopathy or easy bruising Neurological:Denies numbness, tingling or new weaknesses Behavioral/Psych: Mood is stable, no new changes  All other systems were reviewed with the  patient  and are negative.  I have reviewed the past medical history, past surgical history, social history and family history with the patient and they are unchanged from previous note.  ALLERGIES:  is allergic to codeine, demerol [meperidine], morphine and related, ciprofloxacin, and sulfa antibiotics.  MEDICATIONS:  Current Outpatient Medications  Medication Sig Dispense Refill  . amLODipine (NORVASC) 5 MG tablet Take 5 mg by mouth daily.    Marland Kitchen acetaminophen (TYLENOL) 500 MG tablet Take 500 mg by mouth every 6 (six) hours as needed for pain.    Marland Kitchen aspirin 81 MG tablet Take 81 mg by mouth daily.    Marland Kitchen atorvastatin (LIPITOR) 40 MG tablet Take 40 mg by mouth daily.    . Calcium Carb-Cholecalciferol (CALCIUM + D3) 600-200 MG-UNIT TABS Take 1 tablet by mouth 3 (three) times a week.     . digoxin (DIGOX) 0.125 MG tablet Take 1 tablet (0.125 mg total) by mouth daily. 90 tablet 0  . fluticasone (FLONASE) 50 MCG/ACT nasal spray Place 2 sprays into the nose daily.    . folic acid (FOLVITE) 1 MG tablet Take 1 mg by mouth daily.    . hydrochlorothiazide (HYDRODIURIL) 12.5 MG tablet Take 12.5 mg by mouth daily.    Marland Kitchen ibandronate (BONIVA) 150 MG tablet Take 150 mg by mouth every 30 (thirty) days.    . metoprolol (LOPRESSOR) 50 MG tablet Take 50 mg by mouth 2 (two) times daily.    . Multiple Vitamin (MULTIVITAMIN) tablet Take 1 tablet by mouth daily.    . Multiple Vitamins-Minerals (EYE VITAMINS & MINERALS) TABS Take by mouth.    . potassium chloride SA (K-DUR,KLOR-CON) 20 MEQ tablet Take 20 mEq by mouth 3 (three) times a week. Mon, Wed, Fri     No current facility-administered medications for this visit.    PHYSICAL EXAMINATION: ECOG PERFORMANCE STATUS: 0 - Asymptomatic  Vitals:   05/25/20 1157  BP: (!) 142/55  Pulse: (!) 56  Resp: 18  Temp: 98.4 F (36.9 C)  SpO2: 100%   Filed Weights   05/25/20 1157  Weight: 143 lb 3.2 oz (65 kg)    GENERAL:alert, no distress and comfortable NEURO:  alert & oriented x 3 with fluent speech, no focal motor/sensory deficits  LABORATORY DATA:  I have reviewed the data as listed    Component Value Date/Time   NA 142 01/12/2019 0938   NA 141 11/13/2017 0753   K 4.1 01/12/2019 0938   K 4.1 11/13/2017 0753   CL 105 01/12/2019 0938   CL 104 02/08/2013 1454   CO2 28 01/12/2019 0938   CO2 27 11/13/2017 0753   GLUCOSE 113 (H) 01/12/2019 0938   GLUCOSE 113 11/13/2017 0753   GLUCOSE 103 (H) 02/08/2013 1454   BUN 14 01/12/2019 0938   BUN 11.6 11/13/2017 0753   CREATININE 0.98 01/12/2019 0938   CREATININE 0.8 11/13/2017 0753   CALCIUM 9.3 01/12/2019 0938   CALCIUM 9.3 11/13/2017 0753   PROT 6.7 01/12/2019 0938   PROT 6.2 (L) 11/13/2017 0753   ALBUMIN 4.0 01/12/2019 0938   ALBUMIN 3.3 (L) 11/13/2017 0753   AST 21 01/12/2019 0938   AST 24 11/13/2017 0753   ALT 25 01/12/2019 0938   ALT 24 11/13/2017 0753   ALKPHOS 67 01/12/2019 0938   ALKPHOS 83 11/13/2017 0753   BILITOT 0.7 01/12/2019 0938   BILITOT 0.38 11/13/2017 0753   GFRNONAA 57 (L) 01/12/2019 0938   GFRAA >60 01/12/2019 0938    No results found for: SPEP,  UPEP  Lab Results  Component Value Date   WBC 7.3 05/25/2020   NEUTROABS 4.1 05/25/2020   HGB 13.3 05/25/2020   HCT 39.8 05/25/2020   MCV 84.0 05/25/2020   PLT 156 05/25/2020      Chemistry      Component Value Date/Time   NA 142 01/12/2019 0938   NA 141 11/13/2017 0753   K 4.1 01/12/2019 0938   K 4.1 11/13/2017 0753   CL 105 01/12/2019 0938   CL 104 02/08/2013 1454   CO2 28 01/12/2019 0938   CO2 27 11/13/2017 0753   BUN 14 01/12/2019 0938   BUN 11.6 11/13/2017 0753   CREATININE 0.98 01/12/2019 0938   CREATININE 0.8 11/13/2017 0753      Component Value Date/Time   CALCIUM 9.3 01/12/2019 0938   CALCIUM 9.3 11/13/2017 0753   ALKPHOS 67 01/12/2019 0938   ALKPHOS 83 11/13/2017 0753   AST 21 01/12/2019 0938   AST 24 11/13/2017 0753   ALT 25 01/12/2019 0938   ALT 24 11/13/2017 0753   BILITOT 0.7  01/12/2019 0938   BILITOT 0.38 11/13/2017 0753

## 2020-05-29 DIAGNOSIS — I480 Paroxysmal atrial fibrillation: Secondary | ICD-10-CM | POA: Diagnosis not present

## 2020-05-29 DIAGNOSIS — J309 Allergic rhinitis, unspecified: Secondary | ICD-10-CM | POA: Diagnosis not present

## 2020-05-29 DIAGNOSIS — C9110 Chronic lymphocytic leukemia of B-cell type not having achieved remission: Secondary | ICD-10-CM | POA: Diagnosis not present

## 2020-05-29 DIAGNOSIS — M85852 Other specified disorders of bone density and structure, left thigh: Secondary | ICD-10-CM | POA: Diagnosis not present

## 2020-05-29 DIAGNOSIS — E782 Mixed hyperlipidemia: Secondary | ICD-10-CM | POA: Diagnosis not present

## 2020-05-29 DIAGNOSIS — I10 Essential (primary) hypertension: Secondary | ICD-10-CM | POA: Diagnosis not present

## 2020-05-31 ENCOUNTER — Other Ambulatory Visit: Payer: Medicare Other

## 2020-06-26 DIAGNOSIS — L97521 Non-pressure chronic ulcer of other part of left foot limited to breakdown of skin: Secondary | ICD-10-CM | POA: Diagnosis not present

## 2020-07-04 NOTE — Progress Notes (Signed)
Cardiology Office Note:   Date:  07/06/2020  NAME:  Amanda [name]    MRN: [id number] DOB:  Jul 12, 1946   PCP:  Cari Caraway, MD  Cardiologist:  No primary care provider on file.   Referring MD: Cari Caraway, MD   Chief Complaint  Patient presents with  . Atrial Fibrillation   History of Present Illness:   Amanda [name] is a 74 y.o. female with a hx of paroxysmal atrial fibrillation, CLL who is being seen today for the evaluation of paroxysmal defibrillation at the request of Cari Caraway, MD.  She was admitted to the hospital in 2012 with arrhythmia.  Is reported that she had atrial fibrillation and converted back to sinus rhythm.  Has been seen by Dr. Wynonia Lawman before.  She reports she is had no recurrence of her atrial fibrillation in nearly 10 years.  She is on digoxin and metoprolol.  Her EKG today demonstrates normal sinus rhythm.  She was started on Eliquis by her primary care physician.  I think this is okay for now.  Since she is had no further documentation of A. fib she may be able to forego anticoagulation.  For now she opts to continue it.  She reports no chest pain, shortness of breath or palpitations.  She reports that she can walk 2 miles up to 4 days/week without any limitations.  She also does water exercises routinely.  Most recent lipid profile shows a total cholesterol 122, HDL 34, LDL 49, triglycerides 249.  Kidney function stable with a creatinine of 0.93.  She does have CLL and is on treatment for this.  No issues with her medications for this.  Overall doing well without complaints.  Problem List 1. CLL 2. Paroxysmal Afib  -Diagnosed 2012.  Converted back to normal rhythm during that admission.  No further episodes.  Past Medical History: Past Medical History:  Diagnosis Date  . A-fib ([place])   . Adenopathy   . CLL (chronic lymphocytic leukemia) ([place]) 08/31/2013  . HTN (hypertension)   . Hyperlipidemia   . Lymphocytosis     Past Surgical History: Past  Surgical History:  Procedure Laterality Date  . ABDOMINAL HYSTERECTOMY     endometriosis, fibroid  . APPENDECTOMY    . BREAST BIOPSY     benign cyst.  . CHOLECYSTECTOMY    . COLONOSCOPY  11/2012   Per Dr. Cristina Gong.  neg.   . IR FLUORO GUIDE PORT INSERTION RIGHT  08/12/2017  . IR REMOVAL TUN ACCESS W/ PORT W/O FL MOD SED  01/29/2018  . IR US GUIDE VASC ACCESS RIGHT  08/12/2017  . THUMB ARTHROSCOPY    . TOTAL HIP ARTHROPLASTY  2010    Current Medications: Current Meds  Medication Sig  . acetaminophen (TYLENOL) 500 MG tablet Take 500 mg by mouth every 6 (six) hours as needed for pain.  Marland Kitchen amLODipine (NORVASC) 5 MG tablet Take 5 mg by mouth daily.  Marland Kitchen atorvastatin (LIPITOR) 40 MG tablet Take 40 mg by mouth daily.  . Calcium Carb-Cholecalciferol (CALCIUM + D3) 600-200 MG-UNIT TABS Take 1 tablet by mouth 3 (three) times a week.   . digoxin (DIGOX) 0.125 MG tablet Take 1 tablet (0.125 mg total) by mouth daily.  . fluticasone (FLONASE) 50 MCG/ACT nasal spray Place 2 sprays into the nose daily.  . folic acid (FOLVITE) 1 MG tablet Take 1 mg by mouth daily.  . hydrochlorothiazide (HYDRODIURIL) 12.5 MG tablet Take 12.5 mg by mouth daily.  Marland Kitchen ibandronate (BONIVA) 150  MG tablet Take 150 mg by mouth every 30 (thirty) days.  . metoprolol (LOPRESSOR) 50 MG tablet Take 50 mg by mouth 2 (two) times daily.  . Multiple Vitamin (MULTIVITAMIN) tablet Take 1 tablet by mouth daily.  . Multiple Vitamins-Minerals (EYE VITAMINS & MINERALS) TABS Take by mouth.  . potassium chloride SA (K-DUR,KLOR-CON) 20 MEQ tablet Take 20 mEq by mouth 3 (three) times a week. Mon, Wed, Fri  . [DISCONTINUED] aspirin 81 MG tablet Take 81 mg by mouth daily.     Allergies:    Codeine, Demerol [meperidine], Morphine and related, Ciprofloxacin, and Sulfa antibiotics   Social History: Social History   Socioeconomic History  . Marital status: Widowed    Spouse name: Not on file  . Number of children: 2  . Years of education: Not on  file  . Highest education level: Not on file  Occupational History  . Occupation: retired Marine scientist    Comment: retired [name]; med surgery.   Tobacco Use  . Smoking status: Never Smoker  . Smokeless tobacco: Never Used  Substance and Sexual Activity  . Alcohol use: No  . Drug use: No  . Sexual activity: Not on file  Other Topics Concern  . 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 Difficulty of Paying Living Expenses: Not on file  Food Insecurity:   . Worried About Charity fundraiser in the Last Year: Not on file  . Ran Out of Food in the Last Year: Not on file  Transportation Needs:   . Lack of Transportation (Medical): Not on file  . Lack of Transportation (Non-Medical): Not on file  Physical Activity:   . Days of Exercise per Week: Not on file  . Minutes of Exercise per Session: Not on file  Stress:   . Feeling of Stress : Not on file  Social Connections:   . Frequency of Communication with Friends and Family: Not on file  . Frequency of Social Gatherings with Friends and Family: Not on file  . Attends Religious Services: Not on file  . Active Member of Clubs or Organizations: Not on file  . Attends Archivist Meetings: Not on file  . Marital Status: Not on file     Family History: The patient's family history includes Cancer in her father and mother; Renal Disease in her mother.  ROS:   All other ROS reviewed and negative. Pertinent positives noted in the HPI.     EKGs/Labs/Other Studies Reviewed:   The following studies were personally reviewed by me today:  EKG:  EKG is ordered today.  The ekg ordered today demonstrates sinus bradycardia, heart rate 52, no acute ischemic changes, no evidence for infarction, and was personally reviewed by me.   Recent Labs: 05/25/2020: Hemoglobin 13.3; Platelets 156   Recent Lipid Panel    Component Value Date/Time   CHOL   01/02/2009 0212    143        ATP III CLASSIFICATION:  <200     mg/dL   Desirable  200-239  mg/dL   Borderline High  >=240    mg/dL   High          TRIG 201 (H) 01/02/2009 0212   HDL 45 01/02/2009 0212   CHOLHDL 3.2 01/02/2009 0212   VLDL 40 01/02/2009 0212   LDLCALC  01/02/2009 0212    58  Total Cholesterol/HDL:CHD Risk Coronary Heart Disease Risk Table                     Men   Women  1/2 Average Risk   3.4   3.3  Average Risk       5.0   4.4  2 X Average Risk   9.6   7.1  3 X Average Risk  23.4   11.0        Use the calculated Patient Ratio above and the CHD Risk Table to determine the patient's CHD Risk.        ATP III CLASSIFICATION (LDL):  <100     mg/dL   Optimal  100-129  mg/dL   Near or Above                    Optimal  130-159  mg/dL   Borderline  160-189  mg/dL   High  >190     mg/dL   Very High    Physical Exam:   VS:  BP 128/84 (BP Location: Left Arm, Patient Position: Sitting, Cuff Size: Normal)   Pulse (!) 52   Ht 5\' 2"  (1.575 m)   Wt 141 lb 12.8 oz (64.3 kg)   SpO2 98%   BMI 25.94 kg/m    Wt Readings from Last 3 Encounters:  07/06/20 141 lb 12.8 oz (64.3 kg)  05/25/20 143 lb 3.2 oz (65 kg)  07/15/19 141 lb (64 kg)    General: Well nourished, well developed, in no acute distress Heart: Atraumatic, normal size  Eyes: PEERLA, EOMI  Neck: Supple, no JVD Endocrine: No thryomegaly Cardiac: Normal S1, S2; RRR; no murmurs, rubs, or gallops Lungs: Clear to auscultation bilaterally, no wheezing, rhonchi or rales  Abd: Soft, nontender, no hepatomegaly  Ext: No edema, pulses 2+ Musculoskeletal: No deformities, BUE and BLE strength normal and equal Skin: Warm and dry, no rashes   Neuro: Alert and oriented to person, place, time, and situation, CNII-XII grossly intact, no focal deficits  Psych: Normal mood and affect   ASSESSMENT:   Amanda [name] is a 74 y.o. female who presents for the following: 1. Paroxysmal atrial fibrillation (HCC)      PLAN:   1. Paroxysmal atrial fibrillation (HCC) -In normal sinus rhythm.  No recurrence in 10 years.  She reports that when she stopped her digoxin in the past she had recurrence.  She wants to continue this.  I did inform her that she should have her kidney function monitored periodically and she will do this. -Her chads vas score is 7 (age, sex, hypertension).  Given that she has had no recurrence of A. fib I did discuss with her about pursuing a monitor to exclude any A. fib and then possibly stopping anticoagulation.  For now she opts to remain anticoagulation.  This is reasonable.  She will continue Eliquis 5 mg twice daily. -We will see her on a yearly basis.  Disposition: Return in about 1 year (around 07/06/2021).  Medication Adjustments/Labs and Tests Ordered: Current medicines are reviewed at length with the patient today.  Concerns regarding medicines are outlined above.  Orders Placed This Encounter  Procedures  . EKG 12-Lead   No orders of the defined types were placed in this encounter.   Patient Instructions  Medication Instructions:  The current medical regimen is effective;  continue present plan and medications.  *If you need a refill on your cardiac medications before your next  appointment, please call your pharmacy*   Follow-Up: At [facility], you and your health needs are our priority.  As part of our continuing mission to provide you with exceptional heart care, we have created designated Provider Care Teams.  These Care Teams include your primary Cardiologist (physician) and Advanced Practice Providers (APPs -  Physician Assistants and Nurse Practitioners) who all work together to provide you with the care you need, when you need it.  We recommend signing up for the patient portal called "MyChart".  Sign up information is provided on this After Visit Summary.  MyChart is used to connect with patients for Virtual Visits (Telemedicine).  Patients are able to  view lab/test results, encounter notes, upcoming appointments, etc.  Non-urgent messages can be sent to your provider as well.   To learn more about what you can do with MyChart, go to [email or web address].    Your next appointment:   12 month(s)  The format for your next appointment:   In Person  Provider:   Eleonore Chiquito, MD       Signed, Addison Naegeli. Audie Box, [place]  [address] [place], [place] [address] [phone]  07/06/2020 11:11 AM

## 2020-07-06 ENCOUNTER — Ambulatory Visit (INDEPENDENT_AMBULATORY_CARE_PROVIDER_SITE_OTHER): Payer: Medicare Other | Admitting: Cardiovascular Disease

## 2020-07-06 ENCOUNTER — Encounter: Payer: Self-pay | Admitting: Cardiovascular Disease

## 2020-07-06 ENCOUNTER — Other Ambulatory Visit: Payer: Self-pay

## 2020-07-06 VITALS — BP 128/84 | HR 52 | Ht 62.0 in | Wt 141.8 lb

## 2020-07-06 DIAGNOSIS — I480 Paroxysmal atrial fibrillation: Secondary | ICD-10-CM | POA: Diagnosis not present

## 2020-07-06 NOTE — Patient Instructions (Signed)

## 2020-07-10 DIAGNOSIS — M71571 Other bursitis, not elsewhere classified, right ankle and foot: Secondary | ICD-10-CM | POA: Diagnosis not present

## 2020-07-10 DIAGNOSIS — M71572 Other bursitis, not elsewhere classified, left ankle and foot: Secondary | ICD-10-CM | POA: Diagnosis not present

## 2020-07-18 DIAGNOSIS — E782 Mixed hyperlipidemia: Secondary | ICD-10-CM | POA: Diagnosis not present

## 2020-07-18 DIAGNOSIS — I480 Paroxysmal atrial fibrillation: Secondary | ICD-10-CM | POA: Diagnosis not present

## 2020-07-18 DIAGNOSIS — I10 Essential (primary) hypertension: Secondary | ICD-10-CM | POA: Diagnosis not present

## 2020-08-23 DIAGNOSIS — L821 Other seborrheic keratosis: Secondary | ICD-10-CM | POA: Diagnosis not present

## 2020-08-23 DIAGNOSIS — L565 Disseminated superficial actinic porokeratosis (DSAP): Secondary | ICD-10-CM | POA: Diagnosis not present

## 2020-08-23 DIAGNOSIS — L570 Actinic keratosis: Secondary | ICD-10-CM | POA: Diagnosis not present

## 2020-08-23 DIAGNOSIS — L578 Other skin changes due to chronic exposure to nonionizing radiation: Secondary | ICD-10-CM | POA: Diagnosis not present

## 2020-08-23 DIAGNOSIS — Z85828 Personal history of other malignant neoplasm of skin: Secondary | ICD-10-CM | POA: Diagnosis not present

## 2020-08-23 DIAGNOSIS — D225 Melanocytic nevi of trunk: Secondary | ICD-10-CM | POA: Diagnosis not present

## 2020-08-23 DIAGNOSIS — L918 Other hypertrophic disorders of the skin: Secondary | ICD-10-CM | POA: Diagnosis not present

## 2020-08-24 ENCOUNTER — Other Ambulatory Visit: Payer: Self-pay

## 2020-08-24 ENCOUNTER — Ambulatory Visit
Admission: RE | Admit: 2020-08-24 | Discharge: 2020-08-24 | Disposition: A | Discharge status: Home / Self Care | Payer: Medicare Other | Source: Ambulatory Visit | Attending: Family Medicine | Admitting: Family Medicine

## 2020-08-24 DIAGNOSIS — Z23 Encounter for immunization: Secondary | ICD-10-CM | POA: Diagnosis not present

## 2020-08-24 DIAGNOSIS — M8589 Other specified disorders of bone density and structure, multiple sites: Secondary | ICD-10-CM | POA: Diagnosis not present

## 2020-08-24 DIAGNOSIS — Z780 Asymptomatic menopausal state: Secondary | ICD-10-CM | POA: Diagnosis not present

## 2020-08-24 DIAGNOSIS — M85852 Other specified disorders of bone density and structure, left thigh: Secondary | ICD-10-CM

## 2020-09-25 DIAGNOSIS — Z23 Encounter for immunization: Secondary | ICD-10-CM | POA: Diagnosis not present

## 2020-09-26 DIAGNOSIS — H353221 Exudative age-related macular degeneration, left eye, with active choroidal neovascularization: Secondary | ICD-10-CM | POA: Diagnosis not present

## 2020-09-26 DIAGNOSIS — Z961 Presence of intraocular lens: Secondary | ICD-10-CM | POA: Diagnosis not present

## 2020-09-26 DIAGNOSIS — H04123 Dry eye syndrome of bilateral lacrimal glands: Secondary | ICD-10-CM | POA: Diagnosis not present

## 2020-09-27 ENCOUNTER — Ambulatory Visit (INDEPENDENT_AMBULATORY_CARE_PROVIDER_SITE_OTHER): Payer: Medicare Other | Admitting: Ophthalmology

## 2020-09-27 ENCOUNTER — Encounter (INDEPENDENT_AMBULATORY_CARE_PROVIDER_SITE_OTHER): Payer: Self-pay | Admitting: Ophthalmology

## 2020-09-27 ENCOUNTER — Other Ambulatory Visit: Payer: Self-pay

## 2020-09-27 DIAGNOSIS — I10 Essential (primary) hypertension: Secondary | ICD-10-CM

## 2020-09-27 DIAGNOSIS — H353221 Exudative age-related macular degeneration, left eye, with active choroidal neovascularization: Secondary | ICD-10-CM

## 2020-09-27 DIAGNOSIS — H35033 Hypertensive retinopathy, bilateral: Secondary | ICD-10-CM

## 2020-09-27 DIAGNOSIS — H353132 Nonexudative age-related macular degeneration, bilateral, intermediate dry stage: Secondary | ICD-10-CM

## 2020-09-27 DIAGNOSIS — H3581 Retinal edema: Secondary | ICD-10-CM

## 2020-09-27 DIAGNOSIS — Z961 Presence of intraocular lens: Secondary | ICD-10-CM

## 2020-09-27 MED ORDER — BEVACIZUMAB CHEMO INJECTION 1.25MG/0.05ML SYRINGE FOR KALEIDOSCOPE
1.2500 mg | INTRAVITREAL | Status: AC | PRN
  Administered 2020-09-27: 1.25 mg via INTRAVITREAL

## 2020-09-27 NOTE — Progress Notes (Signed)
[facility] Note  09/27/2020     CHIEF COMPLAINT Patient presents for Retina Evaluation   HISTORY OF PRESENT ILLNESS: Amanda [name] is a 74 y.o. female who presents to the clinic today for:   HPI    Retina Evaluation    In left eye.  This started 6 days ago.  Duration of 6 days.  Associated Symptoms Distortion.  Context:  distance vision, mid-range vision and near vision.  Treatments tried include artificial tears.  Response to treatment was no improvement.  I, the attending physician,  performed the HPI with the patient and updated documentation appropriately.          Comments    74 y/o female pt referred by Shirleen Schirmer, PA on 11.16.21 for eval of dry to wet AMD conversion OS.  Pt woke up with blurred vision OS 6 days ago that has been constant ever since.  Also sees a "horseshoe" shape OS when her eye is closed.  No change in [place] OD.  No change in [place].  Denies pain, FOL, floaters.  AT and [place] prn OU.  On Areds 2 vitamins.       Last edited by Bernarda Caffey, MD on 09/27/2020 12:50 PM. (History)    pt is here on the referral of Shirleen Schirmer, PA-C, pt states she has known for some time that she has dry macular degeneration, she states she woke up Friday morning with a new "haze" in her left eye, she thought it would clear up, but when it didn't, she called [facility] and got an appt for yesterday, pt denies family hx of macular degeneration, she used to smoke socially in college  Referring physician: Shirleen Schirmer, PA-C [facility] [address] [place],  [place] [address]  HISTORICAL INFORMATION:   Selected notes from the [place] Referred by Shirleen Schirmer, PA on 11.16.21 for eval of dry to wet AMD conversion OS.   CURRENT MEDICATIONS: No current outpatient medications on file. (Ophthalmic Drugs)   No current facility-administered medications for this visit. (Ophthalmic Drugs)   Current  Outpatient Medications (Other)  Medication Sig  . acetaminophen (TYLENOL) 500 MG tablet Take 500 mg by mouth every 6 (six) hours as needed for pain.  Marland Kitchen amLODipine (NORVASC) 5 MG tablet Take 5 mg by mouth daily.  Marland Kitchen atorvastatin (LIPITOR) 40 MG tablet Take 40 mg by mouth daily.  . Calcium Carb-Cholecalciferol (CALCIUM + D3) 600-200 MG-UNIT TABS Take 1 tablet by mouth 3 (three) times a week.   . digoxin (DIGOX) 0.125 MG tablet Take 1 tablet (0.125 mg total) by mouth daily.  Marland Kitchen ELIQUIS 5 MG TABS tablet Take 5 mg by mouth 2 (two) times daily.  . fluticasone (FLONASE) 50 MCG/ACT nasal spray Place 2 sprays into the nose daily.  . folic acid (FOLVITE) 1 MG tablet Take 1 mg by mouth daily.  . hydrochlorothiazide (HYDRODIURIL) 12.5 MG tablet Take 12.5 mg by mouth daily.  Marland Kitchen ibandronate (BONIVA) 150 MG tablet Take 150 mg by mouth every 30 (thirty) days.  . metoprolol (LOPRESSOR) 50 MG tablet Take 50 mg by mouth 2 (two) times daily.  . Multiple Vitamin (MULTIVITAMIN) tablet Take 1 tablet by mouth daily.  . Multiple Vitamins-Minerals (EYE VITAMINS & MINERALS) TABS Take by mouth.  . Potassium Chloride ER 20 MEQ TBCR Take by mouth.  . potassium chloride SA (K-DUR,KLOR-CON) 20 MEQ tablet Take 20 mEq by mouth 3 (three) times  a week. Mon, Wed, Fri   No current facility-administered medications for this visit. (Other)      REVIEW OF SYSTEMS: ROS    Positive for: Eyes   Negative for: Constitutional, Gastrointestinal, Neurological, Skin, Genitourinary, Musculoskeletal, HENT, Endocrine, Cardiovascular, Respiratory, Psychiatric, Allergic/Imm, Heme/Lymph   Last edited by Matthew Folks, COA on 09/27/2020  9:36 AM. (History)       ALLERGIES Allergies  Allergen Reactions  . Codeine Nausea And Vomiting  . Demerol [Meperidine] Nausea And Vomiting  . Morphine And Related Nausea And Vomiting  . Ciprofloxacin Rash  . Sulfa Antibiotics Rash    PAST MEDICAL HISTORY Past Medical History:  Diagnosis Date   . A-fib ([place])   . Adenopathy   . CLL (chronic lymphocytic leukemia) ([place]) 08/31/2013  . HTN (hypertension)   . Hyperlipidemia   . Lymphocytosis   . Macular degeneration    OU   Past Surgical History:  Procedure Laterality Date  . ABDOMINAL HYSTERECTOMY     endometriosis, fibroid  . APPENDECTOMY    . BREAST BIOPSY     benign cyst.  . CATARACT EXTRACTION Bilateral 2019  . CHOLECYSTECTOMY    . COLONOSCOPY  11/2012   Per Dr. Cristina Gong.  neg.   . IR FLUORO GUIDE PORT INSERTION RIGHT  08/12/2017  . IR REMOVAL TUN ACCESS W/ PORT W/O FL MOD SED  01/29/2018  . IR US GUIDE VASC ACCESS RIGHT  08/12/2017  . THUMB ARTHROSCOPY    . TOTAL HIP ARTHROPLASTY  2010    FAMILY HISTORY Family History  Problem Relation Age of Onset  . Renal Disease Mother   . Cancer Mother        colon cancer  . Cancer Father        colon cancer    SOCIAL HISTORY Social History   Tobacco Use  . Smoking status: Never Smoker  . Smokeless tobacco: Never Used  Substance Use Topics  . Alcohol use: No  . Drug use: No         OPHTHALMIC EXAM:  Base Eye Exam    Visual Acuity (Snellen - Linear)      Right Left   Dist cc 20/20 -2 20/25 -2   Dist ph cc  NI   Correction: Glasses       Tonometry (Tonopen, 9:42 AM)      Right Left   Pressure 14 13       Pupils      Dark Light Shape React APD   Right 3 2 Round Brisk None   Left 3 2 Round Brisk None       Visual Fields (Counting fingers)      Left Right    Full Full       Extraocular Movement      Right Left    Full, Ortho Full, Ortho       Neuro/Psych    Oriented x3: Yes   Mood/Affect: Normal       Dilation    Both eyes: 1.0% Mydriacyl, 2.5% Phenylephrine @ 9:42 AM        Slit Lamp and Fundus Exam    Slit Lamp Exam      Right Left   Lids/Lashes Dermatochalasis - upper lid, mild Meibomian gland dysfunction Dermatochalasis - upper lid, mild Meibomian gland dysfunction   Conjunctiva/Sclera White and quiet White and quiet   Cornea  Trace Debris in tear film, well healed temporal cataract wounds, 1+ pigmented [place] Trace Debris in tear film, well  healed temporal cataract wounds, 1+ pigmented Guttata   Anterior Chamber Deep and quiet Deep and quiet   Iris Round and dilated Round and dilated   Lens PC IOL in good position PC IOL in good position   Vitreous Trace Vitreous syneresis, Posterior vitreous detachment Trace Vitreous syneresis       Fundus Exam      Right Left   Disc Compact, Pallor, Sharp rim, mild PPA Compact, Pink and Sharp, temporal PPP   C/D Ratio 0.2 0.2   Macula Flat, Blunted foveal reflex, Drusen, RPE mottling, clumping and atrophy Blunted foveal reflex, +CNV with +SRH, Drusen, RPE mottling and clumping   Vessels attenuated, mild tortuousity attenuated, mild tortuousity   Periphery Attached; mild reticular degen Attached, reticular degeneration, No heme         Refraction    Wearing Rx      Sphere Cylinder Axis Add   Right -0.25 +0.75 165 +2.50   Left -0.50 +0.50 012 +2.50   Age: 74yrs   Type: PAL       Manifest Refraction      Sphere Cylinder Axis Dist VA   Right [place] +0.50 170 20/20-   Left -0.50 +0.75 010 20/25          IMAGING AND PROCEDURES  Imaging and Procedures for 09/27/2020  OCT, Retina - OU - Both Eyes       Right Eye Quality was good. Central Foveal Thickness: 324. Progression has no prior data. Findings include normal foveal contour, no IRF, no SRF, retinal drusen , pigment epithelial detachment.   Left Eye Quality was good. Central Foveal Thickness: 355. Progression has no prior data. Findings include abnormal foveal contour, subretinal hyper-reflective material, subretinal fluid, no IRF, pigment epithelial detachment, choroidal neovascular membrane (CNV with overlying retinal hemorrhage).   Notes *Images captured and stored on drive  Diagnosis / Impression:  OD: non-exu ARMD -- prominent central PEDs OS: exu ARMD -- CNV with subretinal hemorrhage  Clinical  management:  See below  Abbreviations: NFP - Normal foveal profile. CME - cystoid macular edema. PED - pigment epithelial detachment. IRF - intraretinal fluid. SRF - subretinal fluid. EZ - ellipsoid zone. ERM - epiretinal membrane. ORA - outer retinal atrophy. ORT - outer retinal tubulation. SRHM - subretinal hyper-reflective material. IRHM - intraretinal hyper-reflective material        Intravitreal Injection, Pharmacologic Agent - OS - Left Eye       Time Out 09/27/2020. 10:22 AM. Confirmed correct patient, procedure, site, and patient consented.   Anesthesia Topical anesthesia was used. Anesthetic medications included Lidocaine 2%, Proparacaine 0.5%.   Procedure Preparation included 5% betadine to ocular surface, eyelid speculum. A supplied needle was used.   Injection:  1.25 mg Bevacizumab (AVASTIN) SOLN   NDC: 32951-884-16, Lot: [id number] , Expiration date: 11/07/2020   Route: Intravitreal, Site: Left Eye, Waste: 0 mL  Post-op Post injection exam found visual acuity of at least counting fingers. The patient tolerated the procedure well. There were no complications. The patient received written and verbal post procedure care education. Post injection medications were not given.                 ASSESSMENT/PLAN:    ICD-10-CM   1. Exudative age-related macular degeneration of left eye with active choroidal neovascularization (HCC)  H35.3221 Intravitreal Injection, Pharmacologic Agent - OS - Left Eye    Bevacizumab (AVASTIN) SOLN 1.25 mg  2. Retinal edema  H35.81 OCT, Retina - OU - Both Eyes  3. Intermediate stage nonexudative age-related macular degeneration of both eyes  H35.3132   4. Essential hypertension  I10   5. Hypertensive retinopathy of both eyes  H35.033   6. Pseudophakia, both eyes  Z96.1     1,2. Exudative age related macular degeneration, OS  - [facility] pt with known history of nonexudative ARMD OU  - acute "haze over vision" OS -- onset, Friday  11.12.21  - BCVA remains 20/25 OS  - exam and OCT shows central CNV w/ subretinal hemorrhage  - The incidence pathology and anatomy of wet AMD discussed   - discussed treatment options including observation vs intravitreal anti-VEGF agents such as Avastin, Lucentis, Eylea.    - Risks of endophthalmitis and vascular occlusive events and atrophic changes discussed with patient  - recommend IVA OS #1 today, 11.17.21 - pt wishes to be treated with IVA  - RBA of procedure discussed, questions answered - informed consent obtained and signed - see procedure note  - f/u in 4 wks -- DFE/OCT, possible injection  3. Age related macular degeneration, non-exudative OD  - The incidence, anatomy, and pathology of dry AMD, risk of progression, and the AREDS and AREDS 2 study including smoking risks discussed with patient.  - Recommend amsler grid monitoring  4,5. Hypertensive retinopathy OU - discussed importance of tight BP control - monitor  6. Pseudophakia OU  - s/p CE/IOL OU  - IOLs in good position, doing well  - monitor      Ophthalmic Meds Ordered this visit:  Meds ordered this encounter  Medications  . Bevacizumab (AVASTIN) SOLN 1.25 mg       Return in about 4 weeks (around 10/25/2020) for f/u exu ARMD OS, DFE, OCT.  There are no Patient Instructions on file for this visit.   Explained the diagnoses, plan, and follow up with the patient and they expressed understanding.  Patient expressed understanding of the importance of proper follow up care.  This document serves as a record of services personally performed by Gardiner Sleeper, MD, PhD. It was created on their behalf by Estill Bakes, COT an ophthalmic technician. The creation of this record is the provider's dictation and/or activities during the visit.    Electronically signed by: Estill Bakes, [place] 11.17.21 @ 12:55 PM  Gardiner Sleeper, M.D., Ph.D. Diseases & Surgery of the Retina and Vitreous [facility]  I have reviewed the above documentation for accuracy and completeness, and I agree with the above. Gardiner Sleeper, M.D., Ph.D. 09/27/20 12:55 PM   Abbreviations: M myopia (nearsighted); A astigmatism; H hyperopia (farsighted); P presbyopia; Mrx spectacle prescription;  CTL contact lenses; OD right eye; OS left eye; OU both eyes  XT exotropia; ET esotropia; PEK punctate epithelial keratitis; PEE punctate epithelial erosions; DES dry eye syndrome; MGD meibomian gland dysfunction; ATs artificial tears; PFAT's preservative free artificial tears; [place] nuclear sclerotic cataract; PSC posterior subcapsular cataract; ERM epi-retinal membrane; PVD posterior vitreous detachment; RD retinal detachment; DM diabetes mellitus; DR diabetic retinopathy; NPDR non-proliferative diabetic retinopathy; PDR proliferative diabetic retinopathy; CSME clinically significant macular edema; DME diabetic macular edema; dbh dot blot hemorrhages; CWS cotton wool spot; POAG primary open angle glaucoma; C/D cup-to-disc ratio; HVF humphrey visual field; GVF goldmann visual field; OCT optical coherence tomography; IOP intraocular pressure; BRVO Branch retinal vein occlusion; CRVO central retinal vein occlusion; CRAO central retinal artery occlusion; BRAO branch retinal artery occlusion; RT retinal tear; SB scleral buckle; PPV pars plana vitrectomy; VH  Vitreous hemorrhage; PRP panretinal laser photocoagulation; IVK intravitreal kenalog; VMT vitreomacular traction; MH Macular hole;  NVD neovascularization of the disc; NVE neovascularization elsewhere; AREDS age related eye disease study; ARMD age related macular degeneration; POAG primary open angle glaucoma; EBMD epithelial/anterior basement membrane dystrophy; ACIOL anterior chamber intraocular lens; IOL intraocular lens; PCIOL posterior chamber intraocular lens; Phaco/IOL phacoemulsification with intraocular lens placement; [place] photorefractive keratectomy; LASIK laser assisted in  situ keratomileusis; HTN hypertension; DM diabetes mellitus; COPD chronic obstructive pulmonary disease

## 2020-10-02 ENCOUNTER — Other Ambulatory Visit: Payer: Self-pay | Admitting: Family Medicine

## 2020-10-02 DIAGNOSIS — Z1231 Encounter for screening mammogram for malignant neoplasm of breast: Secondary | ICD-10-CM

## 2020-10-10 DIAGNOSIS — I480 Paroxysmal atrial fibrillation: Secondary | ICD-10-CM | POA: Diagnosis not present

## 2020-10-10 DIAGNOSIS — I10 Essential (primary) hypertension: Secondary | ICD-10-CM | POA: Diagnosis not present

## 2020-10-10 DIAGNOSIS — E782 Mixed hyperlipidemia: Secondary | ICD-10-CM | POA: Diagnosis not present

## 2020-10-12 DIAGNOSIS — D485 Neoplasm of uncertain behavior of skin: Secondary | ICD-10-CM | POA: Diagnosis not present

## 2020-10-12 DIAGNOSIS — C4442 Squamous cell carcinoma of skin of scalp and neck: Secondary | ICD-10-CM | POA: Diagnosis not present

## 2020-10-12 DIAGNOSIS — L918 Other hypertrophic disorders of the skin: Secondary | ICD-10-CM | POA: Diagnosis not present

## 2020-10-12 DIAGNOSIS — D044 Carcinoma in situ of skin of scalp and neck: Secondary | ICD-10-CM | POA: Diagnosis not present

## 2020-10-24 NOTE — Progress Notes (Signed)
[facility] Note  10/25/2020     CHIEF COMPLAINT Patient presents for Retina Follow Up   HISTORY OF PRESENT ILLNESS: Amanda [name] is a 74 y.o. female who presents to the clinic today for:   HPI    Retina Follow Up    Patient presents with  Wet AMD.  In left eye.  Duration of 4 weeks.  Since onset it is stable.  I, the attending physician,  performed the HPI with the patient and updated documentation appropriately.          Comments    4 week follow up Exu ARMD OS, Dry OD-  Vision is about the same.  Vision seems better in brighter light than dim.  Systane BID OU and [place] prn.  Patient wants to know if her taking Eliquis (A-fib) has anything to do with her eye problem.       Last edited by Bernarda Caffey, MD on 10/28/2020 11:31 PM. (History)    pt states no problems after the first injection at last visit, she has not noticed a change in vision  Referring physician: Shirleen Schirmer, Joana Reamer, MD [place],  [place] [address]  HISTORICAL INFORMATION:   Selected notes from the [place] Referred by Shirleen Schirmer, PA on 11.16.21 for eval of dry to wet AMD conversion OS.   CURRENT MEDICATIONS: No current outpatient medications on file. (Ophthalmic Drugs)   No current facility-administered medications for this visit. (Ophthalmic Drugs)   Current Outpatient Medications (Other)  Medication Sig  . acetaminophen (TYLENOL) 500 MG tablet Take 500 mg by mouth every 6 (six) hours as needed for pain.  Marland Kitchen amLODipine (NORVASC) 5 MG tablet Take 5 mg by mouth daily.  Marland Kitchen atorvastatin (LIPITOR) 40 MG tablet Take 40 mg by mouth daily.  . Calcium Carb-Cholecalciferol (CALCIUM + D3) 600-200 MG-UNIT TABS Take 1 tablet by mouth 3 (three) times a week.   . digoxin (DIGOX) 0.125 MG tablet Take 1 tablet (0.125 mg total) by mouth daily.  Marland Kitchen ELIQUIS 5 MG TABS tablet Take 5 mg by mouth 2 (two) times daily.  . fluticasone  (FLONASE) 50 MCG/ACT nasal spray Place 2 sprays into the nose daily.  . folic acid (FOLVITE) 1 MG tablet Take 1 mg by mouth daily.  . hydrochlorothiazide (HYDRODIURIL) 12.5 MG tablet Take 12.5 mg by mouth daily.  Marland Kitchen ibandronate (BONIVA) 150 MG tablet Take 150 mg by mouth every 30 (thirty) days.  . metoprolol (LOPRESSOR) 50 MG tablet Take 50 mg by mouth 2 (two) times daily.  . Multiple Vitamin (MULTIVITAMIN) tablet Take 1 tablet by mouth daily.  . Multiple Vitamins-Minerals (EYE VITAMINS & MINERALS) TABS Take by mouth.  . potassium chloride SA (K-DUR,KLOR-CON) 20 MEQ tablet Take 20 mEq by mouth 3 (three) times a week. Mon, Wed, Fri  . Potassium Chloride ER 20 MEQ TBCR Take by mouth. (Patient not taking: Reported on 10/25/2020)   No current facility-administered medications for this visit. (Other)      REVIEW OF SYSTEMS: ROS    Positive for: Cardiovascular, Eyes   Negative for: Constitutional, Gastrointestinal, Neurological, Skin, Genitourinary, Musculoskeletal, HENT, Endocrine, Respiratory, Psychiatric, Allergic/Imm, Heme/Lymph   Last edited by Leonie Douglas, COA on 10/25/2020  1:41 PM. (History)       ALLERGIES Allergies  Allergen Reactions  . Codeine Nausea And Vomiting  . Demerol [Meperidine] Nausea And Vomiting  . Morphine And Related Nausea And Vomiting  . Ciprofloxacin Rash  .  Sulfa Antibiotics Rash    PAST MEDICAL HISTORY Past Medical History:  Diagnosis Date  . A-fib ([place])   . Adenopathy   . Atrial fibrillation ([place])   . CLL (chronic lymphocytic leukemia) ([place]) 08/31/2013  . HTN (hypertension)   . Hyperlipidemia   . Lymphocytosis   . Macular degeneration    OU   Past Surgical History:  Procedure Laterality Date  . ABDOMINAL HYSTERECTOMY     endometriosis, fibroid  . APPENDECTOMY    . BREAST BIOPSY     benign cyst.  . CATARACT EXTRACTION Bilateral 2019  . CHOLECYSTECTOMY    . COLONOSCOPY  11/2012   Per Dr. Cristina Gong.  neg.   . IR FLUORO GUIDE PORT INSERTION  RIGHT  08/12/2017  . IR REMOVAL TUN ACCESS W/ PORT W/O FL MOD SED  01/29/2018  . IR US GUIDE VASC ACCESS RIGHT  08/12/2017  . THUMB ARTHROSCOPY    . TOTAL HIP ARTHROPLASTY  2010    FAMILY HISTORY Family History  Problem Relation Age of Onset  . Renal Disease Mother   . Cancer Mother        colon cancer  . Cancer Father        colon cancer    SOCIAL HISTORY Social History   Tobacco Use  . Smoking status: Never Smoker  . Smokeless tobacco: Never Used  Substance Use Topics  . Alcohol use: No  . Drug use: No         OPHTHALMIC EXAM:  Base Eye Exam    Visual Acuity (Snellen - Linear)      Right Left   Dist cc 20/20 -2 20/25 +1   Dist ph cc  NI   Correction: Glasses       Tonometry (Tonopen, 1:46 PM)      Right Left   Pressure 16 13       Pupils      Dark Light Shape React APD   Right 3 2 Round Brisk None   Left 3 2 Round Brisk None       Visual Fields (Counting fingers)      Left Right    Full Full       Extraocular Movement      Right Left    Full Full       Neuro/Psych    Oriented x3: Yes   Mood/Affect: Normal       Dilation    Both eyes: 1.0% Mydriacyl, 2.5% Phenylephrine @ 1:46 PM        Slit Lamp and Fundus Exam    Slit Lamp Exam      Right Left   Lids/Lashes Dermatochalasis - upper lid, mild Meibomian gland dysfunction Dermatochalasis - upper lid, mild Meibomian gland dysfunction   Conjunctiva/Sclera White and quiet White and quiet   Cornea Trace Debris in tear film, well healed temporal cataract wounds, 1+ pigmented Guttata Trace Debris in tear film, well healed temporal cataract wounds, 1+ pigmented Guttata   Anterior Chamber Deep and quiet Deep and quiet   Iris Round and dilated Round and dilated   Lens PC IOL in good position PC IOL in good position   Vitreous Trace Vitreous syneresis, Posterior vitreous detachment Trace Vitreous syneresis       Fundus Exam      Right Left   Disc Compact, Pallor, Sharp rim, mild PPA Compact, Pink  and Sharp, temporal PPP   C/D Ratio 0.1 0.2   Macula Flat, Blunted foveal reflex, Drusen, RPE  mottling, clumping and atrophy Blunted foveal reflex, +CNV with +SRH -- mild interval improvement in [facility] density, Drusen, RPE mottling and clumping   Vessels attenuated, mild tortuousity attenuated, mild tortuousity   Periphery Attached; mild reticular degen Attached, reticular degeneration, No heme         Refraction    Wearing Rx      Sphere Cylinder Axis Add   Right -0.25 +0.75 165 +2.50   Left -0.50 +0.50 012 +2.50   Type: PAL          IMAGING AND PROCEDURES  Imaging and Procedures for 10/25/2020  OCT, Retina - OU - Both Eyes       Right Eye Quality was good. Central Foveal Thickness: 318. Progression has been stable. Findings include normal foveal contour, no IRF, no SRF, retinal drusen , pigment epithelial detachment.   Left Eye Quality was good. Central Foveal Thickness: 280. Progression has improved. Findings include abnormal foveal contour, subretinal hyper-reflective material, subretinal fluid, no IRF, pigment epithelial detachment, choroidal neovascular membrane (Persistent PED with interval improvement in SRF/SRHM).   Notes *Images captured and stored on drive  Diagnosis / Impression:  OD: non-exu ARMD -- prominent central PEDs OS: exu ARMD -- Persistent PED with interval improvement in SRF/SRHM  Clinical management:  See below  Abbreviations: NFP - Normal foveal profile. CME - cystoid macular edema. PED - pigment epithelial detachment. IRF - intraretinal fluid. SRF - subretinal fluid. EZ - ellipsoid zone. ERM - epiretinal membrane. ORA - outer retinal atrophy. ORT - outer retinal tubulation. SRHM - subretinal hyper-reflective material. IRHM - intraretinal hyper-reflective material        Intravitreal Injection, Pharmacologic Agent - OS - Left Eye       Time Out 10/25/2020. 2:49 PM. Confirmed correct patient, procedure, site, and patient consented.    Anesthesia Topical anesthesia was used. Anesthetic medications included Lidocaine 2%, Proparacaine 0.5%.   Procedure Preparation included 5% betadine to ocular surface, eyelid speculum. A (32g) needle was used.   Injection:  1.25 mg Bevacizumab (AVASTIN) 1.25mg /0.78mL SOLN   NDC: 41324-401-02, Lot: [id number], Expiration date: 12/11/2020   Route: Intravitreal, Site: Left Eye, Waste: 0.05 mL  Post-op Post injection exam found visual acuity of at least counting fingers. The patient tolerated the procedure well. There were no complications. The patient received written and verbal post procedure care education.                 ASSESSMENT/PLAN:    ICD-10-CM   1. Exudative age-related macular degeneration of left eye with active choroidal neovascularization (HCC)  H35.3221 Intravitreal Injection, Pharmacologic Agent - OS - Left Eye    Bevacizumab (AVASTIN) SOLN 1.25 mg  2. Retinal edema  H35.81 OCT, Retina - OU - Both Eyes  3. Intermediate stage nonexudative age-related macular degeneration of both eyes  H35.3132   4. Essential hypertension  I10   5. Hypertensive retinopathy of both eyes  H35.033   6. Pseudophakia, both eyes  Z96.1     1,2. Exudative age related macular degeneration, OS  - [facility] pt with known history of nonexudative ARMD OU  - acute "haze over vision" OS -- onset, Friday 11.12.21  - s/p IVA OS #1 (11.17.21)  - BCVA remains 20/25 OS  - exam and OCT shows central CNV w/ subretinal hemorrhage improving  - recommend IVA OS #2 today, 12.15.21 - pt wishes to be treated with IVA  - RBA of procedure discussed, questions answered - informed consent obtained and signed -  see procedure note  - f/u in 4 wks -- DFE/OCT, possible injection  3. Age related macular degeneration, non-exudative OD  - The incidence, anatomy, and pathology of dry AMD, risk of progression, and the AREDS and AREDS 2 study including smoking risks discussed with patient.  - Recommend  [place]ler grid monitoring  4,5. Hypertensive retinopathy OU - discussed importance of tight BP control - monitor  6. Pseudophakia OU  - s/p CE/IOL OU  - IOLs in good position, doing well  - monitor  Ophthalmic Meds Ordered this visit:  Meds ordered this encounter  Medications  . Bevacizumab (AVASTIN) SOLN 1.25 mg       Return in about 4 weeks (around 11/22/2020) for f/u exu ARMD OS, DFE, OCT.  There are no Patient Instructions on file for this visit.   Explained the diagnoses, plan, and follow up with the patient and they expressed understanding.  Patient expressed understanding of the importance of proper follow up care.  This document serves as a record of services personally performed by Gardiner Sleeper, MD, PhD. It was created on their behalf by Roselee Nova, COMT. The creation of this record is the provider's dictation and/or activities during the visit.  Electronically signed by: Roselee Nova, COMT 10/28/20 11:37 PM   This document serves as a record of services personally performed by Gardiner Sleeper, MD, PhD. It was created on their behalf by San Jetty. Owens Shark, OA an ophthalmic technician. The creation of this record is the provider's dictation and/or activities during the visit.    Electronically signed by: San Jetty. Owens Shark, [place] 12.15.2021 11:37 PM  Gardiner Sleeper, M.D., Ph.D. Diseases & Surgery of the Retina and Vitreous [facility]  I have reviewed the above documentation for accuracy and completeness, and I agree with the above. Gardiner Sleeper, M.D., Ph.D. 10/28/20 11:37 PM    Abbreviations: M myopia (nearsighted); A astigmatism; H hyperopia (farsighted); P presbyopia; Mrx spectacle prescription;  CTL contact lenses; OD right eye; OS left eye; OU both eyes  XT exotropia; ET esotropia; PEK punctate epithelial keratitis; PEE punctate epithelial erosions; DES dry eye syndrome; MGD meibomian gland dysfunction; ATs artificial tears; PFAT's  preservative free artificial tears; [place] nuclear sclerotic cataract; PSC posterior subcapsular cataract; ERM epi-retinal membrane; PVD posterior vitreous detachment; RD retinal detachment; DM diabetes mellitus; DR diabetic retinopathy; NPDR non-proliferative diabetic retinopathy; PDR proliferative diabetic retinopathy; CSME clinically significant macular edema; DME diabetic macular edema; dbh dot blot hemorrhages; CWS cotton wool spot; POAG primary open angle glaucoma; C/D cup-to-disc ratio; HVF humphrey visual field; GVF goldmann visual field; OCT optical coherence tomography; IOP intraocular pressure; BRVO Branch retinal vein occlusion; CRVO central retinal vein occlusion; CRAO central retinal artery occlusion; BRAO branch retinal artery occlusion; RT retinal tear; SB scleral buckle; PPV pars plana vitrectomy; VH Vitreous hemorrhage; PRP panretinal laser photocoagulation; IVK intravitreal kenalog; VMT vitreomacular traction; MH Macular hole;  NVD neovascularization of the disc; NVE neovascularization elsewhere; AREDS age related eye disease study; ARMD age related macular degeneration; POAG primary open angle glaucoma; EBMD epithelial/anterior basement membrane dystrophy; ACIOL anterior chamber intraocular lens; IOL intraocular lens; PCIOL posterior chamber intraocular lens; Phaco/IOL phacoemulsification with intraocular lens placement; [place] photorefractive keratectomy; LASIK laser assisted in situ keratomileusis; HTN hypertension; DM diabetes mellitus; COPD chronic obstructive pulmonary disease

## 2020-10-25 ENCOUNTER — Ambulatory Visit (INDEPENDENT_AMBULATORY_CARE_PROVIDER_SITE_OTHER): Payer: Medicare Other | Admitting: Ophthalmology

## 2020-10-25 ENCOUNTER — Other Ambulatory Visit: Payer: Self-pay

## 2020-10-25 ENCOUNTER — Encounter (INDEPENDENT_AMBULATORY_CARE_PROVIDER_SITE_OTHER): Payer: Self-pay | Admitting: Ophthalmology

## 2020-10-25 DIAGNOSIS — I10 Essential (primary) hypertension: Secondary | ICD-10-CM

## 2020-10-25 DIAGNOSIS — H35033 Hypertensive retinopathy, bilateral: Secondary | ICD-10-CM

## 2020-10-25 DIAGNOSIS — H353132 Nonexudative age-related macular degeneration, bilateral, intermediate dry stage: Secondary | ICD-10-CM

## 2020-10-25 DIAGNOSIS — H3581 Retinal edema: Secondary | ICD-10-CM | POA: Diagnosis not present

## 2020-10-25 DIAGNOSIS — Z961 Presence of intraocular lens: Secondary | ICD-10-CM

## 2020-10-25 DIAGNOSIS — H353221 Exudative age-related macular degeneration, left eye, with active choroidal neovascularization: Secondary | ICD-10-CM

## 2020-10-28 ENCOUNTER — Encounter (INDEPENDENT_AMBULATORY_CARE_PROVIDER_SITE_OTHER): Payer: Self-pay | Admitting: Ophthalmology

## 2020-10-28 DIAGNOSIS — I10 Essential (primary) hypertension: Secondary | ICD-10-CM | POA: Diagnosis not present

## 2020-10-28 DIAGNOSIS — H35033 Hypertensive retinopathy, bilateral: Secondary | ICD-10-CM | POA: Diagnosis not present

## 2020-10-28 DIAGNOSIS — Z961 Presence of intraocular lens: Secondary | ICD-10-CM | POA: Diagnosis not present

## 2020-10-28 DIAGNOSIS — H353132 Nonexudative age-related macular degeneration, bilateral, intermediate dry stage: Secondary | ICD-10-CM | POA: Diagnosis not present

## 2020-10-28 DIAGNOSIS — H3581 Retinal edema: Secondary | ICD-10-CM | POA: Diagnosis not present

## 2020-10-28 DIAGNOSIS — H353221 Exudative age-related macular degeneration, left eye, with active choroidal neovascularization: Secondary | ICD-10-CM | POA: Diagnosis not present

## 2020-10-28 MED ORDER — BEVACIZUMAB CHEMO INJECTION 1.25MG/0.05ML SYRINGE FOR KALEIDOSCOPE
1.2500 mg | INTRAVITREAL | Status: AC | PRN
  Administered 2020-10-28: 1.25 mg via INTRAVITREAL

## 2020-11-17 ENCOUNTER — Ambulatory Visit: Payer: Medicare Other

## 2020-11-22 ENCOUNTER — Ambulatory Visit (INDEPENDENT_AMBULATORY_CARE_PROVIDER_SITE_OTHER): Payer: Medicare Other | Admitting: Ophthalmology

## 2020-11-22 ENCOUNTER — Other Ambulatory Visit: Payer: Self-pay

## 2020-11-22 ENCOUNTER — Encounter (INDEPENDENT_AMBULATORY_CARE_PROVIDER_SITE_OTHER): Payer: Self-pay | Admitting: Ophthalmology

## 2020-11-22 DIAGNOSIS — H3581 Retinal edema: Secondary | ICD-10-CM | POA: Diagnosis not present

## 2020-11-22 DIAGNOSIS — H353132 Nonexudative age-related macular degeneration, bilateral, intermediate dry stage: Secondary | ICD-10-CM

## 2020-11-22 DIAGNOSIS — H353221 Exudative age-related macular degeneration, left eye, with active choroidal neovascularization: Secondary | ICD-10-CM | POA: Diagnosis not present

## 2020-11-22 DIAGNOSIS — H35033 Hypertensive retinopathy, bilateral: Secondary | ICD-10-CM | POA: Diagnosis not present

## 2020-11-22 DIAGNOSIS — I10 Essential (primary) hypertension: Secondary | ICD-10-CM | POA: Diagnosis not present

## 2020-11-22 DIAGNOSIS — Z961 Presence of intraocular lens: Secondary | ICD-10-CM | POA: Diagnosis not present

## 2020-11-22 MED ORDER — BEVACIZUMAB CHEMO INJECTION 1.25MG/0.05ML SYRINGE FOR KALEIDOSCOPE
1.2500 mg | INTRAVITREAL | Status: AC | PRN
  Administered 2020-11-22: 1.25 mg via INTRAVITREAL

## 2020-11-22 NOTE — Progress Notes (Signed)
[facility] Note  11/22/2020     CHIEF COMPLAINT Patient presents for Retina Follow Up   HISTORY OF PRESENT ILLNESS: Amanda [name] is a 75 y.o. female who presents to the clinic today for:   HPI    Retina Follow Up    Patient presents with  Wet AMD.  In right eye.  This started weeks ago.  Severity is moderate.  Duration of weeks.  Since onset it is stable.  I, the attending physician,  performed the HPI with the patient and updated documentation appropriately.          Comments    Pt states vision is better since last visit OU.  Pt denies eye pain or discomfort and denies any new or worsening floaters or fol OU.       Last edited by Bernarda Caffey, MD on 11/22/2020  3:11 PM. (History)    pt states vision is improving  Referring physician: Shirleen Schirmer, PA-C [facility] [address] [place],  [place] [address]  HISTORICAL INFORMATION:   Selected notes from the MEDICAL RECORD NUMBER Referred by Shirleen Schirmer, PA on 11.16.21 for eval of dry to wet AMD conversion OS.   CURRENT MEDICATIONS: No current outpatient medications on file. (Ophthalmic Drugs)   No current facility-administered medications for this visit. (Ophthalmic Drugs)   Current Outpatient Medications (Other)  Medication Sig  . acetaminophen (TYLENOL) 500 MG tablet Take 500 mg by mouth every 6 (six) hours as needed for pain.  Marland Kitchen amLODipine (NORVASC) 5 MG tablet Take 5 mg by mouth daily.  Marland Kitchen atorvastatin (LIPITOR) 40 MG tablet Take 40 mg by mouth daily.  . Calcium Carb-Cholecalciferol (CALCIUM + D3) 600-200 MG-UNIT TABS Take 1 tablet by mouth 3 (three) times a week.   . digoxin (DIGOX) 0.125 MG tablet Take 1 tablet (0.125 mg total) by mouth daily.  Marland Kitchen ELIQUIS 5 MG TABS tablet Take 5 mg by mouth 2 (two) times daily.  . fluticasone (FLONASE) 50 MCG/ACT nasal spray Place 2 sprays into the nose daily.  . folic acid (FOLVITE) 1 MG tablet Take 1 mg by mouth  daily.  . hydrochlorothiazide (HYDRODIURIL) 12.5 MG tablet Take 12.5 mg by mouth daily.  Marland Kitchen ibandronate (BONIVA) 150 MG tablet Take 150 mg by mouth every 30 (thirty) days.  . metoprolol (LOPRESSOR) 50 MG tablet Take 50 mg by mouth 2 (two) times daily.  . Multiple Vitamin (MULTIVITAMIN) tablet Take 1 tablet by mouth daily.  . Multiple Vitamins-Minerals (EYE VITAMINS & MINERALS) TABS Take by mouth.  . Potassium Chloride ER 20 MEQ TBCR Take by mouth. (Patient not taking: Reported on 10/25/2020)  . potassium chloride SA (K-DUR,KLOR-CON) 20 MEQ tablet Take 20 mEq by mouth 3 (three) times a week. Mon, Wed, Fri   No current facility-administered medications for this visit. (Other)      REVIEW OF SYSTEMS: ROS    Positive for: Cardiovascular, Eyes   Negative for: Constitutional, Gastrointestinal, Neurological, Skin, Genitourinary, Musculoskeletal, HENT, Endocrine, Respiratory, Psychiatric, Allergic/Imm, Heme/Lymph   Last edited by Doneen Poisson on 11/22/2020  1:58 PM. (History)       ALLERGIES Allergies  Allergen Reactions  . Codeine Nausea And Vomiting  . Demerol [Meperidine] Nausea And Vomiting  . Morphine And Related Nausea And Vomiting  . Ciprofloxacin Rash  . Sulfa Antibiotics Rash    PAST MEDICAL HISTORY Past Medical History:  Diagnosis Date  . A-fib ([place])   . Adenopathy   .  Atrial fibrillation ([place])   . CLL (chronic lymphocytic leukemia) ([place]) 08/31/2013  . HTN (hypertension)   . Hyperlipidemia   . Lymphocytosis   . Macular degeneration    OU   Past Surgical History:  Procedure Laterality Date  . ABDOMINAL HYSTERECTOMY     endometriosis, fibroid  . APPENDECTOMY    . BREAST BIOPSY     benign cyst.  . CATARACT EXTRACTION Bilateral 2019  . CHOLECYSTECTOMY    . COLONOSCOPY  11/2012   Per Dr. Cristina Gong.  neg.   . IR FLUORO GUIDE PORT INSERTION RIGHT  08/12/2017  . IR REMOVAL TUN ACCESS W/ PORT W/O FL MOD SED  01/29/2018  . IR US GUIDE VASC ACCESS RIGHT  08/12/2017  .  THUMB ARTHROSCOPY    . TOTAL HIP ARTHROPLASTY  2010    FAMILY HISTORY Family History  Problem Relation Age of Onset  . Renal Disease Mother   . Cancer Mother        colon cancer  . Cancer Father        colon cancer    SOCIAL HISTORY Social History   Tobacco Use  . Smoking status: Never Smoker  . Smokeless tobacco: Never Used  Substance Use Topics  . Alcohol use: No  . Drug use: No         OPHTHALMIC EXAM:  Base Eye Exam    Visual Acuity (Snellen - Linear)      Right Left   Dist cc 20/20 -2 20/20 -2   Correction: Glasses       Tonometry (Tonopen, 2:01 PM)      Right Left   Pressure 16 14       Pupils      Dark Light Shape React APD   Right 3 2 Round Brisk 0   Left 3 2 Round Brisk 0       Visual Fields      Left Right    Full Full       Extraocular Movement      Right Left    Full Full       Neuro/Psych    Oriented x3: Yes   Mood/Affect: Normal       Dilation    Both eyes: 1.0% Mydriacyl, 2.5% Phenylephrine @ 2:01 PM        Slit Lamp and Fundus Exam    Slit Lamp Exam      Right Left   Lids/Lashes Dermatochalasis - upper lid, mild Meibomian gland dysfunction Dermatochalasis - upper lid, mild Meibomian gland dysfunction   Conjunctiva/Sclera White and quiet White and quiet   Cornea Trace Debris in tear film, well healed temporal cataract wounds, 1+ pigmented Guttata Trace Debris in tear film, well healed temporal cataract wounds, 1+ pigmented Guttata   Anterior Chamber Deep and quiet Deep and quiet   Iris Round and dilated Round and dilated   Lens PC IOL in good position PC IOL in good position   Vitreous Trace Vitreous syneresis, Posterior vitreous detachment Trace Vitreous syneresis, Posterior vitreous detachment       Fundus Exam      Right Left   Disc Compact, Pallor, Sharp rim, mild PPA Compact, Pink and Sharp, temporal PPP   C/D Ratio 0.1 0.2   Macula Flat, Blunted foveal reflex, Drusen, RPE mottling, clumping and atrophy Blunted  foveal reflex, +CNV with +SRH -- mild interval improvement in [facility] density, Drusen, RPE mottling and clumping   Vessels attenuated, mild tortuousity attenuated, mild tortuousity  Periphery Attached; mild reticular degen Attached, reticular degeneration, No heme         Refraction    Wearing Rx      Sphere Cylinder Axis Add   Right -0.25 +0.75 165 +2.50   Left -0.50 +0.50 012 +2.50   Type: PAL          IMAGING AND PROCEDURES  Imaging and Procedures for 11/22/2020  OCT, Retina - OU - Both Eyes       Right Eye Quality was good. Central Foveal Thickness: 322. Progression has been stable. Findings include normal foveal contour, no IRF, no SRF, retinal drusen , pigment epithelial detachment, outer retinal atrophy.   Left Eye Quality was good. Central Foveal Thickness: 264. Progression has improved. Findings include abnormal foveal contour, subretinal hyper-reflective material, subretinal fluid, no IRF, pigment epithelial detachment, choroidal neovascular membrane, outer retinal atrophy, retinal drusen  (interval improvement in SRF/SRHM; mild interval increase in PED size).   Notes *Images captured and stored on drive  Diagnosis / Impression:  OD: non-exu ARMD -- prominent central PEDs OS: exu ARMD -- interval improvement in SRF/SRHM; mild interval increase in PED size  Clinical management:  See below  Abbreviations: NFP - Normal foveal profile. CME - cystoid macular edema. PED - pigment epithelial detachment. IRF - intraretinal fluid. SRF - subretinal fluid. EZ - ellipsoid zone. ERM - epiretinal membrane. ORA - outer retinal atrophy. ORT - outer retinal tubulation. SRHM - subretinal hyper-reflective material. IRHM - intraretinal hyper-reflective material        Intravitreal Injection, Pharmacologic Agent - OS - Left Eye       Time Out 11/22/2020. 2:42 PM. Confirmed correct patient, procedure, site, and patient consented.   Anesthesia Topical anesthesia was used.  Anesthetic medications included Lidocaine 2%, Proparacaine 0.5%.   Procedure Preparation included 5% betadine to ocular surface, eyelid speculum. A (32g) needle was used.   Injection:  1.25 mg Bevacizumab (AVASTIN) 1.25mg /0.54mL SOLN   NDC: H061816, Lot: [id number] , Expiration date: 12/20/2020   Route: Intravitreal, Site: Left Eye, Waste: 0 mL  Post-op Post injection exam found visual acuity of at least counting fingers. The patient tolerated the procedure well. There were no complications. The patient received written and verbal post procedure care education.                 ASSESSMENT/PLAN:    ICD-10-CM   1. Exudative age-related macular degeneration of left eye with active choroidal neovascularization (HCC)  H35.3221 Intravitreal Injection, Pharmacologic Agent - OS - Left Eye    Bevacizumab (AVASTIN) SOLN 1.25 mg  2. Retinal edema  H35.81 OCT, Retina - OU - Both Eyes  3. Intermediate stage nonexudative age-related macular degeneration of both eyes  H35.3132   4. Essential hypertension  I10   5. Hypertensive retinopathy of both eyes  H35.033   6. Pseudophakia, both eyes  Z96.1     1,2. Exudative age related macular degeneration, OS  - [facility] pt with known history of nonexudative ARMD OU  - acute "haze over vision" OS -- onset, Friday 11.12.21 -- initial exam with central subretinal heme  - s/p IVA OS #1 (11.17.21), #2 (12.15.21)  - BCVA improved to 20/20 from 20/25 OS  - exam and OCT shows central CNV w/ subretinal hemorrhage improving  - recommend IVA OS #3 today, 01.12.22 - pt wishes to be treated with IVA  - RBA of procedure discussed, questions answered - informed consent obtained and signed - see procedure note  - f/u  in 4 wks -- DFE/OCT, possible injection  3. Age related macular degeneration, non-exudative OD  - The incidence, anatomy, and pathology of dry AMD, risk of progression, and the AREDS and AREDS 2 study including smoking risks discussed  with patient.  - recommend [place] grid monitoring  4,5. Hypertensive retinopathy OU - discussed importance of tight BP control - monitor  6. Pseudophakia OU  - s/p CE/IOL OU  - IOLs in good position, doing well  - monitor  Ophthalmic Meds Ordered this visit:  Meds ordered this encounter  Medications  . Bevacizumab (AVASTIN) SOLN 1.25 mg       Return in about 4 weeks (around 12/20/2020) for f/u exu ARMD OS, DFE, OCT.  There are no Patient Instructions on file for this visit.   Explained the diagnoses, plan, and follow up with the patient and they expressed understanding.  Patient expressed understanding of the importance of proper follow up care.  This document serves as a record of services personally performed by Gardiner Sleeper, MD, PhD. It was created on their behalf by Roselee Nova, COMT. The creation of this record is the provider's dictation and/or activities during the visit.  Electronically signed by: Roselee Nova, COMT 11/22/20 11:50 PM   This document serves as a record of services personally performed by Gardiner Sleeper, MD, PhD. It was created on their behalf by San Jetty. Owens Shark, OA an ophthalmic technician. The creation of this record is the provider's dictation and/or activities during the visit.    Electronically signed by: San Jetty. Marguerita Merles 01.12.2022 11:50 PM  Gardiner Sleeper, M.D., Ph.D. Diseases & Surgery of the Retina and Vitreous [facility]  I have reviewed the above documentation for accuracy and completeness, and I agree with the above. Gardiner Sleeper, M.D., Ph.D. 11/22/20 11:50 PM    Abbreviations: M myopia (nearsighted); A astigmatism; H hyperopia (farsighted); P presbyopia; Mrx spectacle prescription;  CTL contact lenses; OD right eye; OS left eye; OU both eyes  XT exotropia; ET esotropia; PEK punctate epithelial keratitis; PEE punctate epithelial erosions; DES dry eye syndrome; MGD meibomian gland dysfunction; ATs  artificial tears; PFAT's preservative free artificial tears; [place] nuclear sclerotic cataract; PSC posterior subcapsular cataract; ERM epi-retinal membrane; PVD posterior vitreous detachment; RD retinal detachment; DM diabetes mellitus; DR diabetic retinopathy; NPDR non-proliferative diabetic retinopathy; PDR proliferative diabetic retinopathy; CSME clinically significant macular edema; DME diabetic macular edema; dbh dot blot hemorrhages; CWS cotton wool spot; POAG primary open angle glaucoma; C/D cup-to-disc ratio; HVF humphrey visual field; GVF goldmann visual field; OCT optical coherence tomography; IOP intraocular pressure; BRVO Branch retinal vein occlusion; CRVO central retinal vein occlusion; CRAO central retinal artery occlusion; BRAO branch retinal artery occlusion; RT retinal tear; SB scleral buckle; PPV pars plana vitrectomy; VH Vitreous hemorrhage; PRP panretinal laser photocoagulation; IVK intravitreal kenalog; VMT vitreomacular traction; MH Macular hole;  NVD neovascularization of the disc; NVE neovascularization elsewhere; AREDS age related eye disease study; ARMD age related macular degeneration; POAG primary open angle glaucoma; EBMD epithelial/anterior basement membrane dystrophy; ACIOL anterior chamber intraocular lens; IOL intraocular lens; PCIOL posterior chamber intraocular lens; Phaco/IOL phacoemulsification with intraocular lens placement; [place] photorefractive keratectomy; LASIK laser assisted in situ keratomileusis; HTN hypertension; DM diabetes mellitus; COPD chronic obstructive pulmonary disease

## 2020-12-08 DIAGNOSIS — I480 Paroxysmal atrial fibrillation: Secondary | ICD-10-CM | POA: Diagnosis not present

## 2020-12-08 DIAGNOSIS — M85852 Other specified disorders of bone density and structure, left thigh: Secondary | ICD-10-CM | POA: Diagnosis not present

## 2020-12-08 DIAGNOSIS — J309 Allergic rhinitis, unspecified: Secondary | ICD-10-CM | POA: Diagnosis not present

## 2020-12-08 DIAGNOSIS — I10 Essential (primary) hypertension: Secondary | ICD-10-CM | POA: Diagnosis not present

## 2020-12-08 DIAGNOSIS — D6959 Other secondary thrombocytopenia: Secondary | ICD-10-CM | POA: Diagnosis not present

## 2020-12-08 DIAGNOSIS — C9191 Lymphoid leukemia, unspecified, in remission: Secondary | ICD-10-CM | POA: Diagnosis not present

## 2020-12-08 DIAGNOSIS — Z79899 Other long term (current) drug therapy: Secondary | ICD-10-CM | POA: Diagnosis not present

## 2020-12-08 DIAGNOSIS — C9110 Chronic lymphocytic leukemia of B-cell type not having achieved remission: Secondary | ICD-10-CM | POA: Diagnosis not present

## 2020-12-08 DIAGNOSIS — E782 Mixed hyperlipidemia: Secondary | ICD-10-CM | POA: Diagnosis not present

## 2020-12-08 DIAGNOSIS — Z7189 Other specified counseling: Secondary | ICD-10-CM | POA: Diagnosis not present

## 2020-12-08 DIAGNOSIS — Z0000 Encounter for general adult medical examination without abnormal findings: Secondary | ICD-10-CM | POA: Diagnosis not present

## 2020-12-12 DIAGNOSIS — Z01419 Encounter for gynecological examination (general) (routine) without abnormal findings: Secondary | ICD-10-CM | POA: Diagnosis not present

## 2020-12-12 DIAGNOSIS — I10 Essential (primary) hypertension: Secondary | ICD-10-CM | POA: Diagnosis not present

## 2020-12-12 DIAGNOSIS — M85852 Other specified disorders of bone density and structure, left thigh: Secondary | ICD-10-CM | POA: Diagnosis not present

## 2020-12-12 DIAGNOSIS — I480 Paroxysmal atrial fibrillation: Secondary | ICD-10-CM | POA: Diagnosis not present

## 2020-12-12 DIAGNOSIS — Z1389 Encounter for screening for other disorder: Secondary | ICD-10-CM | POA: Diagnosis not present

## 2020-12-12 DIAGNOSIS — Z0000 Encounter for general adult medical examination without abnormal findings: Secondary | ICD-10-CM | POA: Diagnosis not present

## 2020-12-12 DIAGNOSIS — C9191 Lymphoid leukemia, unspecified, in remission: Secondary | ICD-10-CM | POA: Diagnosis not present

## 2020-12-12 DIAGNOSIS — H3562 Retinal hemorrhage, left eye: Secondary | ICD-10-CM | POA: Diagnosis not present

## 2020-12-12 DIAGNOSIS — Z79899 Other long term (current) drug therapy: Secondary | ICD-10-CM | POA: Diagnosis not present

## 2020-12-12 DIAGNOSIS — J309 Allergic rhinitis, unspecified: Secondary | ICD-10-CM | POA: Diagnosis not present

## 2020-12-12 DIAGNOSIS — E782 Mixed hyperlipidemia: Secondary | ICD-10-CM | POA: Diagnosis not present

## 2020-12-12 DIAGNOSIS — H3530 Unspecified macular degeneration: Secondary | ICD-10-CM | POA: Diagnosis not present

## 2020-12-18 ENCOUNTER — Other Ambulatory Visit: Payer: Self-pay

## 2020-12-18 ENCOUNTER — Ambulatory Visit
Admission: RE | Admit: 2020-12-18 | Discharge: 2020-12-18 | Disposition: A | Discharge status: Home / Self Care | Payer: Medicare Other | Source: Ambulatory Visit | Attending: Family Medicine | Admitting: Family Medicine

## 2020-12-18 DIAGNOSIS — Z1231 Encounter for screening mammogram for malignant neoplasm of breast: Secondary | ICD-10-CM | POA: Diagnosis not present

## 2020-12-19 DIAGNOSIS — I10 Essential (primary) hypertension: Secondary | ICD-10-CM | POA: Diagnosis not present

## 2020-12-19 DIAGNOSIS — I480 Paroxysmal atrial fibrillation: Secondary | ICD-10-CM | POA: Diagnosis not present

## 2020-12-19 DIAGNOSIS — E782 Mixed hyperlipidemia: Secondary | ICD-10-CM | POA: Diagnosis not present

## 2020-12-20 ENCOUNTER — Other Ambulatory Visit: Payer: Self-pay

## 2020-12-20 ENCOUNTER — Encounter (INDEPENDENT_AMBULATORY_CARE_PROVIDER_SITE_OTHER): Payer: Self-pay | Admitting: Ophthalmology

## 2020-12-20 ENCOUNTER — Ambulatory Visit (INDEPENDENT_AMBULATORY_CARE_PROVIDER_SITE_OTHER): Payer: Medicare Other | Admitting: Ophthalmology

## 2020-12-20 DIAGNOSIS — H3581 Retinal edema: Secondary | ICD-10-CM

## 2020-12-20 DIAGNOSIS — H353132 Nonexudative age-related macular degeneration, bilateral, intermediate dry stage: Secondary | ICD-10-CM | POA: Diagnosis not present

## 2020-12-20 DIAGNOSIS — Z961 Presence of intraocular lens: Secondary | ICD-10-CM

## 2020-12-20 DIAGNOSIS — H353221 Exudative age-related macular degeneration, left eye, with active choroidal neovascularization: Secondary | ICD-10-CM | POA: Diagnosis not present

## 2020-12-20 DIAGNOSIS — I10 Essential (primary) hypertension: Secondary | ICD-10-CM | POA: Diagnosis not present

## 2020-12-20 DIAGNOSIS — H35033 Hypertensive retinopathy, bilateral: Secondary | ICD-10-CM | POA: Diagnosis not present

## 2020-12-20 MED ORDER — BEVACIZUMAB CHEMO INJECTION 1.25MG/0.05ML SYRINGE FOR KALEIDOSCOPE
1.2500 mg | INTRAVITREAL | Status: AC | PRN
  Administered 2020-12-20: 1.25 mg via INTRAVITREAL

## 2020-12-20 NOTE — Progress Notes (Signed)
[facility] Note  12/20/2020     CHIEF COMPLAINT Patient presents for Retina Follow Up   HISTORY OF PRESENT ILLNESS: Amanda [name] is a 75 y.o. female who presents to the clinic today for:   HPI    Retina Follow Up    Patient presents with  Wet AMD.  In left eye.  Duration of 4 weeks.  Since onset it is gradually improving.  I, the attending physician,  performed the HPI with the patient and updated documentation appropriately.          Comments    4 week follow up Exu ARMD OS- OS still has a gray haze but has improved.  Sees it more in dim light.  Patient states much better. Uses [place] for itchiness prn and ATs prn.       Last edited by Bernarda Caffey, MD on 12/20/2020  3:02 PM. (History)    pt states her vision has not changed  Referring physician: Shirleen Schirmer, Joana Reamer, MD [place],  [place] [address]  HISTORICAL INFORMATION:   Selected notes from the MEDICAL RECORD NUMBER Referred by Shirleen Schirmer, PA on 11.16.21 for eval of dry to wet AMD conversion OS.   CURRENT MEDICATIONS: No current outpatient medications on file. (Ophthalmic Drugs)   No current facility-administered medications for this visit. (Ophthalmic Drugs)   Current Outpatient Medications (Other)  Medication Sig  . acetaminophen (TYLENOL) 500 MG tablet Take 500 mg by mouth every 6 (six) hours as needed for pain.  Marland Kitchen amLODipine (NORVASC) 5 MG tablet Take 5 mg by mouth daily.  Marland Kitchen atorvastatin (LIPITOR) 40 MG tablet Take 40 mg by mouth daily.  . Calcium Carb-Cholecalciferol (CALCIUM + D3) 600-200 MG-UNIT TABS Take 1 tablet by mouth 3 (three) times a week.   Marland Kitchen ELIQUIS 5 MG TABS tablet Take 5 mg by mouth 2 (two) times daily.  . fluticasone (FLONASE) 50 MCG/ACT nasal spray Place 2 sprays into the nose daily.  . folic acid (FOLVITE) 1 MG tablet Take 1 mg by mouth daily.  . hydrochlorothiazide (HYDRODIURIL) 12.5 MG tablet Take 12.5 mg by mouth daily.   Marland Kitchen ibandronate (BONIVA) 150 MG tablet Take 150 mg by mouth every 30 (thirty) days.  . metoprolol (LOPRESSOR) 50 MG tablet Take 50 mg by mouth 2 (two) times daily.  . Multiple Vitamin (MULTIVITAMIN) tablet Take 1 tablet by mouth daily.  . Multiple Vitamins-Minerals (EYE VITAMINS & MINERALS) TABS Take by mouth.  . potassium chloride SA (K-DUR,KLOR-CON) 20 MEQ tablet Take 20 mEq by mouth 3 (three) times a week. Mon, Wed, Fri  . digoxin (DIGOX) 0.125 MG tablet Take 1 tablet (0.125 mg total) by mouth daily. (Patient not taking: Reported on 12/20/2020)  . Potassium Chloride ER 20 MEQ TBCR Take by mouth. (Patient not taking: No sig reported)   No current facility-administered medications for this visit. (Other)      REVIEW OF SYSTEMS: ROS    Positive for: Cardiovascular, Eyes   Negative for: Constitutional, Gastrointestinal, Neurological, Skin, Genitourinary, Musculoskeletal, HENT, Endocrine, Respiratory, Psychiatric, Allergic/Imm, Heme/Lymph   Last edited by Leonie Douglas, COA on 12/20/2020  1:52 PM. (History)       ALLERGIES Allergies  Allergen Reactions  . Codeine Nausea And Vomiting  . Demerol [Meperidine] Nausea And Vomiting  . Morphine And Related Nausea And Vomiting  . Ciprofloxacin Rash  . Sulfa Antibiotics Rash    PAST MEDICAL HISTORY Past Medical History:  Diagnosis Date  .  A-fib ([place])   . Adenopathy   . Atrial fibrillation ([place])   . CLL (chronic lymphocytic leukemia) ([place]) 08/31/2013  . HTN (hypertension)   . Hyperlipidemia   . Lymphocytosis   . Macular degeneration    OU   Past Surgical History:  Procedure Laterality Date  . ABDOMINAL HYSTERECTOMY     endometriosis, fibroid  . APPENDECTOMY    . BREAST BIOPSY     benign cyst.  . CATARACT EXTRACTION Bilateral 2019  . CHOLECYSTECTOMY    . COLONOSCOPY  11/2012   Per Dr. Cristina Gong.  neg.   . IR FLUORO GUIDE PORT INSERTION RIGHT  08/12/2017  . IR REMOVAL TUN ACCESS W/ PORT W/O FL MOD SED  01/29/2018  . IR US GUIDE  VASC ACCESS RIGHT  08/12/2017  . THUMB ARTHROSCOPY    . TOTAL HIP ARTHROPLASTY  2010    FAMILY HISTORY Family History  Problem Relation Age of Onset  . Renal Disease Mother   . Cancer Mother        colon cancer  . Cancer Father        colon cancer    SOCIAL HISTORY Social History   Tobacco Use  . Smoking status: Never Smoker  . Smokeless tobacco: Never Used  Substance Use Topics  . Alcohol use: No  . Drug use: No         OPHTHALMIC EXAM:  Base Eye Exam    Visual Acuity (Snellen - Linear)      Right Left   Dist cc 20/20 -2 20/20 -1       Tonometry (Tonopen, 1:59 PM)      Right Left   Pressure 13 12       Pupils      Dark Light Shape React APD   Right 3 2 Round Brisk None   Left 3 2 Round Brisk None       Visual Fields (Counting fingers)      Left Right    Full Full       Extraocular Movement      Right Left    Full Full       Neuro/Psych    Oriented x3: Yes   Mood/Affect: Normal       Dilation    Both eyes: 1.0% Mydriacyl, 2.5% Phenylephrine @ 1:59 PM        Slit Lamp and Fundus Exam    Slit Lamp Exam      Right Left   Lids/Lashes Dermatochalasis - upper lid, mild Meibomian gland dysfunction Dermatochalasis - upper lid, mild Meibomian gland dysfunction   Conjunctiva/Sclera White and quiet White and quiet   Cornea Trace Debris in tear film, well healed temporal cataract wounds, 1+ pigmented [place] well healed temporal cataract wounds, 1+ Punctate epithelial erosions   Anterior Chamber Deep and quiet Deep and quiet   Iris Round and dilated Round and dilated   Lens PC IOL in good position PC IOL in good position   Vitreous Trace Vitreous syneresis, Posterior vitreous detachment Trace Vitreous syneresis, Posterior vitreous detachment       Fundus Exam      Right Left   Disc Compact, Pallor, Sharp rim, mild PPA Compact, Pink and Sharp, temporal PPP   C/D Ratio 0.1 0.2   Macula Flat, Blunted foveal reflex, Drusen, RPE mottling, clumping and  atrophy Blunted foveal reflex, +CNV with +SRH -- clearing, minimal, residual red heme inferior macula, Drusen, RPE mottling and clumping   Vessels attenuated, mild tortuousity  attenuated, mild tortuousity   Periphery Attached; mild reticular degen Attached, reticular degeneration, No heme         Refraction    Wearing Rx      Sphere Cylinder Axis Add   Right -0.25 +0.75 165 +2.50   Left -0.50 +0.50 012 +2.50   Type: PAL          IMAGING AND PROCEDURES  Imaging and Procedures for 12/20/2020  OCT, Retina - OU - Both Eyes       Right Eye Quality was good. Central Foveal Thickness: 316. Progression has been stable. Findings include no IRF, no SRF, retinal drusen , pigment epithelial detachment, outer retinal atrophy, normal foveal contour.   Left Eye Quality was good. Central Foveal Thickness: 267. Progression has improved. Findings include abnormal foveal contour, subretinal hyper-reflective material, no IRF, pigment epithelial detachment, choroidal neovascular membrane, outer retinal atrophy, retinal drusen , no SRF (Mild interval improvement in [facility] overlying PED).   Notes *Images captured and stored on drive  Diagnosis / Impression:  OD: non-exu ARMD -- prominent central PEDs OS: exu ARMD -- Mild interval improvement in [facility] overlying PED  Clinical management:  See below  Abbreviations: NFP - Normal foveal profile. CME - cystoid macular edema. PED - pigment epithelial detachment. IRF - intraretinal fluid. SRF - subretinal fluid. EZ - ellipsoid zone. ERM - epiretinal membrane. ORA - outer retinal atrophy. ORT - outer retinal tubulation. SRHM - subretinal hyper-reflective material. IRHM - intraretinal hyper-reflective material        Intravitreal Injection, Pharmacologic Agent - OS - Left Eye       Time Out 12/20/2020. 2:43 PM. Confirmed correct patient, procedure, site, and patient consented.   Anesthesia Topical anesthesia was used. Anesthetic medications included  Lidocaine 2%, Proparacaine 0.5%.   Procedure Preparation included 5% betadine to ocular surface, eyelid speculum. A (32g) needle was used.   Injection:  1.25 mg Bevacizumab (AVASTIN) 1.25mg /0.88mL SOLN   NDC: 26378-588-50, Lot: [id number] , Expiration date: 01/17/2021   Route: Intravitreal, Site: Left Eye, Waste: 0 mL  Post-op Post injection exam found visual acuity of at least counting fingers. The patient tolerated the procedure well. There were no complications. The patient received written and verbal post procedure care education.                 ASSESSMENT/PLAN:    ICD-10-CM   1. Exudative age-related macular degeneration of left eye with active choroidal neovascularization (HCC)  H35.3221 Intravitreal Injection, Pharmacologic Agent - OS - Left Eye    Bevacizumab (AVASTIN) SOLN 1.25 mg  2. Retinal edema  H35.81 OCT, Retina - OU - Both Eyes  3. Intermediate stage nonexudative age-related macular degeneration of both eyes  H35.3132   4. Essential hypertension  I10   5. Hypertensive retinopathy of both eyes  H35.033   6. Pseudophakia, both eyes  Z96.1     1,2. Exudative age related macular degeneration, OS  - [facility] pt with known history of nonexudative ARMD OU  - acute "haze over vision" OS -- onset, Friday 11.12.21 -- initial exam with central subretinal heme  - s/p IVA OS #1 (11.17.21), #2 (12.15.21), #3 (01.12.22)  - BCVA stable at 20/20 OS  - exam and OCT shows central CNV w/ subretinal hemorrhage improving  - recommend IVA OS #4 today, 02.09.22 w/ ext to 5 wks - pt wishes to be treated with IVA  - RBA of procedure discussed, questions answered - informed consent obtained and signed - see procedure  note  - f/u in 5 wks -- DFE/OCT, possible injection  3. Age related macular degeneration, non-exudative OD  - The incidence, anatomy, and pathology of dry AMD, risk of progression, and the AREDS and AREDS 2 study including smoking risks discussed with patient.  -  recommend [place] grid monitoring  4,5. Hypertensive retinopathy OU - discussed importance of tight BP control - monitor  6. Pseudophakia OU  - s/p CE/IOL OU  - IOLs in good position, doing well  - monitor  Ophthalmic Meds Ordered this visit:  Meds ordered this encounter  Medications  . Bevacizumab (AVASTIN) SOLN 1.25 mg       Return in about 5 weeks (around 01/24/2021) for f/u exu ARMD OS, DFE, OCT.  There are no Patient Instructions on file for this visit.   Explained the diagnoses, plan, and follow up with the patient and they expressed understanding.  Patient expressed understanding of the importance of proper follow up care.  This document serves as a record of services personally performed by Gardiner Sleeper, MD, PhD. It was created on their behalf by Roselee Nova, COMT. The creation of this record is the provider's dictation and/or activities during the visit.  Electronically signed by: Roselee Nova, COMT 12/20/20 3:04 PM   Gardiner Sleeper, M.D., Ph.D. Diseases & Surgery of the Retina and Vitreous [facility]  I have reviewed the above documentation for accuracy and completeness, and I agree with the above. Gardiner Sleeper, M.D., Ph.D. 12/20/20 3:04 PM   Abbreviations: M myopia (nearsighted); A astigmatism; H hyperopia (farsighted); P presbyopia; Mrx spectacle prescription;  CTL contact lenses; OD right eye; OS left eye; OU both eyes  XT exotropia; ET esotropia; PEK punctate epithelial keratitis; PEE punctate epithelial erosions; DES dry eye syndrome; MGD meibomian gland dysfunction; ATs artificial tears; PFAT's preservative free artificial tears; [place] nuclear sclerotic cataract; PSC posterior subcapsular cataract; ERM epi-retinal membrane; PVD posterior vitreous detachment; RD retinal detachment; DM diabetes mellitus; DR diabetic retinopathy; NPDR non-proliferative diabetic retinopathy; PDR proliferative diabetic retinopathy; CSME clinically  significant macular edema; DME diabetic macular edema; dbh dot blot hemorrhages; CWS cotton wool spot; POAG primary open angle glaucoma; C/D cup-to-disc ratio; HVF humphrey visual field; GVF goldmann visual field; OCT optical coherence tomography; IOP intraocular pressure; BRVO Branch retinal vein occlusion; CRVO central retinal vein occlusion; CRAO central retinal artery occlusion; BRAO branch retinal artery occlusion; RT retinal tear; SB scleral buckle; PPV pars plana vitrectomy; VH Vitreous hemorrhage; PRP panretinal laser photocoagulation; IVK intravitreal kenalog; VMT vitreomacular traction; MH Macular hole;  NVD neovascularization of the disc; NVE neovascularization elsewhere; AREDS age related eye disease study; ARMD age related macular degeneration; POAG primary open angle glaucoma; EBMD epithelial/anterior basement membrane dystrophy; ACIOL anterior chamber intraocular lens; IOL intraocular lens; PCIOL posterior chamber intraocular lens; Phaco/IOL phacoemulsification with intraocular lens placement; [place] photorefractive keratectomy; LASIK laser assisted in situ keratomileusis; HTN hypertension; DM diabetes mellitus; COPD chronic obstructive pulmonary disease

## 2020-12-28 DIAGNOSIS — C4442 Squamous cell carcinoma of skin of scalp and neck: Secondary | ICD-10-CM | POA: Diagnosis not present

## 2020-12-28 DIAGNOSIS — D044 Carcinoma in situ of skin of scalp and neck: Secondary | ICD-10-CM | POA: Diagnosis not present

## 2020-12-28 HISTORY — PX: SQUAMOUS CELL CARCINOMA EXCISION: SHX2433

## 2021-01-23 NOTE — Progress Notes (Signed)
[facility] Note  01/24/2021     CHIEF COMPLAINT Patient presents for Retina Follow Up   HISTORY OF PRESENT ILLNESS: Amanda [name] is a 75 y.o. female who presents to the clinic today for:   HPI    Retina Follow Up    Patient presents with  Wet AMD.  In left eye.  This started weeks ago.  Severity is moderate.  Duration of weeks.  Since onset it is stable.  I, the attending physician,  performed the HPI with the patient and updated documentation appropriately.          Comments    Pt states vision is the same OD, better OS.  Pt denies eye pain or discomfort and denies any new or worsening floaters or fol OU.       Last edited by Bernarda Caffey, MD on 01/24/2021  2:48 PM. (History)    pt states   Referring physician: Shirleen Schirmer, PA-C Cari Caraway, MD [place],  [place] [address]  HISTORICAL INFORMATION:   Selected notes from the MEDICAL RECORD NUMBER Referred by Shirleen Schirmer, PA on 11.16.21 for eval of dry to wet AMD conversion OS.   CURRENT MEDICATIONS: No current outpatient medications on file. (Ophthalmic Drugs)   No current facility-administered medications for this visit. (Ophthalmic Drugs)   Current Outpatient Medications (Other)  Medication Sig  . acetaminophen (TYLENOL) 500 MG tablet Take 500 mg by mouth every 6 (six) hours as needed for pain.  Marland Kitchen amLODipine (NORVASC) 5 MG tablet Take 5 mg by mouth daily.  Marland Kitchen atorvastatin (LIPITOR) 40 MG tablet Take 40 mg by mouth daily.  . Calcium Carb-Cholecalciferol (CALCIUM + D3) 600-200 MG-UNIT TABS Take 1 tablet by mouth 3 (three) times a week.   . digoxin (DIGOX) 0.125 MG tablet Take 1 tablet (0.125 mg total) by mouth daily. (Patient not taking: Reported on 12/20/2020)  . ELIQUIS 5 MG TABS tablet Take 5 mg by mouth 2 (two) times daily.  . fluticasone (FLONASE) 50 MCG/ACT nasal spray Place 2 sprays into the nose daily.  . folic acid (FOLVITE) 1 MG tablet Take 1 mg by  mouth daily.  . hydrochlorothiazide (HYDRODIURIL) 12.5 MG tablet Take 12.5 mg by mouth daily.  Marland Kitchen ibandronate (BONIVA) 150 MG tablet Take 150 mg by mouth every 30 (thirty) days.  . metoprolol (LOPRESSOR) 50 MG tablet Take 50 mg by mouth 2 (two) times daily.  . Multiple Vitamin (MULTIVITAMIN) tablet Take 1 tablet by mouth daily.  . Multiple Vitamins-Minerals (EYE VITAMINS & MINERALS) TABS Take by mouth.  . Potassium Chloride ER 20 MEQ TBCR Take by mouth. (Patient not taking: No sig reported)  . potassium chloride SA (K-DUR,KLOR-CON) 20 MEQ tablet Take 20 mEq by mouth 3 (three) times a week. Mon, Wed, Fri   No current facility-administered medications for this visit. (Other)      REVIEW OF SYSTEMS: ROS    Positive for: Cardiovascular, Eyes   Negative for: Constitutional, Gastrointestinal, Neurological, Skin, Genitourinary, Musculoskeletal, HENT, Endocrine, Respiratory, Psychiatric, Allergic/Imm, Heme/Lymph   Last edited by Doneen Poisson on 01/24/2021  2:00 PM. (History)       ALLERGIES Allergies  Allergen Reactions  . Codeine Nausea And Vomiting  . Demerol [Meperidine] Nausea And Vomiting  . Morphine And Related Nausea And Vomiting  . Ciprofloxacin Rash  . Sulfa Antibiotics Rash    PAST MEDICAL HISTORY Past Medical History:  Diagnosis Date  . A-fib ([place])   .  Adenopathy   . Atrial fibrillation ([place])   . CLL (chronic lymphocytic leukemia) ([place]) 08/31/2013  . HTN (hypertension)   . Hyperlipidemia   . Lymphocytosis   . Macular degeneration    OU   Past Surgical History:  Procedure Laterality Date  . ABDOMINAL HYSTERECTOMY     endometriosis, fibroid  . APPENDECTOMY    . BREAST BIOPSY     benign cyst.  . CATARACT EXTRACTION Bilateral 2019  . CHOLECYSTECTOMY    . COLONOSCOPY  11/2012   Per Dr. Cristina Gong.  neg.   . IR FLUORO GUIDE PORT INSERTION RIGHT  08/12/2017  . IR REMOVAL TUN ACCESS W/ PORT W/O FL MOD SED  01/29/2018  . IR US GUIDE VASC ACCESS RIGHT  08/12/2017  .  THUMB ARTHROSCOPY    . TOTAL HIP ARTHROPLASTY  2010    FAMILY HISTORY Family History  Problem Relation Age of Onset  . Renal Disease Mother   . Cancer Mother        colon cancer  . Cancer Father        colon cancer    SOCIAL HISTORY Social History   Tobacco Use  . Smoking status: Never Smoker  . Smokeless tobacco: Never Used  Substance Use Topics  . Alcohol use: No  . Drug use: No         OPHTHALMIC EXAM:  Base Eye Exam    Visual Acuity (Snellen - Linear)      Right Left   Dist cc 20/20 -2 20/20 -2   Correction: Glasses       Tonometry (Tonopen, 2:03 PM)      Right Left   Pressure 16 14       Pupils      Dark Light Shape React APD   Right 3 2 Round Brisk 0   Left 3 2 Round Brisk        Visual Fields      Left Right    Full Full       Extraocular Movement      Right Left    Full Full       Neuro/Psych    Oriented x3: Yes   Mood/Affect: Normal       Dilation    Both eyes: 1.0% Mydriacyl, 2.5% Phenylephrine @ 2:03 PM        Slit Lamp and Fundus Exam    Slit Lamp Exam      Right Left   Lids/Lashes Dermatochalasis - upper lid, mild Meibomian gland dysfunction Dermatochalasis - upper lid, mild Meibomian gland dysfunction   Conjunctiva/Sclera White and quiet White and quiet   Cornea Trace Debris in tear film, well healed temporal cataract wounds, 1+ pigmented [place] well healed temporal cataract wounds, 1+ Punctate epithelial erosions   Anterior Chamber Deep and quiet Deep and quiet   Iris Round and dilated Round and dilated   Lens PC IOL in good position PC IOL in good position   Vitreous Trace Vitreous syneresis, Posterior vitreous detachment Trace Vitreous syneresis, Posterior vitreous detachment       Fundus Exam      Right Left   Disc Compact, Pallor, Sharp rim, mild PPA Compact, Pink and Sharp, temporal PPP   C/D Ratio 0.1 0.2   Macula Flat, Blunted foveal reflex, Drusen, RPE mottling, clumping and atrophy Blunted foveal reflex, +CNV  with +SRH -- clearing, minimal -- now just punctate heme, Drusen, RPE mottling and clumping   Vessels attenuated, Tortuous attenuated, Tortuous  Periphery Attached; mild reticular degen Attached, reticular degeneration, No heme         Refraction    Wearing Rx      Sphere Cylinder Axis Add   Right -0.25 +0.75 165 +2.50   Left -0.50 +0.50 012 +2.50   Type: PAL          IMAGING AND PROCEDURES  Imaging and Procedures for 01/24/2021  OCT, Retina - OU - Both Eyes       Right Eye Quality was good. Central Foveal Thickness: 321. Progression has been stable. Findings include no IRF, no SRF, retinal drusen , pigment epithelial detachment, outer retinal atrophy, normal foveal contour.   Left Eye Quality was good. Central Foveal Thickness: 286. Progression has worsened. Findings include abnormal foveal contour, subretinal hyper-reflective material, no IRF, pigment epithelial detachment, choroidal neovascular membrane, outer retinal atrophy, retinal drusen , no SRF (Mild interval improvement in [facility], but interval increase in PED height).   Notes *Images captured and stored on drive  Diagnosis / Impression:  OD: non-exu ARMD -- prominent central PEDs OS: exu ARMD -- Mild interval improvement in [facility], but interval increase in PED height  Clinical management:  See below  Abbreviations: NFP - Normal foveal profile. CME - cystoid macular edema. PED - pigment epithelial detachment. IRF - intraretinal fluid. SRF - subretinal fluid. EZ - ellipsoid zone. ERM - epiretinal membrane. ORA - outer retinal atrophy. ORT - outer retinal tubulation. SRHM - subretinal hyper-reflective material. IRHM - intraretinal hyper-reflective material        Intravitreal Injection, Pharmacologic Agent - OS - Left Eye       Time Out 01/24/2021. 2:39 PM. Confirmed correct patient, procedure, site, and patient consented.   Anesthesia Topical anesthesia was used. Anesthetic medications included Lidocaine 2%,  Proparacaine 0.5%.   Procedure Preparation included 5% betadine to ocular surface, eyelid speculum. A (32g) needle was used.   Injection:  1.25 mg Bevacizumab (AVASTIN) 1.25mg /0.30mL SOLN   NDC: 95638-756-43, Lot: [id number] , Expiration date: 03/05/2021   Route: Intravitreal, Site: Left Eye, Waste: 0 mL  Post-op Post injection exam found visual acuity of at least counting fingers. The patient tolerated the procedure well. There were no complications. The patient received written and verbal post procedure care education.                 ASSESSMENT/PLAN:    ICD-10-CM   1. Exudative age-related macular degeneration of left eye with active choroidal neovascularization (HCC)  H35.3221 Intravitreal Injection, Pharmacologic Agent - OS - Left Eye    Bevacizumab (AVASTIN) SOLN 1.25 mg  2. Retinal edema  H35.81 OCT, Retina - OU - Both Eyes  3. Intermediate stage nonexudative age-related macular degeneration of both eyes  H35.3132   4. Essential hypertension  I10   5. Hypertensive retinopathy of both eyes  H35.033   6. Pseudophakia, both eyes  Z96.1     1,2. Exudative age related macular degeneration, OS  - [facility] pt with known history of nonexudative ARMD OU  - acute "haze over vision" OS -- onset, Friday 11.12.21 -- initial exam with central subretinal heme  - s/p IVA OS #1 (11.17.21), #2 (12.15.21), #3 (01.12.22), #4 (02.09.22)  - BCVA stable at 20/20 OS  - exam and OCT shows mild interval improvement in [facility], but interval increase in PED height at 5 wks  - ?IVA resistance, discussed switching medications  - recommend IVA OS #5 today, 03.16.22 w/ f/u in 5 wks - pt wishes to be  treated with IVA  - RBA of procedure discussed, questions answered - informed consent obtained and signed - see procedure note - pt with good insurance coverage for Eylea if switch in medication is indicated  - f/u in 5 wks -- DFE/OCT, possible injection  3. Age related macular degeneration,  non-exudative OD  - The incidence, anatomy, and pathology of dry AMD, risk of progression, and the AREDS and AREDS 2 study including smoking risks discussed with patient.  - recommend Amsler grid monitoring  4,5. Hypertensive retinopathy OU - discussed importance of tight BP control - monitor  6. Pseudophakia OU  - s/p CE/IOL OU  - IOLs in good position, doing well  - monitor  Ophthalmic Meds Ordered this visit:  Meds ordered this encounter  Medications  . Bevacizumab (AVASTIN) SOLN 1.25 mg       Return in about 5 weeks (around 02/28/2021) for f/u exu ARMD OS, DFE, OCT.  There are no Patient Instructions on file for this visit.   Explained the diagnoses, plan, and follow up with the patient and they expressed understanding.  Patient expressed understanding of the importance of proper follow up care.  This document serves as a record of services personally performed by Gardiner Sleeper, MD, PhD. It was created on their behalf by Roselee Nova, COMT. The creation of this record is the provider's dictation and/or activities during the visit.  Electronically signed by: Roselee Nova, COMT 01/24/21 4:40 PM   Gardiner Sleeper, M.D., Ph.D. Diseases & Surgery of the Retina and Vitreous [facility]  I have reviewed the above documentation for accuracy and completeness, and I agree with the above. Gardiner Sleeper, M.D., Ph.D. 01/24/21 4:40 PM   Abbreviations: M myopia (nearsighted); A astigmatism; H hyperopia (farsighted); P presbyopia; Mrx spectacle prescription;  CTL contact lenses; OD right eye; OS left eye; OU both eyes  XT exotropia; ET esotropia; PEK punctate epithelial keratitis; PEE punctate epithelial erosions; DES dry eye syndrome; MGD meibomian gland dysfunction; ATs artificial tears; PFAT's preservative free artificial tears; [place] nuclear sclerotic cataract; PSC posterior subcapsular cataract; ERM epi-retinal membrane; PVD posterior vitreous detachment; RD  retinal detachment; DM diabetes mellitus; DR diabetic retinopathy; NPDR non-proliferative diabetic retinopathy; PDR proliferative diabetic retinopathy; CSME clinically significant macular edema; DME diabetic macular edema; dbh dot blot hemorrhages; CWS cotton wool spot; POAG primary open angle glaucoma; C/D cup-to-disc ratio; HVF humphrey visual field; GVF goldmann visual field; OCT optical coherence tomography; IOP intraocular pressure; BRVO Branch retinal vein occlusion; CRVO central retinal vein occlusion; CRAO central retinal artery occlusion; BRAO branch retinal artery occlusion; RT retinal tear; SB scleral buckle; PPV pars plana vitrectomy; VH Vitreous hemorrhage; PRP panretinal laser photocoagulation; IVK intravitreal kenalog; VMT vitreomacular traction; MH Macular hole;  NVD neovascularization of the disc; NVE neovascularization elsewhere; AREDS age related eye disease study; ARMD age related macular degeneration; POAG primary open angle glaucoma; EBMD epithelial/anterior basement membrane dystrophy; ACIOL anterior chamber intraocular lens; IOL intraocular lens; PCIOL posterior chamber intraocular lens; Phaco/IOL phacoemulsification with intraocular lens placement; [place] photorefractive keratectomy; LASIK laser assisted in situ keratomileusis; HTN hypertension; DM diabetes mellitus; COPD chronic obstructive pulmonary disease

## 2021-01-24 ENCOUNTER — Other Ambulatory Visit: Payer: Self-pay

## 2021-01-24 ENCOUNTER — Ambulatory Visit (INDEPENDENT_AMBULATORY_CARE_PROVIDER_SITE_OTHER): Payer: Medicare Other | Admitting: Ophthalmology

## 2021-01-24 ENCOUNTER — Encounter (INDEPENDENT_AMBULATORY_CARE_PROVIDER_SITE_OTHER): Payer: Self-pay | Admitting: Ophthalmology

## 2021-01-24 DIAGNOSIS — Z961 Presence of intraocular lens: Secondary | ICD-10-CM | POA: Diagnosis not present

## 2021-01-24 DIAGNOSIS — H353132 Nonexudative age-related macular degeneration, bilateral, intermediate dry stage: Secondary | ICD-10-CM

## 2021-01-24 DIAGNOSIS — H3581 Retinal edema: Secondary | ICD-10-CM | POA: Diagnosis not present

## 2021-01-24 DIAGNOSIS — H353221 Exudative age-related macular degeneration, left eye, with active choroidal neovascularization: Secondary | ICD-10-CM

## 2021-01-24 DIAGNOSIS — H35033 Hypertensive retinopathy, bilateral: Secondary | ICD-10-CM

## 2021-01-24 DIAGNOSIS — I10 Essential (primary) hypertension: Secondary | ICD-10-CM

## 2021-01-24 MED ORDER — BEVACIZUMAB CHEMO INJECTION 1.25MG/0.05ML SYRINGE FOR KALEIDOSCOPE
1.2500 mg | INTRAVITREAL | Status: AC | PRN
  Administered 2021-01-24: 1.25 mg via INTRAVITREAL

## 2021-02-23 NOTE — Progress Notes (Signed)
[facility] Note  02/28/2021     CHIEF COMPLAINT Patient presents for Retina Follow Up   HISTORY OF PRESENT ILLNESS: Amanda [name] is a 75 y.o. female who presents to the clinic today for:   HPI    Retina Follow Up    Patient presents with  Wet AMD.  In left eye.  Duration of 4 weeks.  Since onset it is stable.  I, the attending physician,  performed the HPI with the patient and updated documentation appropriately.          Comments    5 week follow up Exu ARMD OS- Patient has had "multiple tiny floaters like a spray of floaters" right after last injection OS.  Denies FOL's and Vision appears stable.         Last edited by Amanda Caffey, MD on 02/28/2021 10:00 PM. (History)    pt states she has had a "zillion" floaters since her last injection, she states they are very tiny and look like a "spray"  Referring physician: Shirleen [name], Amanda Reamer, MD [name],  [place] [address]  HISTORICAL INFORMATION:   Selected notes from the [facility] Referred by Amanda Schirmer, PA on 11.16.21 for eval of dry to wet AMD conversion OS.   CURRENT MEDICATIONS: No current outpatient medications on file. (Ophthalmic Drugs)   No current facility-administered medications for this visit. (Ophthalmic Drugs)   Current Outpatient Medications (Other)  Medication Sig  . acetaminophen (TYLENOL) 500 MG tablet Take 500 mg by mouth every 6 (six) hours as needed for pain.  Marland Kitchen amLODipine (NORVASC) 5 MG tablet Take 5 mg by mouth daily.  Marland Kitchen atorvastatin (LIPITOR) 40 MG tablet Take 40 mg by mouth daily.  . Calcium Carb-Cholecalciferol (CALCIUM + D3) 600-200 MG-UNIT TABS Take 1 tablet by mouth 3 (three) times a week.   . digoxin (DIGOX) 0.125 MG tablet Take 1 tablet (0.125 mg total) by mouth daily. (Patient not taking: Reported on 12/20/2020)  . ELIQUIS 5 MG TABS tablet Take 5 mg by mouth 2 (two) times daily.  . fluticasone (FLONASE) 50  MCG/ACT nasal spray Place 2 sprays into the nose daily.  . folic acid (FOLVITE) 1 MG tablet Take 1 mg by mouth daily.  . hydrochlorothiazide (HYDRODIURIL) 12.5 MG tablet Take 12.5 mg by mouth daily.  Marland Kitchen ibandronate (BONIVA) 150 MG tablet Take 150 mg by mouth every 30 (thirty) days.  . metoprolol (LOPRESSOR) 50 MG tablet Take 50 mg by mouth 2 (two) times daily.  . Multiple Vitamin (MULTIVITAMIN) tablet Take 1 tablet by mouth daily.  . Multiple Vitamins-Minerals (EYE VITAMINS & MINERALS) TABS Take by mouth.  . Potassium Chloride ER 20 MEQ TBCR Take by mouth. (Patient not taking: No sig reported)  . potassium chloride SA (K-DUR,KLOR-CON) 20 MEQ tablet Take 20 mEq by mouth 3 (three) times a week. Mon, Wed, Fri   No current facility-administered medications for this visit. (Other)      REVIEW OF SYSTEMS: ROS    Positive for: Skin, Cardiovascular, Eyes, Heme/Lymph   Negative for: Constitutional, Gastrointestinal, Neurological, Genitourinary, Musculoskeletal, HENT, Endocrine, Respiratory, Psychiatric, Allergic/Imm   Last edited by Amanda [name], COA on 02/28/2021  1:09 PM. (History)       ALLERGIES Allergies  Allergen Reactions  . Codeine Nausea And Vomiting  . Demerol [Meperidine] Nausea And Vomiting  . Morphine And Related Nausea And Vomiting  . Ciprofloxacin Rash  . Sulfa Antibiotics Rash  PAST MEDICAL HISTORY Past Medical History:  Diagnosis Date  . A-fib ([place])   . Adenopathy   . Atrial fibrillation ([place])   . CLL (chronic lymphocytic leukemia) ([place]) 08/31/2013  . HTN (hypertension)   . Hyperlipidemia   . Lymphocytosis   . Macular degeneration    OU   Past Surgical History:  Procedure Laterality Date  . ABDOMINAL HYSTERECTOMY     endometriosis, fibroid  . APPENDECTOMY    . BREAST BIOPSY     benign cyst.  . CATARACT EXTRACTION Bilateral 2019  . CHOLECYSTECTOMY    . COLONOSCOPY  11/2012   Per Dr. Cristina [name].  neg.   . IR FLUORO GUIDE PORT INSERTION RIGHT  08/12/2017  .  IR REMOVAL TUN ACCESS W/ PORT W/O FL MOD SED  01/29/2018  . IR US GUIDE VASC ACCESS RIGHT  08/12/2017  . SQUAMOUS CELL CARCINOMA EXCISION  12/28/2020   Excised from top of head  . THUMB ARTHROSCOPY    . TOTAL HIP ARTHROPLASTY  2010    FAMILY HISTORY Family History  Problem Relation Age of Onset  . Renal Disease Mother   . Cancer Mother        colon cancer  . Cancer Father        colon cancer    SOCIAL HISTORY Social History   Tobacco Use  . Smoking status: Never Smoker  . Smokeless tobacco: Never Used  Substance Use Topics  . Alcohol use: No  . Drug use: No         OPHTHALMIC EXAM:  Base Eye Exam    Visual Acuity (Snellen - Linear)      Right Left   Dist cc 20/20 -2 20/20 -2   Correction: Glasses       Tonometry (Tonopen, 1:15 PM)      Right Left   Pressure 18 15       Pupils      Dark Light Shape React APD   Right 3 2 Round Brisk None   Left 3 2 Round Brisk None       Visual Fields (Counting fingers)      Left Right    Full Full       Extraocular Movement      Right Left    Full Full       Neuro/Psych    Oriented x3: Yes   Mood/Affect: Normal       Dilation    Both eyes: 1.0% Mydriacyl, 2.5% Phenylephrine @ 1:15 PM        Slit Lamp and Fundus Exam    Slit Lamp Exam      Right Left   Lids/Lashes Dermatochalasis - upper lid, mild Meibomian gland dysfunction Dermatochalasis - upper lid, mild Meibomian gland dysfunction   Conjunctiva/Sclera White and quiet White and quiet   Cornea Trace Debris in tear film, well healed temporal cataract wounds, 1+ pigmented Guttata well healed temporal cataract wounds, 1+ Punctate epithelial erosions   Anterior Chamber Deep and quiet Deep and quiet   Iris Round and dilated Round and dilated   Lens PC IOL in good position PC IOL in good position   Vitreous Trace Vitreous syneresis, Posterior vitreous detachment Trace Vitreous syneresis, Posterior vitreous detachment       Fundus Exam      Right Left    Disc Compact, Pallor, Sharp rim, mild PPA Compact, Pink and Sharp, temporal PPP   C/D Ratio 0.1 0.2   Macula Flat, Blunted foveal reflex, Drusen,  RPE mottling, clumping and atrophy, No heme or edema Blunted foveal reflex, +CNV with +SRH -- clearing, minimal -- now just punctate heme, Drusen, RPE mottling and clumping   Vessels attenuated, Tortuous attenuated, Tortuous   Periphery Attached; mild reticular degen Attached, reticular degeneration, No heme         Refraction    Wearing Rx      Sphere Cylinder Axis Add   Right -0.25 +0.75 165 +2.50   Left -0.50 +0.50 012 +2.50   Type: PAL          IMAGING AND PROCEDURES  Imaging and Procedures for 02/28/2021  OCT, Retina - OU - Both Eyes       Right Eye Quality was good. Central Foveal Thickness: 324. Progression has been stable. Findings include no IRF, no SRF, retinal drusen , pigment epithelial detachment, outer retinal atrophy, normal foveal contour.   Left Eye Quality was good. Central Foveal Thickness: 291. Progression has been stable. Findings include abnormal foveal contour, subretinal hyper-reflective material, no IRF, pigment epithelial detachment, choroidal neovascular membrane, outer retinal atrophy, retinal drusen , no SRF (stable improvement in [facility], persistent PED ).   Notes *Images captured and stored on drive  Diagnosis / Impression:  OD: non-exu ARMD -- prominent central PEDs OS: exu ARMD -- stable improvement in [facility], persistent PED   Clinical management:  See below  Abbreviations: NFP - Normal foveal profile. CME - cystoid macular edema. PED - pigment epithelial detachment. IRF - intraretinal fluid. SRF - subretinal fluid. EZ - ellipsoid zone. ERM - epiretinal membrane. ORA - outer retinal atrophy. ORT - outer retinal tubulation. SRHM - subretinal hyper-reflective material. IRHM - intraretinal hyper-reflective material        Intravitreal Injection, Pharmacologic Agent - OS - Left Eye       Time  Out 02/28/2021. 1:09 PM. Confirmed correct patient, procedure, site, and patient consented.   Anesthesia Topical anesthesia was used. Anesthetic medications included Lidocaine 2%, Proparacaine 0.5%.   Procedure Preparation included 5% betadine to ocular surface, eyelid speculum. A (32g) needle was used.   Injection:  1.25 mg Bevacizumab (AVASTIN) 1.25mg /0.51mL SOLN   NDC: 32440-102-72, Lot: [id number], Expiration date: 04/01/2021   Route: Intravitreal, Site: Left Eye, Waste: 0.05 mL  Post-op Post injection exam found visual acuity of at least counting fingers. The patient tolerated the procedure well. There were no complications. The patient received written and verbal post procedure care education. Post injection medications were not given.                 ASSESSMENT/PLAN:    ICD-10-CM   1. Exudative age-related macular degeneration of left eye with active choroidal neovascularization (HCC)  H35.3221 Intravitreal Injection, Pharmacologic Agent - OS - Left Eye    Bevacizumab (AVASTIN) SOLN 1.25 mg  2. Retinal edema  H35.81 OCT, Retina - OU - Both Eyes  3. Intermediate stage nonexudative age-related macular degeneration of both eyes  H35.3132   4. Essential hypertension  I10   5. Hypertensive retinopathy of both eyes  H35.033   6. Pseudophakia, both eyes  Z96.1     1,2. Exudative age related macular degeneration, OS  - [facility] pt with known history of nonexudative ARMD OU  - acute "haze over vision" OS -- onset, Friday 11.12.21 -- initial exam with central subretinal heme  - s/p IVA OS #1 (11.17.21), #2 (12.15.21), #3 (01.12.22), #4 (02.09.22), #5 (03.16.22)  - BCVA stable at 20/20 OS  - exam and OCT shows stable  improvement in [facility], and persistent PED height at 5 wks  - recommend IVA OS #6 today, 04.20.22 w/ ext f/u in 6 wks - pt wishes to be treated with IVA  - RBA of procedure discussed, questions answered - informed consent obtained and signed - see procedure  note - pt with good insurance coverage for Eylea if switch in medication is indicated  - f/u in 6 wks -- DFE/OCT, possible injection, tx and ext as able  3. Age related macular degeneration, non-exudative OD  - The incidence, anatomy, and pathology of dry AMD, risk of progression, and the AREDS and AREDS 2 study including smoking risks discussed with patient.  - recommend Amsler grid monitoring  4,5. Hypertensive retinopathy OU - discussed importance of tight BP control - monitor  6. Pseudophakia OU  - s/p CE/IOL OU  - IOLs in good position, doing well  - monitor  Ophthalmic Meds Ordered this visit:  Meds ordered this encounter  Medications  . Bevacizumab (AVASTIN) SOLN 1.25 mg       Return in about 6 weeks (around 04/11/2021) for f/u exu ARMD OS, DFE, OCT.  There are no Patient Instructions on file for this visit.   Explained the diagnoses, plan, and follow up with the patient and they expressed understanding.  Patient expressed understanding of the importance of proper follow up care.  This document serves as a record of services personally performed by Gardiner Sleeper, MD, PhD. It was created on their behalf by Roselee Nova, COMT. The creation of this record is the provider's dictation and/or activities during the visit.  Electronically signed by: Roselee Nova, COMT 02/28/21 10:05 PM   This document serves as a record of services personally performed by Gardiner Sleeper, MD, PhD. It was created on their behalf by San Jetty. Owens Shark, OA an ophthalmic technician. The creation of this record is the provider's dictation and/or activities during the visit.    Electronically signed by: San Jetty. Owens Shark, [place] 04.20.2022 10:05 PM   Gardiner Sleeper, M.D., Ph.D. Diseases & Surgery of the Retina and Vitreous [facility]  I have reviewed the above documentation for accuracy and completeness, and I agree with the above. Gardiner Sleeper, M.D., Ph.D. 02/28/21 10:07  PM    Abbreviations: M myopia (nearsighted); A astigmatism; H hyperopia (farsighted); P presbyopia; Mrx spectacle prescription;  CTL contact lenses; OD right eye; OS left eye; OU both eyes  XT exotropia; ET esotropia; PEK punctate epithelial keratitis; PEE punctate epithelial erosions; DES dry eye syndrome; MGD meibomian gland dysfunction; ATs artificial tears; PFAT's preservative free artificial tears; [place] nuclear sclerotic cataract; PSC posterior subcapsular cataract; ERM epi-retinal membrane; PVD posterior vitreous detachment; RD retinal detachment; DM diabetes mellitus; DR diabetic retinopathy; NPDR non-proliferative diabetic retinopathy; PDR proliferative diabetic retinopathy; CSME clinically significant macular edema; DME diabetic macular edema; dbh dot blot hemorrhages; CWS cotton wool spot; POAG primary open angle glaucoma; C/D cup-to-disc ratio; HVF humphrey visual field; GVF goldmann visual field; OCT optical coherence tomography; IOP intraocular pressure; BRVO Branch retinal vein occlusion; CRVO central retinal vein occlusion; CRAO central retinal artery occlusion; BRAO branch retinal artery occlusion; RT retinal tear; SB scleral buckle; PPV pars plana vitrectomy; VH Vitreous hemorrhage; PRP panretinal laser photocoagulation; IVK intravitreal kenalog; VMT vitreomacular traction; MH Macular hole;  NVD neovascularization of the disc; NVE neovascularization elsewhere; AREDS age related eye disease study; ARMD age related macular degeneration; POAG primary open angle glaucoma; EBMD epithelial/anterior basement membrane dystrophy; ACIOL anterior chamber  intraocular lens; IOL intraocular lens; PCIOL posterior chamber intraocular lens; Phaco/IOL phacoemulsification with intraocular lens placement; [place] photorefractive keratectomy; LASIK laser assisted in situ keratomileusis; HTN hypertension; DM diabetes mellitus; COPD chronic obstructive pulmonary disease

## 2021-02-26 DIAGNOSIS — I10 Essential (primary) hypertension: Secondary | ICD-10-CM | POA: Diagnosis not present

## 2021-02-26 DIAGNOSIS — I480 Paroxysmal atrial fibrillation: Secondary | ICD-10-CM | POA: Diagnosis not present

## 2021-02-26 DIAGNOSIS — E782 Mixed hyperlipidemia: Secondary | ICD-10-CM | POA: Diagnosis not present

## 2021-02-28 ENCOUNTER — Ambulatory Visit (INDEPENDENT_AMBULATORY_CARE_PROVIDER_SITE_OTHER): Payer: Medicare Other | Admitting: Ophthalmology

## 2021-02-28 ENCOUNTER — Encounter (INDEPENDENT_AMBULATORY_CARE_PROVIDER_SITE_OTHER): Payer: Self-pay | Admitting: Ophthalmology

## 2021-02-28 ENCOUNTER — Other Ambulatory Visit: Payer: Self-pay

## 2021-02-28 DIAGNOSIS — H3581 Retinal edema: Secondary | ICD-10-CM

## 2021-02-28 DIAGNOSIS — H353221 Exudative age-related macular degeneration, left eye, with active choroidal neovascularization: Secondary | ICD-10-CM

## 2021-02-28 DIAGNOSIS — H353132 Nonexudative age-related macular degeneration, bilateral, intermediate dry stage: Secondary | ICD-10-CM | POA: Diagnosis not present

## 2021-02-28 DIAGNOSIS — Z961 Presence of intraocular lens: Secondary | ICD-10-CM | POA: Diagnosis not present

## 2021-02-28 DIAGNOSIS — H35033 Hypertensive retinopathy, bilateral: Secondary | ICD-10-CM | POA: Diagnosis not present

## 2021-02-28 DIAGNOSIS — I10 Essential (primary) hypertension: Secondary | ICD-10-CM | POA: Diagnosis not present

## 2021-02-28 MED ORDER — BEVACIZUMAB CHEMO INJECTION 1.25MG/0.05ML SYRINGE FOR KALEIDOSCOPE
1.2500 mg | INTRAVITREAL | Status: AC | PRN
  Administered 2021-02-28: 1.25 mg via INTRAVITREAL

## 2021-03-09 DIAGNOSIS — Z23 Encounter for immunization: Secondary | ICD-10-CM | POA: Diagnosis not present

## 2021-03-29 ENCOUNTER — Telehealth: Payer: Self-pay | Admitting: Hematology and Oncology

## 2021-03-29 NOTE — Telephone Encounter (Signed)
R/s appts per 5/19 sch msg. Pt aware.  

## 2021-04-10 NOTE — Progress Notes (Signed)
[facility] Note  04/11/2021     CHIEF COMPLAINT Patient presents for Retina Follow Up   HISTORY OF PRESENT ILLNESS: Amanda [name] is a 75 y.o. female who presents to the clinic today for:   HPI    Retina Follow Up    Patient presents with  Wet AMD.  In left eye.  Duration of 6 weeks.  Since onset it is stable.  I, the attending physician,  performed the HPI with the patient and updated documentation appropriately.          Comments    Pt here for 6 week retinal follow up exu ARMD. Pt states vision is the same, no changes or issues noted. Still seeing floaters in OS which was reported the visit before last. No ocular pain or discomfort.        Last edited by Bernarda Caffey, MD on 04/13/2021  2:17 AM. (History)    Pt's vision OU is doing very well.  Pt has appt w/cardiologist in September to see if she can maybe get off the Eliquis.  Pt still has lots of tiny floaters OS.  Referring physician: Shirleen Schirmer, PA-C [facility] [address] [place],  [place] [address]  HISTORICAL INFORMATION:   Selected notes from the MEDICAL RECORD NUMBER Referred by Shirleen Schirmer, PA on 11.16.21 for eval of dry to wet AMD conversion OS.   CURRENT MEDICATIONS: No current outpatient medications on file. (Ophthalmic Drugs)   No current facility-administered medications for this visit. (Ophthalmic Drugs)   Current Outpatient Medications (Other)  Medication Sig  . acetaminophen (TYLENOL) 500 MG tablet Take 500 mg by mouth every 6 (six) hours as needed for pain.  Marland Kitchen amLODipine (NORVASC) 5 MG tablet Take 5 mg by mouth daily.  Marland Kitchen atorvastatin (LIPITOR) 40 MG tablet Take 40 mg by mouth daily.  . Calcium Carb-Cholecalciferol (CALCIUM + D3) 600-200 MG-UNIT TABS Take 1 tablet by mouth 3 (three) times a week.   . digoxin (DIGOX) 0.125 MG tablet Take 1 tablet (0.125 mg total) by mouth daily. (Patient not taking: Reported on 12/20/2020)  . ELIQUIS 5 MG  TABS tablet Take 5 mg by mouth 2 (two) times daily.  . fluticasone (FLONASE) 50 MCG/ACT nasal spray Place 2 sprays into the nose daily.  . folic acid (FOLVITE) 1 MG tablet Take 1 mg by mouth daily.  . hydrochlorothiazide (HYDRODIURIL) 12.5 MG tablet Take 12.5 mg by mouth daily.  Marland Kitchen ibandronate (BONIVA) 150 MG tablet Take 150 mg by mouth every 30 (thirty) days.  . metoprolol (LOPRESSOR) 50 MG tablet Take 50 mg by mouth 2 (two) times daily.  . Multiple Vitamin (MULTIVITAMIN) tablet Take 1 tablet by mouth daily.  . Multiple Vitamins-Minerals (EYE VITAMINS & MINERALS) TABS Take by mouth.  . Potassium Chloride ER 20 MEQ TBCR Take by mouth. (Patient not taking: No sig reported)  . potassium chloride SA (K-DUR,KLOR-CON) 20 MEQ tablet Take 20 mEq by mouth 3 (three) times a week. Mon, Wed, Fri   No current facility-administered medications for this visit. (Other)      REVIEW OF SYSTEMS: ROS    Positive for: Skin, Cardiovascular, Eyes, Heme/Lymph   Negative for: Constitutional, Gastrointestinal, Neurological, Genitourinary, Musculoskeletal, HENT, Endocrine, Respiratory, Psychiatric, Allergic/Imm   Last edited by Kingsley Spittle, COT on 04/11/2021  2:05 PM. (History)       ALLERGIES Allergies  Allergen Reactions  . Codeine Nausea And Vomiting  . Demerol [Meperidine]  Nausea And Vomiting  . Morphine And Related Nausea And Vomiting  . Ciprofloxacin Rash  . Sulfa Antibiotics Rash    PAST MEDICAL HISTORY Past Medical History:  Diagnosis Date  . A-fib ([place])   . Adenopathy   . Atrial fibrillation ([place])   . CLL (chronic lymphocytic leukemia) ([place]) 08/31/2013  . HTN (hypertension)   . Hyperlipidemia   . Lymphocytosis   . Macular degeneration    OU   Past Surgical History:  Procedure Laterality Date  . ABDOMINAL HYSTERECTOMY     endometriosis, fibroid  . APPENDECTOMY    . BREAST BIOPSY     benign cyst.  . CATARACT EXTRACTION Bilateral 2019  . CHOLECYSTECTOMY    . COLONOSCOPY   11/2012   Per Dr. Cristina Gong.  neg.   . IR FLUORO GUIDE PORT INSERTION RIGHT  08/12/2017  . IR REMOVAL TUN ACCESS W/ PORT W/O FL MOD SED  01/29/2018  . IR US GUIDE VASC ACCESS RIGHT  08/12/2017  . SQUAMOUS CELL CARCINOMA EXCISION  12/28/2020   Excised from top of head  . THUMB ARTHROSCOPY    . TOTAL HIP ARTHROPLASTY  2010    FAMILY HISTORY Family History  Problem Relation Age of Onset  . Renal Disease Mother   . Cancer Mother        colon cancer  . Cancer Father        colon cancer    SOCIAL HISTORY Social History   Tobacco Use  . Smoking status: Never Smoker  . Smokeless tobacco: Never Used  Substance Use Topics  . Alcohol use: No  . Drug use: No         OPHTHALMIC EXAM:  Base Eye Exam    Visual Acuity (Snellen - Linear)      Right Left   Dist cc 20/25 -2 20/20 -2   Dist ph cc 20/25    Correction: Glasses       Tonometry (Tonopen, 2:11 PM)      Right Left   Pressure 15 14       Pupils      Dark Light Shape React APD   Right 3 2 Round Brisk None   Left 3 2 Round Brisk None       Visual Fields (Counting fingers)      Left Right    Full Full       Extraocular Movement      Right Left    Full, Ortho Full, Ortho       Neuro/Psych    Oriented x3: Yes   Mood/Affect: Normal       Dilation    Both eyes: 1.0% Mydriacyl, 2.5% Phenylephrine @ 2:11 PM        Slit Lamp and Fundus Exam    Slit Lamp Exam      Right Left   Lids/Lashes Dermatochalasis - upper lid, mild Meibomian gland dysfunction Dermatochalasis - upper lid, mild Meibomian gland dysfunction   Conjunctiva/Sclera White and quiet White and quiet   Cornea Trace Debris in tear film, well healed temporal cataract wounds, 1+ pigmented Guttata well healed temporal cataract wounds, 1+ Punctate epithelial erosions   Anterior Chamber Deep and quiet Deep and quiet   Iris Round and dilated Round and dilated   Lens PC IOL in good position PC IOL in good position   Vitreous Trace Vitreous syneresis,  Posterior vitreous detachment Trace Vitreous syneresis, Posterior vitreous detachment       Fundus Exam  Right Left   Disc Compact, Pallor, Sharp rim, mild PPA Compact, Pink and Sharp, temporal PPP   C/D Ratio 0.1 0.2   Macula Flat, Blunted foveal reflex, Drusen, RPE mottling, clumping and atrophy, No heme or edema Blunted foveal reflex, +CNV with +SRH -- clearing, minimal -- now just punctate heme, Drusen, RPE mottling and clumping; interval increase in SRF overlying PED   Vessels attenuated, Tortuous attenuated, Tortuous   Periphery Attached; mild reticular degen Attached, reticular degeneration, No heme         Refraction    Wearing Rx      Sphere Cylinder Axis Add   Right -0.25 +0.75 165 +2.50   Left -0.50 +0.50 012 +2.50   Type: PAL          IMAGING AND PROCEDURES  Imaging and Procedures for 04/11/2021  OCT, Retina - OU - Both Eyes       Right Eye Quality was good. Central Foveal Thickness: 318. Progression has been stable. Findings include no IRF, no SRF, retinal drusen , pigment epithelial detachment, outer retinal atrophy, normal foveal contour.   Left Eye Quality was good. Central Foveal Thickness: 279. Progression has worsened. Findings include abnormal foveal contour, subretinal hyper-reflective material, no IRF, pigment epithelial detachment, choroidal neovascular membrane, outer retinal atrophy, retinal drusen , subretinal fluid (Interval increase in SRF overlying stable PED).   Notes *Images captured and stored on drive  Diagnosis / Impression:  OD: non-exu ARMD -- prominent central PEDs OS: Interval increase in SRF overlying stable PED  Clinical management:  See below  Abbreviations: NFP - Normal foveal profile. CME - cystoid macular edema. PED - pigment epithelial detachment. IRF - intraretinal fluid. SRF - subretinal fluid. EZ - ellipsoid zone. ERM - epiretinal membrane. ORA - outer retinal atrophy. ORT - outer retinal tubulation. SRHM - subretinal  hyper-reflective material. IRHM - intraretinal hyper-reflective material        Intravitreal Injection, Pharmacologic Agent - OS - Left Eye       Time Out 04/11/2021. 2:49 PM. Confirmed correct patient, procedure, site, and patient consented.   Anesthesia Topical anesthesia was used. Anesthetic medications included Lidocaine 2%, Proparacaine 0.5%.   Procedure Preparation included 5% betadine to ocular surface, eyelid speculum. A supplied needle was used.   Injection:  1.25 mg Bevacizumab (AVASTIN) 1.25mg /0.76mL SOLN   NDC: H061816, Lot: [id number] , Expiration date: 05/23/2021   Route: Intravitreal, Site: Left Eye, Waste: 0 mL  Post-op Post injection exam found visual acuity of at least counting fingers. The patient tolerated the procedure well. There were no complications. The patient received written and verbal post procedure care education. Post injection medications were not given.                 ASSESSMENT/PLAN:    ICD-10-CM   1. Exudative age-related macular degeneration of left eye with active choroidal neovascularization (HCC)  H35.3221 Intravitreal Injection, Pharmacologic Agent - OS - Left Eye    Bevacizumab (AVASTIN) SOLN 1.25 mg  2. Retinal edema  H35.81 OCT, Retina - OU - Both Eyes  3. Intermediate stage nonexudative age-related macular degeneration of both eyes  H35.3132   4. Essential hypertension  I10   5. Hypertensive retinopathy of both eyes  H35.033   6. Pseudophakia, both eyes  Z96.1    1,2. Exudative age related macular degeneration, OS  - [facility] pt with known history of nonexudative ARMD OU  - acute "haze over vision" OS -- onset, Friday 11.12.21 -- initial exam with  central subretinal heme  - s/p IVA OS #1 (11.17.21), #2 (12.15.21), #3 (01.12.22), #4 (02.09.22), #5 (03.16.22), #6 (04.20.22)  - BCVA stable at 20/20 OS  - exam and OCT shows interval increase in SRF overlying stable PED  - recommend IVA OS #7 today, 06.01.22 w/ dec in  interval back to 4 wks - pt wishes to be treated with IVA OS - RBA of procedure discussed, questions answered - informed consent obtained and signed - see procedure note - pt with good insurance coverage for Eylea if switch in medication is indicated  - f/u in 4 wks -- DFE/OCT, possible injection, tx and ext as able  3. Age related macular degeneration, non-exudative OD  - The incidence, anatomy, and pathology of dry AMD, risk of progression, and the AREDS and AREDS 2 study including smoking risks discussed with patient.  - recommend Amsler grid monitoring  4,5. Hypertensive retinopathy OU - discussed importance of tight BP control - monitor  6. Pseudophakia OU  - s/p CE/IOL OU  - IOLs in good position, doing well  - monitor  Ophthalmic Meds Ordered this visit:  Meds ordered this encounter  Medications  . Bevacizumab (AVASTIN) SOLN 1.25 mg      Return in about 4 weeks (around 05/09/2021) for 4 wk f/u for exu ARMD OS w/DFE/OCT/poss inj..  There are no Patient Instructions on file for this visit.  Explained the diagnoses, plan, and follow up with the patient and they expressed understanding.  Patient expressed understanding of the importance of proper follow up care.  This document serves as a record of services personally performed by Gardiner Sleeper, MD, PhD. It was created on their behalf by Roselee Nova, COMT. The creation of this record is the provider's dictation and/or activities during the visit.  Electronically signed by: Roselee Nova, COMT 04/13/21 2:20 AM   This document serves as a record of services personally performed by Gardiner Sleeper, MD, PhD. It was created on their behalf by San Jetty. Owens Shark, OA an ophthalmic technician. The creation of this record is the provider's dictation and/or activities during the visit.    Electronically signed by: San Jetty. Owens Shark, OA 6.1.22 @ 2:20 AM  Gardiner Sleeper, M.D., Ph.D. Diseases & Surgery of the Retina and Vitreous [facility]  [place] 6.1.22  I have reviewed the above documentation for accuracy and completeness, and I agree with the above. Gardiner Sleeper, M.D., Ph.D. 04/13/21 2:20 AM  Abbreviations: M myopia (nearsighted); A astigmatism; H hyperopia (farsighted); P presbyopia; Mrx spectacle prescription;  CTL contact lenses; OD right eye; OS left eye; OU both eyes  XT exotropia; ET esotropia; PEK punctate epithelial keratitis; PEE punctate epithelial erosions; DES dry eye syndrome; MGD meibomian gland dysfunction; ATs artificial tears; PFAT's preservative free artificial tears; [place] nuclear sclerotic cataract; PSC posterior subcapsular cataract; ERM epi-retinal membrane; PVD posterior vitreous detachment; RD retinal detachment; DM diabetes mellitus; DR diabetic retinopathy; NPDR non-proliferative diabetic retinopathy; PDR proliferative diabetic retinopathy; CSME clinically significant macular edema; DME diabetic macular edema; dbh dot blot hemorrhages; CWS cotton wool spot; POAG primary open angle glaucoma; C/D cup-to-disc ratio; HVF humphrey visual field; GVF goldmann visual field; OCT optical coherence tomography; IOP intraocular pressure; BRVO Branch retinal vein occlusion; CRVO central retinal vein occlusion; CRAO central retinal artery occlusion; BRAO branch retinal artery occlusion; RT retinal tear; SB scleral buckle; PPV pars plana vitrectomy; VH Vitreous hemorrhage; PRP panretinal laser photocoagulation; IVK intravitreal kenalog; VMT vitreomacular traction; MH Macular hole;  NVD neovascularization of the disc; NVE neovascularization elsewhere; AREDS age related eye disease study; ARMD age related macular degeneration; POAG primary open angle glaucoma; EBMD epithelial/anterior basement membrane dystrophy; ACIOL anterior chamber intraocular lens; IOL intraocular lens; PCIOL posterior chamber intraocular lens; Phaco/IOL phacoemulsification with intraocular lens placement; [place] photorefractive keratectomy;  LASIK laser assisted in situ keratomileusis; HTN hypertension; DM diabetes mellitus; COPD chronic obstructive pulmonary disease

## 2021-04-11 ENCOUNTER — Other Ambulatory Visit: Payer: Self-pay

## 2021-04-11 ENCOUNTER — Encounter (INDEPENDENT_AMBULATORY_CARE_PROVIDER_SITE_OTHER): Payer: Self-pay | Admitting: Ophthalmology

## 2021-04-11 ENCOUNTER — Ambulatory Visit (INDEPENDENT_AMBULATORY_CARE_PROVIDER_SITE_OTHER): Payer: Medicare Other | Admitting: Ophthalmology

## 2021-04-11 DIAGNOSIS — I10 Essential (primary) hypertension: Secondary | ICD-10-CM

## 2021-04-11 DIAGNOSIS — H353221 Exudative age-related macular degeneration, left eye, with active choroidal neovascularization: Secondary | ICD-10-CM | POA: Diagnosis not present

## 2021-04-11 DIAGNOSIS — H3581 Retinal edema: Secondary | ICD-10-CM | POA: Diagnosis not present

## 2021-04-11 DIAGNOSIS — Z961 Presence of intraocular lens: Secondary | ICD-10-CM | POA: Diagnosis not present

## 2021-04-11 DIAGNOSIS — H353132 Nonexudative age-related macular degeneration, bilateral, intermediate dry stage: Secondary | ICD-10-CM | POA: Diagnosis not present

## 2021-04-11 DIAGNOSIS — H35033 Hypertensive retinopathy, bilateral: Secondary | ICD-10-CM | POA: Diagnosis not present

## 2021-04-13 ENCOUNTER — Encounter (INDEPENDENT_AMBULATORY_CARE_PROVIDER_SITE_OTHER): Payer: Self-pay | Admitting: Ophthalmology

## 2021-04-13 DIAGNOSIS — H353221 Exudative age-related macular degeneration, left eye, with active choroidal neovascularization: Secondary | ICD-10-CM | POA: Diagnosis not present

## 2021-04-13 MED ORDER — BEVACIZUMAB CHEMO INJECTION 1.25MG/0.05ML SYRINGE FOR KALEIDOSCOPE
1.2500 mg | INTRAVITREAL | Status: AC | PRN
  Administered 2021-04-13: 1.25 mg via INTRAVITREAL

## 2021-05-08 DIAGNOSIS — E782 Mixed hyperlipidemia: Secondary | ICD-10-CM | POA: Diagnosis not present

## 2021-05-08 DIAGNOSIS — I10 Essential (primary) hypertension: Secondary | ICD-10-CM | POA: Diagnosis not present

## 2021-05-08 DIAGNOSIS — I480 Paroxysmal atrial fibrillation: Secondary | ICD-10-CM | POA: Diagnosis not present

## 2021-05-08 NOTE — Progress Notes (Signed)
[facility] Note  05/09/2021     CHIEF COMPLAINT Patient presents for Retina Follow Up   HISTORY OF PRESENT ILLNESS: Amanda [name] is a 75 y.o. female who presents to the clinic today for:   HPI     Retina Follow Up   Patient presents with  Wet AMD.  In left eye.  This started 4 weeks ago.  I, the attending physician,  performed the HPI with the patient and updated documentation appropriately.        Comments   Patient here for 4 weeks retina follow up for exu ARMD OS. Patient states vision doing fine. Did have eye pain- more like a bruise after last injection. Took several days to go away.      Last edited by Bernarda Caffey, MD on 05/09/2021  4:57 PM.     Pt states vision is stable  Referring physician: Shirleen Schirmer, Joana Reamer, MD [place],  [place] [address]  HISTORICAL INFORMATION:   Selected notes from the MEDICAL RECORD NUMBER Referred by Shirleen Schirmer, PA on 11.16.21 for eval of dry to wet AMD conversion OS.   CURRENT MEDICATIONS: No current outpatient medications on file. (Ophthalmic Drugs)   No current facility-administered medications for this visit. (Ophthalmic Drugs)   Current Outpatient Medications (Other)  Medication Sig   acetaminophen (TYLENOL) 500 MG tablet Take 500 mg by mouth every 6 (six) hours as needed for pain.   amLODipine (NORVASC) 5 MG tablet Take 5 mg by mouth daily.   atorvastatin (LIPITOR) 40 MG tablet Take 40 mg by mouth daily.   Calcium Carb-Cholecalciferol (CALCIUM + D3) 600-200 MG-UNIT TABS Take 1 tablet by mouth 3 (three) times a week.    digoxin (DIGOX) 0.125 MG tablet Take 1 tablet (0.125 mg total) by mouth daily. (Patient not taking: Reported on 12/20/2020)   ELIQUIS 5 MG TABS tablet Take 5 mg by mouth 2 (two) times daily.   fluticasone (FLONASE) 50 MCG/ACT nasal spray Place 2 sprays into the nose daily.   folic acid (FOLVITE) 1 MG tablet Take 1 mg by mouth daily.    hydrochlorothiazide (HYDRODIURIL) 12.5 MG tablet Take 12.5 mg by mouth daily.   ibandronate (BONIVA) 150 MG tablet Take 150 mg by mouth every 30 (thirty) days.   metoprolol (LOPRESSOR) 50 MG tablet Take 50 mg by mouth 2 (two) times daily.   Multiple Vitamin (MULTIVITAMIN) tablet Take 1 tablet by mouth daily.   Multiple Vitamins-Minerals (EYE VITAMINS & MINERALS) TABS Take by mouth.   Potassium Chloride ER 20 MEQ TBCR Take by mouth. (Patient not taking: No sig reported)   potassium chloride SA (K-DUR,KLOR-CON) 20 MEQ tablet Take 20 mEq by mouth 3 (three) times a week. Mon, Wed, Fri   No current facility-administered medications for this visit. (Other)      REVIEW OF SYSTEMS: ROS   Positive for: Skin, Cardiovascular, Eyes, Heme/Lymph Negative for: Constitutional, Gastrointestinal, Neurological, Genitourinary, Musculoskeletal, HENT, Endocrine, Respiratory, Psychiatric, Allergic/Imm Last edited by Theodore Demark, COA on 05/09/2021  2:07 PM.        ALLERGIES Allergies  Allergen Reactions   Codeine Nausea And Vomiting   Demerol [Meperidine] Nausea And Vomiting   Morphine And Related Nausea And Vomiting   Ciprofloxacin Rash   Sulfa Antibiotics Rash    PAST MEDICAL HISTORY Past Medical History:  Diagnosis Date   A-fib ([facility])    Adenopathy    Atrial fibrillation ([place])    CLL (  chronic lymphocytic leukemia) ([place]) 08/31/2013   HTN (hypertension)    Hyperlipidemia    Lymphocytosis    Macular degeneration    OU   Past Surgical History:  Procedure Laterality Date   ABDOMINAL HYSTERECTOMY     endometriosis, fibroid   APPENDECTOMY     BREAST BIOPSY     benign cyst.   CATARACT EXTRACTION Bilateral 2019   CHOLECYSTECTOMY     COLONOSCOPY  11/2012   Per Dr. Cristina Gong.  neg.    IR FLUORO GUIDE PORT INSERTION RIGHT  08/12/2017   IR REMOVAL TUN ACCESS W/ PORT W/O FL MOD SED  01/29/2018   IR US GUIDE VASC ACCESS RIGHT  08/12/2017   SQUAMOUS CELL CARCINOMA EXCISION  12/28/2020   Excised  from top of head   THUMB ARTHROSCOPY     TOTAL HIP ARTHROPLASTY  2010    FAMILY HISTORY Family History  Problem Relation Age of Onset   Renal Disease Mother    Cancer Mother        colon cancer   Cancer Father        colon cancer    SOCIAL HISTORY Social History   Tobacco Use   Smoking status: Never   Smokeless tobacco: Never  Substance Use Topics   Alcohol use: No   Drug use: No         OPHTHALMIC EXAM:  Base Eye Exam     Visual Acuity (Snellen - Linear)       Right Left   Dist cc 20/25 -2 20/25 -1   Dist ph cc NI NI    Correction: Glasses         Tonometry (Tonopen, 2:04 PM)       Right Left   Pressure 14 13         Pupils       Dark Light Shape React APD   Right 3 2 Round Brisk None   Left 3 2 Round Brisk None         Visual Fields (Counting fingers)       Left Right    Full Full         Extraocular Movement       Right Left    Full, Ortho Full, Ortho         Neuro/Psych     Oriented x3: Yes   Mood/Affect: Normal         Dilation     Both eyes: 1.0% Mydriacyl, 2.5% Phenylephrine @ 2:04 PM           Slit Lamp and Fundus Exam     Slit Lamp Exam       Right Left   Lids/Lashes Dermatochalasis - upper lid, mild Meibomian gland dysfunction Dermatochalasis - upper lid, mild Meibomian gland dysfunction   Conjunctiva/Sclera White and quiet White and quiet   Cornea Trace Debris in tear film, well healed temporal cataract wounds, 1+ pigmented [place] well healed temporal cataract wounds, 1+ Punctate epithelial erosions   Anterior Chamber Deep and quiet Deep and quiet   Iris Round and dilated Round and dilated   Lens PC IOL in good position PC IOL in good position   Vitreous Trace Vitreous syneresis, Posterior vitreous detachment Trace Vitreous syneresis, Posterior vitreous detachment         Fundus Exam       Right Left   Disc Compact, Pallor, Sharp rim, mild PPA Compact, Pink and Sharp, temporal PPP   C/D Ratio  0.1  0.2   Macula Flat, Blunted foveal reflex, Drusen, RPE mottling, clumping and atrophy, No heme or edema Blunted foveal reflex, +CNV with +SRH -- resolved, Drusen, RPE mottling and clumping; interval improvement in SRF overlying PED   Vessels attenuated, Tortuous attenuated, Tortuous   Periphery Attached; mild reticular degen Attached, reticular degeneration, No heme            Refraction     Wearing Rx       Sphere Cylinder Axis Add   Right -0.25 +0.75 165 +2.50   Left -0.50 +0.50 012 +2.50    Type: PAL            IMAGING AND PROCEDURES  Imaging and Procedures for 05/09/2021  OCT, Retina - OU - Both Eyes       Right Eye Quality was good. Central Foveal Thickness: 320. Progression has been stable. Findings include no IRF, no SRF, retinal drusen , pigment epithelial detachment, outer retinal atrophy, normal foveal contour.   Left Eye Quality was good. Central Foveal Thickness: 273. Progression has improved. Findings include abnormal foveal contour, subretinal hyper-reflective material, no IRF, pigment epithelial detachment, choroidal neovascular membrane, outer retinal atrophy, retinal drusen , subretinal fluid (Mild Interval improvement in SRF and PED).   Notes *Images captured and stored on drive  Diagnosis / Impression:  OD: non-exu ARMD -- prominent central PEDs OS: Mild Interval improvement in SRF and PED  Clinical management:  See below  Abbreviations: NFP - Normal foveal profile. CME - cystoid macular edema. PED - pigment epithelial detachment. IRF - intraretinal fluid. SRF - subretinal fluid. EZ - ellipsoid zone. ERM - epiretinal membrane. ORA - outer retinal atrophy. ORT - outer retinal tubulation. SRHM - subretinal hyper-reflective material. IRHM - intraretinal hyper-reflective material      Intravitreal Injection, Pharmacologic Agent - OS - Left Eye       Time Out 05/09/2021. 2:41 PM. Confirmed correct patient, procedure, site, and patient consented.    Anesthesia Topical anesthesia was used. Anesthetic medications included Lidocaine 2%, Proparacaine 0.5%.   Procedure Preparation included 5% betadine to ocular surface, eyelid speculum. A supplied needle was used.   Injection: 1.25 mg Bevacizumab 1.25mg /0.50ml   Route: Intravitreal, Site: Left Eye   NDC: H061816, Lot: [id number] , Expiration date: 06/20/2021, Waste: 0.05 mL   Post-op Post injection exam found visual acuity of at least counting fingers. The patient tolerated the procedure well. There were no complications. The patient received written and verbal post procedure care education. Post injection medications were not given.               ASSESSMENT/PLAN:    ICD-10-CM   1. Exudative age-related macular degeneration of left eye with active choroidal neovascularization (HCC)  H35.3221 Intravitreal Injection, Pharmacologic Agent - OS - Left Eye    Bevacizumab (AVASTIN) SOLN 1.25 mg    2. Retinal edema  H35.81 OCT, Retina - OU - Both Eyes    3. Intermediate stage nonexudative age-related macular degeneration of both eyes  H35.3132     4. Essential hypertension  I10     5. Hypertensive retinopathy of both eyes  H35.033     6. Pseudophakia, both eyes  Z96.1      1,2. Exudative age related macular degeneration, OS  - [facility] pt with known history of nonexudative ARMD OU  - acute "haze over vision" OS -- onset, Friday 11.12.21 -- initial exam with central subretinal heme  - s/p IVA OS #1 (11.17.21), #2 (12.15.21), #3 (01.12.22), #  4 (02.09.22), #5 (03.16.22), #6 (04.20.22), #7 (06.01.22)  - BCVA 20/25 from 20/20 OS  - exam and OCT shows mild Interval improvement in SRF and PED  - recommend IVA OS #8 today, 06.29.22 w/ f/u in 4 wks - pt wishes to be treated with IVA OS - RBA of procedure discussed, questions answered - informed consent obtained and signed - see procedure note - pt with good insurance coverage for Eylea if switch in medication is  indicated  - f/u in 4 wks -- DFE/OCT, possible injection, tx and ext as able  3. Age related macular degeneration, non-exudative OD  - The incidence, anatomy, and pathology of dry AMD, risk of progression, and the AREDS and AREDS 2 study including smoking risks discussed with patient.  - recommend Amsler grid monitoring  4,5. Hypertensive retinopathy OU - discussed importance of tight BP control - monitor  6. Pseudophakia OU  - s/p CE/IOL OU  - IOLs in good position, doing well  - monitor  Ophthalmic Meds Ordered this visit:  Meds ordered this encounter  Medications   Bevacizumab (AVASTIN) SOLN 1.25 mg       Return in about 4 weeks (around 06/06/2021) for f/u exu ARMD OS, DFE, OCT.  There are no Patient Instructions on file for this visit.  Explained the diagnoses, plan, and follow up with the patient and they expressed understanding.  Patient expressed understanding of the importance of proper follow up care.  This document serves as a record of services personally performed by Gardiner Sleeper, MD, PhD. It was created on their behalf by Roselee Nova, COMT. The creation of this record is the provider's dictation and/or activities during the visit.  Electronically signed by: Roselee Nova, COMT 05/09/21 5:00 PM   Gardiner Sleeper, M.D., Ph.D. Diseases & Surgery of the Retina and [place]  I have reviewed the above documentation for accuracy and completeness, and I agree with the above. Gardiner Sleeper, M.D., Ph.D. 05/09/21 5:00 PM   Abbreviations: M myopia (nearsighted); A astigmatism; H hyperopia (farsighted); P presbyopia; Mrx spectacle prescription;  CTL contact lenses; OD right eye; OS left eye; OU both eyes  XT exotropia; ET esotropia; PEK punctate epithelial keratitis; PEE punctate epithelial erosions; DES dry eye syndrome; MGD meibomian gland dysfunction; ATs artificial tears; PFAT's preservative free artificial tears; [place] nuclear  sclerotic cataract; PSC posterior subcapsular cataract; ERM epi-retinal membrane; PVD posterior vitreous detachment; RD retinal detachment; DM diabetes mellitus; DR diabetic retinopathy; NPDR non-proliferative diabetic retinopathy; PDR proliferative diabetic retinopathy; CSME clinically significant macular edema; DME diabetic macular edema; dbh dot blot hemorrhages; CWS cotton wool spot; POAG primary open angle glaucoma; C/D cup-to-disc ratio; HVF humphrey visual field; GVF goldmann visual field; OCT optical coherence tomography; IOP intraocular pressure; BRVO Branch retinal vein occlusion; CRVO central retinal vein occlusion; CRAO central retinal artery occlusion; BRAO branch retinal artery occlusion; RT retinal tear; SB scleral buckle; PPV pars plana vitrectomy; VH Vitreous hemorrhage; PRP panretinal laser photocoagulation; IVK intravitreal kenalog; VMT vitreomacular traction; MH Macular hole;  NVD neovascularization of the disc; NVE neovascularization elsewhere; AREDS age related eye disease study; ARMD age related macular degeneration; POAG primary open angle glaucoma; EBMD epithelial/anterior basement membrane dystrophy; ACIOL anterior chamber intraocular lens; IOL intraocular lens; PCIOL posterior chamber intraocular lens; Phaco/IOL phacoemulsification with intraocular lens placement; [place] photorefractive keratectomy; LASIK laser assisted in situ keratomileusis; HTN hypertension; DM diabetes mellitus; COPD chronic obstructive pulmonary disease

## 2021-05-09 ENCOUNTER — Other Ambulatory Visit: Payer: Self-pay

## 2021-05-09 ENCOUNTER — Ambulatory Visit (INDEPENDENT_AMBULATORY_CARE_PROVIDER_SITE_OTHER): Payer: Medicare Other | Admitting: Ophthalmology

## 2021-05-09 ENCOUNTER — Encounter (INDEPENDENT_AMBULATORY_CARE_PROVIDER_SITE_OTHER): Payer: Self-pay | Admitting: Ophthalmology

## 2021-05-09 DIAGNOSIS — H35033 Hypertensive retinopathy, bilateral: Secondary | ICD-10-CM

## 2021-05-09 DIAGNOSIS — I10 Essential (primary) hypertension: Secondary | ICD-10-CM | POA: Diagnosis not present

## 2021-05-09 DIAGNOSIS — H3581 Retinal edema: Secondary | ICD-10-CM

## 2021-05-09 DIAGNOSIS — H353132 Nonexudative age-related macular degeneration, bilateral, intermediate dry stage: Secondary | ICD-10-CM

## 2021-05-09 DIAGNOSIS — Z961 Presence of intraocular lens: Secondary | ICD-10-CM

## 2021-05-09 DIAGNOSIS — H353221 Exudative age-related macular degeneration, left eye, with active choroidal neovascularization: Secondary | ICD-10-CM

## 2021-05-09 MED ORDER — BEVACIZUMAB CHEMO INJECTION 1.25MG/0.05ML SYRINGE FOR KALEIDOSCOPE
1.2500 mg | INTRAVITREAL | Status: AC | PRN
  Administered 2021-05-09: 1.25 mg via INTRAVITREAL

## 2021-05-16 ENCOUNTER — Encounter (INDEPENDENT_AMBULATORY_CARE_PROVIDER_SITE_OTHER): Payer: Medicare Other | Admitting: Ophthalmology

## 2021-05-25 ENCOUNTER — Ambulatory Visit: Payer: Medicare Other | Admitting: Hematology and Oncology

## 2021-05-25 ENCOUNTER — Other Ambulatory Visit: Payer: Medicare Other

## 2021-05-29 ENCOUNTER — Other Ambulatory Visit: Payer: Self-pay

## 2021-05-29 ENCOUNTER — Encounter: Payer: Self-pay | Admitting: Hematology and Oncology

## 2021-05-29 ENCOUNTER — Inpatient Hospital Stay: Payer: Medicare Other | Attending: Hematology and Oncology

## 2021-05-29 ENCOUNTER — Inpatient Hospital Stay (HOSPITAL_BASED_OUTPATIENT_CLINIC_OR_DEPARTMENT_OTHER): Payer: Medicare Other | Admitting: Hematology and Oncology

## 2021-05-29 DIAGNOSIS — Z79899 Other long term (current) drug therapy: Secondary | ICD-10-CM | POA: Insufficient documentation

## 2021-05-29 DIAGNOSIS — Z85828 Personal history of other malignant neoplasm of skin: Secondary | ICD-10-CM | POA: Insufficient documentation

## 2021-05-29 DIAGNOSIS — C9110 Chronic lymphocytic leukemia of B-cell type not having achieved remission: Secondary | ICD-10-CM

## 2021-05-29 LAB — CBC WITH DIFFERENTIAL/PLATELET
Abs Immature Granulocytes: 0.04 10*3/uL (ref 0.00–0.07)
Basophils Absolute: 0.1 10*3/uL (ref 0.0–0.1)
Basophils Relative: 1 %
Eosinophils Absolute: 0.2 10*3/uL (ref 0.0–0.5)
Eosinophils Relative: 3 %
HCT: 39.6 % (ref 36.0–46.0)
Hemoglobin: 13.1 g/dL (ref 12.0–15.0)
Immature Granulocytes: 1 %
Lymphocytes Relative: 47 %
Lymphs Abs: 3.9 10*3/uL (ref 0.7–4.0)
MCH: 27.6 pg (ref 26.0–34.0)
MCHC: 33.1 g/dL (ref 30.0–36.0)
MCV: 83.5 fL (ref 80.0–100.0)
Monocytes Absolute: 0.6 10*3/uL (ref 0.1–1.0)
Monocytes Relative: 7 %
Neutro Abs: 3.5 10*3/uL (ref 1.7–7.7)
Neutrophils Relative %: 41 %
Platelets: 153 10*3/uL (ref 150–400)
RBC: 4.74 MIL/uL (ref 3.87–5.11)
RDW: 13.6 % (ref 11.5–15.5)
WBC: 8.3 10*3/uL (ref 4.0–10.5)
nRBC: 0 % (ref 0.0–0.2)

## 2021-05-29 NOTE — Assessment & Plan Note (Signed)
Clinically, she have no signs or symptoms to suggest cancer recurrence Her CBC is completely normal We discussed importance of annual influenza vaccination She is at risk of skin cancer and I recommend aggressive skin protection and close follow-up with dermatologist I will see her again in 1 year for further follow-up

## 2021-05-29 NOTE — Assessment & Plan Note (Signed)
She had skin surgery not long ago We discussed importance of aggressive skin protection and avoidance of excessive sun exposure She will continue to follow closely with her dermatologist

## 2021-05-29 NOTE — Progress Notes (Signed)
[facility] OFFICE PROGRESS NOTE  Patient Care Team: Cari Caraway, MD as PCP - General (Family Medicine) Irine Seal, MD as Attending Physician (Urology) Jacolyn Reedy, MD as Attending Physician (Cardiology) Heath Lark, MD as Consulting Physician (Hematology and Oncology)  ASSESSMENT & PLAN:  CLL (chronic lymphocytic leukemia) Clinically, she have no signs or symptoms to suggest cancer recurrence Her CBC is completely normal We discussed importance of annual influenza vaccination She is at risk of skin cancer and I recommend aggressive skin protection and close follow-up with dermatologist I will see her again in 1 year for further follow-up  History of skin cancer She had skin surgery not long ago We discussed importance of aggressive skin protection and avoidance of excessive sun exposure She will continue to follow closely with her dermatologist  No orders of the defined types were placed in this encounter.   All questions were answered. The patient knows to call the clinic with any problems, questions or concerns. The total time spent in the appointment was 20 minutes encounter with patients including review of chart and various tests results, discussions about plan of care and coordination of care plan   Heath Lark, MD 05/29/2021 12:27 PM  INTERVAL HISTORY: Please see below for problem oriented charting. She returns for CLL follow-up She is doing well No new lymphadenopathy Denies recent infection She had skin surgery performed on the top of her scalp not long ago but otherwise she is doing well  SUMMARY OF ONCOLOGIC HISTORY: Oncology History Overview Note  Del 13q    CLL (chronic lymphocytic leukemia) ([place])  02/09/2013 Pathology Results   Peripheral Blood Flow Cytometry - FINDINGS CONSISTENT WITH CHRONIC LYMPHOCYTIC LEUKEMIA    09/02/2013 Pathology Results   FISH positive for deletion 13 q    05/22/2017 Imaging   Worsening mesenteric and  retroperitoneal adenopathy concerning for worsening lymphoproliferative disorder/lymphoma.   Small cysts in the liver and kidneys are stable.   Prior cholecystectomy.   Aortic atherosclerosis.   Left colonic diverticulosis without active diverticulitis.    08/11/2017 Imaging   Chest Impression:  1. Mild mediastinal and hilar lymphadenopathy. 2. Clusters small axial lymph nodes.  Abdomen / Pelvis Impression:  1. Mild periaortic retroperitoneal adenopathy and moderate central mesenteric adenopathy not changed comparison CT. 2. Mild iliac adenopathy 3. Normal volume spleen. 4. No explanation for RIGHT lower quadrant pain. Cluster of lymph nodes in the ileocecal mesenteries similar to prior.    08/12/2017 Procedure   Placement of single lumen port a cath via right internal jugular vein. The catheter tip lies at the cavo-atrial junction. A power injectable port a cath was placed and is ready for immediate use.    08/14/2017 - 11/14/2017 Chemotherapy   She received Bendamustine and Rituxan    11/12/2017 Imaging   1. Continued decrease in mediastinal and hilar lymph nodes with interval resolution of the abdominal and pelvic lymphadenopathy seen on the prior study. There is no lymphadenopathy in the chest, abdomen, or pelvis by CT size criteria on today's study. No new or progressive interval findings. 2.  Aortic Atherosclerois (ICD10-170.0) 3. Hepatic and renal cysts, stable.    12/18/2017 Adverse Reaction   We are unable to resume cycle 5 of chemotherapy due to prolonged pancytopenia    01/29/2018 Procedure   Successful right IJ vein Port-A-Cath explant.      REVIEW OF SYSTEMS:   Constitutional: Denies fevers, chills or abnormal weight loss Eyes: Denies blurriness of vision Ears, nose, mouth, throat, and  face: Denies mucositis or sore throat Respiratory: Denies cough, dyspnea or wheezes Cardiovascular: Denies palpitation, chest discomfort or lower extremity  swelling Gastrointestinal:  Denies nausea, heartburn or change in bowel habits Skin: Denies abnormal skin rashes Lymphatics: Denies new lymphadenopathy or easy bruising Neurological:Denies numbness, tingling or new weaknesses Behavioral/Psych: Mood is stable, no new changes  All other systems were reviewed with the patient and are negative.  I have reviewed the past medical history, past surgical history, social history and family history with the patient and they are unchanged from previous note.  ALLERGIES:  is allergic to codeine, demerol [meperidine], morphine and related, ciprofloxacin, and sulfa antibiotics.  MEDICATIONS:  Current Outpatient Medications  Medication Sig Dispense Refill   acetaminophen (TYLENOL) 500 MG tablet Take 500 mg by mouth every 6 (six) hours as needed for pain.     amLODipine (NORVASC) 5 MG tablet Take 5 mg by mouth daily.     atorvastatin (LIPITOR) 40 MG tablet Take 40 mg by mouth daily.     Calcium Carb-Cholecalciferol (CALCIUM + D3) 600-200 MG-UNIT TABS Take 1 tablet by mouth 3 (three) times a week.      ELIQUIS 5 MG TABS tablet Take 5 mg by mouth 2 (two) times daily.     fluticasone (FLONASE) 50 MCG/ACT nasal spray Place 2 sprays into the nose daily.     folic acid (FOLVITE) 1 MG tablet Take 1 mg by mouth daily.     hydrochlorothiazide (HYDRODIURIL) 12.5 MG tablet Take 12.5 mg by mouth daily.     ibandronate (BONIVA) 150 MG tablet Take 150 mg by mouth every 30 (thirty) days.     metoprolol (LOPRESSOR) 50 MG tablet Take 50 mg by mouth 2 (two) times daily.     Multiple Vitamin (MULTIVITAMIN) tablet Take 1 tablet by mouth daily.     Multiple Vitamins-Minerals (EYE VITAMINS & MINERALS) TABS Take by mouth.     potassium chloride SA (K-DUR,KLOR-CON) 20 MEQ tablet Take 20 mEq by mouth 3 (three) times a week. Mon, Wed, Fri     No current facility-administered medications for this visit.    PHYSICAL EXAMINATION: ECOG PERFORMANCE STATUS: 0 -  Asymptomatic  Vitals:   05/29/21 0934  BP: 127/63  Pulse: (!) 52  Resp: 18  Temp: 98 F (36.7 C)  SpO2: 100%   Filed Weights   05/29/21 0934  Weight: 146 lb 3.2 oz (66.3 kg)    GENERAL:alert, no distress and comfortable SKIN: skin color, texture, turgor are normal, no rashes or significant lesions EYES: normal, Conjunctiva are pink and non-injected, sclera clear OROPHARYNX:no exudate, no erythema and lips, buccal mucosa, and tongue normal  NECK: supple, thyroid normal size, non-tender, without nodularity LYMPH:  no palpable lymphadenopathy in the cervical, axillary or inguinal LUNGS: clear to auscultation and percussion with normal breathing effort HEART: regular rate & rhythm and no murmurs and no lower extremity edema ABDOMEN:abdomen soft, non-tender and normal bowel sounds Musculoskeletal:no cyanosis of digits and no clubbing  NEURO: alert & oriented x 3 with fluent speech, no focal motor/sensory deficits  LABORATORY DATA:  I have reviewed the data as listed    Component Value Date/Time   NA 142 01/12/2019 0938   NA 141 11/13/2017 0753   K 4.1 01/12/2019 0938   K 4.1 11/13/2017 0753   CL 105 01/12/2019 0938   CL 104 02/08/2013 1454   CO2 28 01/12/2019 0938   CO2 27 11/13/2017 0753   GLUCOSE 113 (H) 01/12/2019 0938   GLUCOSE 113  11/13/2017 0753   GLUCOSE 103 (H) 02/08/2013 1454   BUN 14 01/12/2019 0938   BUN 11.6 11/13/2017 0753   CREATININE 0.98 01/12/2019 0938   CREATININE 0.8 11/13/2017 0753   CALCIUM 9.3 01/12/2019 0938   CALCIUM 9.3 11/13/2017 0753   PROT 6.7 01/12/2019 0938   PROT 6.2 (L) 11/13/2017 0753   ALBUMIN 4.0 01/12/2019 0938   ALBUMIN 3.3 (L) 11/13/2017 0753   AST 21 01/12/2019 0938   AST 24 11/13/2017 0753   ALT 25 01/12/2019 0938   ALT 24 11/13/2017 0753   ALKPHOS 67 01/12/2019 0938   ALKPHOS 83 11/13/2017 0753   BILITOT 0.7 01/12/2019 0938   BILITOT 0.38 11/13/2017 0753   GFRNONAA 57 (L) 01/12/2019 0938   GFRAA >60 01/12/2019 0938     No results found for: SPEP, UPEP  Lab Results  Component Value Date   WBC 8.3 05/29/2021   NEUTROABS 3.5 05/29/2021   HGB 13.1 05/29/2021   HCT 39.6 05/29/2021   MCV 83.5 05/29/2021   PLT 153 05/29/2021      Chemistry      Component Value Date/Time   NA 142 01/12/2019 0938   NA 141 11/13/2017 0753   K 4.1 01/12/2019 0938   K 4.1 11/13/2017 0753   CL 105 01/12/2019 0938   CL 104 02/08/2013 1454   CO2 28 01/12/2019 0938   CO2 27 11/13/2017 0753   BUN 14 01/12/2019 0938   BUN 11.6 11/13/2017 0753   CREATININE 0.98 01/12/2019 0938   CREATININE 0.8 11/13/2017 0753      Component Value Date/Time   CALCIUM 9.3 01/12/2019 0938   CALCIUM 9.3 11/13/2017 0753   ALKPHOS 67 01/12/2019 0938   ALKPHOS 83 11/13/2017 0753   AST 21 01/12/2019 0938   AST 24 11/13/2017 0753   ALT 25 01/12/2019 0938   ALT 24 11/13/2017 0753   BILITOT 0.7 01/12/2019 0938   BILITOT 0.38 11/13/2017 0753       RADIOGRAPHIC STUDIES: I have personally reviewed the radiological images as listed and agreed with the findings in the report. Intravitreal Injection, Pharmacologic Agent - OS - Left Eye  Result Date: 05/09/2021 Time Out 05/09/2021. 2:41 PM. Confirmed correct patient, procedure, site, and patient consented. Anesthesia Topical anesthesia was used. Anesthetic medications included Lidocaine 2%, Proparacaine 0.5%. Procedure Preparation included 5% betadine to ocular surface, eyelid speculum. A supplied needle was used. Injection: 1.25 mg Bevacizumab 1.25mg /0.21ml   Route: Intravitreal, Site: Left Eye   NDC: 24235-361-44, Lot: [id number] , Expiration date: 06/20/2021, Waste: 0.05 mL Post-op Post injection exam found visual acuity of at least counting fingers. The patient tolerated the procedure well. There were no complications. The patient received written and verbal post procedure care education. Post injection medications were not given.   OCT, Retina - OU - Both Eyes  Result Date: 05/09/2021 Right  Eye Quality was good. Central Foveal Thickness: 320. Progression has been stable. Findings include no IRF, no SRF, retinal drusen , pigment epithelial detachment, outer retinal atrophy, normal foveal contour. Left Eye Quality was good. Central Foveal Thickness: 273. Progression has improved. Findings include abnormal foveal contour, subretinal hyper-reflective material, no IRF, pigment epithelial detachment, choroidal neovascular membrane, outer retinal atrophy, retinal drusen , subretinal fluid (Mild Interval improvement in SRF and PED). Notes *Images captured and stored on drive Diagnosis / Impression: OD: non-exu ARMD -- prominent central PEDs OS: Mild Interval improvement in SRF and PED Clinical management: See below Abbreviations: NFP - Normal foveal profile. CME - cystoid macular  edema. PED - pigment epithelial detachment. IRF - intraretinal fluid. SRF - subretinal fluid. EZ - ellipsoid zone. ERM - epiretinal membrane. ORA - outer retinal atrophy. ORT - outer retinal tubulation. SRHM - subretinal hyper-reflective material. IRHM - intraretinal hyper-reflective material

## 2021-06-05 NOTE — Progress Notes (Signed)
[facility] Note  06/06/2021     CHIEF COMPLAINT Patient presents for Retina Follow Up   HISTORY OF PRESENT ILLNESS: Amanda [name] is a 75 y.o. female who presents to the clinic today for:   HPI     Retina Follow Up   Patient presents with  Wet AMD.  In left eye.  This started 5 days ago.  Duration of 4 weeks.  I, the attending physician,  performed the HPI with the patient and updated documentation appropriately.        Comments   Pt here for ret f/u exu ARMD OS. Pt is reporting wavy lines in OS that she noticed beginning last Friday (5 days ago). OD is stable, no changes. Pt reports multiple tiny floaters that have been there for months, those have not changed recently.       Last edited by Bernarda Caffey, MD on 06/07/2021 11:09 PM.     Pt states vision is stable  Referring physician: Shirleen Schirmer, Joana Reamer, MD [place],  [place] [address]  HISTORICAL INFORMATION:   Selected notes from the MEDICAL RECORD NUMBER Referred by Shirleen Schirmer, PA on 11.16.21 for eval of dry to wet AMD conversion OS.   CURRENT MEDICATIONS: No current outpatient medications on file. (Ophthalmic Drugs)   No current facility-administered medications for this visit. (Ophthalmic Drugs)   Current Outpatient Medications (Other)  Medication Sig   acetaminophen (TYLENOL) 500 MG tablet Take 500 mg by mouth every 6 (six) hours as needed for pain.   amLODipine (NORVASC) 5 MG tablet Take 5 mg by mouth daily.   atorvastatin (LIPITOR) 40 MG tablet Take 40 mg by mouth daily.   Calcium Carb-Cholecalciferol (CALCIUM + D3) 600-200 MG-UNIT TABS Take 1 tablet by mouth 3 (three) times a week.    ELIQUIS 5 MG TABS tablet Take 5 mg by mouth 2 (two) times daily.   fluticasone (FLONASE) 50 MCG/ACT nasal spray Place 2 sprays into the nose daily.   folic acid (FOLVITE) 1 MG tablet Take 1 mg by mouth daily.   hydrochlorothiazide (HYDRODIURIL) 12.5 MG  tablet Take 12.5 mg by mouth daily.   ibandronate (BONIVA) 150 MG tablet Take 150 mg by mouth every 30 (thirty) days.   metoprolol (LOPRESSOR) 50 MG tablet Take 50 mg by mouth 2 (two) times daily.   Multiple Vitamin (MULTIVITAMIN) tablet Take 1 tablet by mouth daily.   Multiple Vitamins-Minerals (EYE VITAMINS & MINERALS) TABS Take by mouth.   potassium chloride SA (K-DUR,KLOR-CON) 20 MEQ tablet Take 20 mEq by mouth 3 (three) times a week. Mon, Wed, Fri   No current facility-administered medications for this visit. (Other)      REVIEW OF SYSTEMS: ROS   Positive for: Skin, Cardiovascular, Eyes, Heme/Lymph Negative for: Constitutional, Gastrointestinal, Neurological, Genitourinary, Musculoskeletal, HENT, Endocrine, Respiratory, Psychiatric, Allergic/Imm Last edited by Kingsley Spittle, COT on 06/06/2021  2:24 PM.         ALLERGIES Allergies  Allergen Reactions   Codeine Nausea And Vomiting   Demerol [Meperidine] Nausea And Vomiting   Morphine And Related Nausea And Vomiting   Ciprofloxacin Rash   Sulfa Antibiotics Rash    PAST MEDICAL HISTORY Past Medical History:  Diagnosis Date   A-fib ([place])    Adenopathy    Atrial fibrillation ([place])    CLL (chronic lymphocytic leukemia) ([place]) 08/31/2013   HTN (hypertension)    Hyperlipidemia    Lymphocytosis    Macular degeneration  OU   Past Surgical History:  Procedure Laterality Date   ABDOMINAL HYSTERECTOMY     endometriosis, fibroid   APPENDECTOMY     BREAST BIOPSY     benign cyst.   CATARACT EXTRACTION Bilateral 2019   CHOLECYSTECTOMY     COLONOSCOPY  11/2012   Per Dr. Cristina Gong.  neg.    IR FLUORO GUIDE PORT INSERTION RIGHT  08/12/2017   IR REMOVAL TUN ACCESS W/ PORT W/O FL MOD SED  01/29/2018   IR US GUIDE VASC ACCESS RIGHT  08/12/2017   SQUAMOUS CELL CARCINOMA EXCISION  12/28/2020   Excised from top of head   THUMB ARTHROSCOPY     TOTAL HIP ARTHROPLASTY  2010    FAMILY HISTORY Family History  Problem Relation  Age of Onset   Renal Disease Mother    Cancer Mother        colon cancer   Cancer Father        colon cancer    SOCIAL HISTORY Social History   Tobacco Use   Smoking status: Never   Smokeless tobacco: Never  Substance Use Topics   Alcohol use: No   Drug use: No         OPHTHALMIC EXAM:  Base Eye Exam     Visual Acuity (Snellen - Linear)       Right Left   Dist cc 20/30 -2 20/25 -1   Dist ph cc 20/25 -3 NI    Correction: Glasses         Tonometry (Tonopen, 2:29 PM)       Right Left   Pressure 14 12         Pupils       Dark Light Shape React APD   Right 3 2 Round Brisk None   Left 3 2 Round Brisk None         Visual Fields (Counting fingers)       Left Right    Full Full         Extraocular Movement       Right Left    Full, Ortho Full, Ortho         Neuro/Psych     Oriented x3: Yes   Mood/Affect: Normal         Dilation     Both eyes: 1.0% Mydriacyl, 2.5% Phenylephrine @ 2:30 PM           Slit Lamp and Fundus Exam     Slit Lamp Exam       Right Left   Lids/Lashes Dermatochalasis - upper lid, mild Meibomian gland dysfunction Dermatochalasis - upper lid, mild Meibomian gland dysfunction   Conjunctiva/Sclera White and quiet White and quiet   Cornea Trace Debris in tear film, well healed temporal cataract wounds, 1+ pigmented [place] well healed temporal cataract wounds, 1+ Punctate epithelial erosions   Anterior Chamber Deep and quiet Deep and quiet   Iris Round and dilated Round and dilated   Lens PC IOL in good position PC IOL in good position   Vitreous Trace Vitreous syneresis, Posterior vitreous detachment Trace Vitreous syneresis, Posterior vitreous detachment         Fundus Exam       Right Left   Disc Compact, Pallor, Sharp rim, mild PPA Compact, Pink and Sharp, temporal PPP   C/D Ratio 0.1 0.2   Macula Flat, Blunted foveal reflex, Drusen, RPE mottling, clumping and atrophy, No heme or edema Blunted  foveal reflex, +CNV with +SRH (  mild), Drusen, RPE mottling and clumping; Persistent SRF overlying PED ? increased   Vessels attenuated, Tortuous attenuated, Tortuous   Periphery Attached; mild reticular degen Attached, reticular degeneration, No heme            Refraction     Wearing Rx       Sphere Cylinder Axis Add   Right -0.25 +0.75 165 +2.50   Left -0.50 +0.50 012 +2.50    Type: PAL            IMAGING AND PROCEDURES  Imaging and Procedures for 06/06/2021  OCT, Retina - OU - Both Eyes       Right Eye Quality was good. Central Foveal Thickness: 324. Progression has been stable. Findings include no IRF, no SRF, retinal drusen , pigment epithelial detachment, outer retinal atrophy, normal foveal contour.   Left Eye Quality was good. Central Foveal Thickness: 287. Progression has been stable. Findings include abnormal foveal contour, subretinal hyper-reflective material, no IRF, pigment epithelial detachment, choroidal neovascular membrane, outer retinal atrophy, retinal drusen , subretinal fluid (Persistent SRF and PED).   Notes *Images captured and stored on drive  Diagnosis / Impression:  OD: non-exu ARMD -- prominent central PEDs OS: Persistent SRF and PED  Clinical management:  See below  Abbreviations: NFP - Normal foveal profile. CME - cystoid macular edema. PED - pigment epithelial detachment. IRF - intraretinal fluid. SRF - subretinal fluid. EZ - ellipsoid zone. ERM - epiretinal membrane. ORA - outer retinal atrophy. ORT - outer retinal tubulation. SRHM - subretinal hyper-reflective material. IRHM - intraretinal hyper-reflective material      Intravitreal Injection, Pharmacologic Agent - OS - Left Eye       Time Out 06/06/2021. 3:40 PM. Confirmed correct patient, procedure, site, and patient consented.   Anesthesia Topical anesthesia was used. Anesthetic medications included Proparacaine 0.5%.   Procedure Preparation included eyelid speculum, 5%  betadine to ocular surface. A (32g) needle was used.   Injection: 2 mg aflibercept 2 MG/0.05ML   Route: Intravitreal, Site: Left Eye   NDC: [id number], Lot: [id number], Expiration date: 03/10/2022, Waste: 0.05 mL   Post-op Post injection exam found visual acuity of at least counting fingers. The patient tolerated the procedure well. There were no complications. The patient received written and verbal post procedure care education. Post injection medications were not given.            ASSESSMENT/PLAN:    ICD-10-CM   1. Exudative age-related macular degeneration of left eye with active choroidal neovascularization (HCC)  H35.3221 Intravitreal Injection, Pharmacologic Agent - OS - Left Eye    aflibercept (EYLEA) SOLN 2 mg    2. Retinal edema  H35.81 OCT, Retina - OU - Both Eyes    3. Intermediate stage nonexudative age-related macular degeneration of both eyes  H35.3132     4. Essential hypertension  I10     5. Hypertensive retinopathy of both eyes  H35.033     6. Pseudophakia, both eyes  Z96.1       1,2. Exudative age related macular degeneration, OS  - [facility] pt with known history of nonexudative ARMD OU  - acute "haze over vision" OS -- onset, Friday 11.12.21 -- initial exam with central subretinal heme  - s/p IVA OS #1 (11.17.21), #2 (12.15.21), #3 (01.12.22), #4 (02.09.22), #5 (03.16.22), #6 (04.20.22), #7 (06.01.22), #8 (06.29.22)--IVA resistance  - BCVA 20/25 from 20/20 OS  - exam and OCT shows persistent SRF and PED  - recommend IVE OS #  1 (07.27.22) - pt wishes to be treated with IVE OS - RBA of procedure discussed, questions answered - informed consent obtained and signed - see procedure note - pt with good insurance coverage for Eylea if switch in medication is indicated  - f/u in 4 wks -- DFE/OCT, possible injection  3. Age related macular degeneration, non-exudative OD  - The incidence, anatomy, and pathology of dry AMD, risk of progression, and the  AREDS and AREDS 2 study including smoking risks discussed with patient.  - recommend Amsler grid monitoring  4,5. Hypertensive retinopathy OU - discussed importance of tight BP control - monitor  6. Pseudophakia OU  - s/p CE/IOL OU  - IOLs in good position, doing well  - monitor  Ophthalmic Meds Ordered this visit:  Meds ordered this encounter  Medications   aflibercept (EYLEA) SOLN 2 mg        Return in about 4 weeks (around 07/04/2021) for 4 weeks exu ARMD OS, DFE/OCT.  There are no Patient Instructions on file for this visit.  Explained the diagnoses, plan, and follow up with the patient and they expressed understanding.  Patient expressed understanding of the importance of proper follow up care.  This document serves as a record of services personally performed by Gardiner Sleeper, MD, PhD. It was created on their behalf by Roselee Nova, COMT. The creation of this record is the provider's dictation and/or activities during the visit.  Electronically signed by: Roselee Nova, COMT 06/07/21 11:11 PM   Gardiner Sleeper, M.D., Ph.D. Diseases & Surgery of the Retina and Vitreous [facility]  I have reviewed the above documentation for accuracy and completeness, and I agree with the above. Gardiner Sleeper, M.D., Ph.D. 06/07/21 11:11 PM   Abbreviations: M myopia (nearsighted); A astigmatism; H hyperopia (farsighted); P presbyopia; Mrx spectacle prescription;  CTL contact lenses; OD right eye; OS left eye; OU both eyes  XT exotropia; ET esotropia; PEK punctate epithelial keratitis; PEE punctate epithelial erosions; DES dry eye syndrome; MGD meibomian gland dysfunction; ATs artificial tears; PFAT's preservative free artificial tears; [place] nuclear sclerotic cataract; PSC posterior subcapsular cataract; ERM epi-retinal membrane; PVD posterior vitreous detachment; RD retinal detachment; DM diabetes mellitus; DR diabetic retinopathy; NPDR non-proliferative diabetic  retinopathy; PDR proliferative diabetic retinopathy; CSME clinically significant macular edema; DME diabetic macular edema; dbh dot blot hemorrhages; CWS cotton wool spot; POAG primary open angle glaucoma; C/D cup-to-disc ratio; HVF humphrey visual field; GVF goldmann visual field; OCT optical coherence tomography; IOP intraocular pressure; BRVO Branch retinal vein occlusion; CRVO central retinal vein occlusion; CRAO central retinal artery occlusion; BRAO branch retinal artery occlusion; RT retinal tear; SB scleral buckle; PPV pars plana vitrectomy; VH Vitreous hemorrhage; PRP panretinal laser photocoagulation; IVK intravitreal kenalog; VMT vitreomacular traction; MH Macular hole;  NVD neovascularization of the disc; NVE neovascularization elsewhere; AREDS age related eye disease study; ARMD age related macular degeneration; POAG primary open angle glaucoma; EBMD epithelial/anterior basement membrane dystrophy; ACIOL anterior chamber intraocular lens; IOL intraocular lens; PCIOL posterior chamber intraocular lens; Phaco/IOL phacoemulsification with intraocular lens placement; [place] photorefractive keratectomy; LASIK laser assisted in situ keratomileusis; HTN hypertension; DM diabetes mellitus; COPD chronic obstructive pulmonary disease

## 2021-06-06 ENCOUNTER — Encounter (INDEPENDENT_AMBULATORY_CARE_PROVIDER_SITE_OTHER): Payer: Self-pay | Admitting: Ophthalmology

## 2021-06-06 ENCOUNTER — Ambulatory Visit (INDEPENDENT_AMBULATORY_CARE_PROVIDER_SITE_OTHER): Payer: Medicare Other | Admitting: Ophthalmology

## 2021-06-06 ENCOUNTER — Other Ambulatory Visit: Payer: Self-pay

## 2021-06-06 DIAGNOSIS — I10 Essential (primary) hypertension: Secondary | ICD-10-CM | POA: Diagnosis not present

## 2021-06-06 DIAGNOSIS — Z961 Presence of intraocular lens: Secondary | ICD-10-CM | POA: Diagnosis not present

## 2021-06-06 DIAGNOSIS — H35033 Hypertensive retinopathy, bilateral: Secondary | ICD-10-CM

## 2021-06-06 DIAGNOSIS — H353132 Nonexudative age-related macular degeneration, bilateral, intermediate dry stage: Secondary | ICD-10-CM

## 2021-06-06 DIAGNOSIS — H3581 Retinal edema: Secondary | ICD-10-CM

## 2021-06-06 DIAGNOSIS — H353221 Exudative age-related macular degeneration, left eye, with active choroidal neovascularization: Secondary | ICD-10-CM | POA: Diagnosis not present

## 2021-06-07 ENCOUNTER — Encounter (INDEPENDENT_AMBULATORY_CARE_PROVIDER_SITE_OTHER): Payer: Self-pay | Admitting: Ophthalmology

## 2021-06-07 MED ORDER — AFLIBERCEPT 2MG/0.05ML IZ SOLN FOR KALEIDOSCOPE
2.0000 mg | INTRAVITREAL | Status: AC | PRN
  Administered 2021-06-06: 2 mg via INTRAVITREAL

## 2021-06-13 DIAGNOSIS — M85852 Other specified disorders of bone density and structure, left thigh: Secondary | ICD-10-CM | POA: Diagnosis not present

## 2021-06-13 DIAGNOSIS — I10 Essential (primary) hypertension: Secondary | ICD-10-CM | POA: Diagnosis not present

## 2021-06-13 DIAGNOSIS — H3562 Retinal hemorrhage, left eye: Secondary | ICD-10-CM | POA: Diagnosis not present

## 2021-06-13 DIAGNOSIS — C9191 Lymphoid leukemia, unspecified, in remission: Secondary | ICD-10-CM | POA: Diagnosis not present

## 2021-06-13 DIAGNOSIS — J309 Allergic rhinitis, unspecified: Secondary | ICD-10-CM | POA: Diagnosis not present

## 2021-06-13 DIAGNOSIS — Z800 Family history of malignant neoplasm of digestive organs: Secondary | ICD-10-CM | POA: Diagnosis not present

## 2021-06-13 DIAGNOSIS — Z86010 Personal history of colonic polyps: Secondary | ICD-10-CM | POA: Diagnosis not present

## 2021-06-13 DIAGNOSIS — Z0000 Encounter for general adult medical examination without abnormal findings: Secondary | ICD-10-CM | POA: Diagnosis not present

## 2021-06-13 DIAGNOSIS — I480 Paroxysmal atrial fibrillation: Secondary | ICD-10-CM | POA: Diagnosis not present

## 2021-06-13 DIAGNOSIS — H3530 Unspecified macular degeneration: Secondary | ICD-10-CM | POA: Diagnosis not present

## 2021-06-13 DIAGNOSIS — E782 Mixed hyperlipidemia: Secondary | ICD-10-CM | POA: Diagnosis not present

## 2021-06-13 DIAGNOSIS — Z79899 Other long term (current) drug therapy: Secondary | ICD-10-CM | POA: Diagnosis not present

## 2021-06-18 DIAGNOSIS — M85852 Other specified disorders of bone density and structure, left thigh: Secondary | ICD-10-CM | POA: Diagnosis not present

## 2021-06-18 DIAGNOSIS — E782 Mixed hyperlipidemia: Secondary | ICD-10-CM | POA: Diagnosis not present

## 2021-06-18 DIAGNOSIS — C9191 Lymphoid leukemia, unspecified, in remission: Secondary | ICD-10-CM | POA: Diagnosis not present

## 2021-06-18 DIAGNOSIS — I480 Paroxysmal atrial fibrillation: Secondary | ICD-10-CM | POA: Diagnosis not present

## 2021-06-18 DIAGNOSIS — I10 Essential (primary) hypertension: Secondary | ICD-10-CM | POA: Diagnosis not present

## 2021-06-22 DIAGNOSIS — E782 Mixed hyperlipidemia: Secondary | ICD-10-CM | POA: Diagnosis not present

## 2021-06-22 DIAGNOSIS — I480 Paroxysmal atrial fibrillation: Secondary | ICD-10-CM | POA: Diagnosis not present

## 2021-06-22 DIAGNOSIS — I10 Essential (primary) hypertension: Secondary | ICD-10-CM | POA: Diagnosis not present

## 2021-07-03 NOTE — Progress Notes (Signed)
[facility] Note  07/04/2021     CHIEF COMPLAINT Patient presents for Retina Follow Up   HISTORY OF PRESENT ILLNESS: Amanda [name] is a 75 y.o. female who presents to the clinic today for:   HPI     Retina Follow Up   Patient presents with  Wet AMD.  In left eye.  This started 4 weeks ago.  I, the attending physician,  performed the HPI with the patient and updated documentation appropriately.        Comments   Patient here for 4 weeks retina follow up for exu ARMD OS. Patient states vision doing fine. Still the curve in OS. Nothing new. No eye pain.       Last edited by Bernarda Caffey, MD on 07/09/2021 12:16 AM.     Patient states still sees curve in vision OS.   Referring physician: Shirleen Schirmer, Joana Reamer, MD [place],  [place] [address]  HISTORICAL INFORMATION:   Selected notes from the MEDICAL RECORD NUMBER Referred by Shirleen Schirmer, PA on 11.16.21 for eval of dry to wet AMD conversion OS.   CURRENT MEDICATIONS: No current outpatient medications on file. (Ophthalmic Drugs)   No current facility-administered medications for this visit. (Ophthalmic Drugs)   Current Outpatient Medications (Other)  Medication Sig   acetaminophen (TYLENOL) 500 MG tablet Take 500 mg by mouth every 6 (six) hours as needed for pain.   amLODipine (NORVASC) 5 MG tablet Take 5 mg by mouth daily.   atorvastatin (LIPITOR) 40 MG tablet Take 40 mg by mouth daily.   Calcium Carb-Cholecalciferol (CALCIUM + D3) 600-200 MG-UNIT TABS Take 1 tablet by mouth 3 (three) times a week.    ELIQUIS 5 MG TABS tablet Take 5 mg by mouth 2 (two) times daily.   fluticasone (FLONASE) 50 MCG/ACT nasal spray Place 2 sprays into the nose daily.   folic acid (FOLVITE) 1 MG tablet Take 1 mg by mouth daily.   hydrochlorothiazide (HYDRODIURIL) 12.5 MG tablet Take 12.5 mg by mouth daily.   ibandronate (BONIVA) 150 MG tablet Take 150 mg by mouth every 30  (thirty) days.   metoprolol (LOPRESSOR) 50 MG tablet Take 50 mg by mouth 2 (two) times daily.   Multiple Vitamin (MULTIVITAMIN) tablet Take 1 tablet by mouth daily.   Multiple Vitamins-Minerals (EYE VITAMINS & MINERALS) TABS Take by mouth.   potassium chloride SA (K-DUR,KLOR-CON) 20 MEQ tablet Take 20 mEq by mouth 3 (three) times a week. Mon, Wed, Fri   No current facility-administered medications for this visit. (Other)      REVIEW OF SYSTEMS: ROS   Positive for: Skin, Cardiovascular, Eyes, Heme/Lymph Negative for: Constitutional, Gastrointestinal, Neurological, Genitourinary, Musculoskeletal, HENT, Endocrine, Respiratory, Psychiatric, Allergic/Imm Last edited by Theodore Demark, COA on 07/04/2021  2:16 PM.          ALLERGIES Allergies  Allergen Reactions   Codeine Nausea And Vomiting   Demerol [Meperidine] Nausea And Vomiting   Morphine And Related Nausea And Vomiting   Ciprofloxacin Rash   Sulfa Antibiotics Rash    PAST MEDICAL HISTORY Past Medical History:  Diagnosis Date   A-fib [facility])    Adenopathy    Atrial fibrillation ([place])    CLL (chronic lymphocytic leukemia) ([place]) 08/31/2013   HTN (hypertension)    Hyperlipidemia    Lymphocytosis    Macular degeneration    OU   Past Surgical History:  Procedure Laterality Date   ABDOMINAL HYSTERECTOMY  endometriosis, fibroid   APPENDECTOMY     BREAST BIOPSY     benign cyst.   CATARACT EXTRACTION Bilateral 2019   CHOLECYSTECTOMY     COLONOSCOPY  11/2012   Per Dr. Cristina Gong.  neg.    IR FLUORO GUIDE PORT INSERTION RIGHT  08/12/2017   IR REMOVAL TUN ACCESS W/ PORT W/O FL MOD SED  01/29/2018   IR US GUIDE VASC ACCESS RIGHT  08/12/2017   SQUAMOUS CELL CARCINOMA EXCISION  12/28/2020   Excised from top of head   THUMB ARTHROSCOPY     TOTAL HIP ARTHROPLASTY  2010    FAMILY HISTORY Family History  Problem Relation Age of Onset   Renal Disease Mother    Cancer Mother        colon cancer   Cancer Father         colon cancer    SOCIAL HISTORY Social History   Tobacco Use   Smoking status: Never   Smokeless tobacco: Never  Substance Use Topics   Alcohol use: No   Drug use: No         OPHTHALMIC EXAM:  Base Eye Exam     Visual Acuity (Snellen - Linear)       Right Left   Dist cc 20/25 +1 20/25 -2   Dist ph cc NI NI    Correction: Glasses         Tonometry (Tonopen, 2:14 PM)       Right Left   Pressure 14 13         Pupils       Dark Light Shape React APD   Right 3 2 Round Brisk None   Left 3 2 Round Brisk None         Visual Fields (Counting fingers)       Left Right    Full Full         Extraocular Movement       Right Left    Full, Ortho Full, Ortho         Neuro/Psych     Oriented x3: Yes   Mood/Affect: Normal         Dilation     Both eyes: 1.0% Mydriacyl, 2.5% Phenylephrine @ 2:14 PM           Slit Lamp and Fundus Exam     Slit Lamp Exam       Right Left   Lids/Lashes Dermatochalasis - upper lid, mild Meibomian gland dysfunction Dermatochalasis - upper lid, mild Meibomian gland dysfunction   Conjunctiva/Sclera White and quiet White and quiet   Cornea Trace Debris in tear film, well healed temporal cataract wounds, 1+ pigmented Guttata well healed temporal cataract wounds, 1+ Punctate epithelial erosions   Anterior Chamber Deep and quiet Deep and quiet   Iris Round and dilated Round and dilated   Lens PC IOL in good position PC IOL in good position   Vitreous Trace Vitreous syneresis, Posterior vitreous detachment Trace Vitreous syneresis, Posterior vitreous detachment         Fundus Exam       Right Left   Disc Compact, Pallor, Sharp rim, mild PPA Compact, Pink and Sharp, temporal PPP   C/D Ratio 0.1 0.2   Macula Flat, Blunted foveal reflex, Drusen, RPE mottling, clumping and atrophy, +PED's No heme or edema Blunted foveal reflex, +CNV, Drusen, RPE mottling and clumping; Persistent SRF overlying PED--improved   Vessels  attenuated, Tortuous attenuated, Tortuous   Periphery Attached;  mild reticular degeneration, no heme Attached, reticular degeneration, No heme            Refraction     Wearing Rx       Sphere Cylinder Axis Add   Right -0.25 +0.75 165 +2.50   Left -0.50 +0.50 012 +2.50    Type: PAL            IMAGING AND PROCEDURES  Imaging and Procedures for 07/04/2021  OCT, Retina - OU - Both Eyes       Right Eye Quality was good. Central Foveal Thickness: 322. Progression has been stable. Findings include no IRF, no SRF, retinal drusen , pigment epithelial detachment, outer retinal atrophy, normal foveal contour.   Left Eye Quality was good. Central Foveal Thickness: 285. Progression has been stable. Findings include abnormal foveal contour, subretinal hyper-reflective material, no IRF, pigment epithelial detachment, choroidal neovascular membrane, outer retinal atrophy, retinal drusen , subretinal fluid (Persistent SRF and PED).   Notes *Images captured and stored on drive  Diagnosis / Impression:  OD: non-exu ARMD -- prominent central PEDs OS: Interval improvement in SRF overlying persistent, central PED  Clinical management:  See below  Abbreviations: NFP - Normal foveal profile. CME - cystoid macular edema. PED - pigment epithelial detachment. IRF - intraretinal fluid. SRF - subretinal fluid. EZ - ellipsoid zone. ERM - epiretinal membrane. ORA - outer retinal atrophy. ORT - outer retinal tubulation. SRHM - subretinal hyper-reflective material. IRHM - intraretinal hyper-reflective material      Intravitreal Injection, Pharmacologic Agent - OS - Left Eye       Time Out 07/04/2021. 3:18 PM. Confirmed correct patient, procedure, site, and patient consented.   Anesthesia Topical anesthesia was used. Anesthetic medications included Lidocaine 2%, Proparacaine 0.5%.   Procedure Preparation included 5% betadine to ocular surface, eyelid speculum.   Injection: 2 mg  aflibercept 2 MG/0.05ML   Route: Intravitreal, Site: Left Eye   NDC: [id number], Lot: [id number], Expiration date: 04/09/2022, Waste: 0.05 mL   Post-op Post injection exam found visual acuity of at least counting fingers. The patient tolerated the procedure well. There were no complications. The patient received written and verbal post procedure care education. Post injection medications were not given.             ASSESSMENT/PLAN:    ICD-10-CM   1. Exudative age-related macular degeneration of left eye with active choroidal neovascularization (HCC)  H35.3221 Intravitreal Injection, Pharmacologic Agent - OS - Left Eye    aflibercept (EYLEA) SOLN 2 mg    2. Retinal edema  H35.81 OCT, Retina - OU - Both Eyes    3. Intermediate stage nonexudative age-related macular degeneration of both eyes  H35.3132     4. Essential hypertension  I10     5. Hypertensive retinopathy of both eyes  H35.033     6. Pseudophakia, both eyes  Z96.1      1,2. Exudative age related macular degeneration, OS  - [facility] pt with known history of nonexudative ARMD OU  - acute "haze over vision" OS -- onset, Friday 11.12.21 -- initial exam with central subretinal heme  - s/p IVA OS #1 (11.17.21), #2 (12.15.21), #3 (01.12.22), #4 (02.09.22), #5 (03.16.22), #6 (04.20.22), #7 (06.01.22), #8 (06.29.22)--IVA resistance  - s/p IVE OS #1 (07.27.22)  - BCVA stable at 20/25  - exam and OCT shows interval improvement in SRF overlying persistent, central PED  - recommend IVE OS #2 (08.24.22) - pt wishes to be treated with IVE  OS - RBA of procedure discussed, questions answered - informed consent obtained and signed - see procedure note  - f/u in 4 wks -- DFE/OCT, possible injection  3. Age related macular degeneration, non-exudative OD  - The incidence, anatomy, and pathology of dry AMD, risk of progression, and the AREDS and AREDS 2 study including smoking risks discussed with patient.  - recommend Amsler  grid monitoring  4,5. Hypertensive retinopathy OU - discussed importance of tight BP control - monitor  6. Pseudophakia OU  - s/p CE/IOL OU  - IOLs in good position, doing well  - monitor  Ophthalmic Meds Ordered this visit:  Meds ordered this encounter  Medications   aflibercept (EYLEA) SOLN 2 mg       Return in about 4 weeks (around 08/01/2021) for DFE, OCT.  There are no Patient Instructions on file for this visit.  This document serves as a record of services personally performed by Gardiner Sleeper, MD, PhD. It was created on their behalf by Orvan Falconer, an ophthalmic technician. The creation of this record is the provider's dictation and/or activities during the visit.    Electronically signed by: Orvan Falconer, OA, 07/09/21  12:31 AM   This document serves as a record of services personally performed by Gardiner Sleeper, MD, PhD. It was created on their behalf by Roselee Nova, COMT. The creation of this record is the provider's dictation and/or activities during the visit.  Electronically signed by: Roselee Nova, COMT 07/09/21 12:31 AM  Gardiner Sleeper, M.D., Ph.D. Diseases & Surgery of the Retina and [place] 07/04/2021   I have reviewed the above documentation for accuracy and completeness, and I agree with the above. Gardiner Sleeper, M.D., Ph.D. 07/09/21 12:33 AM   Abbreviations: M myopia (nearsighted); A astigmatism; H hyperopia (farsighted); P presbyopia; Mrx spectacle prescription;  CTL contact lenses; OD right eye; OS left eye; OU both eyes  XT exotropia; ET esotropia; PEK punctate epithelial keratitis; PEE punctate epithelial erosions; DES dry eye syndrome; MGD meibomian gland dysfunction; ATs artificial tears; PFAT's preservative free artificial tears; Murphy nuclear sclerotic cataract; PSC posterior subcapsular cataract; ERM epi-retinal membrane; PVD posterior vitreous detachment; RD retinal detachment; DM diabetes  mellitus; DR diabetic retinopathy; NPDR non-proliferative diabetic retinopathy; PDR proliferative diabetic retinopathy; CSME clinically significant macular edema; DME diabetic macular edema; dbh dot blot hemorrhages; CWS cotton wool spot; POAG primary open angle glaucoma; C/D cup-to-disc ratio; HVF humphrey visual field; GVF goldmann visual field; OCT optical coherence tomography; IOP intraocular pressure; BRVO Branch retinal vein occlusion; CRVO central retinal vein occlusion; CRAO central retinal artery occlusion; BRAO branch retinal artery occlusion; RT retinal tear; SB scleral buckle; PPV pars plana vitrectomy; VH Vitreous hemorrhage; PRP panretinal laser photocoagulation; IVK intravitreal kenalog; VMT vitreomacular traction; MH Macular hole;  NVD neovascularization of the disc; NVE neovascularization elsewhere; AREDS age related eye disease study; ARMD age related macular degeneration; POAG primary open angle glaucoma; EBMD epithelial/anterior basement membrane dystrophy; ACIOL anterior chamber intraocular lens; IOL intraocular lens; PCIOL posterior chamber intraocular lens; Phaco/IOL phacoemulsification with intraocular lens placement; [place] photorefractive keratectomy; LASIK laser assisted in situ keratomileusis; HTN hypertension; DM diabetes mellitus; COPD chronic obstructive pulmonary disease

## 2021-07-04 ENCOUNTER — Encounter (INDEPENDENT_AMBULATORY_CARE_PROVIDER_SITE_OTHER): Payer: Self-pay | Admitting: Ophthalmology

## 2021-07-04 ENCOUNTER — Other Ambulatory Visit: Payer: Self-pay

## 2021-07-04 ENCOUNTER — Ambulatory Visit (INDEPENDENT_AMBULATORY_CARE_PROVIDER_SITE_OTHER): Payer: Medicare Other | Admitting: Ophthalmology

## 2021-07-04 DIAGNOSIS — I10 Essential (primary) hypertension: Secondary | ICD-10-CM | POA: Diagnosis not present

## 2021-07-04 DIAGNOSIS — Z961 Presence of intraocular lens: Secondary | ICD-10-CM

## 2021-07-04 DIAGNOSIS — H353132 Nonexudative age-related macular degeneration, bilateral, intermediate dry stage: Secondary | ICD-10-CM | POA: Diagnosis not present

## 2021-07-04 DIAGNOSIS — H353221 Exudative age-related macular degeneration, left eye, with active choroidal neovascularization: Secondary | ICD-10-CM | POA: Diagnosis not present

## 2021-07-04 DIAGNOSIS — H35033 Hypertensive retinopathy, bilateral: Secondary | ICD-10-CM | POA: Diagnosis not present

## 2021-07-04 DIAGNOSIS — H3581 Retinal edema: Secondary | ICD-10-CM

## 2021-07-09 ENCOUNTER — Encounter (INDEPENDENT_AMBULATORY_CARE_PROVIDER_SITE_OTHER): Payer: Self-pay | Admitting: Ophthalmology

## 2021-07-09 MED ORDER — AFLIBERCEPT 2MG/0.05ML IZ SOLN FOR KALEIDOSCOPE
2.0000 mg | INTRAVITREAL | Status: AC | PRN
  Administered 2021-07-04: 2 mg via INTRAVITREAL

## 2021-07-31 NOTE — Progress Notes (Signed)
[facility] Note  08/01/2021     CHIEF COMPLAINT Patient presents for Retina Follow Up   HISTORY OF PRESENT ILLNESS: Amanda [name] is a 75 y.o. female who presents to the clinic today for:   HPI     Retina Follow Up   Patient presents with  Wet AMD.  In left eye.  This started 4 weeks ago.  I, the attending physician,  performed the HPI with the patient and updated documentation appropriately.        Comments   Patient here for 4 weeks retina follow up for exu ARMD OS. Patient states vision about the same. Still has lots of floaters in OS. No big chunks of anything. No eye pain. Got covid booster today.      Last edited by Bernarda Caffey, MD on 08/05/2021 12:55 AM.    Patient states   Referring physician: Shirleen Schirmer, PA-C [facility] [address] [place],  [place] [address]  HISTORICAL INFORMATION:   Selected notes from the MEDICAL RECORD NUMBER Referred by Shirleen Schirmer, PA on 11.16.21 for eval of dry to wet AMD conversion OS.   CURRENT MEDICATIONS: No current outpatient medications on file. (Ophthalmic Drugs)   No current facility-administered medications for this visit. (Ophthalmic Drugs)   Current Outpatient Medications (Other)  Medication Sig   acetaminophen (TYLENOL) 500 MG tablet Take 500 mg by mouth every 6 (six) hours as needed for pain.   amLODipine (NORVASC) 5 MG tablet Take 5 mg by mouth daily.   atorvastatin (LIPITOR) 40 MG tablet Take 40 mg by mouth daily.   Calcium Carb-Cholecalciferol (CALCIUM + D3) 600-200 MG-UNIT TABS Take 1 tablet by mouth 3 (three) times a week.    ELIQUIS 5 MG TABS tablet Take 5 mg by mouth 2 (two) times daily.   fluticasone (FLONASE) 50 MCG/ACT nasal spray Place 2 sprays into the nose daily.   folic acid (FOLVITE) 1 MG tablet Take 1 mg by mouth daily.   hydrochlorothiazide (HYDRODIURIL) 12.5 MG tablet Take 12.5 mg by mouth daily.   ibandronate (BONIVA) 150 MG  tablet Take 150 mg by mouth every 30 (thirty) days.   metoprolol (LOPRESSOR) 50 MG tablet Take 50 mg by mouth 2 (two) times daily.   Multiple Vitamin (MULTIVITAMIN) tablet Take 1 tablet by mouth daily.   Multiple Vitamins-Minerals (EYE VITAMINS & MINERALS) TABS Take by mouth.   potassium chloride SA (K-DUR,KLOR-CON) 20 MEQ tablet Take 20 mEq by mouth 3 (three) times a week. Mon, Wed, Fri   No current facility-administered medications for this visit. (Other)    REVIEW OF SYSTEMS: ROS   Positive for: Skin, Cardiovascular, Eyes, Heme/Lymph Negative for: Constitutional, Gastrointestinal, Neurological, Genitourinary, Musculoskeletal, HENT, Endocrine, Respiratory, Psychiatric, Allergic/Imm Last edited by Theodore Demark, COA on 08/01/2021  2:28 PM.      ALLERGIES Allergies  Allergen Reactions   Codeine Nausea And Vomiting   Demerol [Meperidine] Nausea And Vomiting   Morphine And Related Nausea And Vomiting   Ciprofloxacin Rash   Sulfa Antibiotics Rash    PAST MEDICAL HISTORY Past Medical History:  Diagnosis Date   A-fib [facility])    Adenopathy    Atrial fibrillation ([place])    CLL (chronic lymphocytic leukemia) ([place]) 08/31/2013   HTN (hypertension)    Hyperlipidemia    Lymphocytosis    Macular degeneration    OU   Past Surgical History:  Procedure Laterality Date   ABDOMINAL HYSTERECTOMY  endometriosis, fibroid   APPENDECTOMY     BREAST BIOPSY     benign cyst.   CATARACT EXTRACTION Bilateral 2019   CHOLECYSTECTOMY     COLONOSCOPY  11/2012   Per Dr. Cristina Gong.  neg.    IR FLUORO GUIDE PORT INSERTION RIGHT  08/12/2017   IR REMOVAL TUN ACCESS W/ PORT W/O FL MOD SED  01/29/2018   IR US GUIDE VASC ACCESS RIGHT  08/12/2017   SQUAMOUS CELL CARCINOMA EXCISION  12/28/2020   Excised from top of head   THUMB ARTHROSCOPY     TOTAL HIP ARTHROPLASTY  2010    FAMILY HISTORY Family History  Problem Relation Age of Onset   Renal Disease Mother    Cancer Mother        colon cancer    Cancer Father        colon cancer    SOCIAL HISTORY Social History   Tobacco Use   Smoking status: Never   Smokeless tobacco: Never  Substance Use Topics   Alcohol use: No   Drug use: No         OPHTHALMIC EXAM:  Base Eye Exam     Visual Acuity (Snellen - Linear)       Right Left   Dist cc 20/30 -2 20/25 +1   Dist ph cc NI 20/20 -1    Correction: Glasses         Tonometry (Tonopen, 2:25 PM)       Right Left   Pressure 11 10         Pupils       Dark Light Shape React APD   Right 3 2 Round Brisk None   Left 3 2 Round Brisk None         Visual Fields (Counting fingers)       Left Right    Full          Extraocular Movement       Right Left    Full, Ortho Full, Ortho         Neuro/Psych     Oriented x3: Yes   Mood/Affect: Normal         Dilation     Both eyes: 1.0% Mydriacyl, 2.5% Phenylephrine @ 2:25 PM           Slit Lamp and Fundus Exam     Slit Lamp Exam       Right Left   Lids/Lashes Dermatochalasis - upper lid, mild Meibomian gland dysfunction Dermatochalasis - upper lid, mild Meibomian gland dysfunction   Conjunctiva/Sclera White and quiet White and quiet   Cornea Trace Debris in tear film, well healed temporal cataract wounds, 1+ pigmented Guttata well healed temporal cataract wounds, 1+ Punctate epithelial erosions   Anterior Chamber Deep and quiet Deep and quiet   Iris Round and dilated Round and dilated   Lens PC IOL in good position PC IOL in good position   Vitreous Trace Vitreous syneresis, Posterior vitreous detachment Trace Vitreous syneresis, Posterior vitreous detachment         Fundus Exam       Right Left   Disc Compact, Pallor, Sharp rim, mild PPA Compact, Pink and Sharp, temporal PPP   C/D Ratio 0.1 0.2   Macula Flat, Blunted foveal reflex, Drusen, RPE mottling, clumping and early atrophy, +PED's, No heme or edema Blunted foveal reflex, +CNV, Drusen, RPE mottling and clumping; Persistent shallow  SRF overlying PED   Vessels attenuated, Tortuous attenuated, Tortuous  Periphery Attached; mild reticular degeneration, no heme Attached, reticular degeneration, No heme            Refraction     Wearing Rx       Sphere Cylinder Axis Add   Right -0.25 +0.75 165 +2.50   Left -0.50 +0.50 012 +2.50    Type: PAL            IMAGING AND PROCEDURES  Imaging and Procedures for 08/01/2021  OCT, Retina - OU - Both Eyes       Right Eye Quality was good. Central Foveal Thickness: 326. Progression has been stable. Findings include no IRF, no SRF, retinal drusen , pigment epithelial detachment, outer retinal atrophy, normal foveal contour.   Left Eye Quality was good. Central Foveal Thickness: 285. Progression has worsened. Findings include abnormal foveal contour, subretinal hyper-reflective material, no IRF, pigment epithelial detachment, choroidal neovascular membrane, outer retinal atrophy, retinal drusen , subretinal fluid (Mild interval increase in SRF overlying stable PED).   Notes *Images captured and stored on drive  Diagnosis / Impression:  OD: non-exu ARMD -- prominent central PEDs OS: exu ARMD -- Mild interval increase in SRF overlying stable PED  Clinical management:  See below  Abbreviations: NFP - Normal foveal profile. CME - cystoid macular edema. PED - pigment epithelial detachment. IRF - intraretinal fluid. SRF - subretinal fluid. EZ - ellipsoid zone. ERM - epiretinal membrane. ORA - outer retinal atrophy. ORT - outer retinal tubulation. SRHM - subretinal hyper-reflective material. IRHM - intraretinal hyper-reflective material      Intravitreal Injection, Pharmacologic Agent - OS - Left Eye       Time Out 08/01/2021. 3:02 PM. Confirmed correct patient, procedure, site, and patient consented.   Anesthesia Topical anesthesia was used. Anesthetic medications included Lidocaine 2%, Proparacaine 0.5%.   Procedure Preparation included 5% betadine to ocular  surface, eyelid speculum. A (32g) needle was used.   Injection: 2 mg aflibercept 2 MG/0.05ML   Route: Intravitreal, Site: Left Eye   NDC: [id number], Lot: [id number], Expiration date: 05/10/2022, Waste: 0.05 mL   Post-op Post injection exam found visual acuity of at least counting fingers. The patient tolerated the procedure well. There were no complications. The patient received written and verbal post procedure care education. Post injection medications were not given.            ASSESSMENT/PLAN:    ICD-10-CM   1. Exudative age-related macular degeneration of left eye with active choroidal neovascularization (HCC)  H35.3221 Intravitreal Injection, Pharmacologic Agent - OS - Left Eye    aflibercept (EYLEA) SOLN 2 mg    2. Retinal edema  H35.81 OCT, Retina - OU - Both Eyes    3. Intermediate stage nonexudative age-related macular degeneration of both eyes  H35.3132     4. Essential hypertension  I10     5. Hypertensive retinopathy of both eyes  H35.033     6. Pseudophakia, both eyes  Z96.1      1,2. Exudative age related macular degeneration, OS  - [facility] pt with known history of nonexudative ARMD OU  - acute "haze over vision" OS -- onset, Friday 11.12.21 -- initial exam with central subretinal heme  - s/p IVA OS #1 (11.17.21), #2 (12.15.21), #3 (01.12.22), #4 (02.09.22), #5 (03.16.22), #6 (04.20.22), #7 (06.01.22), #8 (06.29.22)--IVA resistance  - s/p IVE OS #1 (07.27.22), #2 (08.24.22)  - BCVA 20/20  - exam and OCT shows Mild interval increase in SRF overlying stable PED at 4 wks  -  recommend IVE OS #3 (09.21.22) - pt wishes to be treated with IVE OS - RBA of procedure discussed, questions answered - informed consent obtained and signed - see procedure note  - f/u in 4 wks -- DFE/OCT, possible injection  3. Age related macular degeneration, non-exudative OD  - The incidence, anatomy, and pathology of dry AMD, risk of progression, and the AREDS and AREDS 2  study including smoking risks discussed with patient.  - recommend Amsler grid monitoring  4,5. Hypertensive retinopathy OU - discussed importance of tight BP control - monitor  6. Pseudophakia OU  - s/p CE/IOL OU  - IOLs in good position, doing well  - monitor  Ophthalmic Meds Ordered this visit:  Meds ordered this encounter  Medications   aflibercept (EYLEA) SOLN 2 mg        Return in about 4 weeks (around 08/29/2021) for f/u exu ARMD OS, DFE, OCT.  There are no Patient Instructions on file for this visit.   This document serves as a record of services personally performed by Gardiner Sleeper, MD, PhD. It was created on their behalf by Roselee Nova, COMT. The creation of this record is the provider's dictation and/or activities during the visit.  Electronically signed by: Roselee Nova, COMT 08/05/21 12:59 AM  This document serves as a record of services personally performed by Gardiner Sleeper, MD, PhD. It was created on their behalf by San Jetty. Owens Shark, OA an ophthalmic technician. The creation of this record is the provider's dictation and/or activities during the visit.    Electronically signed by: San Jetty. Owens Shark, [place] 09.21.2022 12:59 AM   Gardiner Sleeper, M.D., Ph.D. Diseases & Surgery of the Retina and Vitreous [facility]  I have reviewed the above documentation for accuracy and completeness, and I agree with the above. Gardiner Sleeper, M.D., Ph.D. 08/05/21 12:59 AM   Abbreviations: M myopia (nearsighted); A astigmatism; H hyperopia (farsighted); P presbyopia; Mrx spectacle prescription;  CTL contact lenses; OD right eye; OS left eye; OU both eyes  XT exotropia; ET esotropia; PEK punctate epithelial keratitis; PEE punctate epithelial erosions; DES dry eye syndrome; MGD meibomian gland dysfunction; ATs artificial tears; PFAT's preservative free artificial tears; Russell nuclear sclerotic cataract; PSC posterior subcapsular cataract; ERM epi-retinal  membrane; PVD posterior vitreous detachment; RD retinal detachment; DM diabetes mellitus; DR diabetic retinopathy; NPDR non-proliferative diabetic retinopathy; PDR proliferative diabetic retinopathy; CSME clinically significant macular edema; DME diabetic macular edema; dbh dot blot hemorrhages; CWS cotton wool spot; POAG primary open angle glaucoma; C/D cup-to-disc ratio; HVF humphrey visual field; GVF goldmann visual field; OCT optical coherence tomography; IOP intraocular pressure; BRVO Branch retinal vein occlusion; CRVO central retinal vein occlusion; CRAO central retinal artery occlusion; BRAO branch retinal artery occlusion; RT retinal tear; SB scleral buckle; PPV pars plana vitrectomy; VH Vitreous hemorrhage; PRP panretinal laser photocoagulation; IVK intravitreal kenalog; VMT vitreomacular traction; MH Macular hole;  NVD neovascularization of the disc; NVE neovascularization elsewhere; AREDS age related eye disease study; ARMD age related macular degeneration; POAG primary open angle glaucoma; EBMD epithelial/anterior basement membrane dystrophy; ACIOL anterior chamber intraocular lens; IOL intraocular lens; PCIOL posterior chamber intraocular lens; Phaco/IOL phacoemulsification with intraocular lens placement; [place] photorefractive keratectomy; LASIK laser assisted in situ keratomileusis; HTN hypertension; DM diabetes mellitus; COPD chronic obstructive pulmonary disease

## 2021-08-01 ENCOUNTER — Other Ambulatory Visit: Payer: Self-pay

## 2021-08-01 ENCOUNTER — Ambulatory Visit (INDEPENDENT_AMBULATORY_CARE_PROVIDER_SITE_OTHER): Payer: Medicare Other | Admitting: Ophthalmology

## 2021-08-01 ENCOUNTER — Encounter (INDEPENDENT_AMBULATORY_CARE_PROVIDER_SITE_OTHER): Payer: Self-pay | Admitting: Ophthalmology

## 2021-08-01 DIAGNOSIS — Z23 Encounter for immunization: Secondary | ICD-10-CM | POA: Diagnosis not present

## 2021-08-01 DIAGNOSIS — I10 Essential (primary) hypertension: Secondary | ICD-10-CM

## 2021-08-01 DIAGNOSIS — H353132 Nonexudative age-related macular degeneration, bilateral, intermediate dry stage: Secondary | ICD-10-CM

## 2021-08-01 DIAGNOSIS — H353112 Nonexudative age-related macular degeneration, right eye, intermediate dry stage: Secondary | ICD-10-CM | POA: Diagnosis not present

## 2021-08-01 DIAGNOSIS — Z961 Presence of intraocular lens: Secondary | ICD-10-CM

## 2021-08-01 DIAGNOSIS — H35033 Hypertensive retinopathy, bilateral: Secondary | ICD-10-CM

## 2021-08-01 DIAGNOSIS — H3581 Retinal edema: Secondary | ICD-10-CM

## 2021-08-01 DIAGNOSIS — H353221 Exudative age-related macular degeneration, left eye, with active choroidal neovascularization: Secondary | ICD-10-CM

## 2021-08-05 ENCOUNTER — Encounter (INDEPENDENT_AMBULATORY_CARE_PROVIDER_SITE_OTHER): Payer: Self-pay | Admitting: Ophthalmology

## 2021-08-05 MED ORDER — AFLIBERCEPT 2MG/0.05ML IZ SOLN FOR KALEIDOSCOPE
2.0000 mg | INTRAVITREAL | Status: AC | PRN
  Administered 2021-08-01: 2 mg via INTRAVITREAL

## 2021-08-05 NOTE — Progress Notes (Signed)
Cardiology Office Note:   Date:  08/07/2021  NAME:  Amanda [name]    MRN: [id number] DOB:  28-Oct-1946   PCP:  Cari Caraway, MD  Cardiologist:  None  Electrophysiologist:  None   Referring MD: Cari Caraway, MD   Chief Complaint  Patient presents with   Follow-up   History of Present Illness:   Amanda [name] is a 75 y.o. female with a hx of CLL and pAF who presents for follow-up. No recurrence in nearly 10 years.  She reports she is doing well.  Still has had no recurrence of atrial fibrillation.  Denies any chest pain or trouble breathing.  Walking in the mornings up to 30 minutes.  No issues.  Blood pressure well controlled 122/72.  She did stop digoxin.  EKG in office today demonstrates sinus rhythm with no evidence of infarction.  We discussed again that her risk of stroke is likely exceedingly low given that she is had no recurrence of her A. fib.  She had paroxysmal A. fib in 2012.  She converted back to sinus rhythm during that admission.  I reviewed the records from that hospitalization.  She had a brief SVT that was labeled as atrial fibrillation.  She quickly converted back to sinus rhythm during that admission and had no further episodes of A. fib.  She also had an orthopedic surgery later that year without any evidence of arrhythmia.  She has had none from what I can tell 10 years.  Problem List 1. CLL 2. SVT/Paroxysmal Afib  -Diagnosed 2012.  Converted back to normal rhythm during that admission.  No further episodes 3. HTN  Past Medical History: Past Medical History:  Diagnosis Date   A-fib ([place])    Adenopathy    Atrial fibrillation (HCC)    CLL (chronic lymphocytic leukemia) ([place]) 08/31/2013   HTN (hypertension)    Hyperlipidemia    Lymphocytosis    Macular degeneration    OU    Past Surgical History: Past Surgical History:  Procedure Laterality Date   ABDOMINAL HYSTERECTOMY     endometriosis, fibroid   APPENDECTOMY     BREAST BIOPSY     benign cyst.    CATARACT EXTRACTION Bilateral 2019   CHOLECYSTECTOMY     COLONOSCOPY  11/2012   Per Dr. Cristina Gong.  neg.    IR FLUORO GUIDE PORT INSERTION RIGHT  08/12/2017   IR REMOVAL TUN ACCESS W/ PORT W/O FL MOD SED  01/29/2018   IR US GUIDE VASC ACCESS RIGHT  08/12/2017   SQUAMOUS CELL CARCINOMA EXCISION  12/28/2020   Excised from top of head   THUMB ARTHROSCOPY     TOTAL HIP ARTHROPLASTY  2010    Current Medications: Current Meds  Medication Sig   acetaminophen (TYLENOL) 500 MG tablet Take 500 mg by mouth every 6 (six) hours as needed for pain.   amLODipine (NORVASC) 5 MG tablet Take 5 mg by mouth daily.   atorvastatin (LIPITOR) 40 MG tablet Take 40 mg by mouth daily.   Calcium Carb-Cholecalciferol (CALCIUM + D3) 600-200 MG-UNIT TABS Take 1 tablet by mouth 3 (three) times a week.    fluticasone (FLONASE) 50 MCG/ACT nasal spray Place 2 sprays into the nose daily.   folic acid (FOLVITE) 1 MG tablet Take 1 mg by mouth daily.   hydrochlorothiazide (HYDRODIURIL) 12.5 MG tablet Take 12.5 mg by mouth daily.   ibandronate (BONIVA) 150 MG tablet Take 150 mg by mouth every 30 (thirty) days.   metoprolol (LOPRESSOR)  50 MG tablet Take 50 mg by mouth 2 (two) times daily.   Multiple Vitamin (MULTIVITAMIN) tablet Take 1 tablet by mouth daily.   Multiple Vitamins-Minerals (EYE VITAMINS & MINERALS) TABS Take by mouth.   potassium chloride SA (K-DUR,KLOR-CON) 20 MEQ tablet Take 20 mEq by mouth 3 (three) times a week. Mon, Wed, Fri   [DISCONTINUED] ELIQUIS 5 MG TABS tablet Take 5 mg by mouth 2 (two) times daily.     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   Tobacco Use   Smoking status: Never   Smokeless tobacco: Never  Substance and Sexual Activity   Alcohol use: No   Drug use: No   Sexual activity: Not on file  Other Topics Concern   Not on file  Social History Narrative   Niece Daney Moor is MPOA   Social Determinants of Health   Financial Resource Strain: Not on file  Food Insecurity: Not on file  Transportation Needs: Not on file  Physical Activity: Not on file  Stress: Not on file  Social Connections: Not on file     Family History: The patient's family history includes Cancer in her father and mother; Renal Disease in her mother.  ROS:   All other ROS reviewed and negative. Pertinent positives noted in the HPI.     EKGs/Labs/Other Studies Reviewed:   The following studies were personally reviewed by me today:  EKG:  EKG is ordered today.  The ekg ordered today demonstrates sinus bradycardia heart rate 52, no acute ischemic changes, no evidence of infarction, and was personally reviewed by me.   Recent Labs: 05/29/2021: Hemoglobin 13.1; Platelets 153   Recent Lipid Panel    Component Value Date/Time   CHOL  01/02/2009 0212    143        ATP III CLASSIFICATION:  <200     mg/dL   Desirable  200-239  mg/dL   Borderline High  >=240    mg/dL   High          TRIG 201 (H) 01/02/2009 0212   HDL 45 01/02/2009 0212   CHOLHDL 3.2 01/02/2009 0212   VLDL 40 01/02/2009 0212   LDLCALC  01/02/2009 0212    58        Total Cholesterol/HDL:CHD Risk Coronary Heart Disease Risk Table                     Men   Women  1/2 Average Risk   3.4   3.3  Average Risk       5.0   4.4  2 X Average Risk   9.6   7.1  3 X Average Risk  23.4   11.0        Use the calculated Patient Ratio above and the CHD Risk Table to determine the patient's CHD Risk.        ATP III CLASSIFICATION (LDL):  <100     mg/dL   Optimal  100-129  mg/dL   Near or Above  Optimal  130-159  mg/dL   Borderline  160-189   mg/dL   High  >190     mg/dL   Very High    Physical Exam:   VS:  BP 122/72   Pulse (!) 56   Ht 5\' 2"  (1.575 m)   Wt 144 lb 3.2 oz (65.4 kg)   SpO2 97%   BMI 26.37 kg/m    Wt Readings from Last 3 Encounters:  08/07/21 144 lb 3.2 oz (65.4 kg)  05/29/21 146 lb 3.2 oz (66.3 kg)  07/06/20 141 lb 12.8 oz (64.3 kg)    General: Well nourished, well developed, in no acute distress Head: Atraumatic, normal size  Eyes: PEERLA, EOMI  Neck: Supple, no JVD Endocrine: No thryomegaly Cardiac: Normal S1, S2; RRR; no murmurs, rubs, or gallops Lungs: Clear to auscultation bilaterally, no wheezing, rhonchi or rales  Abd: Soft, nontender, no hepatomegaly  Ext: No edema, pulses 2+ Musculoskeletal: No deformities, BUE and BLE strength normal and equal Skin: Warm and dry, no rashes   Neuro: Alert and oriented to person, place, time, and situation, CNII-XII grossly intact, no focal deficits  Psych: Normal mood and affect   ASSESSMENT:   Amanda [name] is a 75 y.o. female who presents for the following: 1. Paroxysmal atrial fibrillation (HCC)     PLAN:   1. Paroxysmal atrial fibrillation (HCC) -History of SVT with concerns for A. fib in 2012.  She was admitted to the hospital and quickly converted back to normal rhythm.  She has had no further recurrence of A. fib in nearly 10 years.  I discussed with her that this was likely an isolated incident.  I see no need for continued anticoagulation.  She is in agreement with this.  She stopped her digoxin earlier this year.  For now I am okay to forego anticoagulation.  She will continue to monitor things for now.  If it does recur with continue going back on anticoagulation at this point I really see this is unnecessary.  I will plan to see her on a yearly basis.  Disposition: Return in about 1 year (around 08/07/2022).  Medication Adjustments/Labs and Tests Ordered: Current medicines are reviewed at length with the patient today.  Concerns regarding  medicines are outlined above.  Orders Placed This Encounter  Procedures   EKG 12-Lead    No orders of the defined types were placed in this encounter.   Patient Instructions  Medication Instructions:  Stop Eliquis   *If you need a refill on your cardiac medications before your next appointment, please call your pharmacy*  Follow-Up: At [facility], you and your health needs are our priority.  As part of our continuing mission to provide you with exceptional heart care, we have created designated Provider Care Teams.  These Care Teams include your primary Cardiologist (physician) and Advanced Practice Providers (APPs -  Physician Assistants and Nurse Practitioners) who all work together to provide you with the care you need, when you need it.  We recommend signing up for the patient portal called "MyChart".  Sign up information is provided on this After Visit Summary.  MyChart is used to connect with patients for Virtual Visits (Telemedicine).  Patients are able to view lab/test results, encounter notes, upcoming appointments, etc.  Non-urgent messages can be sent to your provider as well.   To learn more about what you can do with MyChart, go to [email or web address].    Your next appointment:   12 month(s)  The format for your next appointment:   In Person  Provider:   You may see Eleonore Chiquito, MD or one of the following Advanced Practice Providers on your designated Care Team:   Almyra Deforest, PA-C Fabian Sharp, [place] or  Roby Lofts, PA-C     Time Spent with Patient: I have spent a total of 35 minutes with patient reviewing hospital notes, telemetry, EKGs, labs and examining the patient as well as establishing an assessment and plan that was discussed with the patient.  > 50% of time was spent in direct patient care.  Signed, Addison Naegeli. Audie Box, MD, [place]  [address] [place],  [address] [phone]  08/07/2021 2:26 PM

## 2021-08-07 ENCOUNTER — Other Ambulatory Visit: Payer: Self-pay

## 2021-08-07 ENCOUNTER — Encounter: Payer: Self-pay | Admitting: Cardiovascular Disease

## 2021-08-07 ENCOUNTER — Ambulatory Visit (INDEPENDENT_AMBULATORY_CARE_PROVIDER_SITE_OTHER): Payer: Medicare Other | Admitting: Cardiovascular Disease

## 2021-08-07 VITALS — BP 122/72 | HR 56 | Ht 62.0 in | Wt 144.2 lb

## 2021-08-07 DIAGNOSIS — I480 Paroxysmal atrial fibrillation: Secondary | ICD-10-CM | POA: Diagnosis not present

## 2021-08-07 NOTE — Patient Instructions (Signed)
Medication Instructions:  Stop Eliquis   *If you need a refill on your cardiac medications before your next appointment, please call your pharmacy*  Follow-Up: At [facility], you and your health needs are our priority.  As part of our continuing mission to provide you with exceptional heart care, we have created designated Provider Care Teams.  These Care Teams include your primary Cardiologist (physician) and Advanced Practice Providers (APPs -  Physician Assistants and Nurse Practitioners) who all work together to provide you with the care you need, when you need it.  We recommend signing up for the patient portal called "MyChart".  Sign up information is provided on this After Visit Summary.  MyChart is used to connect with patients for Virtual Visits (Telemedicine).  Patients are able to view lab/test results, encounter notes, upcoming appointments, etc.  Non-urgent messages can be sent to your provider as well.   To learn more about what you can do with MyChart, go to [email or web address].    Your next appointment:   12 month(s)  The format for your next appointment:   In Person  Provider:   You may see Eleonore Chiquito, MD or one of the following Advanced Practice Providers on your designated Care Team:   Almyra Deforest, PA-C Fabian Sharp, PA-C or  Roby Lofts, [place]

## 2021-08-16 DIAGNOSIS — Z23 Encounter for immunization: Secondary | ICD-10-CM | POA: Diagnosis not present

## 2021-08-28 DIAGNOSIS — L565 Disseminated superficial actinic porokeratosis (DSAP): Secondary | ICD-10-CM | POA: Diagnosis not present

## 2021-08-28 DIAGNOSIS — Z85828 Personal history of other malignant neoplasm of skin: Secondary | ICD-10-CM | POA: Diagnosis not present

## 2021-08-28 DIAGNOSIS — S81851A Open bite, right lower leg, initial encounter: Secondary | ICD-10-CM | POA: Diagnosis not present

## 2021-08-28 DIAGNOSIS — L820 Inflamed seborrheic keratosis: Secondary | ICD-10-CM | POA: Diagnosis not present

## 2021-08-28 DIAGNOSIS — R590 Localized enlarged lymph nodes: Secondary | ICD-10-CM | POA: Diagnosis not present

## 2021-08-28 DIAGNOSIS — L578 Other skin changes due to chronic exposure to nonionizing radiation: Secondary | ICD-10-CM | POA: Diagnosis not present

## 2021-08-28 DIAGNOSIS — L821 Other seborrheic keratosis: Secondary | ICD-10-CM | POA: Diagnosis not present

## 2021-08-28 DIAGNOSIS — L570 Actinic keratosis: Secondary | ICD-10-CM | POA: Diagnosis not present

## 2021-08-28 DIAGNOSIS — D225 Melanocytic nevi of trunk: Secondary | ICD-10-CM | POA: Diagnosis not present

## 2021-08-28 DIAGNOSIS — Z23 Encounter for immunization: Secondary | ICD-10-CM | POA: Diagnosis not present

## 2021-08-28 NOTE — Progress Notes (Signed)
[facility] Note  08/29/2021     CHIEF COMPLAINT Patient presents for Retina Follow Up   HISTORY OF PRESENT ILLNESS: Amanda [name] is a 75 y.o. female who presents to the clinic today for:  HPI     Retina Follow Up   Patient presents with  Wet AMD.  In left eye.  This started 4 weeks ago.  I, the attending physician,  performed the HPI with the patient and updated documentation appropriately.        Comments   Patient here for 4 weeks retina follow up for exu ARMD OS.  Patient states vision doing the same. No eye pain.       Last edited by Bernarda Caffey, MD on 08/31/2021 12:12 PM.     Referring physician: Shirleen Schirmer, PA-C [facility] [address] [place],  [place] [address]  HISTORICAL INFORMATION:  Selected notes from the MEDICAL RECORD NUMBER Referred by Shirleen Schirmer, PA on 11.16.21 for eval of dry to wet AMD conversion OS.   CURRENT MEDICATIONS: No current outpatient medications on file. (Ophthalmic Drugs)   No current facility-administered medications for this visit. (Ophthalmic Drugs)   Current Outpatient Medications (Other)  Medication Sig   acetaminophen (TYLENOL) 500 MG tablet Take 500 mg by mouth every 6 (six) hours as needed for pain.   amLODipine (NORVASC) 5 MG tablet Take 5 mg by mouth daily.   atorvastatin (LIPITOR) 40 MG tablet Take 40 mg by mouth daily.   Calcium Carb-Cholecalciferol (CALCIUM + D3) 600-200 MG-UNIT TABS Take 1 tablet by mouth 3 (three) times a week.    fluticasone (FLONASE) 50 MCG/ACT nasal spray Place 2 sprays into the nose daily.   folic acid (FOLVITE) 1 MG tablet Take 1 mg by mouth daily.   hydrochlorothiazide (HYDRODIURIL) 12.5 MG tablet Take 12.5 mg by mouth daily.   ibandronate (BONIVA) 150 MG tablet Take 150 mg by mouth every 30 (thirty) days.   metoprolol (LOPRESSOR) 50 MG tablet Take 50 mg by mouth 2 (two) times daily.   Multiple Vitamin (MULTIVITAMIN) tablet  Take 1 tablet by mouth daily.   Multiple Vitamins-Minerals (EYE VITAMINS & MINERALS) TABS Take by mouth.   potassium chloride SA (K-DUR,KLOR-CON) 20 MEQ tablet Take 20 mEq by mouth 3 (three) times a week. Mon, Wed, Fri   No current facility-administered medications for this visit. (Other)   REVIEW OF SYSTEMS: ROS   Positive for: Skin, Cardiovascular, Eyes, Heme/Lymph Negative for: Constitutional, Gastrointestinal, Neurological, Genitourinary, Musculoskeletal, HENT, Endocrine, Respiratory, Psychiatric, Allergic/Imm Last edited by Theodore Demark, COA on 08/29/2021  2:45 PM.    ALLERGIES Allergies  Allergen Reactions   Codeine Nausea And Vomiting   Demerol [Meperidine] Nausea And Vomiting   Morphine And Related Nausea And Vomiting   Ciprofloxacin Rash   Sulfa Antibiotics Rash   PAST MEDICAL HISTORY Past Medical History:  Diagnosis Date   A-fib ([place])    Adenopathy    Atrial fibrillation ([place])    CLL (chronic lymphocytic leukemia) ([place]) 08/31/2013   HTN (hypertension)    Hyperlipidemia    Lymphocytosis    Macular degeneration    OU   Past Surgical History:  Procedure Laterality Date   ABDOMINAL HYSTERECTOMY     endometriosis, fibroid   APPENDECTOMY     BREAST BIOPSY     benign cyst.   CATARACT EXTRACTION Bilateral 2019   CHOLECYSTECTOMY     COLONOSCOPY  11/2012   Per Dr.  Buccini.  neg.    IR FLUORO GUIDE PORT INSERTION RIGHT  08/12/2017   IR REMOVAL TUN ACCESS W/ PORT W/O FL MOD SED  01/29/2018   IR US GUIDE VASC ACCESS RIGHT  08/12/2017   SQUAMOUS CELL CARCINOMA EXCISION  12/28/2020   Excised from top of head   THUMB ARTHROSCOPY     TOTAL HIP ARTHROPLASTY  2010   FAMILY HISTORY Family History  Problem Relation Age of Onset   Renal Disease Mother    Cancer Mother        colon cancer   Cancer Father        colon cancer   SOCIAL HISTORY Social History   Tobacco Use   Smoking status: Never   Smokeless tobacco: Never  Substance Use Topics   Alcohol use: No    Drug use: No       OPHTHALMIC EXAM: Base Eye Exam     Visual Acuity (Snellen - Linear)       Right Left   Dist cc 20/30 20/20 -2   Dist ph cc NI     Correction: Glasses         Tonometry (Tonopen, 2:42 PM)       Right Left   Pressure 17 15         Pupils       Dark Light Shape React APD   Right 3 2 Round Brisk None   Left 3 2 Round Brisk None         Visual Fields (Counting fingers)       Left Right    Full          Extraocular Movement       Right Left    Full, Ortho Full, Ortho         Neuro/Psych     Oriented x3: Yes   Mood/Affect: Normal         Dilation     Both eyes: 1.0% Mydriacyl, 2.5% Phenylephrine @ 2:42 PM           Slit Lamp and Fundus Exam     Slit Lamp Exam       Right Left   Lids/Lashes Dermatochalasis - upper lid, mild Meibomian gland dysfunction Dermatochalasis - upper lid, mild Meibomian gland dysfunction   Conjunctiva/Sclera White and quiet White and quiet   Cornea Trace Debris in tear film, well healed temporal cataract wounds, 1+ pigmented [place]utt[place] well healed temporal cataract wounds, 1+ Punctate epithelial erosions   Anterior Chamber Deep and quiet Deep and quiet   Iris Round and dilated Round and dilated   Lens PC IOL in good position PC IOL in good position   Vitreous Trace Vitreous syneresis, Posterior vitreous detachment Trace Vitreous syneresis, Posterior vitreous detachment         Fundus Exam       Right Left   Disc Compact, Pallor, Sharp rim, mild PPA Compact, Pink and Sharp, temporal PPP   C/D Ratio 0.1 0.2   Macula Flat, Blunted foveal reflex, Drusen, RPE mottling, clumping and early atrophy, +PED's, No heme or edema Blunted foveal reflex, +CNV, Drusen, RPE mottling and clumping; Persistent shallow SRF overlying PED--slightly improved, no heme   Vessels attenuated, Tortuous attenuated, Tortuous   Periphery Attached; mild reticular degeneration, no heme Attached, reticular degeneration, No  heme            Refraction     Wearing Rx       Sphere Cylinder Axis Add  Right -0.25 +0.75 165 +2.50   Left -0.50 +0.50 012 +2.50    Type: PAL           IMAGING AND PROCEDURES  Imaging and Procedures for 08/29/2021  OCT, Retina - OU - Both Eyes       Right Eye Quality was good. Central Foveal Thickness: 330. Progression has been stable. Findings include no IRF, no SRF, retinal drusen , pigment epithelial detachment, outer retinal atrophy, normal foveal contour.   Left Eye Quality was good. Central Foveal Thickness: 288. Progression has improved. Findings include abnormal foveal contour, subretinal hyper-reflective material, no IRF, pigment epithelial detachment, choroidal neovascular membrane, outer retinal atrophy, retinal drusen , subretinal fluid (Mild interval improvement in SRF overlying stable PED).   Notes *Images captured and stored on drive  Diagnosis / Impression:  OD: non-exu ARMD -- prominent central PEDs OS: exu ARMD -- Mild interval improvement in SRF overlying stable PED  Clinical management:  See below  Abbreviations: NFP - Normal foveal profile. CME - cystoid macular edema. PED - pigment epithelial detachment. IRF - intraretinal fluid. SRF - subretinal fluid. EZ - ellipsoid zone. ERM - epiretinal membrane. ORA - outer retinal atrophy. ORT - outer retinal tubulation. SRHM - subretinal hyper-reflective material. IRHM - intraretinal hyper-reflective material      Intravitreal Injection, Pharmacologic Agent - OS - Left Eye       Time Out 08/29/2021. 3:47 PM. Confirmed correct patient, procedure, site, and patient consented.   Anesthesia Topical anesthesia was used. Anesthetic medications included Lidocaine 2%, Proparacaine 0.5%.   Procedure Preparation included 5% betadine to ocular surface, eyelid speculum. A (32g) needle was used.   Injection: 2 mg aflibercept 2 MG/0.05ML   Route: Intravitreal, Site: Left Eye   NDC: [id number], Lot:  [id number], Expiration date: 07/10/2022, Waste: 0.05 mL   Post-op Post injection exam found visual acuity of at least counting fingers. The patient tolerated the procedure well. There were no complications. The patient received written and verbal post procedure care education. Post injection medications were not given.            ASSESSMENT/PLAN:    ICD-10-CM   1. Exudative age-related macular degeneration of left eye with active choroidal neovascularization (HCC)  H35.3221 Intravitreal Injection, Pharmacologic Agent - OS - Left Eye    aflibercept (EYLEA) SOLN 2 mg    2. Retinal edema  H35.81 OCT, Retina - OU - Both Eyes    3. Intermediate stage nonexudative age-related macular degeneration of both eyes  H35.3132     4. Essential hypertension  I10     5. Hypertensive retinopathy of both eyes  H35.033     6. Pseudophakia, both eyes  Z96.1      1,2. Exudative age related macular degeneration, OS  - [facility] pt with known history of nonexudative ARMD OU  - acute "haze over vision" OS -- onset, Friday 11.12.21 -- initial exam with central subretinal heme  - s/p IVA OS #1 (11.17.21), #2 (12.15.21), #3 (01.12.22), #4 (02.09.22), #5 (03.16.22), #6 (04.20.22), #7 (06.01.22), #8 (06.29.22)--IVA resistance  - s/p IVE OS #1 (07.27.22), #2 (08.24.22), #3 (09.21.22)  - BCVA 20/20  - exam and OCT shows Mild interval improvement in SRF overlying stable PED at 4 wks  - recommend IVE OS #4 (10.19.22) - pt wishes to be treated with IVE OS - RBA of procedure discussed, questions answered - informed consent obtained and signed - see procedure note  - f/u in 4 wks -- DFE/OCT,  possible injection  3. Age related macular degeneration, non-exudative OD  - The incidence, anatomy, and pathology of dry AMD, risk of progression, and the AREDS and AREDS 2 study including smoking risks discussed with patient.  - recommend Amsler grid monitoring  4,5. Hypertensive retinopathy OU - discussed  importance of tight BP control - monitor  6. Pseudophakia OU  - s/p CE/IOL OU  - IOLs in good position, doing well  - monitor  Ophthalmic Meds Ordered this visit:  Meds ordered this encounter  Medications   aflibercept (EYLEA) SOLN 2 mg     Return in 4 weeks (on 09/26/2021) for exu ARMD OS, Dilated Exam, OCT, Possible Injxn.  There are no Patient Instructions on file for this visit.  This document serves as a record of services personally performed by Gardiner Sleeper, MD, PhD. It was created on their behalf by Leeann Must, [place], an ophthalmic technician. The creation of this record is the provider's dictation and/or activities during the visit.    Electronically signed by: Leeann Must, COA @TODAY @ 12:34 PM  Gardiner Sleeper, M.D., Ph.D. Diseases & Surgery of the Retina and [place] 08/29/2021   I have reviewed the above documentation for accuracy and completeness, and I agree with the above. Gardiner Sleeper, M.D., Ph.D. 08/31/21 12:34 PM    Abbreviations: M myopia (nearsighted); A astigmatism; H hyperopia (farsighted); P presbyopia; Mrx spectacle prescription;  CTL contact lenses; OD right eye; OS left eye; OU both eyes  XT exotropia; ET esotropia; PEK punctate epithelial keratitis; PEE punctate epithelial erosions; DES dry eye syndrome; MGD meibomian gland dysfunction; ATs artificial tears; PFAT's preservative free artificial tears; [place] nuclear sclerotic cataract; PSC posterior subcapsular cataract; ERM epi-retinal membrane; PVD posterior vitreous detachment; RD retinal detachment; DM diabetes mellitus; DR diabetic retinopathy; NPDR non-proliferative diabetic retinopathy; PDR proliferative diabetic retinopathy; CSME clinically significant macular edema; DME diabetic macular edema; dbh dot blot hemorrhages; CWS cotton wool spot; POAG primary open angle glaucoma; C/D cup-to-disc ratio; HVF humphrey visual field; GVF goldmann visual field; OCT  optical coherence tomography; IOP intraocular pressure; BRVO Branch retinal vein occlusion; CRVO central retinal vein occlusion; CRAO central retinal artery occlusion; BRAO branch retinal artery occlusion; RT retinal tear; SB scleral buckle; PPV pars plana vitrectomy; VH Vitreous hemorrhage; PRP panretinal laser photocoagulation; IVK intravitreal kenalog; VMT vitreomacular traction; MH Macular hole;  NVD neovascularization of the disc; NVE neovascularization elsewhere; AREDS age related eye disease study; ARMD age related macular degeneration; POAG primary open angle glaucoma; EBMD epithelial/anterior basement membrane dystrophy; ACIOL anterior chamber intraocular lens; IOL intraocular lens; PCIOL posterior chamber intraocular lens; Phaco/IOL phacoemulsification with intraocular lens placement; [place] photorefractive keratectomy; LASIK laser assisted in situ keratomileusis; HTN hypertension; DM diabetes mellitus; COPD chronic obstructive pulmonary disease

## 2021-08-29 ENCOUNTER — Encounter (INDEPENDENT_AMBULATORY_CARE_PROVIDER_SITE_OTHER): Payer: Self-pay | Admitting: Ophthalmology

## 2021-08-29 ENCOUNTER — Ambulatory Visit (INDEPENDENT_AMBULATORY_CARE_PROVIDER_SITE_OTHER): Payer: Medicare Other | Admitting: Ophthalmology

## 2021-08-29 ENCOUNTER — Other Ambulatory Visit: Payer: Self-pay

## 2021-08-29 DIAGNOSIS — I10 Essential (primary) hypertension: Secondary | ICD-10-CM

## 2021-08-29 DIAGNOSIS — H353132 Nonexudative age-related macular degeneration, bilateral, intermediate dry stage: Secondary | ICD-10-CM | POA: Diagnosis not present

## 2021-08-29 DIAGNOSIS — Z961 Presence of intraocular lens: Secondary | ICD-10-CM | POA: Diagnosis not present

## 2021-08-29 DIAGNOSIS — H35033 Hypertensive retinopathy, bilateral: Secondary | ICD-10-CM

## 2021-08-29 DIAGNOSIS — H353221 Exudative age-related macular degeneration, left eye, with active choroidal neovascularization: Secondary | ICD-10-CM

## 2021-08-29 DIAGNOSIS — H3581 Retinal edema: Secondary | ICD-10-CM

## 2021-08-30 ENCOUNTER — Telehealth: Payer: Self-pay

## 2021-08-30 NOTE — Telephone Encounter (Signed)
Called and given below message. Scheduled 10/27 and she is aware of appt time/date.

## 2021-08-30 NOTE — Telephone Encounter (Signed)
She left a message. She saw Dr. Renda Rolls, dermatologist recently and she felt enlarged lymph nodes in her neck. She was told to contact you. Dr. Renda Rolls is going to send her note to the office.

## 2021-08-30 NOTE — Telephone Encounter (Signed)
I can see her on 10/25 or 10/27, 20 mins

## 2021-08-31 ENCOUNTER — Encounter (INDEPENDENT_AMBULATORY_CARE_PROVIDER_SITE_OTHER): Payer: Self-pay | Admitting: Ophthalmology

## 2021-08-31 MED ORDER — AFLIBERCEPT 2MG/0.05ML IZ SOLN FOR KALEIDOSCOPE
2.0000 mg | INTRAVITREAL | Status: AC | PRN
  Administered 2021-08-29: 2 mg via INTRAVITREAL

## 2021-09-06 ENCOUNTER — Inpatient Hospital Stay: Payer: Medicare Other | Attending: Hematology and Oncology | Admitting: Hematology and Oncology

## 2021-09-06 ENCOUNTER — Encounter: Payer: Self-pay | Admitting: Hematology and Oncology

## 2021-09-06 ENCOUNTER — Other Ambulatory Visit: Payer: Self-pay

## 2021-09-06 DIAGNOSIS — R591 Generalized enlarged lymph nodes: Secondary | ICD-10-CM | POA: Insufficient documentation

## 2021-09-06 DIAGNOSIS — C9110 Chronic lymphocytic leukemia of B-cell type not having achieved remission: Secondary | ICD-10-CM | POA: Diagnosis not present

## 2021-09-06 DIAGNOSIS — Z85828 Personal history of other malignant neoplasm of skin: Secondary | ICD-10-CM | POA: Diagnosis not present

## 2021-09-06 NOTE — Assessment & Plan Note (Signed)
She has palpable lymphadenopathy in the submandibular region most likely due to CLL recurrence but overall she is not symptomatic I cannot appreciate lymphadenopathy elsewhere It is not unusual for CLL to recur with lymphadenopathy without causing leukocytosis I reassured the patient given lack of symptoms, she can be observed and monitored without imaging study I am not keen to order imaging study because it would not benefit her as it would not change management The patient does not need to restart treatment at this point in time I recommend close monitoring of the lymph node; if it gets progressively larger quickly before her next appointment in July, the patient is instructed to call me She is reassured

## 2021-09-06 NOTE — Progress Notes (Signed)
Amanda [name] OFFICE PROGRESS NOTE  Patient Care Team: Cari Caraway, MD as PCP - General (Family Medicine) Irine Seal, MD as Attending Physician (Urology) Jacolyn Reedy, MD as Attending Physician (Cardiology) Heath Lark, MD as Consulting Physician (Hematology and Oncology)  ASSESSMENT & PLAN:  CLL (chronic lymphocytic leukemia) She has palpable lymphadenopathy in the submandibular region most likely due to CLL recurrence but overall she is not symptomatic I cannot appreciate lymphadenopathy elsewhere It is not unusual for CLL to recur with lymphadenopathy without causing leukocytosis I reassured the patient given lack of symptoms, she can be observed and monitored without imaging study I am not keen to order imaging study because it would not benefit her as it would not change management The patient does not need to restart treatment at this point in time I recommend close monitoring of the lymph node; if it gets progressively larger quickly before her next appointment in July, the patient is instructed to call me She is reassured  History of skin cancer She has no new skin cancer recently She will continue close monitoring and follow-up with dermatologist  No orders of the defined types were placed in this encounter.   All questions were answered. The patient knows to call the clinic with any problems, questions or concerns. The total time spent in the appointment was 20 minutes encounter with patients including review of chart and various tests results, discussions about plan of care and coordination of care plan   Heath Lark, MD 09/06/2021 11:47 AM  INTERVAL HISTORY: Please see below for problem oriented charting. she returns for follow-up due to recent palpable lymphadenopathy by her dermatologist The patient did not feel the lymph node herself She denies recent infection, fever or chills No recent dental issues No recent head and neck infection Her  appetite is good.  She denies abnormal weight loss or night sweats  REVIEW OF SYSTEMS:   Constitutional: Denies fevers, chills or abnormal weight loss Eyes: Denies blurriness of vision Ears, nose, mouth, throat, and face: Denies mucositis or sore throat Respiratory: Denies cough, dyspnea or wheezes Cardiovascular: Denies palpitation, chest discomfort or lower extremity swelling Gastrointestinal:  Denies nausea, heartburn or change in bowel habits Skin: Denies abnormal skin rashes Neurological:Denies numbness, tingling or new weaknesses Behavioral/Psych: Mood is stable, no new changes  All other systems were reviewed with the patient and are negative.  I have reviewed the past medical history, past surgical history, social history and family history with the patient and they are unchanged from previous note.  ALLERGIES:  is allergic to codeine, demerol [meperidine], morphine and related, ciprofloxacin, and sulfa antibiotics.  MEDICATIONS:  Current Outpatient Medications  Medication Sig Dispense Refill   acetaminophen (TYLENOL) 500 MG tablet Take 500 mg by mouth every 6 (six) hours as needed for pain.     amLODipine (NORVASC) 5 MG tablet Take 5 mg by mouth daily.     atorvastatin (LIPITOR) 40 MG tablet Take 40 mg by mouth daily.     Calcium Carb-Cholecalciferol (CALCIUM + D3) 600-200 MG-UNIT TABS Take 1 tablet by mouth 3 (three) times a week.      fluticasone (FLONASE) 50 MCG/ACT nasal spray Place 2 sprays into the nose daily.     folic acid (FOLVITE) 1 MG tablet Take 1 mg by mouth daily.     hydrochlorothiazide (HYDRODIURIL) 12.5 MG tablet Take 12.5 mg by mouth daily.     ibandronate (BONIVA) 150 MG tablet Take 150 mg by mouth every 30 (thirty)  days.     metoprolol (LOPRESSOR) 50 MG tablet Take 50 mg by mouth 2 (two) times daily.     Multiple Vitamin (MULTIVITAMIN) tablet Take 1 tablet by mouth daily.     Multiple Vitamins-Minerals (EYE VITAMINS & MINERALS) TABS Take by mouth.      potassium chloride SA (K-DUR,KLOR-CON) 20 MEQ tablet Take 20 mEq by mouth 3 (three) times a week. Mon, Wed, Fri     No current facility-administered medications for this visit.    SUMMARY OF ONCOLOGIC HISTORY: Oncology History Overview Note  Del 13q   CLL (chronic lymphocytic leukemia) ([place])  02/09/2013 Pathology Results   Peripheral Blood Flow Cytometry - FINDINGS CONSISTENT WITH CHRONIC LYMPHOCYTIC LEUKEMIA   09/02/2013 Pathology Results   FISH positive for deletion 13 q   05/22/2017 Imaging   Worsening mesenteric and retroperitoneal adenopathy concerning for worsening lymphoproliferative disorder/lymphoma.   Small cysts in the liver and kidneys are stable.   Prior cholecystectomy.   Aortic atherosclerosis.   Left colonic diverticulosis without active diverticulitis.   08/11/2017 Imaging   Chest Impression:  1. Mild mediastinal and hilar lymphadenopathy. 2. Clusters small axial lymph nodes.  Abdomen / Pelvis Impression:  1. Mild periaortic retroperitoneal adenopathy and moderate central mesenteric adenopathy not changed comparison CT. 2. Mild iliac adenopathy 3. Normal volume spleen. 4. No explanation for RIGHT lower quadrant pain. Cluster of lymph nodes in the ileocecal mesenteries similar to prior.   08/12/2017 Procedure   Placement of single lumen port a cath via right internal jugular vein. The catheter tip lies at the cavo-atrial junction. A power injectable port a cath was placed and is ready for immediate use.   08/14/2017 - 11/14/2017 Chemotherapy   She received Bendamustine and Rituxan   11/12/2017 Imaging   1. Continued decrease in mediastinal and hilar lymph nodes with interval resolution of the abdominal and pelvic lymphadenopathy seen on the prior study. There is no lymphadenopathy in the chest, abdomen, or pelvis by CT size criteria on today's study. No new or progressive interval findings. 2.  Aortic Atherosclerois (ICD10-170.0) 3. Hepatic and renal cysts,  stable.   12/18/2017 Adverse Reaction   We are unable to resume cycle 5 of chemotherapy due to prolonged pancytopenia   01/29/2018 Procedure   Successful right IJ vein Port-A-Cath explant.     PHYSICAL EXAMINATION: ECOG PERFORMANCE STATUS: 0 - Asymptomatic  Vitals:   09/06/21 1117  BP: (!) 132/58  Pulse: (!) 55  Resp: 18  Temp: 98.7 F (37.1 C)  SpO2: 100%   Filed Weights   09/06/21 1117  Weight: 145 lb 3.2 oz (65.9 kg)    GENERAL:alert, no distress and comfortable SKIN: skin color, texture, turgor are normal, no rashes or significant lesions.  She has multiple keratosis EYES: normal, Conjunctiva are pink and non-injected, sclera clear OROPHARYNX:no exudate, no erythema and lips, buccal mucosa, and tongue normal  NECK: She has palpable lymphadenopathy in the left submandibular region LYMPH:  no palpable lymphadenopathy in the cervical, axillary or inguinal LUNGS: clear to auscultation and percussion with normal breathing effort HEART: regular rate & rhythm and no murmurs and no lower extremity edema ABDOMEN:abdomen soft, non-tender and normal bowel sounds Musculoskeletal:no cyanosis of digits and no clubbing  NEURO: alert & oriented x 3 with fluent speech, no focal motor/sensory deficits  LABORATORY DATA:  I have reviewed the data as listed    Component Value Date/Time   NA 142 01/12/2019 0938   NA 141 11/13/2017 0753   K 4.1 01/12/2019  0938   K 4.1 11/13/2017 0753   CL 105 01/12/2019 0938   CL 104 02/08/2013 1454   CO2 28 01/12/2019 0938   CO2 27 11/13/2017 0753   GLUCOSE 113 (H) 01/12/2019 0938   GLUCOSE 113 11/13/2017 0753   GLUCOSE 103 (H) 02/08/2013 1454   BUN 14 01/12/2019 0938   BUN 11.6 11/13/2017 0753   CREATININE 0.98 01/12/2019 0938   CREATININE 0.8 11/13/2017 0753   CALCIUM 9.3 01/12/2019 0938   CALCIUM 9.3 11/13/2017 0753   PROT 6.7 01/12/2019 0938   PROT 6.2 (L) 11/13/2017 0753   ALBUMIN 4.0 01/12/2019 0938   ALBUMIN 3.3 (L) 11/13/2017 0753    AST 21 01/12/2019 0938   AST 24 11/13/2017 0753   ALT 25 01/12/2019 0938   ALT 24 11/13/2017 0753   ALKPHOS 67 01/12/2019 0938   ALKPHOS 83 11/13/2017 0753   BILITOT 0.7 01/12/2019 0938   BILITOT 0.38 11/13/2017 0753   GFRNONAA 57 (L) 01/12/2019 0938   GFRAA >60 01/12/2019 0938    No results found for: SPEP, UPEP  Lab Results  Component Value Date   WBC 8.3 05/29/2021   NEUTROABS 3.5 05/29/2021   HGB 13.1 05/29/2021   HCT 39.6 05/29/2021   MCV 83.5 05/29/2021   PLT 153 05/29/2021      Chemistry      Component Value Date/Time   NA 142 01/12/2019 0938   NA 141 11/13/2017 0753   K 4.1 01/12/2019 0938   K 4.1 11/13/2017 0753   CL 105 01/12/2019 0938   CL 104 02/08/2013 1454   CO2 28 01/12/2019 0938   CO2 27 11/13/2017 0753   BUN 14 01/12/2019 0938   BUN 11.6 11/13/2017 0753   CREATININE 0.98 01/12/2019 0938   CREATININE 0.8 11/13/2017 0753      Component Value Date/Time   CALCIUM 9.3 01/12/2019 0938   CALCIUM 9.3 11/13/2017 0753   ALKPHOS 67 01/12/2019 0938   ALKPHOS 83 11/13/2017 0753   AST 21 01/12/2019 0938   AST 24 11/13/2017 0753   ALT 25 01/12/2019 0938   ALT 24 11/13/2017 0753   BILITOT 0.7 01/12/2019 0938   BILITOT 0.38 11/13/2017 0753

## 2021-09-06 NOTE — Assessment & Plan Note (Signed)
She has no new skin cancer recently She will continue close monitoring and follow-up with dermatologist

## 2021-09-25 NOTE — Progress Notes (Signed)
[facility] Note  09/26/2021     CHIEF COMPLAINT Patient presents for Retina Follow Up   HISTORY OF PRESENT ILLNESS: Amanda [name] is a 75 y.o. female who presents to the clinic today for:  HPI     Retina Follow Up   Patient presents with  Wet AMD.  In left eye.  Duration of 4 weeks.  Since onset it is stable.  I, the attending physician,  performed the HPI with the patient and updated documentation appropriately.        Comments   4 week follow up Exu ARMD OS-  Vision appears about the same.       Last edited by Bernarda Caffey, MD on 09/26/2021  5:07 PM.     Referring physician: Shirleen Schirmer, PA-C [facility] [address] [place],  [place] [id number]  HISTORICAL INFORMATION:  Selected notes from the MEDICAL RECORD NUMBER Referred by Shirleen Schirmer, PA on 11.16.21 for eval of dry to wet AMD conversion OS.   CURRENT MEDICATIONS: No current outpatient medications on file. (Ophthalmic Drugs)   No current facility-administered medications for this visit. (Ophthalmic Drugs)   Current Outpatient Medications (Other)  Medication Sig   acetaminophen (TYLENOL) 500 MG tablet Take 500 mg by mouth every 6 (six) hours as needed for pain.   amLODipine (NORVASC) 5 MG tablet Take 5 mg by mouth daily.   atorvastatin (LIPITOR) 40 MG tablet Take 40 mg by mouth daily.   Calcium Carb-Cholecalciferol (CALCIUM + D3) 600-200 MG-UNIT TABS Take 1 tablet by mouth 3 (three) times a week.    fluticasone (FLONASE) 50 MCG/ACT nasal spray Place 2 sprays into the nose daily.   folic acid (FOLVITE) 1 MG tablet Take 1 mg by mouth daily.   hydrochlorothiazide (HYDRODIURIL) 12.5 MG tablet Take 12.5 mg by mouth daily.   ibandronate (BONIVA) 150 MG tablet Take 150 mg by mouth every 30 (thirty) days.   metoprolol (LOPRESSOR) 50 MG tablet Take 50 mg by mouth 2 (two) times daily.   Multiple Vitamin (MULTIVITAMIN) tablet Take 1 tablet by mouth daily.    Multiple Vitamins-Minerals (EYE VITAMINS & MINERALS) TABS Take by mouth.   potassium chloride SA (K-DUR,KLOR-CON) 20 MEQ tablet Take 20 mEq by mouth 3 (three) times a week. Mon, Wed, Fri   No current facility-administered medications for this visit. (Other)   REVIEW OF SYSTEMS: ROS   Positive for: Skin, Cardiovascular, Eyes, Heme/Lymph Negative for: Constitutional, Gastrointestinal, Neurological, Genitourinary, Musculoskeletal, HENT, Endocrine, Respiratory, Psychiatric, Allergic/Imm Last edited by Leonie Douglas, COA on 09/26/2021  2:04 PM.     ALLERGIES Allergies  Allergen Reactions   Codeine Nausea And Vomiting   Demerol [Meperidine] Nausea And Vomiting   Morphine And Related Nausea And Vomiting   Ciprofloxacin Rash   Sulfa Antibiotics Rash   PAST MEDICAL HISTORY Past Medical History:  Diagnosis Date   A-fib ([place])    Adenopathy    Atrial fibrillation ([place])    CLL (chronic lymphocytic leukemia) ([place]) 08/31/2013   HTN (hypertension)    Hyperlipidemia    Lymphocytosis    Macular degeneration    OU   Past Surgical History:  Procedure Laterality Date   ABDOMINAL HYSTERECTOMY     endometriosis, fibroid   APPENDECTOMY     BREAST BIOPSY     benign cyst.   CATARACT EXTRACTION Bilateral 2019   CHOLECYSTECTOMY     COLONOSCOPY  11/2012   Per Dr. Cristina Gong.  neg.  IR FLUORO GUIDE PORT INSERTION RIGHT  08/12/2017   IR REMOVAL TUN ACCESS W/ PORT W/O FL MOD SED  01/29/2018   IR US GUIDE VASC ACCESS RIGHT  08/12/2017   SQUAMOUS CELL CARCINOMA EXCISION  12/28/2020   Excised from top of head   THUMB ARTHROSCOPY     TOTAL HIP ARTHROPLASTY  2010   FAMILY HISTORY Family History  Problem Relation Age of Onset   Renal Disease Mother    Cancer Mother        colon cancer   Cancer Father        colon cancer   SOCIAL HISTORY Social History   Tobacco Use   Smoking status: Never   Smokeless tobacco: Never  Substance Use Topics   Alcohol use: No   Drug use: No        OPHTHALMIC EXAM: Base Eye Exam     Visual Acuity (Snellen - Linear)       Right Left   Dist cc 20/30 20/20 -2   Dist ph cc 20/25     Correction: Glasses         Tonometry (Tonopen, 2:10 PM)       Right Left   Pressure 16 14         Pupils       Dark Light Shape React APD   Right 3 2 Round Brisk None   Left 3 2 Round Brisk None         Visual Fields (Counting fingers)       Left Right    Full Full         Extraocular Movement       Right Left    Full Full         Neuro/Psych     Oriented x3: Yes   Mood/Affect: Normal         Dilation     Both eyes: 1.0% Mydriacyl, 2.5% Phenylephrine @ 2:10 PM           Slit Lamp and Fundus Exam     Slit Lamp Exam       Right Left   Lids/Lashes Dermatochalasis - upper lid, mild Meibomian gland dysfunction Dermatochalasis - upper lid, mild Meibomian gland dysfunction   Conjunctiva/Sclera White and quiet White and quiet   Cornea Trace Debris in tear film, [name] healed temporal cataract wounds, 1+ pigmented Guttata [name] healed temporal cataract wounds, 1+ Punctate epithelial erosions   Anterior Chamber Deep and quiet Deep and quiet   Iris Round and dilated Round and dilated   Lens PC IOL in good position PC IOL in good position   Vitreous Trace Vitreous syneresis, Posterior vitreous detachment Trace Vitreous syneresis, Posterior vitreous detachment         Fundus Exam       Right Left   Disc Compact, Pallor, Sharp rim, mild PPA Compact, Pink and Sharp, temporal PPP   C/D Ratio 0.1 0.2   Macula Flat, Blunted foveal reflex, Drusen, RPE mottling, clumping and early atrophy, +PED's, No heme or edema Blunted foveal reflex, +CNV, Drusen, RPE mottling and clumping; Persistent shallow SRF overlying PED, no heme   Vessels attenuated, Tortuous attenuated, mild tortuousity   Periphery Attached; mild reticular degeneration, no heme Attached, reticular degeneration, No heme            Refraction      Wearing Rx       Sphere Cylinder Axis Add   Right -0.25 +0.75 165 +2.50  Left -0.50 +0.50 012 +2.50    Type: PAL           IMAGING AND PROCEDURES  Imaging and Procedures for 09/26/2021  OCT, Retina - OU - Both Eyes       Right Eye Quality was good. Central Foveal Thickness: 312. Progression has been stable. Findings include no IRF, no SRF, retinal drusen , pigment epithelial detachment, outer retinal atrophy, normal foveal contour.   Left Eye Quality was good. Central Foveal Thickness: 273. Progression has improved. Findings include abnormal foveal contour, subretinal hyper-reflective material, no IRF, pigment epithelial detachment, choroidal neovascular membrane, outer retinal atrophy, retinal drusen , subretinal fluid (Mild interval improvement in SRF overlying stable PED).   Notes *Images captured and stored on drive  Diagnosis / Impression:  OD: non-exu ARMD -- prominent central PEDs OS: exu ARMD -- Mild interval improvement in SRF overlying stable PED  Clinical management:  See below  Abbreviations: NFP - Normal foveal profile. CME - cystoid macular edema. PED - pigment epithelial detachment. IRF - intraretinal fluid. SRF - subretinal fluid. EZ - ellipsoid zone. ERM - epiretinal membrane. ORA - outer retinal atrophy. ORT - outer retinal tubulation. SRHM - subretinal hyper-reflective material. IRHM - intraretinal hyper-reflective material      Intravitreal Injection, Pharmacologic Agent - OS - Left Eye       Time Out 09/26/2021. 2:41 PM. Confirmed correct patient, procedure, site, and patient consented.   Anesthesia Topical anesthesia was used. Anesthetic medications included Lidocaine 2%, Proparacaine 0.5%.   Procedure Preparation included 5% betadine to ocular surface, eyelid speculum. A (32g) needle was used.   Injection: 2 mg aflibercept 2 MG/0.05ML   Route: Intravitreal, Site: Left Eye   NDC: A3590391, Lot: [id number], Expiration date: 07/12/2022,  Waste: 0.05 mL   Post-op Post injection exam found visual acuity of at least counting fingers. The patient tolerated the procedure [name]. There were no complications. The patient received written and verbal post procedure care education. Post injection medications were not given.             ASSESSMENT/PLAN:    ICD-10-CM   1. Exudative age-related macular degeneration of left eye with active choroidal neovascularization (HCC)  H35.3221 Intravitreal Injection, Pharmacologic Agent - OS - Left Eye    aflibercept (EYLEA) SOLN 2 mg    2. Retinal edema  H35.81 OCT, Retina - OU - Both Eyes    3. Intermediate stage nonexudative age-related macular degeneration of both eyes  H35.3132     4. Essential hypertension  I10     5. Hypertensive retinopathy of both eyes  H35.033     6. Pseudophakia, both eyes  Z96.1       1,2. Exudative age related macular degeneration, OS  - [facility] pt with known history of nonexudative ARMD OU  - acute "haze over vision" OS -- onset, Friday 11.12.21 -- initial exam with central subretinal heme  - s/p IVA OS #1 (11.17.21), #2 (12.15.21), #3 (01.12.22), #4 (02.09.22), #5 (03.16.22), #6 (04.20.22), #7 (06.01.22), #8 (06.29.22)--IVA resistance  - s/p IVE OS #1 (07.27.22), #2 (08.24.22), #3 (09.21.22), #4 (10.19.22)  - BCVA 20/20  - exam and OCT shows Mild interval improvement in SRF overlying stable PED at 4 wks  - recommend IVE OS #5 (11.16.22) - pt wishes to be treated with IVE OS - RBA of procedure discussed, questions answered - informed consent obtained and signed - see procedure  - f/u in 4 wks -- DFE/OCT, possible injection  3.  Age related macular degeneration, non-exudative OD  - The incidence, anatomy, and pathology of dry AMD, risk of progression, and the AREDS and AREDS 2 study including smoking risks discussed with patient.  - recommend [place] grid monitoring  4,5. Hypertensive retinopathy OU - discussed importance of tight BP  control - monitor  6. Pseudophakia OU  - s/p CE/IOL OU  - IOLs in good position, doing [name]  - monitor  Ophthalmic Meds Ordered this visit:  Meds ordered this encounter  Medications   aflibercept (EYLEA) SOLN 2 mg      Return in about 4 weeks (around 10/24/2021) for f/u exu ARMD OS, DFE, OCT.  There are no Patient Instructions on file for this visit.  This document serves as a record of services personally performed by Gardiner Sleeper, MD, PhD. It was created on their behalf by Orvan Falconer, an ophthalmic technician. The creation of this record is the provider's dictation and/or activities during the visit.    Electronically signed by: Orvan Falconer, OA, 09/26/21  5:09 PM  This document serves as a record of services personally performed by Gardiner Sleeper, MD, PhD. It was created on their behalf by San Jetty. Owens Shark, OA an ophthalmic technician. The creation of this record is the provider's dictation and/or activities during the visit.    Electronically signed by: San Jetty. Owens Shark, [place] 11.16.2022 5:09 PM  Gardiner Sleeper, M.D., Ph.D. Diseases & Surgery of the Retina and Vitreous [facility]  I have reviewed the above documentation for accuracy and completeness, and I agree with the above. Gardiner Sleeper, M.D., Ph.D. 09/26/21 5:09 PM   Abbreviations: M myopia (nearsighted); A astigmatism; H hyperopia (farsighted); P presbyopia; Mrx spectacle prescription;  CTL contact lenses; OD right eye; OS left eye; OU both eyes  XT exotropia; ET esotropia; PEK punctate epithelial keratitis; PEE punctate epithelial erosions; DES dry eye syndrome; MGD meibomian gland dysfunction; ATs artificial tears; PFAT's preservative free artificial tears; [place] nuclear sclerotic cataract; PSC posterior subcapsular cataract; ERM epi-retinal membrane; PVD posterior vitreous detachment; RD retinal detachment; DM diabetes mellitus; DR diabetic retinopathy; NPDR non-proliferative diabetic  retinopathy; PDR proliferative diabetic retinopathy; CSME clinically significant macular edema; DME diabetic macular edema; dbh dot blot hemorrhages; CWS cotton wool spot; POAG primary open angle glaucoma; C/D cup-to-disc ratio; HVF humphrey visual field; GVF goldmann visual field; OCT optical coherence tomography; IOP intraocular pressure; BRVO Branch retinal vein occlusion; CRVO central retinal vein occlusion; CRAO central retinal artery occlusion; BRAO branch retinal artery occlusion; RT retinal tear; SB scleral buckle; PPV pars plana vitrectomy; VH Vitreous hemorrhage; PRP panretinal laser photocoagulation; IVK intravitreal kenalog; VMT vitreomacular traction; MH Macular hole;  NVD neovascularization of the disc; NVE neovascularization elsewhere; AREDS age related eye disease study; ARMD age related macular degeneration; POAG primary open angle glaucoma; EBMD epithelial/anterior basement membrane dystrophy; ACIOL anterior chamber intraocular lens; IOL intraocular lens; PCIOL posterior chamber intraocular lens; Phaco/IOL phacoemulsification with intraocular lens placement; [place] photorefractive keratectomy; LASIK laser assisted in situ keratomileusis; HTN hypertension; DM diabetes mellitus; COPD chronic obstructive pulmonary disease

## 2021-09-26 ENCOUNTER — Encounter (INDEPENDENT_AMBULATORY_CARE_PROVIDER_SITE_OTHER): Payer: Self-pay | Admitting: Ophthalmology

## 2021-09-26 ENCOUNTER — Ambulatory Visit (INDEPENDENT_AMBULATORY_CARE_PROVIDER_SITE_OTHER): Payer: Medicare Other | Admitting: Ophthalmology

## 2021-09-26 ENCOUNTER — Other Ambulatory Visit: Payer: Self-pay

## 2021-09-26 DIAGNOSIS — H35033 Hypertensive retinopathy, bilateral: Secondary | ICD-10-CM

## 2021-09-26 DIAGNOSIS — I10 Essential (primary) hypertension: Secondary | ICD-10-CM

## 2021-09-26 DIAGNOSIS — H3581 Retinal edema: Secondary | ICD-10-CM

## 2021-09-26 DIAGNOSIS — H353221 Exudative age-related macular degeneration, left eye, with active choroidal neovascularization: Secondary | ICD-10-CM

## 2021-09-26 DIAGNOSIS — H353132 Nonexudative age-related macular degeneration, bilateral, intermediate dry stage: Secondary | ICD-10-CM | POA: Diagnosis not present

## 2021-09-26 DIAGNOSIS — Z961 Presence of intraocular lens: Secondary | ICD-10-CM

## 2021-09-26 MED ORDER — AFLIBERCEPT 2MG/0.05ML IZ SOLN FOR KALEIDOSCOPE
2.0000 mg | INTRAVITREAL | Status: AC | PRN
Start: 2021-09-26 — End: 2021-09-26
  Administered 2021-09-26: 2 mg via INTRAVITREAL

## 2021-10-01 DIAGNOSIS — I480 Paroxysmal atrial fibrillation: Secondary | ICD-10-CM | POA: Diagnosis not present

## 2021-10-01 DIAGNOSIS — I10 Essential (primary) hypertension: Secondary | ICD-10-CM | POA: Diagnosis not present

## 2021-10-01 DIAGNOSIS — E782 Mixed hyperlipidemia: Secondary | ICD-10-CM | POA: Diagnosis not present

## 2021-10-25 NOTE — Progress Notes (Signed)
[facility] Note  10/26/2021     CHIEF COMPLAINT Patient presents for Retina Follow Up  HISTORY OF PRESENT ILLNESS: Amanda [name] is a 75 y.o. female who presents to the clinic today for:  HPI     Retina Follow Up   Patient presents with  Wet AMD.  In left eye.  This started years ago.  Severity is mild.  Duration of 4.5 weeks.  Since onset it is stable.  I, the attending physician,  performed the HPI with the patient and updated documentation appropriately.        Comments   75 y/o female pt here for 4.5 wk f/u for exu ARMD OS.  No change in [place] OU.  Denies pain, FOL, floaters.  Refresh prn OU.      Last edited by Bernarda Caffey, MD on 10/26/2021  3:46 PM.      Referring physician: Shirleen Schirmer, PA-C [facility] [address] [place],  [place] [address]  HISTORICAL INFORMATION:  Selected notes from the MEDICAL RECORD NUMBER Referred by Shirleen Schirmer, PA on 11.16.21 for eval of dry to wet AMD conversion OS.   CURRENT MEDICATIONS: No current outpatient medications on file. (Ophthalmic Drugs)   No current facility-administered medications for this visit. (Ophthalmic Drugs)   Current Outpatient Medications (Other)  Medication Sig   acetaminophen (TYLENOL) 500 MG tablet Take 500 mg by mouth every 6 (six) hours as needed for pain.   amLODipine (NORVASC) 5 MG tablet Take 5 mg by mouth daily.   atorvastatin (LIPITOR) 40 MG tablet Take 40 mg by mouth daily.   atorvastatin (LIPITOR) 40 MG tablet Take 1 tablet by mouth daily.   Calcium Carb-Cholecalciferol (CALCIUM + D3) 600-200 MG-UNIT TABS Take 1 tablet by mouth 3 (three) times a week.    fluticasone (FLONASE) 50 MCG/ACT nasal spray Place 2 sprays into the nose daily.   folic acid (FOLVITE) 1 MG tablet Take 1 mg by mouth daily.   hydrochlorothiazide (HYDRODIURIL) 12.5 MG tablet Take 12.5 mg by mouth daily.   ibandronate (BONIVA) 150 MG tablet Take 150 mg by mouth  every 30 (thirty) days.   metoprolol (LOPRESSOR) 50 MG tablet Take 50 mg by mouth 2 (two) times daily.   Multiple Vitamin (MULTIVITAMIN) tablet Take 1 tablet by mouth daily.   Multiple Vitamins-Minerals (EYE VITAMINS & MINERALS) TABS Take by mouth.   potassium chloride SA (K-DUR,KLOR-CON) 20 MEQ tablet Take 20 mEq by mouth 3 (three) times a week. Mon, Wed, Fri   No current facility-administered medications for this visit. (Other)   REVIEW OF SYSTEMS: ROS   Positive for: Cardiovascular, Eyes Negative for: Constitutional, Gastrointestinal, Neurological, Skin, Genitourinary, Musculoskeletal, HENT, Endocrine, Respiratory, Psychiatric, Allergic/Imm, Heme/Lymph Last edited by Matthew Folks, COA on 10/26/2021  2:02 PM.      ALLERGIES Allergies  Allergen Reactions   Codeine Nausea And Vomiting   Demerol [Meperidine] Nausea And Vomiting   Morphine And Related Nausea And Vomiting   Ciprofloxacin Rash   Sulfa Antibiotics Rash   PAST MEDICAL HISTORY Past Medical History:  Diagnosis Date   A-fib [facility])    Adenopathy    Atrial fibrillation ([place])    CLL (chronic lymphocytic leukemia) ([place]) 08/31/2013   HTN (hypertension)    Hyperlipidemia    Hypertensive retinopathy    Lymphocytosis    Macular degeneration    OU   Past Surgical History:  Procedure Laterality Date   ABDOMINAL HYSTERECTOMY  endometriosis, fibroid   APPENDECTOMY     BREAST BIOPSY     benign cyst.   CATARACT EXTRACTION Bilateral 2019   CHOLECYSTECTOMY     COLONOSCOPY  11/2012   Per Dr. Cristina Gong.  neg.    EYE SURGERY     IR FLUORO GUIDE PORT INSERTION RIGHT  08/12/2017   IR REMOVAL TUN ACCESS W/ PORT W/O FL MOD SED  01/29/2018   IR US GUIDE VASC ACCESS RIGHT  08/12/2017   SQUAMOUS CELL CARCINOMA EXCISION  12/28/2020   Excised from top of head   THUMB ARTHROSCOPY     TOTAL HIP ARTHROPLASTY  2010   FAMILY HISTORY Family History  Problem Relation Age of Onset   Renal Disease Mother    Cancer Mother         colon cancer   Cancer Father        colon cancer   SOCIAL HISTORY Social History   Tobacco Use   Smoking status: Never   Smokeless tobacco: Never  Substance Use Topics   Alcohol use: No   Drug use: No       OPHTHALMIC EXAM: Base Eye Exam     Visual Acuity (Snellen - Linear)       Right Left   Dist cc 20/25 -2 20/20 -   Dist ph cc NI     Correction: Glasses         Tonometry (Tonopen, 2:06 PM)       Right Left   Pressure 15 13         Pupils       Dark Light Shape React APD   Right 3 2 Round Brisk None   Left 3 2 Round Brisk None         Visual Fields (Counting fingers)       Left Right    Full Full         Extraocular Movement       Right Left    Full Full         Neuro/Psych     Oriented x3: Yes   Mood/Affect: Normal         Dilation     Both eyes: 1.0% Mydriacyl, 2.5% Phenylephrine @ 2:06 PM           Slit Lamp and Fundus Exam     Slit Lamp Exam       Right Left   Lids/Lashes Dermatochalasis - upper lid, mild Meibomian gland dysfunction Dermatochalasis - upper lid, mild Meibomian gland dysfunction   Conjunctiva/Sclera White and quiet White and quiet   Cornea Trace Debris in tear film, well healed temporal cataract wounds, 1+ pigmented [place] well healed temporal cataract wounds, 1+ Punctate epithelial erosions   Anterior Chamber Deep and quiet Deep and quiet   Iris Round and dilated Round and dilated   Lens PC IOL in good position PC IOL in good position   Anterior Vitreous Trace Vitreous syneresis, Posterior vitreous detachment Trace Vitreous syneresis, Posterior vitreous detachment         Fundus Exam       Right Left   Disc Compact, Pallor, Sharp rim, mild PPA Compact, Pink and Sharp, temporal PPP   C/D Ratio 0.1 0.2   Macula Flat, Blunted foveal reflex, Drusen, RPE mottling, clumping and early atrophy, +PED's, No heme or edema Blunted foveal reflex, +CNV, Drusen, RPE mottling and clumping; Persistent shallow  SRF overlying PED--slightly increased, no heme   Vessels attenuated, Tortuous attenuated,  mild tortuousity   Periphery Attached; mild reticular degeneration, no heme Attached, reticular degeneration, No heme            IMAGING AND PROCEDURES  Imaging and Procedures for 10/26/2021  OCT, Retina - OU - Both Eyes       Right Eye Quality was good. Central Foveal Thickness: 309. Progression has improved. Findings include no IRF, no SRF, retinal drusen , pigment epithelial detachment, outer retinal atrophy, normal foveal contour.   Left Eye Quality was good. Central Foveal Thickness: 291. Progression has worsened. Findings include abnormal foveal contour, subretinal hyper-reflective material, no IRF, pigment epithelial detachment, choroidal neovascular membrane, outer retinal atrophy, retinal drusen , subretinal fluid (Interval increase in SRF overlying stable PED).   Notes *Images captured and stored on drive  Diagnosis / Impression:  OD: non-exu ARMD -- mild interval decrease in central PEDs OS: exu ARMD -- interval increase in SRF overlying stable PED  Clinical management:  See below  Abbreviations: NFP - Normal foveal profile. CME - cystoid macular edema. PED - pigment epithelial detachment. IRF - intraretinal fluid. SRF - subretinal fluid. EZ - ellipsoid zone. ERM - epiretinal membrane. ORA - outer retinal atrophy. ORT - outer retinal tubulation. SRHM - subretinal hyper-reflective material. IRHM - intraretinal hyper-reflective material      Intravitreal Injection, Pharmacologic Agent - OS - Left Eye       Time Out 10/26/2021. 3:10 PM. Confirmed correct patient, procedure, site, and patient consented.   Anesthesia Topical anesthesia was used. Anesthetic medications included Lidocaine 2%, Proparacaine 0.5%.   Procedure Preparation included 5% betadine to ocular surface, eyelid speculum. A (32 g) needle was used.   Injection: 2 mg aflibercept 2 MG/0.05ML   Route:  Intravitreal, Site: Left Eye   NDC: [id number], Lot: [id number], Expiration date: 09/10/2022, Waste: 0.05 mL   Post-op Post injection exam found visual acuity of at least counting fingers. The patient tolerated the procedure well. There were no complications. The patient received written and verbal post procedure care education. Post injection medications were not given.            ASSESSMENT/PLAN:    ICD-10-CM   1. Exudative age-related macular degeneration of left eye with active choroidal neovascularization (HCC)  H35.3221 OCT, Retina - OU - Both Eyes    Intravitreal Injection, Pharmacologic Agent - OS - Left Eye    aflibercept (EYLEA) SOLN 2 mg    2. Intermediate stage nonexudative age-related macular degeneration of both eyes  H35.3132     3. Essential hypertension  I10     4. Hypertensive retinopathy of both eyes  H35.033     5. Pseudophakia, both eyes  Z96.1      1. Exudative age related macular degeneration, OS  - [facility] pt with known history of nonexudative ARMD OU  - acute "haze over vision" OS -- onset, Friday 11.12.21 -- initial exam with central subretinal heme  - s/p IVA OS #1 (11.17.21), #2 (12.15.21), #3 (01.12.22), #4 (02.09.22), #5 (03.16.22), #6 (04.20.22), #7 (06.01.22), #8 (06.29.22)--IVA resistance  - s/p IVE OS #1 (07.27.22), #2 (08.24.22), #3 (09.21.22), #4 (10.19.22), #5 (11.16.22)  - BCVA 20/20-  - exam and OCT shows interval increase in SRF overlying stable PED at 4 wks  - recommend IVE OS #6 (12.16.22) - pt wishes to be treated with IVE OS - RBA of procedure discussed, questions answered - informed consent obtained and signed 07.27.22 - see procedure  - f/u in 4 wks -- DFE/OCT, possible  injection  2. Age related macular degeneration, non-exudative OD  - OCT shows interval decrease in central PEDs  - The incidence, anatomy, and pathology of dry AMD, risk of progression, and the AREDS and AREDS 2 study including smoking risks discussed  with patient.   - recommend Amsler grid monitoring  3,4. Hypertensive retinopathy OU - discussed importance of tight BP control - monitor   5. Pseudophakia OU  - s/p CE/IOL OU  - IOLs in good position, doing well  - monitor   Ophthalmic Meds Ordered this visit:  Meds ordered this encounter  Medications   aflibercept (EYLEA) SOLN 2 mg     Return in about 4 weeks (around 11/23/2021) for DFE, OCT.  There are no Patient Instructions on file for this visit.  This document serves as a record of services personally performed by Gardiner Sleeper, MD, PhD. It was created on their behalf by Roselee Nova, COMT. The creation of this record is the provider's dictation and/or activities during the visit.  Electronically signed by: Roselee Nova, COMT 10/26/21 3:50 PM  Gardiner Sleeper, M.D., Ph.D. Diseases & Surgery of the Retina and [place] 10/26/2021  I have reviewed the above documentation for accuracy and completeness, and I agree with the above. Gardiner Sleeper, M.D., Ph.D. 10/26/21 3:50 PM   Abbreviations: M myopia (nearsighted); A astigmatism; H hyperopia (farsighted); P presbyopia; Mrx spectacle prescription;  CTL contact lenses; OD right eye; OS left eye; OU both eyes  XT exotropia; ET esotropia; PEK punctate epithelial keratitis; PEE punctate epithelial erosions; DES dry eye syndrome; MGD meibomian gland dysfunction; ATs artificial tears; PFAT's preservative free artificial tears; [place] nuclear sclerotic cataract; PSC posterior subcapsular cataract; ERM epi-retinal membrane; PVD posterior vitreous detachment; RD retinal detachment; DM diabetes mellitus; DR diabetic retinopathy; NPDR non-proliferative diabetic retinopathy; PDR proliferative diabetic retinopathy; CSME clinically significant macular edema; DME diabetic macular edema; dbh dot blot hemorrhages; CWS cotton wool spot; POAG primary open angle glaucoma; C/D cup-to-disc ratio; HVF humphrey visual  field; GVF goldmann visual field; OCT optical coherence tomography; IOP intraocular pressure; BRVO Branch retinal vein occlusion; CRVO central retinal vein occlusion; CRAO central retinal artery occlusion; BRAO branch retinal artery occlusion; RT retinal tear; SB scleral buckle; PPV pars plana vitrectomy; VH Vitreous hemorrhage; PRP panretinal laser photocoagulation; IVK intravitreal kenalog; VMT vitreomacular traction; MH Macular hole;  NVD neovascularization of the disc; NVE neovascularization elsewhere; AREDS age related eye disease study; ARMD age related macular degeneration; POAG primary open angle glaucoma; EBMD epithelial/anterior basement membrane dystrophy; ACIOL anterior chamber intraocular lens; IOL intraocular lens; PCIOL posterior chamber intraocular lens; Phaco/IOL phacoemulsification with intraocular lens placement; Page photorefractive keratectomy; LASIK laser assisted in situ keratomileusis; HTN hypertension; DM diabetes mellitus; COPD chronic obstructive pulmonary disease

## 2021-10-26 ENCOUNTER — Ambulatory Visit (INDEPENDENT_AMBULATORY_CARE_PROVIDER_SITE_OTHER): Payer: Medicare Other | Admitting: Ophthalmology

## 2021-10-26 ENCOUNTER — Encounter (INDEPENDENT_AMBULATORY_CARE_PROVIDER_SITE_OTHER): Payer: Self-pay | Admitting: Ophthalmology

## 2021-10-26 ENCOUNTER — Other Ambulatory Visit: Payer: Self-pay

## 2021-10-26 DIAGNOSIS — Z961 Presence of intraocular lens: Secondary | ICD-10-CM

## 2021-10-26 DIAGNOSIS — H353221 Exudative age-related macular degeneration, left eye, with active choroidal neovascularization: Secondary | ICD-10-CM | POA: Diagnosis not present

## 2021-10-26 DIAGNOSIS — H35033 Hypertensive retinopathy, bilateral: Secondary | ICD-10-CM | POA: Diagnosis not present

## 2021-10-26 DIAGNOSIS — I10 Essential (primary) hypertension: Secondary | ICD-10-CM

## 2021-10-26 DIAGNOSIS — H353132 Nonexudative age-related macular degeneration, bilateral, intermediate dry stage: Secondary | ICD-10-CM | POA: Diagnosis not present

## 2021-10-26 MED ORDER — AFLIBERCEPT 2MG/0.05ML IZ SOLN FOR KALEIDOSCOPE
2.0000 mg | INTRAVITREAL | Status: AC | PRN
  Administered 2021-10-26: 2 mg via INTRAVITREAL

## 2021-10-30 DIAGNOSIS — U071 COVID-19: Secondary | ICD-10-CM | POA: Diagnosis not present

## 2021-11-26 NOTE — Progress Notes (Signed)
[facility] Note  11/28/2021     CHIEF COMPLAINT Patient presents for Retina Follow Up  HISTORY OF PRESENT ILLNESS: Amanda [name] is a 76 y.o. female who presents to the clinic today for:  HPI     Retina Follow Up   Patient presents with  Wet AMD.  In left eye.  Severity is moderate.  Duration of 4.5 weeks.  Since onset it is stable.  I, the attending physician,  performed the HPI with the patient and updated documentation appropriately.        Comments   Patient states vision the same OU.      Last edited by Bernarda Caffey, MD on 11/28/2021  4:37 PM.    Patient states she had noticed a change in OD vision.    Referring physician: Shirleen Schirmer, PA-C [facility] [address] [place],  [place] [address]  HISTORICAL INFORMATION:  Selected notes from the MEDICAL RECORD NUMBER Referred by Shirleen Schirmer, PA on 11.16.21 for eval of dry to wet AMD conversion OS.   CURRENT MEDICATIONS: No current outpatient medications on file. (Ophthalmic Drugs)   No current facility-administered medications for this visit. (Ophthalmic Drugs)   Current Outpatient Medications (Other)  Medication Sig   acetaminophen (TYLENOL) 500 MG tablet Take 500 mg by mouth every 6 (six) hours as needed for pain.   amLODipine (NORVASC) 5 MG tablet Take 5 mg by mouth daily.   atorvastatin (LIPITOR) 40 MG tablet Take 1 tablet by mouth daily.   Calcium Carb-Cholecalciferol (CALCIUM + D3) 600-200 MG-UNIT TABS Take 1 tablet by mouth 3 (three) times a week.    fluticasone (FLONASE) 50 MCG/ACT nasal spray Place 2 sprays into the nose daily.   folic acid (FOLVITE) 1 MG tablet Take 1 mg by mouth daily.   hydrochlorothiazide (HYDRODIURIL) 12.5 MG tablet Take 12.5 mg by mouth daily.   ibandronate (BONIVA) 150 MG tablet Take 150 mg by mouth every 30 (thirty) days.   metoprolol (LOPRESSOR) 50 MG tablet Take 50 mg by mouth 2 (two) times daily.   Multiple Vitamin  (MULTIVITAMIN) tablet Take 1 tablet by mouth daily.   Multiple Vitamins-Minerals (EYE VITAMINS & MINERALS) TABS Take by mouth.   potassium chloride SA (K-DUR,KLOR-CON) 20 MEQ tablet Take 20 mEq by mouth 3 (three) times a week. Mon, Wed, Fri   atorvastatin (LIPITOR) 40 MG tablet Take 40 mg by mouth daily. (Patient not taking: Reported on 11/28/2021)   No current facility-administered medications for this visit. (Other)   REVIEW OF SYSTEMS: ROS   Positive for: Cardiovascular, Eyes Negative for: Constitutional, Gastrointestinal, Neurological, Skin, Genitourinary, Musculoskeletal, HENT, Endocrine, Respiratory, Psychiatric, Allergic/Imm, Heme/Lymph Last edited by Jobe Marker, COT on 11/28/2021  2:12 PM.     ALLERGIES Allergies  Allergen Reactions   Codeine Nausea And Vomiting   Demerol [Meperidine] Nausea And Vomiting   Morphine And Related Nausea And Vomiting   Ciprofloxacin Rash   Sulfa Antibiotics Rash   PAST MEDICAL HISTORY Past Medical History:  Diagnosis Date   A-fib [facility])    Adenopathy    Atrial fibrillation ([place])    CLL (chronic lymphocytic leukemia) ([place]) 08/31/2013   HTN (hypertension)    Hyperlipidemia    Hypertensive retinopathy    Lymphocytosis    Macular degeneration    OU   Past Surgical History:  Procedure Laterality Date   ABDOMINAL HYSTERECTOMY     endometriosis, fibroid   APPENDECTOMY  BREAST BIOPSY     benign cyst.   CATARACT EXTRACTION Bilateral 2019   CHOLECYSTECTOMY     COLONOSCOPY  11/2012   Per Dr. Cristina Gong.  neg.    EYE SURGERY     IR FLUORO GUIDE PORT INSERTION RIGHT  08/12/2017   IR REMOVAL TUN ACCESS W/ PORT W/O FL MOD SED  01/29/2018   IR US GUIDE VASC ACCESS RIGHT  08/12/2017   SQUAMOUS CELL CARCINOMA EXCISION  12/28/2020   Excised from top of head   THUMB ARTHROSCOPY     TOTAL HIP ARTHROPLASTY  2010   FAMILY HISTORY Family History  Problem Relation Age of Onset   Renal Disease Mother    Cancer Mother        colon cancer    Cancer Father        colon cancer   SOCIAL HISTORY Social History   Tobacco Use   Smoking status: Never   Smokeless tobacco: Never  Substance Use Topics   Alcohol use: No   Drug use: No       OPHTHALMIC EXAM: Base Eye Exam     Visual Acuity (Snellen - Linear)       Right Left   Dist cc 20/40 20/20 -2   Dist ph cc 20/30 -2     Correction: Glasses         Tonometry (Tonopen, 2:16 PM)       Right Left   Pressure 17 14         Pupils       Dark Light Shape React APD   Right 3 2 Round Brisk None   Left 3 2 Round Brisk None         Visual Fields (Counting fingers)       Left Right    Full Full         Extraocular Movement       Right Left    Full, Ortho Full, Ortho         Neuro/Psych     Oriented x3: Yes   Mood/Affect: Normal         Dilation     Both eyes: 1.0% Mydriacyl, 2.5% Phenylephrine @ 2:16 PM           Slit Lamp and Fundus Exam     Slit Lamp Exam       Right Left   Lids/Lashes Dermatochalasis - upper lid, mild Meibomian gland dysfunction Dermatochalasis - upper lid, mild Meibomian gland dysfunction   Conjunctiva/Sclera White and quiet White and quiet   Cornea Trace Debris in tear film, well healed temporal cataract wounds, 1+ pigmented [place] well healed temporal cataract wounds, 1+ Punctate epithelial erosions   Anterior Chamber Deep and quiet Deep and quiet   Iris Round and dilated Round and dilated   Lens PC IOL in good position PC IOL in good position   Anterior Vitreous Trace Vitreous syneresis, Posterior vitreous detachment Trace Vitreous syneresis, Posterior vitreous detachment         Fundus Exam       Right Left   Disc Compact, Pallor, Sharp rim, mild PPA Compact, Pink and Sharp, temporal PPP   C/D Ratio 0.1 0.2   Macula Flat, Blunted foveal reflex, Drusen, RPE mottling, clumping and early atrophy, +PED's shallowing, No heme or edema Blunted foveal reflex, +CNV, Drusen, RPE mottling and clumping;  Persistent shallow SRF overlying PED--slightly increased, no heme   Vessels attenuated, Tortuous attenuated, mild tortuousity   Periphery Attached;  mild reticular degeneration, no heme Attached, reticular degeneration, No heme           Refraction     Wearing Rx       Sphere Cylinder Axis Add   Right -0.25 +0.75 165 +2.50   Left -0.50 +0.50 012 +2.50    Type: PAL           IMAGING AND PROCEDURES  Imaging and Procedures for 11/28/2021  OCT, Retina - OU - Both Eyes       Right Eye Quality was good. Central Foveal Thickness: 274. Progression has improved. Findings include no IRF, no SRF, retinal drusen , pigment epithelial detachment, outer retinal atrophy, normal foveal contour (mild interval decrease in central PEDs).   Left Eye Quality was good. Central Foveal Thickness: 281. Progression has worsened. Findings include abnormal foveal contour, subretinal hyper-reflective material, no IRF, pigment epithelial detachment, choroidal neovascular membrane, outer retinal atrophy, retinal drusen , subretinal fluid (Mild increase in SRF overlying stable PED).   Notes *Images captured and stored on drive  Diagnosis / Impression:  OD: non-exu ARMD -- mild interval decrease in central PEDs OS: exu ARMD -- mild increase in SRF overlying stable PED  Clinical management:  See below  Abbreviations: NFP - Normal foveal profile. CME - cystoid macular edema. PED - pigment epithelial detachment. IRF - intraretinal fluid. SRF - subretinal fluid. EZ - ellipsoid zone. ERM - epiretinal membrane. ORA - outer retinal atrophy. ORT - outer retinal tubulation. SRHM - subretinal hyper-reflective material. IRHM - intraretinal hyper-reflective material      Intravitreal Injection, Pharmacologic Agent - OS - Left Eye       Time Out 11/28/2021. 2:43 PM. Confirmed correct patient, procedure, site, and patient consented.   Anesthesia Topical anesthesia was used. Anesthetic medications included  Lidocaine 2%, Proparacaine 0.5%.   Procedure Preparation included 5% betadine to ocular surface, eyelid speculum.   Injection: 2 mg aflibercept 2 MG/0.05ML   Route: Intravitreal, Site: Left Eye   NDC: A3590391, Lot: [id number], Expiration date: 10/10/2022, Waste: 0.05 mL   Post-op Post injection exam found visual acuity of at least counting fingers. The patient tolerated the procedure well. There were no complications. The patient received written and verbal post procedure care education. Post injection medications were not given.            ASSESSMENT/PLAN:    ICD-10-CM   1. Exudative age-related macular degeneration of left eye with active choroidal neovascularization (HCC)  H35.3221 OCT, Retina - OU - Both Eyes    Intravitreal Injection, Pharmacologic Agent - OS - Left Eye    aflibercept (EYLEA) SOLN 2 mg    2. Intermediate stage nonexudative age-related macular degeneration of both eyes  H35.3132     3. Essential hypertension  I10     4. Hypertensive retinopathy of both eyes  H35.033     5. Pseudophakia, both eyes  Z96.1       1. Exudative age related macular degeneration, OS  - [facility] pt with known history of nonexudative ARMD OU  - acute "haze over vision" OS -- onset, Friday 11.12.21 -- initial exam with central subretinal heme  - s/p IVA OS #1 (11.17.21), #2 (12.15.21), #3 (01.12.22), #4 (02.09.22), #5 (03.16.22), #6 (04.20.22), #7 (06.01.22), #8 (06.29.22)--IVA resistance  - s/p IVE OS #1 (07.27.22), #2 (08.24.22), #3 (09.21.22), #4 (10.19.22), #5 (11.16.22), #6 (12.16.22)  - BCVA 20/20-  - exam and OCT shows mild increase in SRF overlying stable PED at 4 wks  -  discussed possible IVE resistance; possible Vabysmo candidate   - recommend IVE OS #7 (01.18.23) - pt wishes to be treated with IVE OS - RBA of procedure discussed, questions answered - informed consent obtained and signed 07.27.22 - see procedure  - f/u in 4 wks -- DFE/OCT, possible  injection  2. Age related macular degeneration, non-exudative OD  - OCT shows interval decrease in central PEDs  - The incidence, anatomy, and pathology of dry AMD, risk of progression, and the AREDS and AREDS 2 study including smoking risks discussed with patient.   - recommend [place] grid monitoring  3,4. Hypertensive retinopathy OU - discussed importance of tight BP control - monitor   5. Pseudophakia OU  - s/p CE/IOL OU  - IOLs in good position, doing well  - monitor   Ophthalmic Meds Ordered this visit:  Meds ordered this encounter  Medications   aflibercept (EYLEA) SOLN 2 mg     Return in about 4 weeks (around 12/26/2021) for DFE, OCT, possible injection.  There are no Patient Instructions on file for this visit.  This document serves as a record of services personally performed by Gardiner Sleeper, MD, PhD. It was created on their behalf by Leonie Douglas, an ophthalmic technician. The creation of this record is the provider's dictation and/or activities during the visit.    Electronically signed by: Leonie Douglas COA, 11/28/21  4:41 PM   This document serves as a record of services personally performed by Gardiner Sleeper, MD, PhD. It was created on their behalf by San Jetty. Owens Shark, OA an ophthalmic technician. The creation of this record is the provider's dictation and/or activities during the visit.    Electronically signed by: San Jetty. Owens Shark, OA @TODAY @ 4:41 PM  Gardiner Sleeper, M.D., Ph.D. Diseases & Surgery of the Retina and Vitreous [facility]  I have reviewed the above documentation for accuracy and completeness, and I agree with the above. Gardiner Sleeper, M.D., Ph.D. 11/28/21 4:41 PM  Abbreviations: M myopia (nearsighted); A astigmatism; H hyperopia (farsighted); P presbyopia; Mrx spectacle prescription;  CTL contact lenses; OD right eye; OS left eye; OU both eyes  XT exotropia; ET esotropia; PEK punctate epithelial keratitis; PEE punctate  epithelial erosions; DES dry eye syndrome; MGD meibomian gland dysfunction; ATs artificial tears; PFAT's preservative free artificial tears; [place] nuclear sclerotic cataract; PSC posterior subcapsular cataract; ERM epi-retinal membrane; PVD posterior vitreous detachment; RD retinal detachment; DM diabetes mellitus; DR diabetic retinopathy; NPDR non-proliferative diabetic retinopathy; PDR proliferative diabetic retinopathy; CSME clinically significant macular edema; DME diabetic macular edema; dbh dot blot hemorrhages; CWS cotton wool spot; POAG primary open angle glaucoma; C/D cup-to-disc ratio; HVF humphrey visual field; GVF goldmann visual field; OCT optical coherence tomography; IOP intraocular pressure; BRVO Branch retinal vein occlusion; CRVO central retinal vein occlusion; CRAO central retinal artery occlusion; BRAO branch retinal artery occlusion; RT retinal tear; SB scleral buckle; PPV pars plana vitrectomy; VH Vitreous hemorrhage; PRP panretinal laser photocoagulation; IVK intravitreal kenalog; VMT vitreomacular traction; MH Macular hole;  NVD neovascularization of the disc; NVE neovascularization elsewhere; AREDS age related eye disease study; ARMD age related macular degeneration; POAG primary open angle glaucoma; EBMD epithelial/anterior basement membrane dystrophy; ACIOL anterior chamber intraocular lens; IOL intraocular lens; PCIOL posterior chamber intraocular lens; Phaco/IOL phacoemulsification with intraocular lens placement; [place] photorefractive keratectomy; LASIK laser assisted in situ keratomileusis; HTN hypertension; DM diabetes mellitus; COPD chronic obstructive pulmonary disease

## 2021-11-28 ENCOUNTER — Ambulatory Visit (INDEPENDENT_AMBULATORY_CARE_PROVIDER_SITE_OTHER): Payer: Medicare Other | Admitting: Ophthalmology

## 2021-11-28 ENCOUNTER — Other Ambulatory Visit: Payer: Self-pay

## 2021-11-28 ENCOUNTER — Encounter (INDEPENDENT_AMBULATORY_CARE_PROVIDER_SITE_OTHER): Payer: Self-pay | Admitting: Ophthalmology

## 2021-11-28 DIAGNOSIS — Z961 Presence of intraocular lens: Secondary | ICD-10-CM

## 2021-11-28 DIAGNOSIS — H353221 Exudative age-related macular degeneration, left eye, with active choroidal neovascularization: Secondary | ICD-10-CM

## 2021-11-28 DIAGNOSIS — H35033 Hypertensive retinopathy, bilateral: Secondary | ICD-10-CM | POA: Diagnosis not present

## 2021-11-28 DIAGNOSIS — I10 Essential (primary) hypertension: Secondary | ICD-10-CM

## 2021-11-28 DIAGNOSIS — H353132 Nonexudative age-related macular degeneration, bilateral, intermediate dry stage: Secondary | ICD-10-CM | POA: Diagnosis not present

## 2021-11-28 MED ORDER — AFLIBERCEPT 2MG/0.05ML IZ SOLN FOR KALEIDOSCOPE
2.0000 mg | INTRAVITREAL | Status: AC | PRN
  Administered 2021-11-28: 2 mg via INTRAVITREAL

## 2021-12-06 ENCOUNTER — Other Ambulatory Visit: Payer: Self-pay | Admitting: Family Medicine

## 2021-12-06 DIAGNOSIS — Z1231 Encounter for screening mammogram for malignant neoplasm of breast: Secondary | ICD-10-CM

## 2021-12-18 DIAGNOSIS — R300 Dysuria: Secondary | ICD-10-CM | POA: Diagnosis not present

## 2021-12-18 DIAGNOSIS — U071 COVID-19: Secondary | ICD-10-CM | POA: Diagnosis not present

## 2021-12-18 DIAGNOSIS — N390 Urinary tract infection, site not specified: Secondary | ICD-10-CM | POA: Diagnosis not present

## 2021-12-18 DIAGNOSIS — N3000 Acute cystitis without hematuria: Secondary | ICD-10-CM | POA: Diagnosis not present

## 2021-12-21 NOTE — Progress Notes (Signed)
[facility] Note  12/26/2021     CHIEF COMPLAINT Patient presents for Retina Follow Up  HISTORY OF PRESENT ILLNESS: Amanda [name] is a 76 y.o. female who presents to the clinic today for:  HPI     Retina Follow Up   Patient presents with  Wet AMD.  In left eye.  This started 4 weeks ago.  I, the attending physician,  performed the HPI with the patient and updated documentation appropriately.        Comments   Patient here for 4 weeks retina follow up for exu ARMD OS. Patient states vision OD has gotten worse. It reminds of before has cataract surgery. Like a haze. Gradually worse. No eye pain. OS is good.       Last edited by Bernarda Caffey, MD on 12/27/2021  4:27 PM.    Patient states vision OS seems good. However, vision OD seems worse.   Referring physician: Shirleen Schirmer, PA-C [facility] [address] [place],  [place] [address]  HISTORICAL INFORMATION:  Selected notes from the MEDICAL RECORD NUMBER Referred by Shirleen Schirmer, PA on 11.16.21 for eval of dry to wet AMD conversion OS.   CURRENT MEDICATIONS: No current outpatient medications on file. (Ophthalmic Drugs)   No current facility-administered medications for this visit. (Ophthalmic Drugs)   Current Outpatient Medications (Other)  Medication Sig   acetaminophen (TYLENOL) 500 MG tablet Take 500 mg by mouth every 6 (six) hours as needed for pain.   amLODipine (NORVASC) 5 MG tablet Take 5 mg by mouth daily.   atorvastatin (LIPITOR) 40 MG tablet Take 1 tablet by mouth daily.   Calcium Carb-Cholecalciferol (CALCIUM + D3) 600-200 MG-UNIT TABS Take 1 tablet by mouth 3 (three) times a week.    fluticasone (FLONASE) 50 MCG/ACT nasal spray Place 2 sprays into the nose daily.   folic acid (FOLVITE) 1 MG tablet Take 1 mg by mouth daily.   hydrochlorothiazide (HYDRODIURIL) 12.5 MG tablet Take 12.5 mg by mouth daily.   ibandronate (BONIVA) 150 MG tablet Take 150 mg  by mouth every 30 (thirty) days.   metoprolol (LOPRESSOR) 50 MG tablet Take 50 mg by mouth 2 (two) times daily.   Multiple Vitamin (MULTIVITAMIN) tablet Take 1 tablet by mouth daily.   Multiple Vitamins-Minerals (EYE VITAMINS & MINERALS) TABS Take by mouth.   potassium chloride SA (K-DUR,KLOR-CON) 20 MEQ tablet Take 20 mEq by mouth 3 (three) times a week. Mon, Wed, Fri   atorvastatin (LIPITOR) 40 MG tablet Take 40 mg by mouth daily. (Patient not taking: Reported on 11/28/2021)   No current facility-administered medications for this visit. (Other)   REVIEW OF SYSTEMS: ROS   Positive for: Cardiovascular, Eyes Negative for: Constitutional, Gastrointestinal, Neurological, Skin, Genitourinary, Musculoskeletal, HENT, Endocrine, Respiratory, Psychiatric, Allergic/Imm, Heme/Lymph Last edited by Theodore Demark, COA on 12/26/2021  2:04 PM.     ALLERGIES Allergies  Allergen Reactions   Codeine Nausea And Vomiting   Demerol [Meperidine] Nausea And Vomiting   Morphine And Related Nausea And Vomiting   Ciprofloxacin Rash   Sulfa Antibiotics Rash   PAST MEDICAL HISTORY Past Medical History:  Diagnosis Date   A-fib ([place])    Adenopathy    Atrial fibrillation ([place])    CLL (chronic lymphocytic leukemia) ([place]) 08/31/2013   HTN (hypertension)    Hyperlipidemia    Hypertensive retinopathy    Lymphocytosis    Macular degeneration    OU  Past Surgical History:  Procedure Laterality Date   ABDOMINAL HYSTERECTOMY     endometriosis, fibroid   APPENDECTOMY     BREAST BIOPSY     benign cyst.   CATARACT EXTRACTION Bilateral 2019   CHOLECYSTECTOMY     COLONOSCOPY  11/2012   Per Dr. Cristina Gong.  neg.    EYE SURGERY     IR FLUORO GUIDE PORT INSERTION RIGHT  08/12/2017   IR REMOVAL TUN ACCESS W/ PORT W/O FL MOD SED  01/29/2018   IR US GUIDE VASC ACCESS RIGHT  08/12/2017   SQUAMOUS CELL CARCINOMA EXCISION  12/28/2020   Excised from top of head   THUMB ARTHROSCOPY     TOTAL HIP ARTHROPLASTY  2010    FAMILY HISTORY Family History  Problem Relation Age of Onset   Renal Disease Mother    Cancer Mother        colon cancer   Cancer Father        colon cancer   SOCIAL HISTORY Social History   Tobacco Use   Smoking status: Never   Smokeless tobacco: Never  Vaping Use   Vaping Use: Never used  Substance Use Topics   Alcohol use: No   Drug use: No       OPHTHALMIC EXAM: Base Eye Exam     Visual Acuity (Snellen - Linear)       Right Left   Dist cc 20/30 -2 20/25   Dist ph cc 20/30 +3 20/25 +1    Correction: Glasses         Tonometry (Tonopen, 2:00 PM)       Right Left   Pressure 13 12         Pupils       Dark Light Shape React APD   Right 3 2 Round Brisk None   Left 3 2 Round Brisk None         Visual Fields (Counting fingers)       Left Right    Full Full         Extraocular Movement       Right Left    Full, Ortho Full, Ortho         Neuro/Psych     Oriented x3: Yes   Mood/Affect: Normal         Dilation     Both eyes: 1.0% Mydriacyl, 2.5% Phenylephrine @ 2:00 PM           Slit Lamp and Fundus Exam     Slit Lamp Exam       Right Left   Lids/Lashes Dermatochalasis - upper lid, mild Meibomian gland dysfunction Dermatochalasis - upper lid, mild Meibomian gland dysfunction   Conjunctiva/Sclera White and quiet White and quiet   Cornea Trace Debris in tear film, well healed temporal cataract wounds, 1+ pigmented Guttata, trace PEE well healed temporal cataract wounds, 2+ Punctate epithelial erosions   Anterior Chamber Deep and quiet Deep and quiet   Iris Round and dilated Round and dilated   Lens PC IOL in good position, trace PCO PC IOL in good position   Anterior Vitreous Trace Vitreous syneresis, Posterior vitreous detachment Trace Vitreous syneresis, Posterior vitreous detachment         Fundus Exam       Right Left   Disc Compact, Pallor, Sharp rim, mild PPA Compact, Pink and Sharp, temporal PPP   C/D Ratio  0.1 0.2   Macula Flat, Blunted foveal reflex, Drusen, RPE mottling, clumping  and early atrophy, +PED's shallowing, No heme or edema, mild increase in clumping Blunted foveal reflex, +CNV, Drusen, RPE mottling and clumping; Persistent shallow SRF overlying PED--slightly improved, no heme   Vessels attenuated, Tortuous attenuated, mild tortuousity   Periphery Attached; mild reticular degeneration, no heme Attached, reticular degeneration, No heme           Refraction     Wearing Rx       Sphere Cylinder Axis Add   Right -0.25 +0.75 165 +2.50   Left -0.50 +0.50 012 +2.50    Type: PAL           IMAGING AND PROCEDURES  Imaging and Procedures for 12/26/2021  OCT, Retina - OU - Both Eyes       Right Eye Quality was good. Central Foveal Thickness: 259. Progression has improved. Findings include no IRF, no SRF, retinal drusen , pigment epithelial detachment, outer retinal atrophy, normal foveal contour (mild interval decrease in central PEDs, ? Mild progression of ORA).   Left Eye Quality was good. Central Foveal Thickness: 268. Progression has improved. Findings include abnormal foveal contour, subretinal hyper-reflective material, no IRF, pigment epithelial detachment, choroidal neovascular membrane, outer retinal atrophy, retinal drusen , subretinal fluid (Mild decrease in SRF overlying stable PED).   Notes *Images captured and stored on drive  Diagnosis / Impression:  OD: non-exu ARMD -- mild interval decrease in central PEDs, ? Mild progression of ORA OS: exu ARMD -- mild decrease in SRF overlying stable PED  Clinical management:  See below  Abbreviations: NFP - Normal foveal profile. CME - cystoid macular edema. PED - pigment epithelial detachment. IRF - intraretinal fluid. SRF - subretinal fluid. EZ - ellipsoid zone. ERM - epiretinal membrane. ORA - outer retinal atrophy. ORT - outer retinal tubulation. SRHM - subretinal hyper-reflective material. IRHM - intraretinal  hyper-reflective material      Intravitreal Injection, Pharmacologic Agent - OS - Left Eye       Time Out 12/26/2021. 2:42 PM. Confirmed correct patient, procedure, site, and patient consented.   Anesthesia Topical anesthesia was used. Anesthetic medications included Lidocaine 2%, Proparacaine 0.5%.   Procedure Preparation included 5% betadine to ocular surface, eyelid speculum.   Injection: 2 mg aflibercept 2 MG/0.05ML   Route: Intravitreal, Site: Left Eye   NDC: [id number], Lot: [id number], Expiration date: 10/11/2022, Waste: 0.05 mL   Post-op Post injection exam found visual acuity of at least counting fingers. The patient tolerated the procedure well. There were no complications. The patient received written and verbal post procedure care education. Post injection medications were not given.            ASSESSMENT/PLAN:    ICD-10-CM   1. Exudative age-related macular degeneration of left eye with active choroidal neovascularization (HCC)  H35.3221 OCT, Retina - OU - Both Eyes    Intravitreal Injection, Pharmacologic Agent - OS - Left Eye    aflibercept (EYLEA) SOLN 2 mg    2. Intermediate stage nonexudative age-related macular degeneration of both eyes  H35.3132     3. Essential hypertension  I10     4. Hypertensive retinopathy of both eyes  H35.033     5. Pseudophakia, both eyes  Z96.1      1. Exudative age related macular degeneration, OS  - [facility] pt with known history of nonexudative ARMD OU  - acute "haze over vision" OS -- onset, Friday 11.12.21 -- initial exam with central subretinal heme  - s/p IVA OS #1 (11.17.21), #2 (  12.15.21), #3 (01.12.22), #4 (02.09.22), #5 (03.16.22), #6 (04.20.22), #7 (06.01.22), #8 (06.29.22)--IVA resistance  - s/p IVE OS #1 (07.27.22), #2 (08.24.22), #3 (09.21.22), #4 (10.19.22), #5 (11.16.22), #6 (12.16.22), #7 (01.18.23)  - BCVA 20/25+1  - exam and OCT shows mild decrease in SRF overlying stable PED at 4 wks  -  discussed possible IVE resistance; possible Vabysmo candidate   - recommend IVE OS #8 (02.15.23) - pt wishes to be treated with IVE OS - RBA of procedure discussed, questions answered - informed consent obtained and signed 07.27.22 - see procedure  - f/u in 4 wks -- DFE/OCT, possible injection  2. Age related macular degeneration, non-exudative OD  - OCT shows interval decrease in central PEDs  - The incidence, anatomy, and pathology of dry AMD, risk of progression, and the AREDS and AREDS 2 study including smoking risks discussed with patient.   - recommended Amsler grid monitoring  3,4. Hypertensive retinopathy OU - discussed importance of tight BP control - monitor   5. Pseudophakia OU  - s/p CE/IOL OU  - IOLs in good position, doing well  - monitor   Ophthalmic Meds Ordered this visit:  Meds ordered this encounter  Medications   aflibercept (EYLEA) SOLN 2 mg     Return in about 4 weeks (around 01/23/2022) for DFE, OCT, possible injection.  There are no Patient Instructions on file for this visit. This document serves as a record of services personally performed by Gardiner Sleeper, MD, PhD. It was created on their behalf by Orvan Falconer, an ophthalmic technician. The creation of this record is the provider's dictation and/or activities during the visit.    Electronically signed by: Orvan Falconer, OA, 12/27/21  4:28 PM  This document serves as a record of services personally performed by Gardiner Sleeper, MD, PhD. It was created on their behalf by Roselee Nova, COMT. The creation of this record is the provider's dictation and/or activities during the visit.  Electronically signed by: Roselee Nova, COMT 12/27/21 4:28 PM  Gardiner Sleeper, M.D., Ph.D. Diseases & Surgery of the Retina and Vitreous [facility]  I have reviewed the above documentation for accuracy and completeness, and I agree with the above. Gardiner Sleeper, M.D., Ph.D. 12/27/21  4:29 PM  Abbreviations: M myopia (nearsighted); A astigmatism; H hyperopia (farsighted); P presbyopia; Mrx spectacle prescription;  CTL contact lenses; OD right eye; OS left eye; OU both eyes  XT exotropia; ET esotropia; PEK punctate epithelial keratitis; PEE punctate epithelial erosions; DES dry eye syndrome; MGD meibomian gland dysfunction; ATs artificial tears; PFAT's preservative free artificial tears; [place] nuclear sclerotic cataract; PSC posterior subcapsular cataract; ERM epi-retinal membrane; PVD posterior vitreous detachment; RD retinal detachment; DM diabetes mellitus; DR diabetic retinopathy; NPDR non-proliferative diabetic retinopathy; PDR proliferative diabetic retinopathy; CSME clinically significant macular edema; DME diabetic macular edema; dbh dot blot hemorrhages; CWS cotton wool spot; POAG primary open angle glaucoma; C/D cup-to-disc ratio; HVF humphrey visual field; GVF goldmann visual field; OCT optical coherence tomography; IOP intraocular pressure; BRVO Branch retinal vein occlusion; CRVO central retinal vein occlusion; CRAO central retinal artery occlusion; BRAO branch retinal artery occlusion; RT retinal tear; SB scleral buckle; PPV pars plana vitrectomy; VH Vitreous hemorrhage; PRP panretinal laser photocoagulation; IVK intravitreal kenalog; VMT vitreomacular traction; MH Macular hole;  NVD neovascularization of the disc; NVE neovascularization elsewhere; AREDS age related eye disease study; ARMD age related macular degeneration; POAG primary open angle glaucoma; EBMD epithelial/anterior basement membrane dystrophy; ACIOL anterior chamber  intraocular lens; IOL intraocular lens; PCIOL posterior chamber intraocular lens; Phaco/IOL phacoemulsification with intraocular lens placement; [place] photorefractive keratectomy; LASIK laser assisted in situ keratomileusis; HTN hypertension; DM diabetes mellitus; COPD chronic obstructive pulmonary disease

## 2021-12-25 ENCOUNTER — Ambulatory Visit: Payer: Medicare Other

## 2021-12-26 ENCOUNTER — Ambulatory Visit (INDEPENDENT_AMBULATORY_CARE_PROVIDER_SITE_OTHER): Payer: Medicare Other | Admitting: Ophthalmology

## 2021-12-26 ENCOUNTER — Other Ambulatory Visit: Payer: Self-pay

## 2021-12-26 ENCOUNTER — Encounter (INDEPENDENT_AMBULATORY_CARE_PROVIDER_SITE_OTHER): Payer: Self-pay | Admitting: Ophthalmology

## 2021-12-26 DIAGNOSIS — H35033 Hypertensive retinopathy, bilateral: Secondary | ICD-10-CM

## 2021-12-26 DIAGNOSIS — Z961 Presence of intraocular lens: Secondary | ICD-10-CM

## 2021-12-26 DIAGNOSIS — I10 Essential (primary) hypertension: Secondary | ICD-10-CM | POA: Diagnosis not present

## 2021-12-26 DIAGNOSIS — H353221 Exudative age-related macular degeneration, left eye, with active choroidal neovascularization: Secondary | ICD-10-CM | POA: Diagnosis not present

## 2021-12-26 DIAGNOSIS — H353132 Nonexudative age-related macular degeneration, bilateral, intermediate dry stage: Secondary | ICD-10-CM | POA: Diagnosis not present

## 2021-12-27 ENCOUNTER — Encounter (INDEPENDENT_AMBULATORY_CARE_PROVIDER_SITE_OTHER): Payer: Self-pay | Admitting: Ophthalmology

## 2021-12-27 MED ORDER — AFLIBERCEPT 2MG/0.05ML IZ SOLN FOR KALEIDOSCOPE
2.0000 mg | INTRAVITREAL | Status: AC | PRN
  Administered 2021-12-27: 2 mg via INTRAVITREAL

## 2022-01-01 ENCOUNTER — Ambulatory Visit
Admission: RE | Admit: 2022-01-01 | Discharge: 2022-01-01 | Disposition: A | Discharge status: Home / Self Care | Payer: Medicare Other | Source: Ambulatory Visit | Attending: Family Medicine | Admitting: Family Medicine

## 2022-01-01 DIAGNOSIS — Z1231 Encounter for screening mammogram for malignant neoplasm of breast: Secondary | ICD-10-CM

## 2022-01-02 ENCOUNTER — Other Ambulatory Visit: Payer: Self-pay | Admitting: Family Medicine

## 2022-01-02 DIAGNOSIS — R928 Other abnormal and inconclusive findings on diagnostic imaging of breast: Secondary | ICD-10-CM

## 2022-01-07 ENCOUNTER — Other Ambulatory Visit: Payer: Medicare Other

## 2022-01-08 DIAGNOSIS — M85852 Other specified disorders of bone density and structure, left thigh: Secondary | ICD-10-CM | POA: Diagnosis not present

## 2022-01-08 DIAGNOSIS — Z79899 Other long term (current) drug therapy: Secondary | ICD-10-CM | POA: Diagnosis not present

## 2022-01-08 DIAGNOSIS — I10 Essential (primary) hypertension: Secondary | ICD-10-CM | POA: Diagnosis not present

## 2022-01-08 DIAGNOSIS — E782 Mixed hyperlipidemia: Secondary | ICD-10-CM | POA: Diagnosis not present

## 2022-01-14 DIAGNOSIS — Z1389 Encounter for screening for other disorder: Secondary | ICD-10-CM | POA: Diagnosis not present

## 2022-01-14 DIAGNOSIS — E782 Mixed hyperlipidemia: Secondary | ICD-10-CM | POA: Diagnosis not present

## 2022-01-14 DIAGNOSIS — R928 Other abnormal and inconclusive findings on diagnostic imaging of breast: Secondary | ICD-10-CM | POA: Diagnosis not present

## 2022-01-14 DIAGNOSIS — Z0000 Encounter for general adult medical examination without abnormal findings: Secondary | ICD-10-CM | POA: Diagnosis not present

## 2022-01-14 DIAGNOSIS — I480 Paroxysmal atrial fibrillation: Secondary | ICD-10-CM | POA: Diagnosis not present

## 2022-01-14 DIAGNOSIS — I10 Essential (primary) hypertension: Secondary | ICD-10-CM | POA: Diagnosis not present

## 2022-01-14 DIAGNOSIS — M85852 Other specified disorders of bone density and structure, left thigh: Secondary | ICD-10-CM | POA: Diagnosis not present

## 2022-01-14 DIAGNOSIS — C9191 Lymphoid leukemia, unspecified, in remission: Secondary | ICD-10-CM | POA: Diagnosis not present

## 2022-01-14 DIAGNOSIS — H3562 Retinal hemorrhage, left eye: Secondary | ICD-10-CM | POA: Diagnosis not present

## 2022-01-15 NOTE — Progress Notes (Shared)
[facility] Note  01/23/2022     CHIEF COMPLAINT Patient presents for No chief complaint on file.  HISTORY OF PRESENT ILLNESS: Amanda [name] is a 76 y.o. female who presents to the clinic today for:    Referring physician: Shirleen Schirmer, PA-C [facility] [address] [place],  [place] [address]  HISTORICAL INFORMATION:  Selected notes from the MEDICAL RECORD NUMBER Referred by Shirleen Schirmer, PA on 11.16.21 for eval of dry to wet AMD conversion OS.   CURRENT MEDICATIONS: No current outpatient medications on file. (Ophthalmic Drugs)   No current facility-administered medications for this visit. (Ophthalmic Drugs)   Current Outpatient Medications (Other)  Medication Sig   acetaminophen (TYLENOL) 500 MG tablet Take 500 mg by mouth every 6 (six) hours as needed for pain.   amLODipine (NORVASC) 5 MG tablet Take 5 mg by mouth daily.   atorvastatin (LIPITOR) 40 MG tablet Take 40 mg by mouth daily. (Patient not taking: Reported on 11/28/2021)   atorvastatin (LIPITOR) 40 MG tablet Take 1 tablet by mouth daily.   Calcium Carb-Cholecalciferol (CALCIUM + D3) 600-200 MG-UNIT TABS Take 1 tablet by mouth 3 (three) times a week.    fluticasone (FLONASE) 50 MCG/ACT nasal spray Place 2 sprays into the nose daily.   folic acid (FOLVITE) 1 MG tablet Take 1 mg by mouth daily.   hydrochlorothiazide (HYDRODIURIL) 12.5 MG tablet Take 12.5 mg by mouth daily.   ibandronate (BONIVA) 150 MG tablet Take 150 mg by mouth every 30 (thirty) days.   metoprolol (LOPRESSOR) 50 MG tablet Take 50 mg by mouth 2 (two) times daily.   Multiple Vitamin (MULTIVITAMIN) tablet Take 1 tablet by mouth daily.   Multiple Vitamins-Minerals (EYE VITAMINS & MINERALS) TABS Take by mouth.   potassium chloride SA (K-DUR,KLOR-CON) 20 MEQ tablet Take 20 mEq by mouth 3 (three) times a week. Mon, Wed, Fri   No current facility-administered medications for this visit. (Other)    REVIEW OF SYSTEMS:   ALLERGIES Allergies  Allergen Reactions   Codeine Nausea And Vomiting   Demerol [Meperidine] Nausea And Vomiting   Morphine And Related Nausea And Vomiting   Ciprofloxacin Rash   Sulfa Antibiotics Rash   PAST MEDICAL HISTORY Past Medical History:  Diagnosis Date   A-fib ([place])    Adenopathy    Atrial fibrillation (HCC)    CLL (chronic lymphocytic leukemia) ([place]) 08/31/2013   HTN (hypertension)    Hyperlipidemia    Hypertensive retinopathy    Lymphocytosis    Macular degeneration    OU   Past Surgical History:  Procedure Laterality Date   ABDOMINAL HYSTERECTOMY     endometriosis, fibroid   APPENDECTOMY     BREAST BIOPSY     benign cyst.   CATARACT EXTRACTION Bilateral 2019   CHOLECYSTECTOMY     COLONOSCOPY  11/2012   Per Dr. Cristina Gong.  neg.    EYE SURGERY     IR FLUORO GUIDE PORT INSERTION RIGHT  08/12/2017   IR REMOVAL TUN ACCESS W/ PORT W/O FL MOD SED  01/29/2018   IR US GUIDE VASC ACCESS RIGHT  08/12/2017   SQUAMOUS CELL CARCINOMA EXCISION  12/28/2020   Excised from top of head   THUMB ARTHROSCOPY     TOTAL HIP ARTHROPLASTY  2010   FAMILY HISTORY Family History  Problem Relation Age of Onset   Renal Disease Mother    Cancer Mother  colon cancer   Cancer Father        colon cancer   SOCIAL HISTORY Social History   Tobacco Use   Smoking status: Never   Smokeless tobacco: Never  Vaping Use   Vaping Use: Never used  Substance Use Topics   Alcohol use: No   Drug use: No       OPHTHALMIC EXAM: Not recorded    IMAGING AND PROCEDURES  Imaging and Procedures for 01/23/2022          ASSESSMENT/PLAN:  No diagnosis found.  1. Exudative age related macular degeneration, OS  - [facility] pt with known history of nonexudative ARMD OU  - acute "haze over vision" OS -- onset, Friday 11.12.21 -- initial exam with central subretinal heme  - s/p IVA OS #1 (11.17.21), #2 (12.15.21), #3 (01.12.22), #4 (02.09.22), #5  (03.16.22), #6 (04.20.22), #7 (06.01.22), #8 (06.29.22)--IVA resistance  - s/p IVE OS #1 (07.27.22), #2 (08.24.22), #3 (09.21.22), #4 (10.19.22), #5 (11.16.22), #6 (12.16.22), #7 (01.18.23), #8 (02.15.23)  - BCVA 20/25+1  - exam and OCT shows mild decrease in SRF overlying stable PED at 4 wks  - discussed possible IVE resistance; possible Vabysmo candidate   - recommend IVE OS #9 (03.15.23) - pt wishes to be treated with IVE OS - RBA of procedure discussed, questions answered - informed consent obtained and signed 07.27.22 - see procedure note  - f/u in 4 wks -- DFE/OCT, possible injection  2. Age related macular degeneration, non-exudative OD  - OCT shows interval decrease in central PEDs  - The incidence, anatomy, and pathology of dry AMD, risk of progression, and the AREDS and AREDS 2 study including smoking risks discussed with patient.   - recommend Amsler grid monitoring  3,4. Hypertensive retinopathy OU - discussed importance of tight BP control - monitor    5. Pseudophakia OU  - s/p CE/IOL OU  - IOLs in good position, doing well  - monitor   Ophthalmic Meds Ordered this visit:  No orders of the defined types were placed in this encounter.    No follow-ups on file.  There are no Patient Instructions on file for this visit.  This document serves as a record of services personally performed by Gardiner Sleeper, MD, PhD. It was created on their behalf by Orvan Falconer, an ophthalmic technician. The creation of this record is the provider's dictation and/or activities during the visit.    Electronically signed by: Orvan Falconer, OA, 01/15/22  10:36 AM    Gardiner Sleeper, M.D., Ph.D. Diseases & Surgery of the Retina and Vitreous [facility]  I have reviewed the above documentation for accuracy and completeness, and I agree with the above. Gardiner Sleeper, M.D., Ph.D. 12/27/21 10:36 AM  Abbreviations: M myopia (nearsighted); A astigmatism; H  hyperopia (farsighted); P presbyopia; Mrx spectacle prescription;  CTL contact lenses; OD right eye; OS left eye; OU both eyes  XT exotropia; ET esotropia; PEK punctate epithelial keratitis; PEE punctate epithelial erosions; DES dry eye syndrome; MGD meibomian gland dysfunction; ATs artificial tears; PFAT's preservative free artificial tears; [place] nuclear sclerotic cataract; PSC posterior subcapsular cataract; ERM epi-retinal membrane; PVD posterior vitreous detachment; RD retinal detachment; DM diabetes mellitus; DR diabetic retinopathy; NPDR non-proliferative diabetic retinopathy; PDR proliferative diabetic retinopathy; CSME clinically significant macular edema; DME diabetic macular edema; dbh dot blot hemorrhages; CWS cotton wool spot; POAG primary open angle glaucoma; C/D cup-to-disc ratio; HVF humphrey visual field; GVF goldmann visual field;  OCT optical coherence tomography; IOP intraocular pressure; BRVO Branch retinal vein occlusion; CRVO central retinal vein occlusion; CRAO central retinal artery occlusion; BRAO branch retinal artery occlusion; RT retinal tear; SB scleral buckle; PPV pars plana vitrectomy; VH Vitreous hemorrhage; PRP panretinal laser photocoagulation; IVK intravitreal kenalog; VMT vitreomacular traction; MH Macular hole;  NVD neovascularization of the disc; NVE neovascularization elsewhere; AREDS age related eye disease study; ARMD age related macular degeneration; POAG primary open angle glaucoma; EBMD epithelial/anterior basement membrane dystrophy; ACIOL anterior chamber intraocular lens; IOL intraocular lens; PCIOL posterior chamber intraocular lens; Phaco/IOL phacoemulsification with intraocular lens placement; [place] photorefractive keratectomy; LASIK laser assisted in situ keratomileusis; HTN hypertension; DM diabetes mellitus; COPD chronic obstructive pulmonary disease

## 2022-01-18 ENCOUNTER — Ambulatory Visit
Admission: RE | Admit: 2022-01-18 | Discharge: 2022-01-18 | Disposition: A | Discharge status: Home / Self Care | Payer: Medicare Other | Source: Ambulatory Visit | Attending: Family Medicine | Admitting: Family Medicine

## 2022-01-18 DIAGNOSIS — N6002 Solitary cyst of left breast: Secondary | ICD-10-CM | POA: Diagnosis not present

## 2022-01-18 DIAGNOSIS — R928 Other abnormal and inconclusive findings on diagnostic imaging of breast: Secondary | ICD-10-CM

## 2022-01-23 ENCOUNTER — Other Ambulatory Visit: Payer: Self-pay

## 2022-01-23 ENCOUNTER — Ambulatory Visit (INDEPENDENT_AMBULATORY_CARE_PROVIDER_SITE_OTHER): Payer: Medicare Other | Admitting: Ophthalmology

## 2022-01-23 ENCOUNTER — Encounter (INDEPENDENT_AMBULATORY_CARE_PROVIDER_SITE_OTHER): Payer: Self-pay | Admitting: Ophthalmology

## 2022-01-23 DIAGNOSIS — I10 Essential (primary) hypertension: Secondary | ICD-10-CM

## 2022-01-23 DIAGNOSIS — H353221 Exudative age-related macular degeneration, left eye, with active choroidal neovascularization: Secondary | ICD-10-CM

## 2022-01-23 DIAGNOSIS — H353132 Nonexudative age-related macular degeneration, bilateral, intermediate dry stage: Secondary | ICD-10-CM

## 2022-01-23 DIAGNOSIS — Z961 Presence of intraocular lens: Secondary | ICD-10-CM

## 2022-01-23 DIAGNOSIS — H353231 Exudative age-related macular degeneration, bilateral, with active choroidal neovascularization: Secondary | ICD-10-CM | POA: Diagnosis not present

## 2022-01-23 DIAGNOSIS — H353211 Exudative age-related macular degeneration, right eye, with active choroidal neovascularization: Secondary | ICD-10-CM

## 2022-01-23 DIAGNOSIS — H35033 Hypertensive retinopathy, bilateral: Secondary | ICD-10-CM

## 2022-01-23 MED ORDER — AFLIBERCEPT 2MG/0.05ML IZ SOLN FOR KALEIDOSCOPE
2.0000 mg | INTRAVITREAL | Status: AC | PRN
  Administered 2022-01-23: 2 mg via INTRAVITREAL

## 2022-01-23 MED ORDER — FARICIMAB-SVOA 6 MG/0.05ML IZ SOLN
6.0000 mg | INTRAVITREAL | Status: AC | PRN
  Administered 2022-01-23: 6 mg via INTRAVITREAL

## 2022-01-28 ENCOUNTER — Encounter (INDEPENDENT_AMBULATORY_CARE_PROVIDER_SITE_OTHER): Payer: Medicare Other | Admitting: Ophthalmology

## 2022-02-18 NOTE — Progress Notes (Signed)
?[facility] Note ? ?02/20/2022 ? ?  ? ?CHIEF COMPLAINT ?Patient presents for Retina Follow Up ? ?HISTORY OF PRESENT ILLNESS: ?Amanda [name] is a 76 y.o. female who presents to the clinic today for:  ?HPI   ? ? Retina Follow Up   ?Patient presents with  Wet AMD.  In both eyes.  This started 4 weeks ago.  I, the attending physician,  performed the HPI with the patient and updated documentation appropriately. ? ?  ?  ? ? Comments   ?Patient here for 4 weeks retina follow up for exu ARMD OU. Patient states vision about the same. No eye pain.  ? ?  ?  ?Last edited by Bernarda Caffey, MD on 02/23/2022  2:29 AM.  ?  ? ? ?Referring physician: ?Shirleen Schirmer, PA-C ?[facility] ?[address] ?[address] ?[place],  [place] [address] ? ?HISTORICAL INFORMATION:  ?Selected notes from the [place] ?Referred by Shirleen Schirmer, PA on 11.16.21 for eval of dry to wet AMD conversion OS.  ? ?CURRENT MEDICATIONS: ?No current outpatient medications on file. (Ophthalmic Drugs)  ? ?No current facility-administered medications for this visit. (Ophthalmic Drugs)  ? ?Current Outpatient Medications (Other)  ?Medication Sig  ? acetaminophen (TYLENOL) 500 MG tablet Take 500 mg by mouth every 6 (six) hours as needed for pain.  ? amLODipine (NORVASC) 5 MG tablet Take 5 mg by mouth daily.  ? atorvastatin (LIPITOR) 40 MG tablet Take 1 tablet by mouth daily.  ? Calcium Carb-Cholecalciferol (CALCIUM + D3) 600-200 MG-UNIT TABS Take 1 tablet by mouth 3 (three) times a week.   ? fluticasone (FLONASE) 50 MCG/ACT nasal spray Place 2 sprays into the nose daily.  ? folic acid (FOLVITE) 1 MG tablet Take 1 mg by mouth daily.  ? hydrochlorothiazide (HYDRODIURIL) 12.5 MG tablet Take 12.5 mg by mouth daily.  ? ibandronate (BONIVA) 150 MG tablet Take 150 mg by mouth every 30 (thirty) days.  ? metoprolol (LOPRESSOR) 50 MG tablet Take 50 mg by mouth 2 (two) times daily.  ? Multiple Vitamin (MULTIVITAMIN) tablet  Take 1 tablet by mouth daily.  ? Multiple Vitamins-Minerals (EYE VITAMINS & MINERALS) TABS Take by mouth.  ? potassium chloride SA (K-DUR,KLOR-CON) 20 MEQ tablet Take 20 mEq by mouth 3 (three) times a week. Mon, Wed, Fri  ? atorvastatin (LIPITOR) 40 MG tablet Take 40 mg by mouth daily. (Patient not taking: Reported on 11/28/2021)  ? ?No current facility-administered medications for this visit. (Other)  ? ?REVIEW OF SYSTEMS: ?ROS   ?Positive for: Cardiovascular, Eyes ?Negative for: Constitutional, Gastrointestinal, Neurological, Skin, Genitourinary, Musculoskeletal, HENT, Endocrine, Respiratory, Psychiatric, Allergic/Imm, Heme/Lymph ?Last edited by Theodore Demark, COA on 02/20/2022  1:28 PM.  ?  ? ?ALLERGIES ?Allergies  ?Allergen Reactions  ? Codeine Nausea And Vomiting  ? Demerol [Meperidine] Nausea And Vomiting  ? Morphine And Related Nausea And Vomiting  ? Ciprofloxacin Rash  ? Sulfa Antibiotics Rash  ? ?PAST MEDICAL HISTORY ?Past Medical History:  ?Diagnosis Date  ? A-fib ([place])   ? Adenopathy   ? Atrial fibrillation ([place])   ? CLL (chronic lymphocytic leukemia) ([place]) 08/31/2013  ? HTN (hypertension)   ? Hyperlipidemia   ? Hypertensive retinopathy   ? Lymphocytosis   ? Macular degeneration   ? OU  ? ?Past Surgical History:  ?Procedure Laterality Date  ? ABDOMINAL HYSTERECTOMY    ? endometriosis, fibroid  ? APPENDECTOMY    ? BREAST BIOPSY    ?  benign cyst.  ? CATARACT EXTRACTION Bilateral 2019  ? CHOLECYSTECTOMY    ? COLONOSCOPY  11/2012  ? Per Dr. Cristina Gong.  neg.   ? EYE SURGERY    ? IR FLUORO GUIDE PORT INSERTION RIGHT  08/12/2017  ? IR REMOVAL TUN ACCESS W/ PORT W/O FL MOD SED  01/29/2018  ? IR US GUIDE VASC ACCESS RIGHT  08/12/2017  ? SQUAMOUS CELL CARCINOMA EXCISION  12/28/2020  ? Excised from top of head  ? THUMB ARTHROSCOPY    ? TOTAL HIP ARTHROPLASTY  2010  ? ?FAMILY HISTORY ?Family History  ?Problem Relation Age of Onset  ? Renal Disease Mother   ? Cancer Mother   ?     colon cancer  ? Cancer Father   ?      colon cancer  ? ?SOCIAL HISTORY ?Social History  ? ?Tobacco Use  ? Smoking status: Never  ? Smokeless tobacco: Never  ?Vaping Use  ? Vaping Use: Never used  ?Substance Use Topics  ? Alcohol use: No  ? Drug use: No  ?  ? ?  ?OPHTHALMIC EXAM: ?Base Eye Exam   ? ? Visual Acuity (Snellen - Linear)   ? ?   Right Left  ? Dist cc 20/30 -1 20/20 -2  ? Dist ph cc 20/25 -2   ? ? Correction: Glasses  ? ?  ?  ? ? Tonometry (Tonopen, 1:26 PM)   ? ?   Right Left  ? Pressure 13 14  ? ?  ?  ? ? Pupils   ? ?   Dark Light Shape React APD  ? Right 3 2 Round Brisk None  ? Left 3 2 Round Brisk None  ? ?  ?  ? ? Visual Fields (Counting fingers)   ? ?   Left Right  ?  Full Full  ? ?  ?  ? ? Extraocular Movement   ? ?   Right Left  ?  Full, Ortho Full, Ortho  ? ?  ?  ? ? Neuro/Psych   ? ? Oriented x3: Yes  ? Mood/Affect: Normal  ? ?  ?  ? ? Dilation   ? ? Both eyes: 1.0% Mydriacyl, 2.5% Phenylephrine @ 1:26 PM  ? ?  ?  ? ?  ? ?Slit Lamp and Fundus Exam   ? ? Slit Lamp Exam   ? ?   Right Left  ? Lids/Lashes Dermatochalasis - upper lid, mild Meibomian gland dysfunction Dermatochalasis - upper lid, mild Meibomian gland dysfunction  ? Conjunctiva/Sclera White and quiet White and quiet  ? Cornea Trace Debris in tear film, well healed temporal cataract wounds, 1+ pigmented Guttata, trace PEE well healed temporal cataract wounds, 2+ Punctate epithelial erosions  ? Anterior Chamber Deep and quiet Deep and quiet  ? Iris Round and dilated Round and dilated  ? Lens PC IOL in good position, trace PCO PC IOL in good position  ? Anterior Vitreous Trace Vitreous syneresis, Posterior vitreous detachment Trace Vitreous syneresis, Posterior vitreous detachment  ? ?  ?  ? ? Fundus Exam   ? ?   Right Left  ? Disc Compact, Pallor, Sharp rim, mild PPA Compact, Pink and Sharp, temporal PPP  ? C/D Ratio 0.1 0.2  ? Macula Blunted foveal reflex, Drusen, RPE mottling, clumping and early atrophy, +PED's shallowing, SRH / edema temporal macula - improved Blunted  foveal reflex, +CNV, Drusen, RPE mottling and clumping; interval improvement in SRF overlying PED, no heme  ?  Vessels attenuated, Tortuous attenuated, mild tortuousity  ? Periphery Attached; mild reticular degeneration, no heme Attached, reticular degeneration, No heme  ? ?  ?  ? ?  ? ?Refraction   ? ? Wearing Rx   ? ?   Sphere Cylinder Axis Add  ? Right -0.25 +0.75 165 +2.50  ? Left -0.50 +0.50 012 +2.50  ? ? Type: PAL  ? ?  ?  ? ?  ? ?IMAGING AND PROCEDURES  ?Imaging and Procedures for 02/20/2022 ? ?OCT, Retina - OU - Both Eyes   ? ?   ?Right Eye ?Quality was good. Central Foveal Thickness: 266. Progression has improved. Findings include no IRF, retinal drusen , pigment epithelial detachment, outer retinal atrophy, normal foveal contour, subretinal hyper-reflective material, subretinal fluid (interval improvement in PED and overlying SRHM ).  ? ?Left Eye ?Quality was good. Central Foveal Thickness: 261. Progression has improved. Findings include abnormal foveal contour, subretinal hyper-reflective material, no IRF, pigment epithelial detachment, choroidal neovascular membrane, outer retinal atrophy, retinal drusen , subretinal fluid (Interval improvement in SRF overlying stable PED; trace residual SRF in ST corner of PED).  ? ?Notes ?*Images captured and stored on drive ? ?Diagnosis / Impression:  ?OD: exu ARMD -- Interval improvement in PED and overlying SRHM  ?OS: exu ARMD -- Interval improvement in SRF overlying stable PED; trace residual SRF in ST corner of PED ? ?Clinical management:  ?See below ? ?Abbreviations: NFP - Normal foveal profile. CME - cystoid macular edema. PED - pigment epithelial detachment. IRF - intraretinal fluid. SRF - subretinal fluid. EZ - ellipsoid zone. ERM - epiretinal membrane. ORA - outer retinal atrophy. ORT - outer retinal tubulation. SRHM - subretinal hyper-reflective material. IRHM - intraretinal hyper-reflective material ? ? ?  ? ?Intravitreal Injection, Pharmacologic Agent - OD  - Right Eye   ? ?   ?Time Out ?02/20/2022. 1:50 PM. Confirmed correct patient, procedure, site, and patient consented.  ? ?Anesthesia ?Topical anesthesia was used. Anesthetic medications included Lidocaine 2

## 2022-02-20 ENCOUNTER — Ambulatory Visit (INDEPENDENT_AMBULATORY_CARE_PROVIDER_SITE_OTHER): Payer: Medicare Other | Admitting: Ophthalmology

## 2022-02-20 ENCOUNTER — Encounter (INDEPENDENT_AMBULATORY_CARE_PROVIDER_SITE_OTHER): Payer: Self-pay | Admitting: Ophthalmology

## 2022-02-20 DIAGNOSIS — H353221 Exudative age-related macular degeneration, left eye, with active choroidal neovascularization: Secondary | ICD-10-CM

## 2022-02-20 DIAGNOSIS — H353211 Exudative age-related macular degeneration, right eye, with active choroidal neovascularization: Secondary | ICD-10-CM

## 2022-02-20 DIAGNOSIS — H35033 Hypertensive retinopathy, bilateral: Secondary | ICD-10-CM

## 2022-02-20 DIAGNOSIS — H353231 Exudative age-related macular degeneration, bilateral, with active choroidal neovascularization: Secondary | ICD-10-CM

## 2022-02-20 DIAGNOSIS — Z961 Presence of intraocular lens: Secondary | ICD-10-CM

## 2022-02-20 DIAGNOSIS — I10 Essential (primary) hypertension: Secondary | ICD-10-CM

## 2022-02-23 ENCOUNTER — Encounter (INDEPENDENT_AMBULATORY_CARE_PROVIDER_SITE_OTHER): Payer: Self-pay | Admitting: Ophthalmology

## 2022-02-23 MED ORDER — AFLIBERCEPT 2MG/0.05ML IZ SOLN FOR KALEIDOSCOPE
2.0000 mg | INTRAVITREAL | Status: AC | PRN
  Administered 2022-02-23: 2 mg via INTRAVITREAL

## 2022-02-23 MED ORDER — FARICIMAB-SVOA 6 MG/0.05ML IZ SOLN
6.0000 mg | INTRAVITREAL | Status: AC | PRN
  Administered 2022-02-23: 6 mg via INTRAVITREAL

## 2022-03-11 NOTE — Progress Notes (Signed)
?[facility] Note ? ?03/20/2022 ? ?  ? ?CHIEF COMPLAINT ?Patient presents for Retina Follow Up ? ?HISTORY OF PRESENT ILLNESS: ?Amanda [name] is a 76 y.o. female who presents to the clinic today for:  ?HPI   ? ? Retina Follow Up   ?Patient presents with  Wet AMD.  In left eye.  Duration of 4 weeks.  Since onset it is stable.  I, the attending physician,  performed the HPI with the patient and updated documentation appropriately. ? ?  ?  ? ? Comments   ?4 week follow up Exu ARMD OU- Doing well, no new problems.  Vision appears stable OU.  ? ?  ?  ?Last edited by Bernarda Caffey, MD on 03/20/2022  8:11 PM.  ?  ?Pt states no notice in [place] change.  ? ? ?Referring physician: ?Shirleen Schirmer, PA-C ?[facility] ?[address] ?[address] ?[place],  [place] [address] ? ? ?HISTORICAL INFORMATION:  ?Selected notes from the [place] ?Referred by Shirleen Schirmer, PA on 11.16.21 for eval of dry to wet AMD conversion OS.  ? ?CURRENT MEDICATIONS: ?No current outpatient medications on file. (Ophthalmic Drugs)  ? ?No current facility-administered medications for this visit. (Ophthalmic Drugs)  ? ?Current Outpatient Medications (Other)  ?Medication Sig  ? acetaminophen (TYLENOL) 500 MG tablet Take 500 mg by mouth every 6 (six) hours as needed for pain.  ? amLODipine (NORVASC) 5 MG tablet Take 5 mg by mouth daily.  ? atorvastatin (LIPITOR) 40 MG tablet Take 1 tablet by mouth daily.  ? Calcium Carb-Cholecalciferol (CALCIUM + D3) 600-200 MG-UNIT TABS Take 1 tablet by mouth 3 (three) times a week.   ? fluticasone (FLONASE) 50 MCG/ACT nasal spray Place 2 sprays into the nose daily.  ? folic acid (FOLVITE) 1 MG tablet Take 1 mg by mouth daily.  ? hydrochlorothiazide (HYDRODIURIL) 12.5 MG tablet Take 12.5 mg by mouth daily.  ? ibandronate (BONIVA) 150 MG tablet Take 150 mg by mouth every 30 (thirty) days.  ? metoprolol (LOPRESSOR) 50 MG tablet Take 50 mg by mouth 2 (two) times daily.  ?  Multiple Vitamin (MULTIVITAMIN) tablet Take 1 tablet by mouth daily.  ? Multiple Vitamins-Minerals (EYE VITAMINS & MINERALS) TABS Take by mouth.  ? potassium chloride SA (K-DUR,KLOR-CON) 20 MEQ tablet Take 20 mEq by mouth 3 (three) times a week. Mon, Wed, Fri  ? atorvastatin (LIPITOR) 40 MG tablet Take 40 mg by mouth daily. (Patient not taking: Reported on 11/28/2021)  ? ?No current facility-administered medications for this visit. (Other)  ? ?REVIEW OF SYSTEMS: ?ROS   ?Positive for: Cardiovascular, Eyes ?Negative for: Constitutional, Gastrointestinal, Neurological, Skin, Genitourinary, Musculoskeletal, HENT, Endocrine, Respiratory, Psychiatric, Allergic/Imm, Heme/Lymph ?Last edited by Leonie Douglas, [place] on 03/20/2022  2:01 PM.  ?  ? ?ALLERGIES ?Allergies  ?Allergen Reactions  ? Codeine Nausea And Vomiting  ? Demerol [Meperidine] Nausea And Vomiting  ? Morphine And Related Nausea And Vomiting  ? Ciprofloxacin Rash  ? Sulfa Antibiotics Rash  ? ?PAST MEDICAL HISTORY ?Past Medical History:  ?Diagnosis Date  ? A-fib ([place])   ? Adenopathy   ? Atrial fibrillation ([place])   ? CLL (chronic lymphocytic leukemia) ([place]) 08/31/2013  ? HTN (hypertension)   ? Hyperlipidemia   ? Hypertensive retinopathy   ? Lymphocytosis   ? Macular degeneration   ? OU  ? ?Past Surgical History:  ?Procedure Laterality Date  ? ABDOMINAL HYSTERECTOMY    ? endometriosis, fibroid  ?  APPENDECTOMY    ? BREAST BIOPSY    ? benign cyst.  ? CATARACT EXTRACTION Bilateral 2019  ? CHOLECYSTECTOMY    ? COLONOSCOPY  11/2012  ? Per Dr. Cristina Gong.  neg.   ? EYE SURGERY    ? IR FLUORO GUIDE PORT INSERTION RIGHT  08/12/2017  ? IR REMOVAL TUN ACCESS W/ PORT W/O FL MOD SED  01/29/2018  ? IR US GUIDE VASC ACCESS RIGHT  08/12/2017  ? SQUAMOUS CELL CARCINOMA EXCISION  12/28/2020  ? Excised from top of head  ? THUMB ARTHROSCOPY    ? TOTAL HIP ARTHROPLASTY  2010  ? ?FAMILY HISTORY ?Family History  ?Problem Relation Age of Onset  ? Renal Disease Mother   ? Cancer Mother   ?      colon cancer  ? Cancer Father   ?     colon cancer  ? ?SOCIAL HISTORY ?Social History  ? ?Tobacco Use  ? Smoking status: Never  ? Smokeless tobacco: Never  ?Vaping Use  ? Vaping Use: Never used  ?Substance Use Topics  ? Alcohol use: No  ? Drug use: No  ?  ? ?  ?OPHTHALMIC EXAM: ?Base Eye Exam   ? ? Visual Acuity (Snellen - Linear)   ? ?   Right Left  ? Dist cc 20/40 -2 20/20 -2  ? Dist ph cc 20/30 +2   ? ? Correction: Glasses  ? ?  ?  ? ? Tonometry (Tonopen, 2:07 PM)   ? ?   Right Left  ? Pressure 13 13  ? ?  ?  ? ? Pupils   ? ?   Dark Light Shape React APD  ? Right 3 2 Round Brisk None  ? Left 3 2 Round Brisk None  ? ?  ?  ? ? Visual Fields (Counting fingers)   ? ?   Left Right  ?  Full Full  ? ?  ?  ? ? Extraocular Movement   ? ?   Right Left  ?  Full Full  ? ?  ?  ? ? Neuro/Psych   ? ? Oriented x3: Yes  ? Mood/Affect: Normal  ? ?  ?  ? ? Dilation   ? ? Both eyes: 1.0% Mydriacyl, 2.5% Phenylephrine @ 2:07 PM  ? ?  ?  ? ?  ? ?Slit Lamp and Fundus Exam   ? ? Slit Lamp Exam   ? ?   Right Left  ? Lids/Lashes Dermatochalasis - upper lid, mild Meibomian gland dysfunction Dermatochalasis - upper lid, mild Meibomian gland dysfunction  ? Conjunctiva/Sclera White and quiet White and quiet  ? Cornea Trace Debris in tear film, well healed temporal cataract wounds, 1+ pigmented Guttata, trace PEE well healed temporal cataract wounds, 2+ Punctate epithelial erosions  ? Anterior Chamber Deep and quiet Deep and quiet  ? Iris Round and dilated Round and dilated  ? Lens PC IOL in good position, trace PCO PC IOL in good position  ? Anterior Vitreous Trace Vitreous syneresis, Posterior vitreous detachment Trace Vitreous syneresis, Posterior vitreous detachment  ? ?  ?  ? ? Fundus Exam   ? ?   Right Left  ? Disc Compact, Pallor, Sharp rim, mild PPA Compact, Pink and Sharp, temporal PPP  ? C/D Ratio 0.1 0.2  ? Macula Blunted foveal reflex, Drusen, RPE mottling, clumping and early atrophy, +PED's shallowing, SRH / edema temporal macula  - improving Blunted foveal reflex, +CNV, Drusen, RPE mottling and clumping; persistent  SRF, overlying PED, no heme  ? Vessels attenuated, Tortuous attenuated, mild tortuousity  ? Periphery Attached; mild reticular degeneration, no heme Attached, reticular degeneration, No heme  ? ?  ?  ? ?  ? ?Refraction   ? ? Wearing Rx   ? ?   Sphere Cylinder Axis Add  ? Right -0.25 +0.75 165 +2.50  ? Left -0.50 +0.50 012 +2.50  ? ? Type: PAL  ? ?  ?  ? ?  ? ?IMAGING AND PROCEDURES  ?Imaging and Procedures for 03/20/2022 ? ?OCT, Retina - OU - Both Eyes   ? ?   ?Right Eye ?Quality was good. Central Foveal Thickness: 266. Progression has improved. Findings include no IRF, retinal drusen , pigment epithelial detachment, outer retinal atrophy, normal foveal contour, subretinal hyper-reflective material, subretinal fluid, no SRF, intraretinal hyper-reflective material (interval improvement in PED and overlying SRHM ).  ? ?Left Eye ?Quality was good. Central Foveal Thickness: 259. Progression has worsened. Findings include abnormal foveal contour, subretinal hyper-reflective material, no IRF, pigment epithelial detachment, choroidal neovascular membrane, outer retinal atrophy, retinal drusen , subretinal fluid (Persistent SRF overlying stable PED; trace residual SRF in ST corner of PED--slightly increased).  ? ?Notes ?*Images captured and stored on drive ? ?Diagnosis / Impression:  ?OD: exu ARMD -- Interval improvement in PED and overlying SRHM  ?OS: exu ARMD -- Persistent SRF overlying stable PED; trace residual SRF in ST corner of PED--slightly increased ? ?Clinical management:  ?See below ? ?Abbreviations: NFP - Normal foveal profile. CME - cystoid macular edema. PED - pigment epithelial detachment. IRF - intraretinal fluid. SRF - subretinal fluid. EZ - ellipsoid zone. ERM - epiretinal membrane. ORA - outer retinal atrophy. ORT - outer retinal tubulation. SRHM - subretinal hyper-reflective material. IRHM - intraretinal  hyper-reflective material ? ? ?  ? ?Intravitreal Injection, Pharmacologic Agent - OD - Right Eye   ? ?   ?Time Out ?03/20/2022. 2:56 PM. Confirmed correct patient, procedure, site, and patient consented.  ? ?Anesthesia ?Topi

## 2022-03-20 ENCOUNTER — Ambulatory Visit (INDEPENDENT_AMBULATORY_CARE_PROVIDER_SITE_OTHER): Payer: Medicare Other | Admitting: Ophthalmology

## 2022-03-20 ENCOUNTER — Encounter (INDEPENDENT_AMBULATORY_CARE_PROVIDER_SITE_OTHER): Payer: Self-pay | Admitting: Ophthalmology

## 2022-03-20 DIAGNOSIS — H353221 Exudative age-related macular degeneration, left eye, with active choroidal neovascularization: Secondary | ICD-10-CM

## 2022-03-20 DIAGNOSIS — I10 Essential (primary) hypertension: Secondary | ICD-10-CM | POA: Diagnosis not present

## 2022-03-20 DIAGNOSIS — H353231 Exudative age-related macular degeneration, bilateral, with active choroidal neovascularization: Secondary | ICD-10-CM | POA: Diagnosis not present

## 2022-03-20 DIAGNOSIS — H353132 Nonexudative age-related macular degeneration, bilateral, intermediate dry stage: Secondary | ICD-10-CM

## 2022-03-20 DIAGNOSIS — H35033 Hypertensive retinopathy, bilateral: Secondary | ICD-10-CM

## 2022-03-20 DIAGNOSIS — H353211 Exudative age-related macular degeneration, right eye, with active choroidal neovascularization: Secondary | ICD-10-CM

## 2022-03-20 DIAGNOSIS — Z961 Presence of intraocular lens: Secondary | ICD-10-CM | POA: Diagnosis not present

## 2022-03-20 MED ORDER — AFLIBERCEPT 2MG/0.05ML IZ SOLN FOR KALEIDOSCOPE
2.0000 mg | INTRAVITREAL | Status: AC | PRN
  Administered 2022-03-20: 2 mg via INTRAVITREAL

## 2022-03-20 MED ORDER — FARICIMAB-SVOA 6 MG/0.05ML IZ SOLN
6.0000 mg | INTRAVITREAL | Status: AC | PRN
  Administered 2022-03-20: 6 mg via INTRAVITREAL

## 2022-04-15 NOTE — Progress Notes (Shared)
[facility] Note  04/17/2022     CHIEF COMPLAINT Patient presents for Retina Follow Up  HISTORY OF PRESENT ILLNESS: Amanda [name] is a 76 y.o. female who presents to the clinic today for:  HPI     Retina Follow Up   Patient presents with  Wet AMD.  In both eyes.  This started 4 weeks ago.  I, the attending physician,  performed the HPI with the patient and updated documentation appropriately.        Comments   Patient here for 4 weeks retina follow up for exu ARMD OS, non exu ARMD OD. Patient states vision the same. No eye pain.       Last edited by Bernarda Caffey, MD on 04/17/2022  5:03 PM.     Referring physician: Shirleen Schirmer, PA-C [facility] [address] [place] [address]  HISTORICAL INFORMATION:  Selected notes from the MEDICAL RECORD NUMBER Referred by Shirleen Schirmer, PA on 11.16.21 for eval of dry to wet AMD conversion OS.   CURRENT MEDICATIONS: No current outpatient medications on file. (Ophthalmic Drugs)   No current facility-administered medications for this visit. (Ophthalmic Drugs)   Current Outpatient Medications (Other)  Medication Sig   acetaminophen (TYLENOL) 500 MG tablet Take 500 mg by mouth every 6 (six) hours as needed for pain.   amLODipine (NORVASC) 5 MG tablet Take 5 mg by mouth daily.   atorvastatin (LIPITOR) 40 MG tablet Take 1 tablet by mouth daily.   Calcium Carb-Cholecalciferol (CALCIUM + D3) 600-200 MG-UNIT TABS Take 1 tablet by mouth 3 (three) times a week.    fluticasone (FLONASE) 50 MCG/ACT nasal spray Place 2 sprays into the nose daily.   folic acid (FOLVITE) 1 MG tablet Take 1 mg by mouth daily.   hydrochlorothiazide (HYDRODIURIL) 12.5 MG tablet Take 12.5 mg by mouth daily.   ibandronate (BONIVA) 150 MG tablet Take 150 mg by mouth every 30 (thirty) days.   metoprolol (LOPRESSOR) 50 MG tablet Take 50 mg by mouth 2 (two) times daily.   Multiple Vitamin (MULTIVITAMIN)  tablet Take 1 tablet by mouth daily.   Multiple Vitamins-Minerals (EYE VITAMINS & MINERALS) TABS Take by mouth.   potassium chloride SA (K-DUR,KLOR-CON) 20 MEQ tablet Take 20 mEq by mouth 3 (three) times a week. Mon, Wed, Fri   atorvastatin (LIPITOR) 40 MG tablet Take 40 mg by mouth daily. (Patient not taking: Reported on 11/28/2021)   No current facility-administered medications for this visit. (Other)   REVIEW OF SYSTEMS: ROS   Positive for: Cardiovascular, Eyes Negative for: Constitutional, Gastrointestinal, Neurological, Skin, Genitourinary, Musculoskeletal, HENT, Endocrine, Respiratory, Psychiatric, Allergic/Imm, Heme/Lymph Last edited by Theodore Demark, COA on 04/17/2022  2:25 PM.     ALLERGIES Allergies  Allergen Reactions   Codeine Nausea And Vomiting   Demerol [Meperidine] Nausea And Vomiting   Morphine And Related Nausea And Vomiting   Ciprofloxacin Rash   Sulfa Antibiotics Rash   PAST MEDICAL HISTORY Past Medical History:  Diagnosis Date   A-fib [facility])    Adenopathy    Atrial fibrillation ([place])    CLL (chronic lymphocytic leukemia) ([place]) 08/31/2013   HTN (hypertension)    Hyperlipidemia    Hypertensive retinopathy    Lymphocytosis    Macular degeneration    OU   Past Surgical History:  Procedure Laterality Date   ABDOMINAL HYSTERECTOMY     endometriosis, fibroid   APPENDECTOMY     BREAST  BIOPSY     benign cyst.   CATARACT EXTRACTION Bilateral 2019   CHOLECYSTECTOMY     COLONOSCOPY  11/2012   Per Dr. Cristina Gong.  neg.    EYE SURGERY     IR FLUORO GUIDE PORT INSERTION RIGHT  08/12/2017   IR REMOVAL TUN ACCESS W/ PORT W/O FL MOD SED  01/29/2018   IR US GUIDE VASC ACCESS RIGHT  08/12/2017   SQUAMOUS CELL CARCINOMA EXCISION  12/28/2020   Excised from top of head   THUMB ARTHROSCOPY     TOTAL HIP ARTHROPLASTY  2010   FAMILY HISTORY Family History  Problem Relation Age of Onset   Renal Disease Mother    Cancer Mother        colon cancer   Cancer Father         colon cancer   SOCIAL HISTORY Social History   Tobacco Use   Smoking status: Never   Smokeless tobacco: Never  Vaping Use   Vaping Use: Never used  Substance Use Topics   Alcohol use: No   Drug use: No       OPHTHALMIC EXAM: Base Eye Exam     Visual Acuity (Snellen - Linear)       Right Left   Dist cc 20/30 -2 20/20   Dist ph cc 20/25 -1     Correction: Glasses         Tonometry (Tonopen, 2:23 PM)       Right Left   Pressure 14 15         Pupils       Dark Light Shape React APD   Right 3 2 Round Brisk None   Left 3 2 Round Brisk None         Visual Fields (Counting fingers)       Left Right    Full Full         Extraocular Movement       Right Left    Full, Ortho Full, Ortho         Neuro/Psych     Oriented x3: Yes   Mood/Affect: Normal         Dilation     Both eyes: 1.0% Mydriacyl, 2.5% Phenylephrine @ 2:23 PM           Slit Lamp and Fundus Exam     Slit Lamp Exam       Right Left   Lids/Lashes Dermatochalasis - upper lid, mild Meibomian gland dysfunction Dermatochalasis - upper lid, mild Meibomian gland dysfunction   Conjunctiva/Sclera White and quiet White and quiet   Cornea Trace Debris in tear film, well healed temporal cataract wounds, 1+ pigmented Guttata, trace PEE well healed temporal cataract wounds, 2+ Punctate epithelial erosions   Anterior Chamber Deep and quiet Deep and quiet   Iris Round and dilated Round and dilated   Lens PC IOL in good position, trace PCO PC IOL in good position   Anterior Vitreous Trace Vitreous syneresis, Posterior vitreous detachment Trace Vitreous syneresis, Posterior vitreous detachment         Fundus Exam       Right Left   Disc Compact, Pallor, Sharp rim, mild PPA Compact, Pink and Sharp, temporal PPP   C/D Ratio 0.1 0.2   Macula Blunted foveal reflex, Drusen, RPE mottling, clumping and early atrophy, +PED's improved, SRH / edema temporal macula - improving Blunted  foveal reflex, +CNV, Drusen, RPE mottling and clumping; SRF improved, no heme  Vessels attenuated, Tortuous attenuated, mild tortuousity   Periphery Attached; mild reticular degeneration, no heme Attached, reticular degeneration, No heme           Refraction     Wearing Rx       Sphere Cylinder Axis Add   Right -0.25 +0.75 165 +2.50   Left -0.50 +0.50 012 +2.50    Type: PAL           IMAGING AND PROCEDURES  Imaging and Procedures for 04/17/2022  OCT, Retina - OU - Both Eyes       Right Eye Quality was good. Central Foveal Thickness: 280. Progression has improved. Findings include no IRF, retinal drusen , pigment epithelial detachment, outer retinal atrophy, normal foveal contour, subretinal hyper-reflective material, subretinal fluid, no SRF, intraretinal hyper-reflective material (Interval improvement in PED/SRHM; nasal PED collapsed).   Left Eye Quality was good. Central Foveal Thickness: 259. Progression has improved. Findings include abnormal foveal contour, subretinal hyper-reflective material, no IRF, pigment epithelial detachment, choroidal neovascular membrane, outer retinal atrophy, retinal drusen , no SRF (Interval improvement in SRF overlying stable PED -- no SRF).   Notes *Images captured and stored on drive  Diagnosis / Impression:  OD: exu ARMD -- Interval improvement in PED/SRHM; nasal PED collapsed  OS: exu ARMD -- Interval improvement in SRF overlying stable PED -- no SRF  Clinical management:  See below  Abbreviations: NFP - Normal foveal profile. CME - cystoid macular edema. PED - pigment epithelial detachment. IRF - intraretinal fluid. SRF - subretinal fluid. EZ - ellipsoid zone. ERM - epiretinal membrane. ORA - outer retinal atrophy. ORT - outer retinal tubulation. SRHM - subretinal hyper-reflective material. IRHM - intraretinal hyper-reflective material      Intravitreal Injection, Pharmacologic Agent - OS - Left Eye       Time Out 04/17/2022.  3:30 PM. Confirmed correct patient, procedure, site, and patient consented.   Anesthesia Topical anesthesia was used. Anesthetic medications included Lidocaine 2%, Proparacaine 0.5%.   Procedure Preparation included 5% betadine to ocular surface, eyelid speculum. A (32g) needle was used.   Injection: 6 mg faricimab-svoa 6 MG/0.05ML   Route: Intravitreal, Site: Left Eye   NDC: S6832610, Lot: [id number], Expiration date: 11/11/2023, Waste: 0 mL   Post-op Post injection exam found visual acuity of at least counting fingers. The patient tolerated the procedure well. There were no complications. The patient received written and verbal post procedure care education. Post injection medications were not given.      Intravitreal Injection, Pharmacologic Agent - OD - Right Eye       Time Out 04/17/2022. 3:31 PM. Confirmed correct patient, procedure, site, and patient consented.   Anesthesia Topical anesthesia was used. Anesthetic medications included Lidocaine 2%, Proparacaine 0.5%.   Procedure Preparation included 5% betadine to ocular surface, eyelid speculum. A (32g) needle was used.   Injection: 2 mg aflibercept 2 MG/0.05ML   Route: Intravitreal, Site: Right Eye   NDC: A3590391, Lot: [id number], Expiration date: 02/08/2023, Waste: 0 mL   Post-op Post injection exam found visual acuity of at least counting fingers. The patient tolerated the procedure well. There were no complications. The patient received written and verbal post procedure care education. Post injection medications were not given.            ASSESSMENT/PLAN:    ICD-10-CM   1. Exudative age-related macular degeneration of left eye with active choroidal neovascularization (HCC)  H35.3221 OCT, Retina - OU - Both Eyes    Intravitreal Injection, Pharmacologic  Agent - OS - Left Eye    faricimab-svoa (VABYSMO) '6mg'$ /0.12m intravitreal injection    2. Exudative age-related macular degeneration of right eye  with active choroidal neovascularization (HCC)  H35.3211 OCT, Retina - OU - Both Eyes    Intravitreal Injection, Pharmacologic Agent - OD - Right Eye    aflibercept (EYLEA) SOLN 2 mg    3. Essential hypertension  I10     4. Hypertensive retinopathy of both eyes  H35.033     5. Pseudophakia, both eyes  Z96.1      1. Exudative age related macular degeneration, OS  - [facility] pt with known history of nonexudative ARMD OU  - acute "haze over vision" OS -- onset, Friday 11.12.21 -- initial exam with central subretinal heme  - s/p IVA OS #1 (11.17.21), #2 (12.15.21), #3 (01.12.22), #4 (02.09.22), #5 (03.16.22), #6 (04.20.22), #7 (06.01.22), #8 (06.29.22)--IVA resistance  - s/p IVE OS #1 (07.27.22), #2 (08.24.22), #3 (09.21.22), #4 (10.19.22), #5 (11.16.22), #6 (12.16.22), #7 (01.18.23), #8 (02.15.23)--IVE resistance  - s/p IVV OS #1 SAMPLE (03.15.23), #2 (04.12.23), #3 (05.10.23)  - BCVA 20/20  - exam and OCT OS shows: Interval improvement in SRF overlying stable PED -- no SRF at 4 weeks  - recommend IVV OS #4 (06.07.23) with extension to 6 weeks - pt wishes to be treated with IVV OS - RBA of procedure discussed, questions answered - IVV informed consent obtained and signed 03.15.23 (OS) - see procedure note  - f/u in 6 wks -- DFE/OCT, possible injection  2. Exudative age related macular degeneration OD  - conversion to exudative ARMD noted on 03.15.23 exam  - exam with new SRH temporal macula  - s/p IVE OD #1 (03.15.23), #2 (04.12.23), #3 (05.10.23)  - BCVA 20/25 -- improved  - OCT shows  OD: Interval improvement in PED/SRHM; nasal PED collapsed at 4 weeks - recommend IVE OD #4 today, 06.07.23 with extension to 6 weeks  - pt wishes to proceed with injection  - RBA of procedure discussed, questions answered - IVE informed consent obtained and signed, 03.15.23 (OD) - see procedure note  - f/u 6 weeks  3,4. Hypertensive retinopathy OU - discussed importance of tight BP  control - monitor    5. Pseudophakia OU  - s/p CE/IOL OU  - IOLs in good position, doing well  - monitor   Ophthalmic Meds Ordered this visit:  Meds ordered this encounter  Medications   faricimab-svoa (VABYSMO) '6mg'$ /0.034mintravitreal injection   aflibercept (EYLEA) SOLN 2 mg     Return in about 6 weeks (around 05/29/2022) for f/u exu ARMD OU, DFE, OCT.  There are no Patient Instructions on file for this visit.  This document serves as a record of services personally performed by BrGardiner SleeperMD, PhD. It was created on their behalf by MaOrvan Falconeran ophthalmic technician. The creation of this record is the provider's dictation and/or activities during the visit.    Electronically signed by: MaOrvan FalconerOA, 04/17/22  5:05 PM  BrGardiner SleeperM.D., Ph.D. Diseases & Surgery of the Retina and Vitreous [facility] have reviewed the above documentation for accuracy and completeness, and I agree with the above. BrGardiner SleeperM.D., Ph.D. 04/17/22 5:05 PM   Abbreviations: M myopia (nearsighted); A astigmatism; H hyperopia (farsighted); P presbyopia; Mrx spectacle prescription;  CTL contact lenses; OD right eye; OS left eye; OU both eyes  XT exotropia; ET esotropia; PEK punctate  epithelial keratitis; PEE punctate epithelial erosions; DES dry eye syndrome; MGD meibomian gland dysfunction; ATs artificial tears; PFAT's preservative free artificial tears; [place] nuclear sclerotic cataract; PSC posterior subcapsular cataract; ERM epi-retinal membrane; PVD posterior vitreous detachment; RD retinal detachment; DM diabetes mellitus; DR diabetic retinopathy; NPDR non-proliferative diabetic retinopathy; PDR proliferative diabetic retinopathy; CSME clinically significant macular edema; DME diabetic macular edema; dbh dot blot hemorrhages; CWS cotton wool spot; POAG primary open angle glaucoma; C/D cup-to-disc ratio; HVF humphrey visual field; GVF goldmann visual  field; OCT optical coherence tomography; IOP intraocular pressure; BRVO Branch retinal vein occlusion; CRVO central retinal vein occlusion; CRAO central retinal artery occlusion; BRAO branch retinal artery occlusion; RT retinal tear; SB scleral buckle; PPV pars plana vitrectomy; VH Vitreous hemorrhage; PRP panretinal laser photocoagulation; IVK intravitreal kenalog; VMT vitreomacular traction; MH Macular hole;  NVD neovascularization of the disc; NVE neovascularization elsewhere; AREDS age related eye disease study; ARMD age related macular degeneration; POAG primary open angle glaucoma; EBMD epithelial/anterior basement membrane dystrophy; ACIOL anterior chamber intraocular lens; IOL intraocular lens; PCIOL posterior chamber intraocular lens; Phaco/IOL phacoemulsification with intraocular lens placement; [place] photorefractive keratectomy; LASIK laser assisted in situ keratomileusis; HTN hypertension; DM diabetes mellitus; COPD chronic obstructive pulmonary disease

## 2022-04-17 ENCOUNTER — Ambulatory Visit (INDEPENDENT_AMBULATORY_CARE_PROVIDER_SITE_OTHER): Payer: Medicare Other | Admitting: Ophthalmology

## 2022-04-17 ENCOUNTER — Encounter (INDEPENDENT_AMBULATORY_CARE_PROVIDER_SITE_OTHER): Payer: Self-pay | Admitting: Ophthalmology

## 2022-04-17 DIAGNOSIS — H353211 Exudative age-related macular degeneration, right eye, with active choroidal neovascularization: Secondary | ICD-10-CM

## 2022-04-17 DIAGNOSIS — H35033 Hypertensive retinopathy, bilateral: Secondary | ICD-10-CM

## 2022-04-17 DIAGNOSIS — H353231 Exudative age-related macular degeneration, bilateral, with active choroidal neovascularization: Secondary | ICD-10-CM

## 2022-04-17 DIAGNOSIS — I10 Essential (primary) hypertension: Secondary | ICD-10-CM | POA: Diagnosis not present

## 2022-04-17 DIAGNOSIS — Z961 Presence of intraocular lens: Secondary | ICD-10-CM | POA: Diagnosis not present

## 2022-04-17 DIAGNOSIS — H353221 Exudative age-related macular degeneration, left eye, with active choroidal neovascularization: Secondary | ICD-10-CM

## 2022-04-17 DIAGNOSIS — H353132 Nonexudative age-related macular degeneration, bilateral, intermediate dry stage: Secondary | ICD-10-CM

## 2022-04-17 MED ORDER — AFLIBERCEPT 2MG/0.05ML IZ SOLN FOR KALEIDOSCOPE
2.0000 mg | INTRAVITREAL | Status: AC | PRN
  Administered 2022-04-17: 2 mg via INTRAVITREAL

## 2022-04-17 MED ORDER — FARICIMAB-SVOA 6 MG/0.05ML IZ SOLN
6.0000 mg | INTRAVITREAL | Status: AC | PRN
  Administered 2022-04-17: 6 mg via INTRAVITREAL

## 2022-04-19 DIAGNOSIS — M76821 Posterior tibial tendinitis, right leg: Secondary | ICD-10-CM | POA: Diagnosis not present

## 2022-04-19 DIAGNOSIS — M79671 Pain in right foot: Secondary | ICD-10-CM | POA: Diagnosis not present

## 2022-04-19 DIAGNOSIS — M2011 Hallux valgus (acquired), right foot: Secondary | ICD-10-CM | POA: Diagnosis not present

## 2022-04-24 DIAGNOSIS — Z23 Encounter for immunization: Secondary | ICD-10-CM | POA: Diagnosis not present

## 2022-05-17 DIAGNOSIS — M76821 Posterior tibial tendinitis, right leg: Secondary | ICD-10-CM | POA: Diagnosis not present

## 2022-05-17 DIAGNOSIS — M2011 Hallux valgus (acquired), right foot: Secondary | ICD-10-CM | POA: Diagnosis not present

## 2022-05-27 NOTE — Progress Notes (Signed)
[facility] Note  05/29/2022     CHIEF COMPLAINT Patient presents for No chief complaint on file.  HISTORY OF PRESENT ILLNESS: Amanda [name] is a 76 y.o. female who presents to the clinic today for:    Referring physician: Shirleen Schirmer, PA-C [facility] [address] [place],  [place] [address]  HISTORICAL INFORMATION:  Selected notes from the MEDICAL RECORD NUMBER Referred by Shirleen Schirmer, PA on 11.16.21 for eval of dry to wet AMD conversion OS.   CURRENT MEDICATIONS: No current outpatient medications on file. (Ophthalmic Drugs)   No current facility-administered medications for this visit. (Ophthalmic Drugs)   Current Outpatient Medications (Other)  Medication Sig   acetaminophen (TYLENOL) 500 MG tablet Take 500 mg by mouth every 6 (six) hours as needed for pain.   amLODipine (NORVASC) 5 MG tablet Take 5 mg by mouth daily.   atorvastatin (LIPITOR) 40 MG tablet Take 40 mg by mouth daily. (Patient not taking: Reported on 11/28/2021)   atorvastatin (LIPITOR) 40 MG tablet Take 1 tablet by mouth daily.   Calcium Carb-Cholecalciferol (CALCIUM + D3) 600-200 MG-UNIT TABS Take 1 tablet by mouth 3 (three) times a week.    fluticasone (FLONASE) 50 MCG/ACT nasal spray Place 2 sprays into the nose daily.   folic acid (FOLVITE) 1 MG tablet Take 1 mg by mouth daily.   hydrochlorothiazide (HYDRODIURIL) 12.5 MG tablet Take 12.5 mg by mouth daily.   ibandronate (BONIVA) 150 MG tablet Take 150 mg by mouth every 30 (thirty) days.   metoprolol (LOPRESSOR) 50 MG tablet Take 50 mg by mouth 2 (two) times daily.   Multiple Vitamin (MULTIVITAMIN) tablet Take 1 tablet by mouth daily.   Multiple Vitamins-Minerals (EYE VITAMINS & MINERALS) TABS Take by mouth.   potassium chloride SA (K-DUR,KLOR-CON) 20 MEQ tablet Take 20 mEq by mouth 3 (three) times a week. Mon, Wed, Fri   No current facility-administered medications for this visit. (Other)    REVIEW OF SYSTEMS:   ALLERGIES Allergies  Allergen Reactions   Codeine Nausea And Vomiting   Demerol [Meperidine] Nausea And Vomiting   Morphine And Related Nausea And Vomiting   Ciprofloxacin Rash   Sulfa Antibiotics Rash   PAST MEDICAL HISTORY Past Medical History:  Diagnosis Date   A-fib ([place])    Adenopathy    Atrial fibrillation (HCC)    CLL (chronic lymphocytic leukemia) ([place]) 08/31/2013   HTN (hypertension)    Hyperlipidemia    Hypertensive retinopathy    Lymphocytosis    Macular degeneration    OU   Past Surgical History:  Procedure Laterality Date   ABDOMINAL HYSTERECTOMY     endometriosis, fibroid   APPENDECTOMY     BREAST BIOPSY     benign cyst.   CATARACT EXTRACTION Bilateral 2019   CHOLECYSTECTOMY     COLONOSCOPY  11/2012   Per Dr. Cristina Gong.  neg.    EYE SURGERY     IR FLUORO GUIDE PORT INSERTION RIGHT  08/12/2017   IR REMOVAL TUN ACCESS W/ PORT W/O FL MOD SED  01/29/2018   IR US GUIDE VASC ACCESS RIGHT  08/12/2017   SQUAMOUS CELL CARCINOMA EXCISION  12/28/2020   Excised from top of head   THUMB ARTHROSCOPY     TOTAL HIP ARTHROPLASTY  2010   FAMILY HISTORY Family History  Problem Relation Age of Onset   Renal Disease Mother    Cancer Mother  colon cancer   Cancer Father        colon cancer   SOCIAL HISTORY Social History   Tobacco Use   Smoking status: Never   Smokeless tobacco: Never  Vaping Use   Vaping Use: Never used  Substance Use Topics   Alcohol use: No   Drug use: No       OPHTHALMIC EXAM: Not recorded    IMAGING AND PROCEDURES  Imaging and Procedures for 05/29/2022          ASSESSMENT/PLAN:  No diagnosis found.  1. Exudative age related macular degeneration, OS  - [facility] pt with known history of nonexudative ARMD OU  - acute "haze over vision" OS -- onset, Friday 11.12.21 -- initial exam with central subretinal heme  - s/p IVA OS #1 (11.17.21), #2 (12.15.21), #3 (01.12.22), #4 (02.09.22), #5  (03.16.22), #6 (04.20.22), #7 (06.01.22), #8 (06.29.22)--IVA resistance  - s/p IVE OS #1 (07.27.22), #2 (08.24.22), #3 (09.21.22), #4 (10.19.22), #5 (11.16.22), #6 (12.16.22), #7 (01.18.23), #8 (02.15.23)--IVE resistance  - s/p IVV OS #1 SAMPLE (03.15.23), #2 (04.12.23), #3 (05.10.23), #4 (06.07.23)  - BCVA 20/20  - exam and OCT OS shows: Interval improvement in SRF overlying stable PED -- no SRF at 4 weeks  - recommend IVV OS #5 (07.19.23) with extension to 6 weeks - pt wishes to be treated with IVV OS - RBA of procedure discussed, questions answered - IVV informed consent obtained and signed 03.15.23 (OS) - see procedure note  - f/u in 6 wks -- DFE/OCT, possible injection  2. Exudative age related macular degeneration OD  - conversion to exudative ARMD noted on 03.15.23 exam  - exam with new SRH temporal macula  - s/p IVE OD #1 (03.15.23), #2 (04.12.23), #3 (05.10.23), #4 (06.07.23)  - BCVA 20/25 -- improved  - OCT shows  OD: Interval improvement in PED/SRHM; nasal PED collapsed at 4 weeks - recommend IVE OD #5 today, 07.19.23 with extension to 6 weeks  - pt wishes to proceed with injection  - RBA of procedure discussed, questions answered - IVE informed consent obtained and signed, 03.15.23 (OD) - see procedure note  - f/u 6 weeks  3,4. Hypertensive retinopathy OU - discussed importance of tight BP control - monitor     5. Pseudophakia OU  - s/p CE/IOL OU  - IOLs in good position, doing well  - monitor   Ophthalmic Meds Ordered this visit:  No orders of the defined types were placed in this encounter.    No follow-ups on file.  There are no Patient Instructions on file for this visit.  This document serves as a record of services personally performed by Gardiner Sleeper, MD, PhD. It was created on their behalf by Orvan Falconer, an ophthalmic technician. The creation of this record is the provider's dictation and/or activities during the visit.    Electronically signed  by: Orvan Falconer, OA, 05/27/22  3:38 PM  Gardiner Sleeper, M.D., Ph.D. Diseases & Surgery of the Retina and Vitreous [facility]: M myopia (nearsighted); A astigmatism; H hyperopia (farsighted); P presbyopia; Mrx spectacle prescription;  CTL contact lenses; OD right eye; OS left eye; OU both eyes  XT exotropia; ET esotropia; PEK punctate epithelial keratitis; PEE punctate epithelial erosions; DES dry eye syndrome; MGD meibomian gland dysfunction; ATs artificial tears; PFAT's preservative free artificial tears; [place] nuclear sclerotic cataract; PSC posterior subcapsular cataract; ERM epi-retinal membrane; PVD posterior vitreous detachment; RD  retinal detachment; DM diabetes mellitus; DR diabetic retinopathy; NPDR non-proliferative diabetic retinopathy; PDR proliferative diabetic retinopathy; CSME clinically significant macular edema; DME diabetic macular edema; dbh dot blot hemorrhages; CWS cotton wool spot; POAG primary open angle glaucoma; C/D cup-to-disc ratio; HVF humphrey visual field; GVF goldmann visual field; OCT optical coherence tomography; IOP intraocular pressure; BRVO Branch retinal vein occlusion; CRVO central retinal vein occlusion; CRAO central retinal artery occlusion; BRAO branch retinal artery occlusion; RT retinal tear; SB scleral buckle; PPV pars plana vitrectomy; VH Vitreous hemorrhage; PRP panretinal laser photocoagulation; IVK intravitreal kenalog; VMT vitreomacular traction; MH Macular hole;  NVD neovascularization of the disc; NVE neovascularization elsewhere; AREDS age related eye disease study; ARMD age related macular degeneration; POAG primary open angle glaucoma; EBMD epithelial/anterior basement membrane dystrophy; ACIOL anterior chamber intraocular lens; IOL intraocular lens; PCIOL posterior chamber intraocular lens; Phaco/IOL phacoemulsification with intraocular lens placement; [place] photorefractive keratectomy; LASIK laser assisted in  situ keratomileusis; HTN hypertension; DM diabetes mellitus; COPD chronic obstructive pulmonary disease

## 2022-05-29 ENCOUNTER — Encounter (INDEPENDENT_AMBULATORY_CARE_PROVIDER_SITE_OTHER): Payer: Self-pay | Admitting: Ophthalmology

## 2022-05-29 ENCOUNTER — Ambulatory Visit: Payer: Medicare Other | Admitting: Hematology and Oncology

## 2022-05-29 ENCOUNTER — Ambulatory Visit (INDEPENDENT_AMBULATORY_CARE_PROVIDER_SITE_OTHER): Payer: Medicare Other | Admitting: Ophthalmology

## 2022-05-29 ENCOUNTER — Other Ambulatory Visit: Payer: Medicare Other

## 2022-05-29 DIAGNOSIS — H353221 Exudative age-related macular degeneration, left eye, with active choroidal neovascularization: Secondary | ICD-10-CM

## 2022-05-29 DIAGNOSIS — H35033 Hypertensive retinopathy, bilateral: Secondary | ICD-10-CM

## 2022-05-29 DIAGNOSIS — H353211 Exudative age-related macular degeneration, right eye, with active choroidal neovascularization: Secondary | ICD-10-CM

## 2022-05-29 DIAGNOSIS — I10 Essential (primary) hypertension: Secondary | ICD-10-CM

## 2022-05-29 DIAGNOSIS — Z961 Presence of intraocular lens: Secondary | ICD-10-CM

## 2022-05-29 DIAGNOSIS — H353231 Exudative age-related macular degeneration, bilateral, with active choroidal neovascularization: Secondary | ICD-10-CM

## 2022-05-29 DIAGNOSIS — H353132 Nonexudative age-related macular degeneration, bilateral, intermediate dry stage: Secondary | ICD-10-CM

## 2022-05-29 MED ORDER — AFLIBERCEPT 2MG/0.05ML IZ SOLN FOR KALEIDOSCOPE
2.0000 mg | INTRAVITREAL | Status: AC | PRN
  Administered 2022-05-29: 2 mg via INTRAVITREAL

## 2022-05-29 MED ORDER — FARICIMAB-SVOA 6 MG/0.05ML IZ SOLN
6.0000 mg | INTRAVITREAL | Status: AC | PRN
  Administered 2022-05-29: 6 mg via INTRAVITREAL

## 2022-05-30 ENCOUNTER — Encounter: Payer: Self-pay | Admitting: Hematology and Oncology

## 2022-05-30 ENCOUNTER — Inpatient Hospital Stay (HOSPITAL_BASED_OUTPATIENT_CLINIC_OR_DEPARTMENT_OTHER): Payer: Medicare Other | Admitting: Hematology and Oncology

## 2022-05-30 ENCOUNTER — Other Ambulatory Visit: Payer: Self-pay

## 2022-05-30 ENCOUNTER — Inpatient Hospital Stay: Payer: Medicare Other | Attending: Hematology and Oncology

## 2022-05-30 DIAGNOSIS — C9110 Chronic lymphocytic leukemia of B-cell type not having achieved remission: Secondary | ICD-10-CM | POA: Insufficient documentation

## 2022-05-30 DIAGNOSIS — Z79899 Other long term (current) drug therapy: Secondary | ICD-10-CM | POA: Diagnosis not present

## 2022-05-30 DIAGNOSIS — Z85828 Personal history of other malignant neoplasm of skin: Secondary | ICD-10-CM | POA: Insufficient documentation

## 2022-05-30 LAB — CBC WITH DIFFERENTIAL/PLATELET
Abs Immature Granulocytes: 0.04 10*3/uL (ref 0.00–0.07)
Basophils Absolute: 0.1 10*3/uL (ref 0.0–0.1)
Basophils Relative: 1 %
Eosinophils Absolute: 0.2 10*3/uL (ref 0.0–0.5)
Eosinophils Relative: 1 %
HCT: 39.1 % (ref 36.0–46.0)
Hemoglobin: 13.3 g/dL (ref 12.0–15.0)
Immature Granulocytes: 0 %
Lymphocytes Relative: 70 %
Lymphs Abs: 11.8 10*3/uL — ABNORMAL HIGH (ref 0.7–4.0)
MCH: 27.4 pg (ref 26.0–34.0)
MCHC: 34 g/dL (ref 30.0–36.0)
MCV: 80.5 fL (ref 80.0–100.0)
Monocytes Absolute: 1.4 10*3/uL — ABNORMAL HIGH (ref 0.1–1.0)
Monocytes Relative: 8 %
Neutro Abs: 3.4 10*3/uL (ref 1.7–7.7)
Neutrophils Relative %: 20 %
Platelets: 162 10*3/uL (ref 150–400)
RBC: 4.86 MIL/uL (ref 3.87–5.11)
RDW: 13.4 % (ref 11.5–15.5)
Smear Review: NORMAL
WBC: 17 10*3/uL — ABNORMAL HIGH (ref 4.0–10.5)
nRBC: 0 % (ref 0.0–0.2)

## 2022-05-30 NOTE — Assessment & Plan Note (Signed)
We discussed importance of close follow-up with dermatologist and vigilant skin exam

## 2022-05-30 NOTE — Progress Notes (Signed)
[place] OFFICE PROGRESS NOTE  Patient Care Team: Cari Caraway, MD as PCP - General (Family Medicine) Irine Seal, MD as Attending Physician (Urology) Jacolyn Reedy, MD as Attending Physician (Cardiology) Heath Lark, MD as Consulting Physician (Hematology and Oncology)  ASSESSMENT & PLAN:  CLL (chronic lymphocytic leukemia) She has recurrent leukocytosis, likely indicated for relapse of CLL She is not symptomatic and has no signs of lymphadenopathy I recommend seeing her back in about 4 months for further follow-up and she is in agreement She is educated to watch out for signs and symptoms of lymphadenopathy, abnormal weight loss or night sweats  History of skin cancer We discussed importance of close follow-up with dermatologist and vigilant skin exam  No orders of the defined types were placed in this encounter.   All questions were answered. The patient knows to call the clinic with any problems, questions or concerns. The total time spent in the appointment was 20 minutes encounter with patients including review of chart and various tests results, discussions about plan of care and coordination of care plan   Heath Lark, MD 05/30/2022 9:33 AM  INTERVAL HISTORY: Please see below for problem oriented charting. she returns for surveillance follow-up for CLL She is doing well No new lymphadenopathy Appetite is stable She have lost some weight recently due to intentional weight loss through dietary modification  REVIEW OF SYSTEMS:   Constitutional: Denies fevers, chills  Eyes: Denies blurriness of vision Ears, nose, mouth, throat, and face: Denies mucositis or sore throat Respiratory: Denies cough, dyspnea or wheezes Cardiovascular: Denies palpitation, chest discomfort or lower extremity swelling Gastrointestinal:  Denies nausea, heartburn or change in bowel habits Skin: Denies abnormal skin rashes Lymphatics: Denies new lymphadenopathy or easy  bruising Neurological:Denies numbness, tingling or new weaknesses Behavioral/Psych: Mood is stable, no new changes  All other systems were reviewed with the patient and are negative.  I have reviewed the past medical history, past surgical history, social history and family history with the patient and they are unchanged from previous note.  ALLERGIES:  is allergic to codeine, demerol [meperidine], morphine and related, ciprofloxacin, and sulfa antibiotics.  MEDICATIONS:  Current Outpatient Medications  Medication Sig Dispense Refill   acetaminophen (TYLENOL) 500 MG tablet Take 500 mg by mouth every 6 (six) hours as needed for pain.     amLODipine (NORVASC) 5 MG tablet Take 5 mg by mouth daily.     atorvastatin (LIPITOR) 40 MG tablet Take 1 tablet by mouth daily.     Calcium Carb-Cholecalciferol (CALCIUM + D3) 600-200 MG-UNIT TABS Take 1 tablet by mouth 3 (three) times a week.      fluticasone (FLONASE) 50 MCG/ACT nasal spray Place 2 sprays into the nose daily.     folic acid (FOLVITE) 1 MG tablet Take 1 mg by mouth daily.     hydrochlorothiazide (HYDRODIURIL) 12.5 MG tablet Take 12.5 mg by mouth daily.     ibandronate (BONIVA) 150 MG tablet Take 150 mg by mouth every 30 (thirty) days.     metoprolol (LOPRESSOR) 50 MG tablet Take 50 mg by mouth 2 (two) times daily.     Multiple Vitamin (MULTIVITAMIN) tablet Take 1 tablet by mouth daily.     Multiple Vitamins-Minerals (EYE VITAMINS & MINERALS) TABS Take by mouth.     potassium chloride SA (K-DUR,KLOR-CON) 20 MEQ tablet Take 20 mEq by mouth 3 (three) times a week. Mon, Wed, Fri     No current facility-administered medications for this visit.  SUMMARY OF ONCOLOGIC HISTORY: Oncology History Overview Note  Del 13q   CLL (chronic lymphocytic leukemia) ([place])  02/09/2013 Pathology Results   Peripheral Blood Flow Cytometry - FINDINGS CONSISTENT WITH CHRONIC LYMPHOCYTIC LEUKEMIA   09/02/2013 Pathology Results   FISH positive for deletion  13 q   05/22/2017 Imaging   Worsening mesenteric and retroperitoneal adenopathy concerning for worsening lymphoproliferative disorder/lymphoma.   Small cysts in the liver and kidneys are stable.   Prior cholecystectomy.   Aortic atherosclerosis.   Left colonic diverticulosis without active diverticulitis.   08/11/2017 Imaging   Chest Impression:  1. Mild mediastinal and hilar lymphadenopathy. 2. Clusters small axial lymph nodes.  Abdomen / Pelvis Impression:  1. Mild periaortic retroperitoneal adenopathy and moderate central mesenteric adenopathy not changed comparison CT. 2. Mild iliac adenopathy 3. Normal volume spleen. 4. No explanation for RIGHT lower quadrant pain. Cluster of lymph nodes in the ileocecal mesenteries similar to prior.   08/12/2017 Procedure   Placement of single lumen port a cath via right internal jugular vein. The catheter tip lies at the cavo-atrial junction. A power injectable port a cath was placed and is ready for immediate use.   08/14/2017 - 11/14/2017 Chemotherapy   She received Bendamustine and Rituxan   11/12/2017 Imaging   1. Continued decrease in mediastinal and hilar lymph nodes with interval resolution of the abdominal and pelvic lymphadenopathy seen on the prior study. There is no lymphadenopathy in the chest, abdomen, or pelvis by CT size criteria on today's study. No new or progressive interval findings. 2.  Aortic Atherosclerois (ICD10-170.0) 3. Hepatic and renal cysts, stable.   12/18/2017 Adverse Reaction   We are unable to resume cycle 5 of chemotherapy due to prolonged pancytopenia   01/29/2018 Procedure   Successful right IJ vein Port-A-Cath explant.     PHYSICAL EXAMINATION: ECOG PERFORMANCE STATUS: 0 - Asymptomatic  Vitals:   05/30/22 0907  BP: 136/61  Pulse: 60  Resp: 18  SpO2: 95%   Filed Weights   05/30/22 0907  Weight: 142 lb 12.8 oz (64.8 kg)    GENERAL:alert, no distress and comfortable SKIN: skin color, texture,  turgor are normal, no rashes or significant lesions EYES: normal, Conjunctiva are pink and non-injected, sclera clear OROPHARYNX:no exudate, no erythema and lips, buccal mucosa, and tongue normal  NECK: supple, thyroid normal size, non-tender, without nodularity LYMPH:  no palpable lymphadenopathy in the cervical, axillary or inguinal LUNGS: clear to auscultation and percussion with normal breathing effort HEART: regular rate & rhythm and no murmurs and no lower extremity edema ABDOMEN:abdomen soft, non-tender and normal bowel sounds Musculoskeletal:no cyanosis of digits and no clubbing  NEURO: alert & oriented x 3 with fluent speech, no focal motor/sensory deficits  LABORATORY DATA:  I have reviewed the data as listed    Component Value Date/Time   NA 142 01/12/2019 0938   NA 141 11/13/2017 0753   K 4.1 01/12/2019 0938   K 4.1 11/13/2017 0753   CL 105 01/12/2019 0938   CL 104 02/08/2013 1454   CO2 28 01/12/2019 0938   CO2 27 11/13/2017 0753   GLUCOSE 113 (H) 01/12/2019 0938   GLUCOSE 113 11/13/2017 0753   GLUCOSE 103 (H) 02/08/2013 1454   BUN 14 01/12/2019 0938   BUN 11.6 11/13/2017 0753   CREATININE 0.98 01/12/2019 0938   CREATININE 0.8 11/13/2017 0753   CALCIUM 9.3 01/12/2019 0938   CALCIUM 9.3 11/13/2017 0753   PROT 6.7 01/12/2019 0938   PROT 6.2 (L) 11/13/2017 0630  ALBUMIN 4.0 01/12/2019 0938   ALBUMIN 3.3 (L) 11/13/2017 0753   AST 21 01/12/2019 0938   AST 24 11/13/2017 0753   ALT 25 01/12/2019 0938   ALT 24 11/13/2017 0753   ALKPHOS 67 01/12/2019 0938   ALKPHOS 83 11/13/2017 0753   BILITOT 0.7 01/12/2019 0938   BILITOT 0.38 11/13/2017 0753   GFRNONAA 57 (L) 01/12/2019 0938   GFRAA >60 01/12/2019 0938    No results found for: "SPEP", "UPEP"  Lab Results  Component Value Date   WBC 17.0 (H) 05/30/2022   NEUTROABS PENDING 05/30/2022   HGB 13.3 05/30/2022   HCT 39.1 05/30/2022   MCV 80.5 05/30/2022   PLT 162 05/30/2022      Chemistry      Component  Value Date/Time   NA 142 01/12/2019 0938   NA 141 11/13/2017 0753   K 4.1 01/12/2019 0938   K 4.1 11/13/2017 0753   CL 105 01/12/2019 0938   CL 104 02/08/2013 1454   CO2 28 01/12/2019 0938   CO2 27 11/13/2017 0753   BUN 14 01/12/2019 0938   BUN 11.6 11/13/2017 0753   CREATININE 0.98 01/12/2019 0938   CREATININE 0.8 11/13/2017 0753      Component Value Date/Time   CALCIUM 9.3 01/12/2019 0938   CALCIUM 9.3 11/13/2017 0753   ALKPHOS 67 01/12/2019 0938   ALKPHOS 83 11/13/2017 0753   AST 21 01/12/2019 0938   AST 24 11/13/2017 0753   ALT 25 01/12/2019 0938   ALT 24 11/13/2017 0753   BILITOT 0.7 01/12/2019 0938   BILITOT 0.38 11/13/2017 0753

## 2022-05-30 NOTE — Assessment & Plan Note (Signed)
She has recurrent leukocytosis, likely indicated for relapse of CLL She is not symptomatic and has no signs of lymphadenopathy I recommend seeing her back in about 4 months for further follow-up and she is in agreement She is educated to watch out for signs and symptoms of lymphadenopathy, abnormal weight loss or night sweats

## 2022-06-19 DIAGNOSIS — L728 Other follicular cysts of the skin and subcutaneous tissue: Secondary | ICD-10-CM | POA: Diagnosis not present

## 2022-06-19 DIAGNOSIS — L723 Sebaceous cyst: Secondary | ICD-10-CM | POA: Diagnosis not present

## 2022-07-01 NOTE — Progress Notes (Signed)
[facility] Note  07/02/2022    CHIEF COMPLAINT Patient presents for Retina Follow Up  HISTORY OF PRESENT ILLNESS: Amanda [name] is a 76 y.o. female who presents to the clinic today for:  HPI     Retina Follow Up   Patient presents with  Wet AMD.  In left eye.  Severity is moderate.  Duration of 5 weeks.  Since onset it is stable.  I, the attending physician,  performed the HPI with the patient and updated documentation appropriately.        Comments   Pt here for 5 wk ret f/u exu ARMD OS. Pt states VA the same, no changes.       Last edited by Bernarda Caffey, MD on 07/02/2022  4:04 PM.    Pt states vision is stable   Referring physician: Shirleen Schirmer, PA-C [facility] [address] [place],  [place] [address]  HISTORICAL INFORMATION:  Selected notes from the MEDICAL RECORD NUMBER Referred by Shirleen Schirmer, PA on 11.16.21 for eval of dry to wet AMD conversion OS.   CURRENT MEDICATIONS: No current outpatient medications on file. (Ophthalmic Drugs)   No current facility-administered medications for this visit. (Ophthalmic Drugs)   Current Outpatient Medications (Other)  Medication Sig   acetaminophen (TYLENOL) 500 MG tablet Take 500 mg by mouth every 6 (six) hours as needed for pain.   amLODipine (NORVASC) 5 MG tablet Take 5 mg by mouth daily.   atorvastatin (LIPITOR) 40 MG tablet Take 1 tablet by mouth daily.   Calcium Carb-Cholecalciferol (CALCIUM + D3) 600-200 MG-UNIT TABS Take 1 tablet by mouth 3 (three) times a week.    fluticasone (FLONASE) 50 MCG/ACT nasal spray Place 2 sprays into the nose daily.   folic acid (FOLVITE) 1 MG tablet Take 1 mg by mouth daily.   hydrochlorothiazide (HYDRODIURIL) 12.5 MG tablet Take 12.5 mg by mouth daily.   ibandronate (BONIVA) 150 MG tablet Take 150 mg by mouth every 30 (thirty) days.   metoprolol (LOPRESSOR) 50 MG tablet Take 50 mg by mouth 2 (two) times daily.   Multiple  Vitamin (MULTIVITAMIN) tablet Take 1 tablet by mouth daily.   Multiple Vitamins-Minerals (EYE VITAMINS & MINERALS) TABS Take by mouth.   potassium chloride SA (K-DUR,KLOR-CON) 20 MEQ tablet Take 20 mEq by mouth 3 (three) times a week. Mon, Wed, Fri   No current facility-administered medications for this visit. (Other)   REVIEW OF SYSTEMS: ROS   Positive for: Cardiovascular, Eyes Negative for: Constitutional, Gastrointestinal, Neurological, Skin, Genitourinary, Musculoskeletal, HENT, Endocrine, Respiratory, Psychiatric, Allergic/Imm, Heme/Lymph Last edited by Kingsley Spittle, COT on 07/02/2022  2:19 PM.     ALLERGIES Allergies  Allergen Reactions   Codeine Nausea And Vomiting   Demerol [Meperidine] Nausea And Vomiting   Morphine And Related Nausea And Vomiting   Ciprofloxacin Rash   Sulfa Antibiotics Rash   PAST MEDICAL HISTORY Past Medical History:  Diagnosis Date   A-fib ([place])    Adenopathy    Atrial fibrillation ([place])    CLL (chronic lymphocytic leukemia) ([place]) 08/31/2013   HTN (hypertension)    Hyperlipidemia    Hypertensive retinopathy    Lymphocytosis    Macular degeneration    OU   Past Surgical History:  Procedure Laterality Date   ABDOMINAL HYSTERECTOMY     endometriosis, fibroid   APPENDECTOMY     BREAST BIOPSY     benign cyst.   CATARACT EXTRACTION Bilateral  2019   CHOLECYSTECTOMY     COLONOSCOPY  11/2012   Per Dr. Cristina Gong.  neg.    EYE SURGERY     IR FLUORO GUIDE PORT INSERTION RIGHT  08/12/2017   IR REMOVAL TUN ACCESS W/ PORT W/O FL MOD SED  01/29/2018   IR US GUIDE VASC ACCESS RIGHT  08/12/2017   SQUAMOUS CELL CARCINOMA EXCISION  12/28/2020   Excised from top of head   THUMB ARTHROSCOPY     TOTAL HIP ARTHROPLASTY  2010   FAMILY HISTORY Family History  Problem Relation Age of Onset   Renal Disease Mother    Cancer Mother        colon cancer   Cancer Father        colon cancer   SOCIAL HISTORY Social History   Tobacco Use   Smoking  status: Never   Smokeless tobacco: Never  Vaping Use   Vaping Use: Never used  Substance Use Topics   Alcohol use: No   Drug use: No       OPHTHALMIC EXAM: Base Eye Exam     Visual Acuity (Snellen - Linear)       Right Left   Dist cc 20/25 -2 20/20 -1   Dist ph cc NI     Correction: Glasses         Tonometry (Tonopen, 2:23 PM)       Right Left   Pressure 14 15         Pupils       Dark Light Shape React APD   Right 3 2 Round Brisk None   Left 3 2 Round Brisk None         Visual Fields (Counting fingers)       Left Right    Full Full         Extraocular Movement       Right Left    Full, Ortho Full, Ortho         Neuro/Psych     Oriented x3: Yes   Mood/Affect: Normal         Dilation     Both eyes: 1.0% Mydriacyl, 2.5% Phenylephrine @ 2:24 PM           Slit Lamp and Fundus Exam     Slit Lamp Exam       Right Left   Lids/Lashes Dermatochalasis - upper lid, mild Meibomian gland dysfunction Dermatochalasis - upper lid, mild Meibomian gland dysfunction   Conjunctiva/Sclera White and quiet White and quiet   Cornea Trace Debris in tear film, well healed temporal cataract wounds, 1+ pigmented Guttata, trace PEE well healed temporal cataract wounds, 2+ Punctate epithelial erosions   Anterior Chamber Deep and quiet Deep and quiet   Iris Round and dilated Round and dilated   Lens PC IOL in good position, trace PCO PC IOL in good position   Anterior Vitreous Trace Vitreous syneresis, Posterior vitreous detachment Trace Vitreous syneresis, Posterior vitreous detachment         Fundus Exam       Right Left   Disc Compact, Pallor, Sharp rim, mild PPA Compact, Pink and Sharp, temporal PPP   C/D Ratio 0.1 0.2   Macula Blunted foveal reflex, Drusen, RPE mottling, clumping and early atrophy, +PED's -- stably improved, SRH / edema temporal macula - stably improved Blunted foveal reflex, +CNV, Drusen, RPE mottling and clumping; shallow SRF --  persistent, no heme   Vessels attenuated, Tortuous attenuated, mild tortuousity  Periphery Attached; mild reticular degeneration, no heme Attached, reticular degeneration, No heme           Refraction     Wearing Rx       Sphere Cylinder Axis Add   Right -0.25 +0.75 165 +2.50   Left -0.50 +0.50 012 +2.50    Type: PAL           IMAGING AND PROCEDURES  Imaging and Procedures for 07/02/2022  OCT, Retina - OU - Both Eyes       Right Eye Quality was good. Central Foveal Thickness: 266. Progression has been stable. Findings include normal foveal contour, no IRF, no SRF, retinal drusen , subretinal hyper-reflective material, intraretinal hyper-reflective material, pigment epithelial detachment, subretinal fluid, outer retinal atrophy (stable improvement in low PED temporal macula).   Left Eye Quality was good. Central Foveal Thickness: 258. Progression has been stable. Findings include no IRF, abnormal foveal contour, retinal drusen , subretinal hyper-reflective material, choroidal neovascular membrane, pigment epithelial detachment, subretinal fluid, outer retinal atrophy (persistent SRF overlying stable PED).   Notes *Images captured and stored on drive  Diagnosis / Impression:  OD: exu ARMD -- stable improvement in low PED temporal macula  OS: exu ARMD -- persistent SRF overlying stable PED  Clinical management:  See below  Abbreviations: NFP - Normal foveal profile. CME - cystoid macular edema. PED - pigment epithelial detachment. IRF - intraretinal fluid. SRF - subretinal fluid. EZ - ellipsoid zone. ERM - epiretinal membrane. ORA - outer retinal atrophy. ORT - outer retinal tubulation. SRHM - subretinal hyper-reflective material. IRHM - intraretinal hyper-reflective material      Intravitreal Injection, Pharmacologic Agent - OS - Left Eye       Time Out 07/02/2022. 3:01 PM. Confirmed correct patient, procedure, site, and patient consented.   Anesthesia Topical  anesthesia was used. Anesthetic medications included Lidocaine 2%, Proparacaine 0.5%.   Procedure Preparation included 5% betadine to ocular surface, eyelid speculum. A (32g) needle was used.   Injection: 6 mg faricimab-svoa 6 MG/0.05ML   Route: Intravitreal, Site: Left Eye   NDC: 81157-262-03, Lot: [id number], Expiration date: 05/10/2022, Waste: 0 mL   Post-op Post injection exam found visual acuity of at least counting fingers. The patient tolerated the procedure well. There were no complications. The patient received written and verbal post procedure care education. Post injection medications were not given.      Intravitreal Injection, Pharmacologic Agent - OD - Right Eye       Time Out 07/02/2022. 3:03 PM. Confirmed correct patient, procedure, site, and patient consented.   Anesthesia Topical anesthesia was used. Anesthetic medications included Lidocaine 2%, Proparacaine 0.5%.   Procedure Preparation included 5% betadine to ocular surface, eyelid speculum. A (32g) needle was used.   Injection: 2 mg aflibercept 2 MG/0.05ML   Route: Intravitreal, Site: Right Eye   NDC: A3590391, Lot: [id number], Expiration date: 03/11/2023, Waste: 0 mL   Post-op Post injection exam found visual acuity of at least counting fingers. The patient tolerated the procedure well. There were no complications. The patient received written and verbal post procedure care education. Post injection medications were not given.             ASSESSMENT/PLAN:    ICD-10-CM   1. Exudative age-related macular degeneration of left eye with active choroidal neovascularization (HCC)  H35.3221 OCT, Retina - OU - Both Eyes    Intravitreal Injection, Pharmacologic Agent - OS - Left Eye    faricimab-svoa (VABYSMO) 66m/0.05mL intravitreal injection  CANCELED: Intravitreal Injection, Pharmacologic Agent - OS - Left Eye    2. Exudative age-related macular degeneration of right eye with active choroidal  neovascularization (HCC)  H35.3211 Intravitreal Injection, Pharmacologic Agent - OD - Right Eye    aflibercept (EYLEA) SOLN 2 mg    3. Essential hypertension  I10     4. Hypertensive retinopathy of both eyes  H35.033     5. Pseudophakia, both eyes  Z96.1       1. Exudative age related macular degeneration, OS  - [facility] pt with known history of nonexudative ARMD OU  - acute "haze over vision" OS -- onset, Friday 11.12.21 -- initial exam with central subretinal heme  - s/p IVA OS #1 (11.17.21), #2 (12.15.21), #3 (01.12.22), #4 (02.09.22), #5 (03.16.22), #6 (04.20.22), #7 (06.01.22), #8 (06.29.22)--IVA resistance  - s/p IVE OS #1 (07.27.22), #2 (08.24.22), #3 (09.21.22), #4 (10.19.22), #5 (11.16.22), #6 (12.16.22), #7 (01.18.23), #8 (02.15.23)--IVE resistance  - s/p IVV OS #1 SAMPLE (03.15.23), #2 (04.12.23), #3 (05.10.23), #4 (06.07.23), #5 (07.19.23)  - BCVA 20/20 improved from 20/25  - exam and OCT OS shows: persistent SRF overlying stable PED at 5 weeks  - recommend IVV OS #6 today, 08.22.23 with follow up at 5 weeks - pt wishes to be treated with IVV OS - RBA of procedure discussed, questions answered - IVV informed consent obtained and signed 03.15.23 (OS) - see procedure note  - f/u in 5 wks -- DFE/OCT, possible injection  2. Exudative age related macular degeneration OD  - conversion to exudative ARMD noted on 03.15.23 exam  - exam with new SRH temporal macula  - s/p IVE OD #1 (03.15.23), #2 (04.12.23), #3 (05.10.23), #4 (06.07.23), #5 (07.19.23)  - BCVA 20/25 OD -- stable  - OCT OD shows stable improvement in low PED temporal macula at 5 weeks - recommend IVE OD #5 today, 07.19.23 with follow up at 5 weeks  - pt wishes to proceed with injection  - RBA of procedure discussed, questions answered - IVE informed consent obtained and signed, 03.15.23 (OD) - see procedure note  - f/u 5 weeks  3,4. Hypertensive retinopathy OU - discussed importance of tight BP  control - monitor     5. Pseudophakia OU  - s/p CE/IOL OU  - IOLs in good position, doing well  - monitor   Ophthalmic Meds Ordered this visit:  Meds ordered this encounter  Medications   faricimab-svoa (VABYSMO) 87m/0.05mL intravitreal injection   aflibercept (EYLEA) SOLN 2 mg     Return in about 5 weeks (around 08/06/2022) for f/u exu ARMD OU, DFE, OCT.  There are no Patient Instructions on file for this visit.  This document serves as a record of services personally performed by BGardiner Sleeper MD, PhD. It was created on their behalf by ASan Jetty BOwens Shark OA an ophthalmic technician. The creation of this record is the provider's dictation and/or activities during the visit.    Electronically signed by: ASan Jetty BOwens Shark O[place]08.21.2023 1:30 AM  BGardiner Sleeper M.D., Ph.D. Diseases & Surgery of the Retina and Vitreous [facility] I have reviewed the above documentation for accuracy and completeness, and I agree with the above. BGardiner Sleeper M.D., Ph.D. 07/03/22 1:32 AM    Abbreviations: M myopia (nearsighted); A astigmatism; H hyperopia (farsighted); P presbyopia; Mrx spectacle prescription;  CTL contact lenses; OD right eye; OS left eye; OU both eyes  XT exotropia; ET esotropia; PEK punctate  epithelial keratitis; PEE punctate epithelial erosions; DES dry eye syndrome; MGD meibomian gland dysfunction; ATs artificial tears; PFAT's preservative free artificial tears; [place] nuclear sclerotic cataract; PSC posterior subcapsular cataract; ERM epi-retinal membrane; PVD posterior vitreous detachment; RD retinal detachment; DM diabetes mellitus; DR diabetic retinopathy; NPDR non-proliferative diabetic retinopathy; PDR proliferative diabetic retinopathy; CSME clinically significant macular edema; DME diabetic macular edema; dbh dot blot hemorrhages; CWS cotton wool spot; POAG primary open angle glaucoma; C/D cup-to-disc ratio; HVF humphrey visual field; GVF goldmann  visual field; OCT optical coherence tomography; IOP intraocular pressure; BRVO Branch retinal vein occlusion; CRVO central retinal vein occlusion; CRAO central retinal artery occlusion; BRAO branch retinal artery occlusion; RT retinal tear; SB scleral buckle; PPV pars plana vitrectomy; VH Vitreous hemorrhage; PRP panretinal laser photocoagulation; IVK intravitreal kenalog; VMT vitreomacular traction; MH Macular hole;  NVD neovascularization of the disc; NVE neovascularization elsewhere; AREDS age related eye disease study; ARMD age related macular degeneration; POAG primary open angle glaucoma; EBMD epithelial/anterior basement membrane dystrophy; ACIOL anterior chamber intraocular lens; IOL intraocular lens; PCIOL posterior chamber intraocular lens; Phaco/IOL phacoemulsification with intraocular lens placement; [place] photorefractive keratectomy; LASIK laser assisted in situ keratomileusis; HTN hypertension; DM diabetes mellitus; COPD chronic obstructive pulmonary disease

## 2022-07-02 ENCOUNTER — Encounter (INDEPENDENT_AMBULATORY_CARE_PROVIDER_SITE_OTHER): Payer: Self-pay | Admitting: Ophthalmology

## 2022-07-02 ENCOUNTER — Ambulatory Visit (INDEPENDENT_AMBULATORY_CARE_PROVIDER_SITE_OTHER): Payer: Medicare Other | Admitting: Ophthalmology

## 2022-07-02 DIAGNOSIS — H353221 Exudative age-related macular degeneration, left eye, with active choroidal neovascularization: Secondary | ICD-10-CM

## 2022-07-02 DIAGNOSIS — I10 Essential (primary) hypertension: Secondary | ICD-10-CM

## 2022-07-02 DIAGNOSIS — H353231 Exudative age-related macular degeneration, bilateral, with active choroidal neovascularization: Secondary | ICD-10-CM | POA: Diagnosis not present

## 2022-07-02 DIAGNOSIS — H35033 Hypertensive retinopathy, bilateral: Secondary | ICD-10-CM

## 2022-07-02 DIAGNOSIS — H353211 Exudative age-related macular degeneration, right eye, with active choroidal neovascularization: Secondary | ICD-10-CM

## 2022-07-02 DIAGNOSIS — Z961 Presence of intraocular lens: Secondary | ICD-10-CM

## 2022-07-02 MED ORDER — AFLIBERCEPT 2MG/0.05ML IZ SOLN FOR KALEIDOSCOPE
2.0000 mg | INTRAVITREAL | Status: AC | PRN
  Administered 2022-07-02: 2 mg via INTRAVITREAL

## 2022-07-02 MED ORDER — FARICIMAB-SVOA 6 MG/0.05ML IZ SOLN
6.0000 mg | INTRAVITREAL | Status: AC | PRN
  Administered 2022-07-02: 6 mg via INTRAVITREAL

## 2022-07-10 DIAGNOSIS — C9191 Lymphoid leukemia, unspecified, in remission: Secondary | ICD-10-CM | POA: Diagnosis not present

## 2022-07-10 DIAGNOSIS — I10 Essential (primary) hypertension: Secondary | ICD-10-CM | POA: Diagnosis not present

## 2022-07-18 DIAGNOSIS — J309 Allergic rhinitis, unspecified: Secondary | ICD-10-CM | POA: Diagnosis not present

## 2022-07-18 DIAGNOSIS — M85852 Other specified disorders of bone density and structure, left thigh: Secondary | ICD-10-CM | POA: Diagnosis not present

## 2022-07-18 DIAGNOSIS — Z8679 Personal history of other diseases of the circulatory system: Secondary | ICD-10-CM | POA: Diagnosis not present

## 2022-07-18 DIAGNOSIS — Z23 Encounter for immunization: Secondary | ICD-10-CM | POA: Diagnosis not present

## 2022-07-18 DIAGNOSIS — E782 Mixed hyperlipidemia: Secondary | ICD-10-CM | POA: Diagnosis not present

## 2022-07-18 DIAGNOSIS — Z6825 Body mass index (BMI) 25.0-25.9, adult: Secondary | ICD-10-CM | POA: Diagnosis not present

## 2022-07-18 DIAGNOSIS — I10 Essential (primary) hypertension: Secondary | ICD-10-CM | POA: Diagnosis not present

## 2022-07-19 ENCOUNTER — Other Ambulatory Visit: Payer: Self-pay | Admitting: Family Medicine

## 2022-07-19 DIAGNOSIS — M85852 Other specified disorders of bone density and structure, left thigh: Secondary | ICD-10-CM

## 2022-08-05 NOTE — Progress Notes (Shared)
[facility] Note  08/07/2022    CHIEF COMPLAINT Patient presents for Retina Follow Up  HISTORY OF PRESENT ILLNESS: Amanda [name] is a 76 y.o. female who presents to the clinic today for:  HPI     Retina Follow Up   Patient presents with  Wet AMD.  In left eye.  Severity is moderate.  Duration of 5 weeks.  Since onset it is stable.  I, the attending physician,  performed the HPI with the patient and updated documentation appropriately.        Comments   Pt here for 5 wk ret f/u exu ARMD OS. Pt states VA is the same, no changes.       Last edited by Bernarda Caffey, MD on 08/07/2022  4:26 PM.      Referring physician: Shirleen Schirmer, PA-C [facility] [address] [place],  [place] [address]  HISTORICAL INFORMATION:  Selected notes from the MEDICAL RECORD NUMBER Referred by Shirleen Schirmer, PA on 11.16.21 for eval of dry to wet AMD conversion OS.   CURRENT MEDICATIONS: No current outpatient medications on file. (Ophthalmic Drugs)   No current facility-administered medications for this visit. (Ophthalmic Drugs)   Current Outpatient Medications (Other)  Medication Sig   acetaminophen (TYLENOL) 500 MG tablet Take 500 mg by mouth every 6 (six) hours as needed for pain.   amLODipine (NORVASC) 5 MG tablet Take 5 mg by mouth daily.   atorvastatin (LIPITOR) 40 MG tablet Take 1 tablet by mouth daily.   Calcium Carb-Cholecalciferol (CALCIUM + D3) 600-200 MG-UNIT TABS Take 1 tablet by mouth 3 (three) times a week.    fluticasone (FLONASE) 50 MCG/ACT nasal spray Place 2 sprays into the nose daily.   folic acid (FOLVITE) 1 MG tablet Take 1 mg by mouth daily.   hydrochlorothiazide (HYDRODIURIL) 12.5 MG tablet Take 12.5 mg by mouth daily.   ibandronate (BONIVA) 150 MG tablet Take 150 mg by mouth every 30 (thirty) days.   metoprolol (LOPRESSOR) 50 MG tablet Take 50 mg by mouth 2 (two) times daily.   Multiple Vitamin (MULTIVITAMIN)  tablet Take 1 tablet by mouth daily.   Multiple Vitamins-Minerals (EYE VITAMINS & MINERALS) TABS Take by mouth.   potassium chloride SA (K-DUR,KLOR-CON) 20 MEQ tablet Take 20 mEq by mouth 3 (three) times a week. Mon, Wed, Fri   No current facility-administered medications for this visit. (Other)   REVIEW OF SYSTEMS: ROS   Positive for: Cardiovascular, Eyes Negative for: Constitutional, Gastrointestinal, Neurological, Skin, Genitourinary, Musculoskeletal, HENT, Endocrine, Respiratory, Psychiatric, Allergic/Imm, Heme/Lymph Last edited by Kingsley Spittle, COT on 08/07/2022  2:24 PM.      ALLERGIES Allergies  Allergen Reactions   Codeine Nausea And Vomiting   Demerol [Meperidine] Nausea And Vomiting   Morphine And Related Nausea And Vomiting   Ciprofloxacin Rash   Sulfa Antibiotics Rash   PAST MEDICAL HISTORY Past Medical History:  Diagnosis Date   A-fib ([place])    Adenopathy    Atrial fibrillation ([place])    CLL (chronic lymphocytic leukemia) ([place]) 08/31/2013   HTN (hypertension)    Hyperlipidemia    Hypertensive retinopathy    Lymphocytosis    Macular degeneration    OU   Past Surgical History:  Procedure Laterality Date   ABDOMINAL HYSTERECTOMY     endometriosis, fibroid   APPENDECTOMY     BREAST BIOPSY     benign cyst.   CATARACT EXTRACTION Bilateral 2019  CHOLECYSTECTOMY     COLONOSCOPY  11/2012   Per Dr. Cristina Gong.  neg.    EYE SURGERY     IR FLUORO GUIDE PORT INSERTION RIGHT  08/12/2017   IR REMOVAL TUN ACCESS W/ PORT W/O FL MOD SED  01/29/2018   IR US GUIDE VASC ACCESS RIGHT  08/12/2017   SQUAMOUS CELL CARCINOMA EXCISION  12/28/2020   Excised from top of head   THUMB ARTHROSCOPY     TOTAL HIP ARTHROPLASTY  2010   FAMILY HISTORY Family History  Problem Relation Age of Onset   Renal Disease Mother    Cancer Mother        colon cancer   Cancer Father        colon cancer   SOCIAL HISTORY Social History   Tobacco Use   Smoking status: Never    Smokeless tobacco: Never  Vaping Use   Vaping Use: Never used  Substance Use Topics   Alcohol use: No   Drug use: No       OPHTHALMIC EXAM: Base Eye Exam     Visual Acuity (Snellen - Linear)       Right Left   Dist cc 20/25 +2 20/20 -3   Dist ph cc NI     Correction: Glasses         Tonometry (Tonopen, 2:30 PM)       Right Left   Pressure 14 10         Pupils       Pupils Dark Light Shape React APD   Right PERRL 3 2 Round Brisk None   Left PERRL 3 2 Round Minimal None         Visual Fields (Counting fingers)       Left Right    Full Full         Extraocular Movement       Right Left    Full, Ortho Full, Ortho         Neuro/Psych     Oriented x3: Yes   Mood/Affect: Normal         Dilation     Both eyes: 1.0% Mydriacyl, 2.5% Phenylephrine @ 2:30 PM           Slit Lamp and Fundus Exam     Slit Lamp Exam       Right Left   Lids/Lashes Dermatochalasis - upper lid, mild Meibomian gland dysfunction Dermatochalasis - upper lid, mild Meibomian gland dysfunction   Conjunctiva/Sclera White and quiet White and quiet   Cornea Trace Debris in tear film, well healed temporal cataract wounds, 1+ pigmented Guttata, trace PEE well healed temporal cataract wounds, 2+ Punctate epithelial erosions   Anterior Chamber Deep and quiet Deep and quiet   Iris Round and dilated Round and dilated   Lens PC IOL in good position, trace PCO PC IOL in good position   Anterior Vitreous Trace Vitreous syneresis, Posterior vitreous detachment Trace Vitreous syneresis, Posterior vitreous detachment         Fundus Exam       Right Left   Disc Compact, Pallor, Sharp rim, mild PPA Compact, Pink and Sharp, temporal PPP   C/D Ratio 0.1 0.2   Macula Blunted foveal reflex, Drusen, RPE mottling, clumping and early atrophy, shallow PED -- stably improved, SRH / edema temporal macula - stably improved Blunted foveal reflex, +CNV, Drusen, RPE mottling and clumping;  shallow SRF -- improved / no heme, no heme   Vessels attenuated, Tortuous  attenuated, mild tortuousity   Periphery Attached; mild reticular degeneration, no heme Attached, reticular degeneration, No heme           Refraction     Wearing Rx       Sphere Cylinder Axis Add   Right -0.25 +0.75 165 +2.50   Left -0.50 +0.50 012 +2.50    Type: PAL           IMAGING AND PROCEDURES  Imaging and Procedures for 08/07/2022  OCT, Retina - OU - Both Eyes       Right Eye Quality was good. Central Foveal Thickness: 269. Progression has been stable. Findings include normal foveal contour, no IRF, no SRF, retinal drusen , subretinal hyper-reflective material, intraretinal hyper-reflective material, pigment epithelial detachment, outer retinal atrophy (stable improvement in low PED temporal macula, no IRF/SRF).   Left Eye Quality was good. Central Foveal Thickness: 286. Progression has improved. Findings include no IRF, no SRF, abnormal foveal contour, retinal drusen , subretinal hyper-reflective material, choroidal neovascular membrane, pigment epithelial detachment, outer retinal atrophy (Interval improvement / resolution of SRF overlying persistent PED).   Notes *Images captured and stored on drive  Diagnosis / Impression:  OD: exu ARMD -- stable improvement in low PED temporal macula, no IRF/SRF OS: exu ARMD -- Interval improvement / resolution of SRF overlying persistent PED  Clinical management:  See below  Abbreviations: NFP - Normal foveal profile. CME - cystoid macular edema. PED - pigment epithelial detachment. IRF - intraretinal fluid. SRF - subretinal fluid. EZ - ellipsoid zone. ERM - epiretinal membrane. ORA - outer retinal atrophy. ORT - outer retinal tubulation. SRHM - subretinal hyper-reflective material. IRHM - intraretinal hyper-reflective material      Intravitreal Injection, Pharmacologic Agent - OD - Right Eye       Time Out 08/07/2022. 3:22 PM. Confirmed  correct patient, procedure, site, and patient consented.   Anesthesia Topical anesthesia was used. Anesthetic medications included Lidocaine 2%, Proparacaine 0.5%.   Procedure Preparation included 5% betadine to ocular surface, eyelid speculum. A (32g) needle was used.   Injection: 2 mg aflibercept 2 MG/0.05ML   Route: Intravitreal, Site: Right Eye   NDC: A3590391, Lot: [id number], Expiration date: 04/10/2023, Waste: 0 mL   Post-op Post injection exam found visual acuity of at least counting fingers. The patient tolerated the procedure well. There were no complications. The patient received written and verbal post procedure care education. Post injection medications were not given.      Intravitreal Injection, Pharmacologic Agent - OS - Left Eye       Time Out 08/07/2022. 3:23 PM. Confirmed correct patient, procedure, site, and patient consented.   Anesthesia Topical anesthesia was used. Anesthetic medications included Lidocaine 2%, Proparacaine 0.5%.   Procedure Preparation included 5% betadine to ocular surface, eyelid speculum. A (32g) needle was used.   Injection: 6 mg faricimab-svoa 6 MG/0.05ML   Route: Intravitreal, Site: Left Eye   NDC: S6832610, Lot: [id number], Expiration date: 06/09/2024, Waste: 0 mL   Post-op Post injection exam found visual acuity of at least counting fingers. The patient tolerated the procedure well. There were no complications. The patient received written and verbal post procedure care education. Post injection medications were not given.            ASSESSMENT/PLAN:    ICD-10-CM   1. Exudative age-related macular degeneration of left eye with active choroidal neovascularization (HCC)  H35.3221 OCT, Retina - OU - Both Eyes    Intravitreal Injection, Pharmacologic Agent - OS -  Left Eye    faricimab-svoa (VABYSMO) 64m/0.05mL intravitreal injection    2. Exudative age-related macular degeneration of right eye with active choroidal  neovascularization (HCC)  H35.3211 OCT, Retina - OU - Both Eyes    Intravitreal Injection, Pharmacologic Agent - OD - Right Eye    aflibercept (EYLEA) SOLN 2 mg    3. Essential hypertension  I10     4. Hypertensive retinopathy of both eyes  H35.033     5. Pseudophakia, both eyes  Z96.1      1. Exudative age related macular degeneration, OS  - [facility] pt with known history of nonexudative ARMD OU  - acute "haze over vision" OS -- onset, Friday 11.12.21 -- initial exam with central subretinal heme  - s/p IVA OS #1 (11.17.21), #2 (12.15.21), #3 (01.12.22), #4 (02.09.22), #5 (03.16.22), #6 (04.20.22), #7 (06.01.22), #8 (06.29.22)--IVA resistance  - s/p IVE OS #1 (07.27.22), #2 (08.24.22), #3 (09.21.22), #4 (10.19.22), #5 (11.16.22), #6 (12.16.22), #7 (01.18.23), #8 (02.15.23)--IVE resistance  - s/p IVV OS #1 SAMPLE (03.15.23), #2 (04.12.23), #3 (05.10.23), #4 (06.07.23), #5 (07.19.23), #6 (08.22.23)  - BCVA 20/20 OS -- stable  - exam and OCT OS shows interval improvement / resolution of SRF overlying persistent PED at 5 wks  - recommend IVV OS #7 today, 09.27.23 with follow up at 5 weeks again - pt wishes to be treated with IVV OS - RBA of procedure discussed, questions answered - IVV informed consent obtained and signed 03.15.23 (OS) - see procedure note   - f/u in 5 wks -- DFE/OCT, possible injection  2. Exudative age related macular degeneration OD  - conversion to exudative ARMD noted on 03.15.23 exam  - exam with new SRH temporal macula -- stably improved today  - s/p IVE OD #1 (03.15.23), #2 (04.12.23), #3 (05.10.23), #4 (06.07.23), #5 (07.19.23), #6 (08.19.23)  - BCVA 20/20 OD -- stable  - OCT OD shows OD: stable improvement in low PED temporal macula, no IRF/SRF at 5 wks - recommend IVE OD #7 today, 09.27.23, maintenance with follow up at 5 weeks  - pt wishes to proceed with injection  - RBA of procedure discussed, questions answered - IVE informed consent obtained and  signed, 03.15.23 (OD) - see procedure note  - f/u 5 weeks  3,4. Hypertensive retinopathy OU - discussed importance of tight BP control - monitor     5. Pseudophakia OU  - s/p CE/IOL OU  - IOLs in good position, doing well  - monitor   Ophthalmic Meds Ordered this visit:  Meds ordered this encounter  Medications   faricimab-svoa (VABYSMO) 666m0.05mL intravitreal injection   aflibercept (EYLEA) SOLN 2 mg     Return in about 5 weeks (around 09/11/2022) for f/u exu ARMD OS, DFE, OCT.  There are no Patient Instructions on file for this visit.  This document serves as a record of services personally performed by BrGardiner SleeperMD, PhD. It was created on their behalf by MaOrvan Falconeran ophthalmic technician. The creation of this record is the provider's dictation and/or activities during the visit.    Electronically signed by: MaOrvan FalconerOA, 08/07/22  4:36 PM  This document serves as a record of services personally performed by BrGardiner SleeperMD, PhD. It was created on their behalf by AmSan JettyBrOwens SharkOA an ophthalmic technician. The creation of this record is the provider's dictation and/or activities during the visit.    Electronically signed by: AmSan JettyBrOwens SharkOANew York9.27.2023 4:36  PM  Gardiner Sleeper, M.D., Ph.D. Diseases & Surgery of the Retina and Vitreous [facility]  I have reviewed the above documentation for accuracy and completeness, and I agree with the above. Gardiner Sleeper, M.D., Ph.D. 08/07/22 4:36 PM  Abbreviations: M myopia (nearsighted); A astigmatism; H hyperopia (farsighted); P presbyopia; Mrx spectacle prescription;  CTL contact lenses; OD right eye; OS left eye; OU both eyes  XT exotropia; ET esotropia; PEK punctate epithelial keratitis; PEE punctate epithelial erosions; DES dry eye syndrome; MGD meibomian gland dysfunction; ATs artificial tears; PFAT's preservative free artificial tears; [place] nuclear sclerotic cataract; PSC  posterior subcapsular cataract; ERM epi-retinal membrane; PVD posterior vitreous detachment; RD retinal detachment; DM diabetes mellitus; DR diabetic retinopathy; NPDR non-proliferative diabetic retinopathy; PDR proliferative diabetic retinopathy; CSME clinically significant macular edema; DME diabetic macular edema; dbh dot blot hemorrhages; CWS cotton wool spot; POAG primary open angle glaucoma; C/D cup-to-disc ratio; HVF humphrey visual field; GVF goldmann visual field; OCT optical coherence tomography; IOP intraocular pressure; BRVO Branch retinal vein occlusion; CRVO central retinal vein occlusion; CRAO central retinal artery occlusion; BRAO branch retinal artery occlusion; RT retinal tear; SB scleral buckle; PPV pars plana vitrectomy; VH Vitreous hemorrhage; PRP panretinal laser photocoagulation; IVK intravitreal kenalog; VMT vitreomacular traction; MH Macular hole;  NVD neovascularization of the disc; NVE neovascularization elsewhere; AREDS age related eye disease study; ARMD age related macular degeneration; POAG primary open angle glaucoma; EBMD epithelial/anterior basement membrane dystrophy; ACIOL anterior chamber intraocular lens; IOL intraocular lens; PCIOL posterior chamber intraocular lens; Phaco/IOL phacoemulsification with intraocular lens placement; Nelson photorefractive keratectomy; LASIK laser assisted in situ keratomileusis; HTN hypertension; DM diabetes mellitus; COPD chronic obstructive pulmonary disease

## 2022-08-07 ENCOUNTER — Encounter (INDEPENDENT_AMBULATORY_CARE_PROVIDER_SITE_OTHER): Payer: Self-pay | Admitting: Ophthalmology

## 2022-08-07 ENCOUNTER — Ambulatory Visit (INDEPENDENT_AMBULATORY_CARE_PROVIDER_SITE_OTHER): Payer: Medicare Other | Admitting: Ophthalmology

## 2022-08-07 DIAGNOSIS — H353231 Exudative age-related macular degeneration, bilateral, with active choroidal neovascularization: Secondary | ICD-10-CM

## 2022-08-07 DIAGNOSIS — Z961 Presence of intraocular lens: Secondary | ICD-10-CM | POA: Diagnosis not present

## 2022-08-07 DIAGNOSIS — H35033 Hypertensive retinopathy, bilateral: Secondary | ICD-10-CM | POA: Diagnosis not present

## 2022-08-07 DIAGNOSIS — H353221 Exudative age-related macular degeneration, left eye, with active choroidal neovascularization: Secondary | ICD-10-CM

## 2022-08-07 DIAGNOSIS — I10 Essential (primary) hypertension: Secondary | ICD-10-CM

## 2022-08-07 DIAGNOSIS — H353211 Exudative age-related macular degeneration, right eye, with active choroidal neovascularization: Secondary | ICD-10-CM

## 2022-08-07 DIAGNOSIS — H353132 Nonexudative age-related macular degeneration, bilateral, intermediate dry stage: Secondary | ICD-10-CM

## 2022-08-07 MED ORDER — AFLIBERCEPT 2MG/0.05ML IZ SOLN FOR KALEIDOSCOPE
2.0000 mg | INTRAVITREAL | Status: AC | PRN
  Administered 2022-08-07: 2 mg via INTRAVITREAL

## 2022-08-07 MED ORDER — FARICIMAB-SVOA 6 MG/0.05ML IZ SOLN
6.0000 mg | INTRAVITREAL | Status: AC | PRN
  Administered 2022-08-07: 6 mg via INTRAVITREAL

## 2022-08-28 DIAGNOSIS — L821 Other seborrheic keratosis: Secondary | ICD-10-CM | POA: Diagnosis not present

## 2022-08-28 DIAGNOSIS — L570 Actinic keratosis: Secondary | ICD-10-CM | POA: Diagnosis not present

## 2022-08-28 DIAGNOSIS — D225 Melanocytic nevi of trunk: Secondary | ICD-10-CM | POA: Diagnosis not present

## 2022-08-28 DIAGNOSIS — L565 Disseminated superficial actinic porokeratosis (DSAP): Secondary | ICD-10-CM | POA: Diagnosis not present

## 2022-08-28 DIAGNOSIS — Z85828 Personal history of other malignant neoplasm of skin: Secondary | ICD-10-CM | POA: Diagnosis not present

## 2022-08-28 DIAGNOSIS — L578 Other skin changes due to chronic exposure to nonionizing radiation: Secondary | ICD-10-CM | POA: Diagnosis not present

## 2022-09-05 NOTE — Progress Notes (Signed)
[facility] Note  09/11/2022    CHIEF COMPLAINT Patient presents for Retina Follow Up  HISTORY OF PRESENT ILLNESS: Amanda [name] is a 76 y.o. female who presents to the clinic today for:  HPI     Retina Follow Up   Patient presents with  Wet AMD.  In left eye.  This started 5 weeks ago.  I, the attending physician,  performed the HPI with the patient and updated documentation appropriately.        Comments   Patient here for 5 weeks retina follow up for exu ARMD OS. Patient states vision doing the same. No eye pain.       Last edited by Bernarda Caffey, MD on 09/11/2022  5:46 PM.       Referring physician: Shirleen Schirmer, PA-C [facility] [address] [place],  [place] [address]  HISTORICAL INFORMATION:  Selected notes from the MEDICAL RECORD NUMBER Referred by Shirleen Schirmer, PA on 11.16.21 for eval of dry to wet AMD conversion OS.   CURRENT MEDICATIONS: No current outpatient medications on file. (Ophthalmic Drugs)   No current facility-administered medications for this visit. (Ophthalmic Drugs)   Current Outpatient Medications (Other)  Medication Sig   acetaminophen (TYLENOL) 500 MG tablet Take 500 mg by mouth every 6 (six) hours as needed for pain.   amLODipine (NORVASC) 5 MG tablet Take 5 mg by mouth daily.   atorvastatin (LIPITOR) 40 MG tablet Take 1 tablet by mouth daily.   Calcium Carb-Cholecalciferol (CALCIUM + D3) 600-200 MG-UNIT TABS Take 1 tablet by mouth 3 (three) times a week.    fluticasone (FLONASE) 50 MCG/ACT nasal spray Place 2 sprays into the nose daily.   folic acid (FOLVITE) 1 MG tablet Take 1 mg by mouth daily.   hydrochlorothiazide (HYDRODIURIL) 12.5 MG tablet Take 12.5 mg by mouth daily.   ibandronate (BONIVA) 150 MG tablet Take 150 mg by mouth every 30 (thirty) days.   metoprolol (LOPRESSOR) 50 MG tablet Take 50 mg by mouth 2 (two) times daily.   Multiple Vitamin (MULTIVITAMIN) tablet  Take 1 tablet by mouth daily.   Multiple Vitamins-Minerals (EYE VITAMINS & MINERALS) TABS Take by mouth.   potassium chloride SA (K-DUR,KLOR-CON) 20 MEQ tablet Take 20 mEq by mouth 3 (three) times a week. Mon, Wed, Fri   No current facility-administered medications for this visit. (Other)   REVIEW OF SYSTEMS: ROS   Positive for: Cardiovascular, Eyes Negative for: Constitutional, Gastrointestinal, Neurological, Skin, Genitourinary, Musculoskeletal, HENT, Endocrine, Respiratory, Psychiatric, Allergic/Imm, Heme/Lymph Last edited by Theodore Demark, COA on 09/11/2022  2:34 PM.       ALLERGIES Allergies  Allergen Reactions   Codeine Nausea And Vomiting   Demerol [Meperidine] Nausea And Vomiting   Morphine And Related Nausea And Vomiting   Ciprofloxacin Rash   Sulfa Antibiotics Rash   PAST MEDICAL HISTORY Past Medical History:  Diagnosis Date   A-fib ([place])    Adenopathy    Atrial fibrillation ([place])    CLL (chronic lymphocytic leukemia) ([place]) 08/31/2013   HTN (hypertension)    Hyperlipidemia    Hypertensive retinopathy    Lymphocytosis    Macular degeneration    OU   Past Surgical History:  Procedure Laterality Date   ABDOMINAL HYSTERECTOMY     endometriosis, fibroid   APPENDECTOMY     BREAST BIOPSY     benign cyst.   CATARACT EXTRACTION Bilateral 2019   CHOLECYSTECTOMY  COLONOSCOPY  11/2012   Per Dr. Cristina Gong.  neg.    EYE SURGERY     IR FLUORO GUIDE PORT INSERTION RIGHT  08/12/2017   IR REMOVAL TUN ACCESS W/ PORT W/O FL MOD SED  01/29/2018   IR US GUIDE VASC ACCESS RIGHT  08/12/2017   SQUAMOUS CELL CARCINOMA EXCISION  12/28/2020   Excised from top of head   THUMB ARTHROSCOPY     TOTAL HIP ARTHROPLASTY  2010   FAMILY HISTORY Family History  Problem Relation Age of Onset   Renal Disease Mother    Cancer Mother        colon cancer   Cancer Father        colon cancer   SOCIAL HISTORY Social History   Tobacco Use   Smoking status: Never   Smokeless  tobacco: Never  Vaping Use   Vaping Use: Never used  Substance Use Topics   Alcohol use: No   Drug use: No       OPHTHALMIC EXAM: Base Eye Exam     Visual Acuity (Snellen - Linear)       Right Left   Dist cc 20/25 +2 20/20 -2   Dist ph cc NI     Correction: Glasses         Tonometry (Tonopen, 2:32 PM)       Right Left   Pressure 15 14         Pupils       Dark Light Shape React APD   Right 3 2 Round Brisk None   Left 3 2 Round Brisk None         Visual Fields       Left Right    Full Full         Extraocular Movement       Right Left    Full, Ortho Full, Ortho         Neuro/Psych     Oriented x3: Yes   Mood/Affect: Normal         Dilation     Both eyes: 1.0% Mydriacyl, 2.5% Phenylephrine @ 2:32 PM           Slit Lamp and Fundus Exam     Slit Lamp Exam       Right Left   Lids/Lashes Dermatochalasis - upper lid, mild Meibomian gland dysfunction Dermatochalasis - upper lid, mild Meibomian gland dysfunction   Conjunctiva/Sclera White and quiet White and quiet   Cornea Trace Debris in tear film, well healed temporal cataract wounds, 1+ pigmented Guttata, trace PEE well healed temporal cataract wounds, 2+ Punctate epithelial erosions   Anterior Chamber Deep and quiet Deep and quiet   Iris Round and dilated Round and dilated   Lens PC IOL in good position, trace PCO PC IOL in good position   Anterior Vitreous Trace Vitreous syneresis, Posterior vitreous detachment Trace Vitreous syneresis, Posterior vitreous detachment         Fundus Exam       Right Left   Disc Compact, trace pallor, Sharp rim, mild PPA Compact, Pink and Sharp, temporal PPP   C/D Ratio 0.1 0.2   Macula Blunted foveal reflex, Drusen, RPE mottling, clumping and early atrophy, shallow PED -- stably improved, SRH / edema temporal macula - stably improved Blunted foveal reflex, +CNV, Drusen, RPE mottling and clumping; shallow SRF -- stably improved, no heme   Vessels  attenuated, Tortuous attenuated, mild tortuousity   Periphery Attached; mild reticular degeneration, no  heme Attached, reticular degeneration, No heme           Refraction     Wearing Rx       Sphere Cylinder Axis Add   Right -0.25 +0.75 165 +2.50   Left -0.50 +0.50 012 +2.50    Type: PAL           IMAGING AND PROCEDURES  Imaging and Procedures for 09/11/2022  OCT, Retina - OU - Both Eyes       Right Eye Quality was good. Central Foveal Thickness: 268. Progression has been stable. Findings include normal foveal contour, no IRF, no SRF, retinal drusen , subretinal hyper-reflective material, intraretinal hyper-reflective material, pigment epithelial detachment, outer retinal atrophy (stable improvement in low PED temporal macula, no IRF/SRF).   Left Eye Quality was good. Central Foveal Thickness: 269. Progression has been stable. Findings include no IRF, no SRF, abnormal foveal contour, retinal drusen , subretinal hyper-reflective material, choroidal neovascular membrane, pigment epithelial detachment, outer retinal atrophy (Stable improvement / resolution of SRF overlying persistent PED).   Notes *Images captured and stored on drive  Diagnosis / Impression:  OD: exu ARMD -- stable improvement in low PED temporal macula, no IRF/SRF OS: exu ARMD -- stable improvement / resolution of SRF overlying persistent PED  Clinical management:  See below  Abbreviations: NFP - Normal foveal profile. CME - cystoid macular edema. PED - pigment epithelial detachment. IRF - intraretinal fluid. SRF - subretinal fluid. EZ - ellipsoid zone. ERM - epiretinal membrane. ORA - outer retinal atrophy. ORT - outer retinal tubulation. SRHM - subretinal hyper-reflective material. IRHM - intraretinal hyper-reflective material      Intravitreal Injection, Pharmacologic Agent - OD - Right Eye       Time Out 09/11/2022. 3:48 PM. Confirmed correct patient, procedure, site, and patient consented.    Anesthesia Topical anesthesia was used. Anesthetic medications included Lidocaine 2%, Proparacaine 0.5%.   Procedure Preparation included 5% betadine to ocular surface, eyelid speculum. A (32g) needle was used.   Injection: 2 mg aflibercept 2 MG/0.05ML   Route: Intravitreal, Site: Right Eye   NDC: A3590391, Lot: [id number], Expiration date: 12/11/2023, Waste: 0 mL   Post-op Post injection exam found visual acuity of at least counting fingers. The patient tolerated the procedure well. There were no complications. The patient received written and verbal post procedure care education. Post injection medications were not given.      Intravitreal Injection, Pharmacologic Agent - OS - Left Eye       Time Out 09/11/2022. 3:48 PM. Confirmed correct patient, procedure, site, and patient consented.   Anesthesia Topical anesthesia was used. Anesthetic medications included Lidocaine 2%, Proparacaine 0.5%.   Procedure Preparation included 5% betadine to ocular surface, eyelid speculum. A (32g) needle was used.   Injection: 6 mg faricimab-svoa 6 MG/0.05ML   Route: Intravitreal, Site: Left Eye   NDC: 27062-376-28, Lot: [id number], Expiration date: 06/10/2024, Waste: 0 mL   Post-op Post injection exam found visual acuity of at least counting fingers. The patient tolerated the procedure well. There were no complications. The patient received written and verbal post procedure care education. Post injection medications were not given.             ASSESSMENT/PLAN:    ICD-10-CM   1. Exudative age-related macular degeneration of left eye with active choroidal neovascularization (HCC)  H35.3221 OCT, Retina - OU - Both Eyes    Intravitreal Injection, Pharmacologic Agent - OS - Left Eye    faricimab-svoa (VABYSMO) '6mg'$ /0.67m  intravitreal injection    2. Exudative age-related macular degeneration of right eye with active choroidal neovascularization (HCC)  H35.3211 Intravitreal Injection,  Pharmacologic Agent - OD - Right Eye    aflibercept (EYLEA) SOLN 2 mg    3. Essential hypertension  I10     4. Hypertensive retinopathy of both eyes  H35.033     5. Pseudophakia, both eyes  Z96.1       1. Exudative age related macular degeneration, OS  - [facility] pt with known history of nonexudative ARMD OU  - acute "haze over vision" OS -- onset, Friday 11.12.21 -- initial exam with central subretinal heme  - s/p IVA OS #1 (11.17.21), #2 (12.15.21), #3 (01.12.22), #4 (02.09.22), #5 (03.16.22), #6 (04.20.22), #7 (06.01.22), #8 (06.29.22)--IVA resistance  - s/p IVE OS #1 (07.27.22), #2 (08.24.22), #3 (09.21.22), #4 (10.19.22), #5 (11.16.22), #6 (12.16.22), #7 (01.18.23), #8 (02.15.23)--IVE resistance  - s/p IVV OS #1 SAMPLE (03.15.23), #2 (04.12.23), #3 (05.10.23), #4 (06.07.23), #5 (07.19.23), #6 (08.22.23)  - BCVA 20/25 OS -- stable  - exam and OCT OS shows interval improvement / resolution of SRF overlying persistent PED at 5 wks  - recommend IVV OS #7 today, 09.27.23 with follow extending to 7 weeks - pt wishes to be treated with IVV OS - RBA of procedure discussed, questions answered - IVV informed consent obtained and signed 03.15.23 (OS) - see procedure note   - f/u in 7 wks -- DFE/OCT, possible injection  2. Exudative age related macular degeneration OD  - conversion to exudative ARMD noted on 03.15.23 exam  - exam with new SRH temporal macula -- stably improved today  - s/p IVE OD #1 (03.15.23), #2 (04.12.23), #3 (05.10.23), #4 (06.07.23), #5 (07.19.23), #6 (08.19.23), #7 (09.27.23)   - BCVA 20/20 OD -- stable  - OCT OD shows OD: stable improvement in low PED temporal macula, no IRF/SRF at 5 wks - recommend IVE OD #8 today, 11.01.23, maintenance with follow up extended to 7 weeks  - pt wishes to proceed with injection  - RBA of procedure discussed, questions answered - IVE informed consent obtained and signed, 03.15.23 (OD) - see procedure note   - f/u 7  weeks  3,4. Hypertensive retinopathy OU - discussed importance of tight BP control - monitor     5. Pseudophakia OU  - s/p CE/IOL OU  - IOLs in good position, doing well  - monitor   Ophthalmic Meds Ordered this visit:  Meds ordered this encounter  Medications   faricimab-svoa (VABYSMO) '6mg'$ /0.20m intravitreal injection   aflibercept (EYLEA) SOLN 2 mg     Return in about 7 weeks (around 10/30/2022) for f/u exu ARMD OU, DFE, OCT.  There are no Patient Instructions on file for this visit.  This document serves as a record of services personally performed by BGardiner Sleeper MD, PhD. It was created on their behalf by MOrvan Falconer an ophthalmic technician. The creation of this record is the provider's dictation and/or activities during the visit.    Electronically signed by: MOrvan Falconer OA, 09/16/22  12:37 AM  This document serves as a record of services personally performed by BGardiner Sleeper MD, PhD. It was created on their behalf by ASan Jetty BOwens Shark OA an ophthalmic technician. The creation of this record is the provider's dictation and/or activities during the visit.    Electronically signed by: ASan Jetty BOwens Shark O[place]11.01.2023 12:37 AM    BGardiner Sleeper M.D., Ph.D. Diseases & Surgery of  the Retina and Vitreous [facility]   I have reviewed the above documentation for accuracy and completeness, and I agree with the above. Gardiner Sleeper, M.D., Ph.D. 09/16/22 1:09 AM  Abbreviations: M myopia (nearsighted); A astigmatism; H hyperopia (farsighted); P presbyopia; Mrx spectacle prescription;  CTL contact lenses; OD right eye; OS left eye; OU both eyes  XT exotropia; ET esotropia; PEK punctate epithelial keratitis; PEE punctate epithelial erosions; DES dry eye syndrome; MGD meibomian gland dysfunction; ATs artificial tears; PFAT's preservative free artificial tears; [place] nuclear sclerotic cataract; PSC posterior subcapsular cataract; ERM  epi-retinal membrane; PVD posterior vitreous detachment; RD retinal detachment; DM diabetes mellitus; DR diabetic retinopathy; NPDR non-proliferative diabetic retinopathy; PDR proliferative diabetic retinopathy; CSME clinically significant macular edema; DME diabetic macular edema; dbh dot blot hemorrhages; CWS cotton wool spot; POAG primary open angle glaucoma; C/D cup-to-disc ratio; HVF humphrey visual field; GVF goldmann visual field; OCT optical coherence tomography; IOP intraocular pressure; BRVO Branch retinal vein occlusion; CRVO central retinal vein occlusion; CRAO central retinal artery occlusion; BRAO branch retinal artery occlusion; RT retinal tear; SB scleral buckle; PPV pars plana vitrectomy; VH Vitreous hemorrhage; PRP panretinal laser photocoagulation; IVK intravitreal kenalog; VMT vitreomacular traction; MH Macular hole;  NVD neovascularization of the disc; NVE neovascularization elsewhere; AREDS age related eye disease study; ARMD age related macular degeneration; POAG primary open angle glaucoma; EBMD epithelial/anterior basement membrane dystrophy; ACIOL anterior chamber intraocular lens; IOL intraocular lens; PCIOL posterior chamber intraocular lens; Phaco/IOL phacoemulsification with intraocular lens placement; [place] photorefractive keratectomy; LASIK laser assisted in situ keratomileusis; HTN hypertension; DM diabetes mellitus; COPD chronic obstructive pulmonary disease

## 2022-09-11 ENCOUNTER — Ambulatory Visit (INDEPENDENT_AMBULATORY_CARE_PROVIDER_SITE_OTHER): Payer: Medicare Other | Admitting: Ophthalmology

## 2022-09-11 ENCOUNTER — Encounter (INDEPENDENT_AMBULATORY_CARE_PROVIDER_SITE_OTHER): Payer: Self-pay | Admitting: Ophthalmology

## 2022-09-11 DIAGNOSIS — Z961 Presence of intraocular lens: Secondary | ICD-10-CM

## 2022-09-11 DIAGNOSIS — I10 Essential (primary) hypertension: Secondary | ICD-10-CM | POA: Diagnosis not present

## 2022-09-11 DIAGNOSIS — H353211 Exudative age-related macular degeneration, right eye, with active choroidal neovascularization: Secondary | ICD-10-CM

## 2022-09-11 DIAGNOSIS — H35033 Hypertensive retinopathy, bilateral: Secondary | ICD-10-CM | POA: Diagnosis not present

## 2022-09-11 DIAGNOSIS — H353132 Nonexudative age-related macular degeneration, bilateral, intermediate dry stage: Secondary | ICD-10-CM

## 2022-09-11 DIAGNOSIS — H353231 Exudative age-related macular degeneration, bilateral, with active choroidal neovascularization: Secondary | ICD-10-CM | POA: Diagnosis not present

## 2022-09-11 DIAGNOSIS — H353221 Exudative age-related macular degeneration, left eye, with active choroidal neovascularization: Secondary | ICD-10-CM

## 2022-09-11 MED ORDER — AFLIBERCEPT 2MG/0.05ML IZ SOLN FOR KALEIDOSCOPE
2.0000 mg | INTRAVITREAL | Status: AC | PRN
  Administered 2022-09-11: 2 mg via INTRAVITREAL

## 2022-09-11 MED ORDER — FARICIMAB-SVOA 6 MG/0.05ML IZ SOLN
6.0000 mg | INTRAVITREAL | Status: AC | PRN
  Administered 2022-09-11: 6 mg via INTRAVITREAL

## 2022-09-17 DIAGNOSIS — Z23 Encounter for immunization: Secondary | ICD-10-CM | POA: Diagnosis not present

## 2022-09-26 ENCOUNTER — Other Ambulatory Visit: Payer: Self-pay

## 2022-09-26 ENCOUNTER — Inpatient Hospital Stay (HOSPITAL_BASED_OUTPATIENT_CLINIC_OR_DEPARTMENT_OTHER): Payer: Medicare Other | Admitting: Hematology and Oncology

## 2022-09-26 ENCOUNTER — Inpatient Hospital Stay: Payer: Medicare Other | Attending: Hematology and Oncology

## 2022-09-26 ENCOUNTER — Encounter: Payer: Self-pay | Admitting: Hematology and Oncology

## 2022-09-26 VITALS — BP 134/60 | HR 51 | Temp 98.1°F | Resp 18 | Ht 62.0 in | Wt 143.6 lb

## 2022-09-26 DIAGNOSIS — C9110 Chronic lymphocytic leukemia of B-cell type not having achieved remission: Secondary | ICD-10-CM | POA: Insufficient documentation

## 2022-09-26 DIAGNOSIS — Z79899 Other long term (current) drug therapy: Secondary | ICD-10-CM | POA: Insufficient documentation

## 2022-09-26 DIAGNOSIS — Z85828 Personal history of other malignant neoplasm of skin: Secondary | ICD-10-CM

## 2022-09-26 LAB — CBC WITH DIFFERENTIAL (CANCER CENTER ONLY)
Abs Immature Granulocytes: 0.08 10*3/uL — ABNORMAL HIGH (ref 0.00–0.07)
Basophils Absolute: 0.1 10*3/uL (ref 0.0–0.1)
Basophils Relative: 1 %
Eosinophils Absolute: 0.3 10*3/uL (ref 0.0–0.5)
Eosinophils Relative: 1 %
HCT: 42.5 % (ref 36.0–46.0)
Hemoglobin: 14.2 g/dL (ref 12.0–15.0)
Immature Granulocytes: 0 %
Lymphocytes Relative: 77 %
Lymphs Abs: 22.1 10*3/uL — ABNORMAL HIGH (ref 0.7–4.0)
MCH: 27.6 pg (ref 26.0–34.0)
MCHC: 33.4 g/dL (ref 30.0–36.0)
MCV: 82.7 fL (ref 80.0–100.0)
Monocytes Absolute: 1.8 10*3/uL — ABNORMAL HIGH (ref 0.1–1.0)
Monocytes Relative: 7 %
Neutro Abs: 3.9 10*3/uL (ref 1.7–7.7)
Neutrophils Relative %: 14 %
Platelet Count: 155 10*3/uL (ref 150–400)
RBC: 5.14 MIL/uL — ABNORMAL HIGH (ref 3.87–5.11)
RDW: 14 % (ref 11.5–15.5)
Smear Review: NORMAL
WBC Count: 28.3 10*3/uL — ABNORMAL HIGH (ref 4.0–10.5)
nRBC: 0 % (ref 0.0–0.2)

## 2022-09-26 LAB — CMP (CANCER CENTER ONLY)
ALT: 18 U/L (ref 0–44)
AST: 21 U/L (ref 15–41)
Albumin: 4.7 g/dL (ref 3.5–5.0)
Alkaline Phosphatase: 75 U/L (ref 38–126)
Anion gap: 6 (ref 5–15)
BUN: 17 mg/dL (ref 8–23)
CO2: 32 mmol/L (ref 22–32)
Calcium: 10.1 mg/dL (ref 8.9–10.3)
Chloride: 103 mmol/L (ref 98–111)
Creatinine: 0.88 mg/dL (ref 0.44–1.00)
GFR, Estimated: >60 mL/min (ref >60)
Glucose, Bld: 108 mg/dL — ABNORMAL HIGH (ref 70–99)
Potassium: 3.7 mmol/L (ref 3.5–5.1)
Sodium: 141 mmol/L (ref 135–145)
Total Bilirubin: 0.7 mg/dL (ref 0.3–1.2)
Total Protein: 7.5 g/dL (ref 6.5–8.1)

## 2022-09-26 LAB — LACTATE DEHYDROGENASE: LDH: 145 U/L (ref 98–192)

## 2022-09-26 NOTE — Progress Notes (Signed)
[facility] OFFICE PROGRESS NOTE  Patient Care Team: Cari Caraway, MD as PCP - General (Family Medicine) Irine Seal, MD as Attending Physician (Urology) Jacolyn Reedy, MD as Attending Physician (Cardiology) Heath Lark, MD as Consulting Physician (Hematology and Oncology)  ASSESSMENT & PLAN:  CLL (chronic lymphocytic leukemia) She has recurrent leukocytosis, likely indicated for relapse of CLL She is not symptomatic and has no signs of lymphadenopathy I recommend seeing her back in about 6 months for further follow-up and she is in agreement She is educated to watch out for signs and symptoms of lymphadenopathy, abnormal weight loss or night sweats I explained to the patient the role of active surveillance in the absence of symptoms She has appointment scheduled to see her primary care doctor in March with repeat blood work I encouraged the patient to call me with results If her CBC is fairly stable, we will see her in 6 months However, if her CBC is significantly worse, I might have to move her appointment to see me sooner  Orders Placed This Encounter  Procedures   CBC with Differential ([place] Only)    Standing Status:   Future    Standing Expiration Date:   09/27/2023   CMP ([place] only)    Standing Status:   Future    Standing Expiration Date:   09/27/2023   Lactate dehydrogenase    Standing Status:   Future    Standing Expiration Date:   09/27/2023    All questions were answered. The patient knows to call the clinic with any problems, questions or concerns. The total time spent in the appointment was 20 minutes encounter with patients including review of chart and various tests results, discussions about plan of care and coordination of care plan   Heath Lark, MD 09/26/2022 9:50 AM  INTERVAL HISTORY: Please see below for problem oriented charting. she returns for active surveillance follow-up for recurrent CLL She is doing  well Denies recent infection She is up-to-date with her vaccination program Denies new lymphadenopathy or abnormal weight loss  REVIEW OF SYSTEMS:   Constitutional: Denies fevers, chills or abnormal weight loss Eyes: Denies blurriness of vision Ears, nose, mouth, throat, and face: Denies mucositis or sore throat Respiratory: Denies cough, dyspnea or wheezes Cardiovascular: Denies palpitation, chest discomfort or lower extremity swelling Gastrointestinal:  Denies nausea, heartburn or change in bowel habits Skin: Denies abnormal skin rashes Lymphatics: Denies new lymphadenopathy or easy bruising Neurological:Denies numbness, tingling or new weaknesses Behavioral/Psych: Mood is stable, no new changes  All other systems were reviewed with the patient and are negative.  I have reviewed the past medical history, past surgical history, social history and family history with the patient and they are unchanged from previous note.  ALLERGIES:  is allergic to codeine, demerol [meperidine], morphine and related, ciprofloxacin, and sulfa antibiotics.  MEDICATIONS:  Current Outpatient Medications  Medication Sig Dispense Refill   acetaminophen (TYLENOL) 500 MG tablet Take 500 mg by mouth every 6 (six) hours as needed for pain.     amLODipine (NORVASC) 5 MG tablet Take 5 mg by mouth daily.     atorvastatin (LIPITOR) 40 MG tablet Take 1 tablet by mouth daily.     Calcium Carb-Cholecalciferol (CALCIUM + D3) 600-200 MG-UNIT TABS Take 1 tablet by mouth 3 (three) times a week.      fluticasone (FLONASE) 50 MCG/ACT nasal spray Place 2 sprays into the nose daily.     folic acid (FOLVITE) 1 MG tablet  Take 1 mg by mouth daily.     hydrochlorothiazide (HYDRODIURIL) 12.5 MG tablet Take 12.5 mg by mouth daily.     ibandronate (BONIVA) 150 MG tablet Take 150 mg by mouth every 30 (thirty) days.     metoprolol (LOPRESSOR) 50 MG tablet Take 50 mg by mouth 2 (two) times daily.     Multiple Vitamin (MULTIVITAMIN)  tablet Take 1 tablet by mouth daily.     Multiple Vitamins-Minerals (EYE VITAMINS & MINERALS) TABS Take by mouth.     potassium chloride SA (K-DUR,KLOR-CON) 20 MEQ tablet Take 20 mEq by mouth 3 (three) times a week. Mon, Wed, Fri     No current facility-administered medications for this visit.    SUMMARY OF ONCOLOGIC HISTORY: Oncology History Overview Note  Del 13q   CLL (chronic lymphocytic leukemia) ([place])  02/09/2013 Pathology Results   Peripheral Blood Flow Cytometry - FINDINGS CONSISTENT WITH CHRONIC LYMPHOCYTIC LEUKEMIA   09/02/2013 Pathology Results   FISH positive for deletion 13 q   05/22/2017 Imaging   Worsening mesenteric and retroperitoneal adenopathy concerning for worsening lymphoproliferative disorder/lymphoma.   Small cysts in the liver and kidneys are stable.   Prior cholecystectomy.   Aortic atherosclerosis.   Left colonic diverticulosis without active diverticulitis.   08/11/2017 Imaging   Chest Impression:  1. Mild mediastinal and hilar lymphadenopathy. 2. Clusters small axial lymph nodes.  Abdomen / Pelvis Impression:  1. Mild periaortic retroperitoneal adenopathy and moderate central mesenteric adenopathy not changed comparison CT. 2. Mild iliac adenopathy 3. Normal volume spleen. 4. No explanation for RIGHT lower quadrant pain. Cluster of lymph nodes in the ileocecal mesenteries similar to prior.   08/12/2017 Procedure   Placement of single lumen port a cath via right internal jugular vein. The catheter tip lies at the cavo-atrial junction. A power injectable port a cath was placed and is ready for immediate use.   08/14/2017 - 11/14/2017 Chemotherapy   She received Bendamustine and Rituxan   11/12/2017 Imaging   1. Continued decrease in mediastinal and hilar lymph nodes with interval resolution of the abdominal and pelvic lymphadenopathy seen on the prior study. There is no lymphadenopathy in the chest, abdomen, or pelvis by CT size criteria on today's  study. No new or progressive interval findings. 2.  Aortic Atherosclerois (ICD10-170.0) 3. Hepatic and renal cysts, stable.   12/18/2017 Adverse Reaction   We are unable to resume cycle 5 of chemotherapy due to prolonged pancytopenia   01/29/2018 Procedure   Successful right IJ vein Port-A-Cath explant.     PHYSICAL EXAMINATION: ECOG PERFORMANCE STATUS: 0 - Asymptomatic  Vitals:   09/26/22 0913  BP: 134/60  Pulse: (!) 51  Resp: 18  Temp: 98.1 F (36.7 C)  SpO2: 98%   Filed Weights   09/26/22 0913  Weight: 143 lb 9.6 oz (65.1 kg)    GENERAL:alert, no distress and comfortable  NEURO: alert & oriented x 3 with fluent speech, no focal motor/sensory deficits  LABORATORY DATA:  I have reviewed the data as listed    Component Value Date/Time   NA 141 09/26/2022 0903   NA 141 11/13/2017 0753   K 3.7 09/26/2022 0903   K 4.1 11/13/2017 0753   CL 103 09/26/2022 0903   CL 104 02/08/2013 1454   CO2 32 09/26/2022 0903   CO2 27 11/13/2017 0753   GLUCOSE 108 (H) 09/26/2022 0903   GLUCOSE 113 11/13/2017 0753   GLUCOSE 103 (H) 02/08/2013 1454   BUN 17 09/26/2022 0903  BUN 11.6 11/13/2017 0753   CREATININE 0.88 09/26/2022 0903   CREATININE 0.8 11/13/2017 0753   CALCIUM 10.1 09/26/2022 0903   CALCIUM 9.3 11/13/2017 0753   PROT 7.5 09/26/2022 0903   PROT 6.2 (L) 11/13/2017 0753   ALBUMIN 4.7 09/26/2022 0903   ALBUMIN 3.3 (L) 11/13/2017 0753   AST 21 09/26/2022 0903   AST 24 11/13/2017 0753   ALT 18 09/26/2022 0903   ALT 24 11/13/2017 0753   ALKPHOS 75 09/26/2022 0903   ALKPHOS 83 11/13/2017 0753   BILITOT 0.7 09/26/2022 0903   BILITOT 0.38 11/13/2017 0753   GFRNONAA >60 09/26/2022 0903   GFRAA >60 01/12/2019 0938    No results found for: "SPEP", "UPEP"  Lab Results  Component Value Date   WBC 28.3 (H) 09/26/2022   NEUTROABS PENDING 09/26/2022   HGB 14.2 09/26/2022   HCT 42.5 09/26/2022   MCV 82.7 09/26/2022   PLT 155 09/26/2022      Chemistry       Component Value Date/Time   NA 141 09/26/2022 0903   NA 141 11/13/2017 0753   K 3.7 09/26/2022 0903   K 4.1 11/13/2017 0753   CL 103 09/26/2022 0903   CL 104 02/08/2013 1454   CO2 32 09/26/2022 0903   CO2 27 11/13/2017 0753   BUN 17 09/26/2022 0903   BUN 11.6 11/13/2017 0753   CREATININE 0.88 09/26/2022 0903   CREATININE 0.8 11/13/2017 0753      Component Value Date/Time   CALCIUM 10.1 09/26/2022 0903   CALCIUM 9.3 11/13/2017 0753   ALKPHOS 75 09/26/2022 0903   ALKPHOS 83 11/13/2017 0753   AST 21 09/26/2022 0903   AST 24 11/13/2017 0753   ALT 18 09/26/2022 0903   ALT 24 11/13/2017 0753   BILITOT 0.7 09/26/2022 0903   BILITOT 0.38 11/13/2017 0753

## 2022-09-26 NOTE — Assessment & Plan Note (Signed)
She has recurrent leukocytosis, likely indicated for relapse of CLL She is not symptomatic and has no signs of lymphadenopathy I recommend seeing her back in about 6 months for further follow-up and she is in agreement She is educated to watch out for signs and symptoms of lymphadenopathy, abnormal weight loss or night sweats I explained to the patient the role of active surveillance in the absence of symptoms She has appointment scheduled to see her primary care doctor in March with repeat blood work I encouraged the patient to call me with results If her CBC is fairly stable, we will see her in 6 months However, if her CBC is significantly worse, I might have to move her appointment to see me sooner

## 2022-10-09 ENCOUNTER — Encounter: Payer: Self-pay | Admitting: Nurse Practitioner

## 2022-10-09 ENCOUNTER — Ambulatory Visit: Payer: Medicare Other | Attending: Nurse Practitioner | Admitting: Nurse Practitioner

## 2022-10-09 VITALS — BP 132/74 | HR 51 | Ht 62.0 in | Wt 143.6 lb

## 2022-10-09 DIAGNOSIS — R001 Bradycardia, unspecified: Secondary | ICD-10-CM | POA: Diagnosis not present

## 2022-10-09 DIAGNOSIS — R0602 Shortness of breath: Secondary | ICD-10-CM | POA: Diagnosis not present

## 2022-10-09 DIAGNOSIS — I471 Supraventricular tachycardia, unspecified: Secondary | ICD-10-CM | POA: Diagnosis not present

## 2022-10-09 DIAGNOSIS — I10 Essential (primary) hypertension: Secondary | ICD-10-CM | POA: Diagnosis not present

## 2022-10-09 DIAGNOSIS — I480 Paroxysmal atrial fibrillation: Secondary | ICD-10-CM | POA: Diagnosis not present

## 2022-10-09 DIAGNOSIS — C9110 Chronic lymphocytic leukemia of B-cell type not having achieved remission: Secondary | ICD-10-CM | POA: Diagnosis not present

## 2022-10-09 DIAGNOSIS — E782 Mixed hyperlipidemia: Secondary | ICD-10-CM

## 2022-10-09 NOTE — Progress Notes (Signed)
Office Visit    Patient Name: Amanda [name] Date of Encounter: 10/09/2022  Primary Care Provider:  Cari Caraway, MD Primary Cardiologist:  Evalina Field, MD  Chief Complaint    76 year old female with a history of paroxysmal atrial fibrillation, PSVT, hypertension, hyperlipidemia, and CLL who presents for follow-up related to atrial fibrillation.  Past Medical History    Past Medical History:  Diagnosis Date   A-fib [facility])    Adenopathy    Atrial fibrillation (HCC)    CLL (chronic lymphocytic leukemia) ([place]) 08/31/2013   HTN (hypertension)    Hyperlipidemia    Hypertensive retinopathy    Lymphocytosis    Macular degeneration    OU   Past Surgical History:  Procedure Laterality Date   ABDOMINAL HYSTERECTOMY     endometriosis, fibroid   APPENDECTOMY     BREAST BIOPSY     benign cyst.   CATARACT EXTRACTION Bilateral 2019   CHOLECYSTECTOMY     COLONOSCOPY  11/2012   Per Dr. Cristina Gong.  neg.    EYE SURGERY     IR FLUORO GUIDE PORT INSERTION RIGHT  08/12/2017   IR REMOVAL TUN ACCESS W/ PORT W/O FL MOD SED  01/29/2018   IR US GUIDE VASC ACCESS RIGHT  08/12/2017   SQUAMOUS CELL CARCINOMA EXCISION  12/28/2020   Excised from top of head   THUMB ARTHROSCOPY     TOTAL HIP ARTHROPLASTY  2010    Allergies  Allergies  Allergen Reactions   Codeine Nausea And Vomiting   Demerol [Meperidine] Nausea And Vomiting   Morphine And Related Nausea And Vomiting   Ciprofloxacin Rash   Sulfa Antibiotics Rash    History of Present Illness    76 year old female with the above past medical history including paroxysmal atrial fibrillation, PSVT, hypertension, hyperlipidemia, and CLL.  She has a history of SVT.  She was hospitalized in 2012 in the setting of atrial fibrillation but quickly converted to sinus rhythm.  She has not had a recurrence in over 10 years. Previously on digoxin. She is not on anticoagulation. She was last seen in the office on 08/07/2021 and was stable from  a cardiac standpoint.  She presents today for follow-up.  Since her last visit she has done well from a cardiac standpoint.  Over the past couple of months she has noted a decrease in her heart rate.  Her heart rate is averaging 50 to 55 bpm.  She has also noted a slight increase in shortness of breath when else during exercise (he walks 45 minutes to an hour 3 times a week and participates in water aerobics).  She denies any chest pain, palpitations, dizziness, presyncope, syncope.  Other than her low heart rate and mild shortness of breath, she denies any additional concerns today.  Home Medications    Current Outpatient Medications  Medication Sig Dispense Refill   acetaminophen (TYLENOL) 500 MG tablet Take 500 mg by mouth every 6 (six) hours as needed for pain.     amLODipine (NORVASC) 5 MG tablet Take 5 mg by mouth daily.     atorvastatin (LIPITOR) 40 MG tablet Take 1 tablet by mouth daily.     Calcium Carb-Cholecalciferol (CALCIUM + D3) 600-200 MG-UNIT TABS Take 1 tablet by mouth 3 (three) times a week.      fluticasone (FLONASE) 50 MCG/ACT nasal spray Place 2 sprays into the nose daily.     folic acid (FOLVITE) 1 MG tablet Take 1 mg by mouth daily.  hydrochlorothiazide (HYDRODIURIL) 25 MG tablet Take 25 mg by mouth daily.     metoprolol (LOPRESSOR) 50 MG tablet Take 50 mg by mouth 2 (two) times daily.     Multiple Vitamin (MULTIVITAMIN) tablet Take 1 tablet by mouth daily.     Multiple Vitamins-Minerals (EYE VITAMINS & MINERALS) TABS Take by mouth.     potassium chloride SA (K-DUR,KLOR-CON) 20 MEQ tablet Take 20 mEq by mouth 3 (three) times a week. Mon, Wed, Fri     ibandronate (BONIVA) 150 MG tablet Take 150 mg by mouth every 30 (thirty) days. (Patient not taking: Reported on 10/09/2022)     No current facility-administered medications for this visit.     Review of Systems    She denies chest pain, palpitations, pnd, orthopnea, n, v, dizziness, syncope, edema, weight gain, or early  satiety. All other systems reviewed and are otherwise negative except as noted above.   Physical Exam    VS:  BP 132/74   Pulse (!) 51   Ht '5\' 2"'$  (1.575 m)   Wt 143 lb 9.6 oz (65.1 kg)   SpO2 99%   BMI 26.26 kg/m   GEN: Well nourished, well developed, in no acute distress. HEENT: normal. Neck: Supple, no JVD, carotid bruits, or masses. Cardiac: RRR, no murmurs, rubs, or gallops. No clubbing, cyanosis, edema.  Radials/DP/PT 2+ and equal bilaterally.  Respiratory:  Respirations regular and unlabored, clear to auscultation bilaterally. GI: Soft, nontender, nondistended, BS + x 4. MS: no deformity or atrophy. Skin: warm and dry, no rash. Neuro:  Strength and sensation are intact. Psych: Normal affect.  Accessory Clinical Findings    ECG personally reviewed by me today -sinus bradycardia, 51 bpm- no acute changes.   Lab Results  Component Value Date   WBC 28.3 (H) 09/26/2022   HGB 14.2 09/26/2022   HCT 42.5 09/26/2022   MCV 82.7 09/26/2022   PLT 155 09/26/2022   Lab Results  Component Value Date   CREATININE 0.88 09/26/2022   BUN 17 09/26/2022   NA 141 09/26/2022   K 3.7 09/26/2022   CL 103 09/26/2022   CO2 32 09/26/2022   Lab Results  Component Value Date   ALT 18 09/26/2022   AST 21 09/26/2022   ALKPHOS 75 09/26/2022   BILITOT 0.7 09/26/2022   Lab Results  Component Value Date   CHOL  01/02/2009    143        ATP III CLASSIFICATION:  <200     mg/dL   Desirable  200-239  mg/dL   Borderline High  >=240    mg/dL   High          HDL 45 01/02/2009   LDLCALC  01/02/2009    58        Total Cholesterol/HDL:CHD Risk Coronary Heart Disease Risk Table                     Men   Women  1/2 Average Risk   3.4   3.3  Average Risk       5.0   4.4  2 X Average Risk   9.6   7.1  3 X Average Risk  23.4   11.0        Use the calculated Patient Ratio above and the CHD Risk Table to determine the patient's CHD Risk.        ATP III CLASSIFICATION (LDL):  <100      mg/dL   Optimal  100-129  mg/dL   Near or Above                    Optimal  130-159  mg/dL   Borderline  160-189  mg/dL   High  >190     mg/dL   Very High   TRIG 201 (H) 01/02/2009   CHOLHDL 3.2 01/02/2009    Lab Results  Component Value Date   HGBA1C  01/02/2009    5.5 (NOTE)   The ADA recommends the following therapeutic goal for glycemic   control related to Hgb A1C measurement:   Goal of Therapy:   < 7.0% Hgb A1C   Reference: American Diabetes Association: Clinical Practice   Recommendations 2008, Diabetes Care,  2008, 31:(Suppl 1).    Assessment & Plan    1. Paroxysmal atrial fibrillation/PSVT/bradycardia: Diagnosed with atrial fibrillation in 2012.  No recurrence since this time.  She is not on anticoagulation.  Over the past few months she has noted that her heart rate has been in the 50s.  He denies any dizziness, fatigue, palpitations, presyncope or syncope.  Discussed possible de-escalation of metoprolol, however, patient prefers to continue to monitor at this time.    2. Shortness of breath: Patient exercises regularly.  Since she has had decreased heart rate she has noticed some increased shortness of breath when climbing steep hills.  She denies chest pain.  Euvolemic and well compensated on exam.  Discussed possibly decreasing  metoprolol as above, possible echo, however, patient declines at this time.  Continue to monitor symptoms.   3. Hypertension: BP well controlled. Continue current antihypertensive regimen.   4. Hyperlipidemia: LDL was 59 in 12/2021.  Monitored and managed per PCP.  Continue Lipitor.  5. CLL: Following with oncology.   6. Disposition: Follow-up in 6 months.      Lenna Sciara, NP 10/09/2022, 11:57 AM

## 2022-10-09 NOTE — Patient Instructions (Signed)
Medication Instructions:  Your physician recommends that you continue on your current medications as directed. Please refer to the Current Medication list given to you today.   *If you need a refill on your cardiac medications before your next appointment, please call your pharmacy*   Lab Work: NONE ordered at this time of appointment   If you have labs (blood work) drawn today and your tests are completely normal, you will receive your results only by: MyChart Message (if you have MyChart) OR A paper copy in the mail If you have any lab test that is abnormal or we need to change your treatment, we will call you to review the results.   Testing/Procedures: NONE ordered at this time of appointment     Follow-Up: At [facility], you and your health needs are our priority.  As part of our continuing mission to provide you with exceptional heart care, we have created designated Provider Care Teams.  These Care Teams include your primary Cardiologist (physician) and Advanced Practice Providers (APPs -  Physician Assistants and Nurse Practitioners) who all work together to provide you with the care you need, when you need it.  We recommend signing up for the patient portal called "MyChart".  Sign up information is provided on this After Visit Summary.  MyChart is used to connect with patients for Virtual Visits (Telemedicine).  Patients are able to view lab/test results, encounter notes, upcoming appointments, etc.  Non-urgent messages can be sent to your provider as well.   To learn more about what you can do with MyChart, go to [email or web address].    Your next appointment:   6 month(s)  The format for your next appointment:   In Person  Provider:   Zena T O'Neal, MD     Other Instructions   Important Information About Sugar       

## 2022-10-24 NOTE — Progress Notes (Addendum)
[facility] Note  10/30/2022    CHIEF COMPLAINT Patient presents for Retina Follow Up  HISTORY OF PRESENT ILLNESS: Amanda [name] is a 76 y.o. female who presents to the clinic today for:  HPI     Retina Follow Up   Patient presents with  Wet AMD.  In left eye.  This started months ago.  Duration of 7 weeks.  I, the attending physician,  performed the HPI with the patient and updated documentation appropriately.        Comments   Patient feels that the vision is the same. She is using AT's OU PRN.       Last edited by Bernarda Caffey, MD on 11/01/2022  1:08 AM.      Referring physician: Shirleen Schirmer, PA-C [facility] [address] [place],  [place] [address]  HISTORICAL INFORMATION:  Selected notes from the MEDICAL RECORD NUMBER Referred by Shirleen Schirmer, PA on 11.16.21 for eval of dry to wet AMD conversion OS.   CURRENT MEDICATIONS: No current outpatient medications on file. (Ophthalmic Drugs)   No current facility-administered medications for this visit. (Ophthalmic Drugs)   Current Outpatient Medications (Other)  Medication Sig   acetaminophen (TYLENOL) 500 MG tablet Take 500 mg by mouth every 6 (six) hours as needed for pain.   amLODipine (NORVASC) 5 MG tablet Take 5 mg by mouth daily.   atorvastatin (LIPITOR) 40 MG tablet Take 1 tablet by mouth daily.   Calcium Carb-Cholecalciferol (CALCIUM + D3) 600-200 MG-UNIT TABS Take 1 tablet by mouth 3 (three) times a week.    fluticasone (FLONASE) 50 MCG/ACT nasal spray Place 2 sprays into the nose daily.   folic acid (FOLVITE) 1 MG tablet Take 1 mg by mouth daily.   hydrochlorothiazide (HYDRODIURIL) 25 MG tablet Take 25 mg by mouth daily.   ibandronate (BONIVA) 150 MG tablet Take 150 mg by mouth every 30 (thirty) days. (Patient not taking: Reported on 10/09/2022)   metoprolol (LOPRESSOR) 50 MG tablet Take 50 mg by mouth 2 (two) times daily.   Multiple Vitamin  (MULTIVITAMIN) tablet Take 1 tablet by mouth daily.   Multiple Vitamins-Minerals (EYE VITAMINS & MINERALS) TABS Take by mouth.   potassium chloride SA (K-DUR,KLOR-CON) 20 MEQ tablet Take 20 mEq by mouth 3 (three) times a week. Mon, Wed, Fri   No current facility-administered medications for this visit. (Other)   REVIEW OF SYSTEMS: ROS   Positive for: Cardiovascular, Eyes Negative for: Constitutional, Gastrointestinal, Neurological, Skin, Genitourinary, Musculoskeletal, HENT, Endocrine, Respiratory, Psychiatric, Allergic/Imm, Heme/Lymph Last edited by Annie Paras, COT on 10/30/2022  1:43 PM.     ALLERGIES Allergies  Allergen Reactions   Codeine Nausea And Vomiting   Demerol [Meperidine] Nausea And Vomiting   Morphine And Related Nausea And Vomiting   Ciprofloxacin Rash   Sulfa Antibiotics Rash   PAST MEDICAL HISTORY Past Medical History:  Diagnosis Date   A-fib ([place])    Adenopathy    Atrial fibrillation ([place])    CLL (chronic lymphocytic leukemia) ([place]) 08/31/2013   HTN (hypertension)    Hyperlipidemia    Hypertensive retinopathy    Lymphocytosis    Macular degeneration    OU   Past Surgical History:  Procedure Laterality Date   ABDOMINAL HYSTERECTOMY     endometriosis, fibroid   APPENDECTOMY     BREAST BIOPSY     benign cyst.   CATARACT EXTRACTION Bilateral 2019   CHOLECYSTECTOMY  COLONOSCOPY  11/2012   Per Dr. Cristina Gong.  neg.    EYE SURGERY     IR FLUORO GUIDE PORT INSERTION RIGHT  08/12/2017   IR REMOVAL TUN ACCESS W/ PORT W/O FL MOD SED  01/29/2018   IR US GUIDE VASC ACCESS RIGHT  08/12/2017   SQUAMOUS CELL CARCINOMA EXCISION  12/28/2020   Excised from top of head   THUMB ARTHROSCOPY     TOTAL HIP ARTHROPLASTY  2010   FAMILY HISTORY Family History  Problem Relation Age of Onset   Renal Disease Mother    Cancer Mother        colon cancer   Cancer Father        colon cancer   SOCIAL HISTORY Social History   Tobacco Use   Smoking status:  Never   Smokeless tobacco: Never  Vaping Use   Vaping Use: Never used  Substance Use Topics   Alcohol use: No   Drug use: No       OPHTHALMIC EXAM: Base Eye Exam     Visual Acuity (Snellen - Linear)       Right Left   Dist cc 20/25 20/20    Correction: Glasses         Tonometry (Tonopen, 1:46 PM)       Right Left   Pressure 17 17         Pupils       Dark Light Shape React APD   Right 3 2 Round Brisk None   Left 3 2 Round Brisk None         Visual Fields       Left Right    Full Full         Extraocular Movement       Right Left    Full, Ortho Full, Ortho         Neuro/Psych     Oriented x3: Yes   Mood/Affect: Normal         Dilation     Both eyes: 1.0% Mydriacyl, 2.5% Phenylephrine @ 1:44 PM           Slit Lamp and Fundus Exam     Slit Lamp Exam       Right Left   Lids/Lashes Dermatochalasis - upper lid, mild Meibomian gland dysfunction Dermatochalasis - upper lid, mild Meibomian gland dysfunction   Conjunctiva/Sclera White and quiet White and quiet   Cornea Trace Debris in tear film, well healed temporal cataract wounds, 1+ pigmented Guttata, trace PEE well healed temporal cataract wounds, 2+ Punctate epithelial erosions   Anterior Chamber Deep and quiet Deep and quiet   Iris Round and dilated Round and dilated   Lens PC IOL in good position, trace PCO PC IOL in good position   Anterior Vitreous Trace Vitreous syneresis, Posterior vitreous detachment Trace Vitreous syneresis, Posterior vitreous detachment         Fundus Exam       Right Left   Disc Compact, trace pallor, Sharp rim, mild PPA Compact, Pink and Sharp, temporal PPP   C/D Ratio 0.1 0.2   Macula Blunted foveal reflex, Drusen, RPE mottling, clumping and early atrophy, shallow PED -- stably improved, SRH / edema temporal macula - stably improved Blunted foveal reflex, +CNV, Drusen, RPE mottling and clumping; shallow SRF -- slightly increased, no heme   Vessels  attenuated, mild tortuosity attenuated, mild tortuousity   Periphery Attached; mild reticular degeneration, no heme Attached, reticular degeneration, No heme  Refraction     Wearing Rx       Sphere Cylinder Axis Add   Right -0.25 +0.75 165 +2.50   Left -0.50 +0.50 012 +2.50    Type: PAL           IMAGING AND PROCEDURES  Imaging and Procedures for 10/30/2022  OCT, Retina - OU - Both Eyes       Right Eye Quality was good. Central Foveal Thickness: 273. Progression has been stable. Findings include normal foveal contour, no IRF, no SRF, retinal drusen , subretinal hyper-reflective material, intraretinal hyper-reflective material, pigment epithelial detachment, outer retinal atrophy (stable improvement in low PED temporal macula, no IRF/SRF).   Left Eye Quality was good. Central Foveal Thickness: 264. Progression has worsened. Findings include no IRF, abnormal foveal contour, retinal drusen , subretinal hyper-reflective material, choroidal neovascular membrane, pigment epithelial detachment, subretinal fluid, outer retinal atrophy (Mild interval increase in shallow SRF overlying persistent PED).   Notes *Images captured and stored on drive  Diagnosis / Impression:  OD: exu ARMD -- stable improvement in low PED temporal macula, no IRF/SRF OS: exu ARMD -- Mild interval increase in shallow SRF overlying persistent PED  Clinical management:  See below  Abbreviations: NFP - Normal foveal profile. CME - cystoid macular edema. PED - pigment epithelial detachment. IRF - intraretinal fluid. SRF - subretinal fluid. EZ - ellipsoid zone. ERM - epiretinal membrane. ORA - outer retinal atrophy. ORT - outer retinal tubulation. SRHM - subretinal hyper-reflective material. IRHM - intraretinal hyper-reflective material      Intravitreal Injection, Pharmacologic Agent - OS - Left Eye       Time Out 10/30/2022. 2:55 PM. Confirmed correct patient, procedure, site, and patient  consented.   Anesthesia Topical anesthesia was used. Anesthetic medications included Lidocaine 2%, Proparacaine 0.5%.   Procedure Preparation included 5% betadine to ocular surface, eyelid speculum. A (32g) needle was used.   Injection: 6 mg faricimab-svoa 6 MG/0.05ML   Route: Intravitreal, Site: Left Eye   NDC: [id number], Lot: [id number], Expiration date: 11/09/2024, Waste: 0 mL   Post-op Post injection exam found visual acuity of at least counting fingers. The patient tolerated the procedure well. There were no complications. The patient received written and verbal post procedure care education. Post injection medications were not given.            ASSESSMENT/PLAN:    ICD-10-CM   1. Exudative age-related macular degeneration of left eye with active choroidal neovascularization (HCC)  H35.3221 OCT, Retina - OU - Both Eyes    Intravitreal Injection, Pharmacologic Agent - OS - Left Eye    faricimab-svoa (VABYSMO) '6mg'$ /0.41m intravitreal injection    2. Exudative age-related macular degeneration of right eye with active choroidal neovascularization ([place]  H35.3211     3. Essential hypertension  I10     4. Hypertensive retinopathy of both eyes  H35.033     5. Pseudophakia, both eyes  Z96.1      1. Exudative age related macular degeneration, OS  - [facility] pt with known history of nonexudative ARMD OU  - acute "haze over vision" OS -- onset, Friday 11.12.21 -- initial exam with central subretinal heme  - s/p IVA OS #1 (11.17.21), #2 (12.15.21), #3 (01.12.22), #4 (02.09.22), #5 (03.16.22), #6 (04.20.22), #7 (06.01.22), #8 (06.29.22)--IVA resistance  - s/p IVE OS #1 (07.27.22), #2 (08.24.22), #3 (09.21.22), #4 (10.19.22), #5 (11.16.22), #6 (12.16.22), #7 (01.18.23), #8 (02.15.23)--IVE resistance  - s/p IVV OS #1 SAMPLE (03.15.23), #2 (04.12.23), #3 (  05.10.23), #4 (06.07.23), #5 (07.19.23), #6 (08.22.23), #7 (09.27.23)  - BCVA 20/20 OS -- stable  - exam and OCT OS mild  interval increase in shallow SRF overlying persistent PED at 7 wks  - recommend IVV OS #8 today, 12.20.23 with follow up back to 6 weeks - pt wishes to be treated with IVV OS - RBA of procedure discussed, questions answered - IVV informed consent obtained and signed 03.15.23 (OS) - see procedure note   - f/u in 6 wks -- DFE/OCT, possible injection  2. Exudative age related macular degeneration OD  - conversion to exudative ARMD noted on 03.15.23 exam  - exam with new SRH temporal macula -- stably improved today  - s/p IVE OD #1 (03.15.23), #2 (04.12.23), #3 (05.10.23), #4 (06.07.23), #5 (07.19.23), #6 (08.19.23), #7 (09.27.23), #8 (11.01.23)  - BCVA 20/25 OD -- stable  - OCT OD shows stable improvement in low PED temporal macula, no IRF/SRF at 7 wks - recommend holding injection today  - pt in agreement - IVE informed consent obtained and signed, 03.15.23 (OD)  - f/u 6 weeks  3,4. Hypertensive retinopathy OU - discussed importance of tight BP control - monitor     5. Pseudophakia OU  - s/p CE/IOL OU  - IOLs in good position, doing well  - monitor   Ophthalmic Meds Ordered this visit:  Meds ordered this encounter  Medications   faricimab-svoa (VABYSMO) '6mg'$ /0.10m intravitreal injection     Return in about 6 weeks (around 12/11/2022) for f/u exu ARMD OS, DFE, OCT.  There are no Patient Instructions on file for this visit.  This document serves as a record of services personally performed by BGardiner Sleeper MD, PhD. It was created on their behalf by MOrvan Falconer an ophthalmic technician. The creation of this record is the provider's dictation and/or activities during the visit.    Electronically signed by: MOrvan Falconer OA, 11/01/22  1:14 AM  BGardiner Sleeper M.D., Ph.D. Diseases & Surgery of the Retina and Vitreous [facility] I have reviewed the above documentation for accuracy and completeness, and I agree with the above. BGardiner Sleeper  M.D., Ph.D. 11/01/22 1:14 AM  Abbreviations: M myopia (nearsighted); A astigmatism; H hyperopia (farsighted); P presbyopia; Mrx spectacle prescription;  CTL contact lenses; OD right eye; OS left eye; OU both eyes  XT exotropia; ET esotropia; PEK punctate epithelial keratitis; PEE punctate epithelial erosions; DES dry eye syndrome; MGD meibomian gland dysfunction; ATs artificial tears; PFAT's preservative free artificial tears; NQuincynuclear sclerotic cataract; PSC posterior subcapsular cataract; ERM epi-retinal membrane; PVD posterior vitreous detachment; RD retinal detachment; DM diabetes mellitus; DR diabetic retinopathy; NPDR non-proliferative diabetic retinopathy; PDR proliferative diabetic retinopathy; CSME clinically significant macular edema; DME diabetic macular edema; dbh dot blot hemorrhages; CWS cotton wool spot; POAG primary open angle glaucoma; C/D cup-to-disc ratio; HVF humphrey visual field; GVF goldmann visual field; OCT optical coherence tomography; IOP intraocular pressure; BRVO Branch retinal vein occlusion; CRVO central retinal vein occlusion; CRAO central retinal artery occlusion; BRAO branch retinal artery occlusion; RT retinal tear; SB scleral buckle; PPV pars plana vitrectomy; VH Vitreous hemorrhage; PRP panretinal laser photocoagulation; IVK intravitreal kenalog; VMT vitreomacular traction; MH Macular hole;  NVD neovascularization of the disc; NVE neovascularization elsewhere; AREDS age related eye disease study; ARMD age related macular degeneration; POAG primary open angle glaucoma; EBMD epithelial/anterior basement membrane dystrophy; ACIOL anterior chamber intraocular lens; IOL intraocular lens; PCIOL posterior chamber intraocular lens; Phaco/IOL phacoemulsification with intraocular  lens placement; [place] photorefractive keratectomy; LASIK laser assisted in situ keratomileusis; HTN hypertension; DM diabetes mellitus; COPD chronic obstructive pulmonary disease

## 2022-10-30 ENCOUNTER — Ambulatory Visit (INDEPENDENT_AMBULATORY_CARE_PROVIDER_SITE_OTHER): Payer: Medicare Other | Admitting: Ophthalmology

## 2022-10-30 ENCOUNTER — Encounter (INDEPENDENT_AMBULATORY_CARE_PROVIDER_SITE_OTHER): Payer: Self-pay | Admitting: Ophthalmology

## 2022-10-30 DIAGNOSIS — H353132 Nonexudative age-related macular degeneration, bilateral, intermediate dry stage: Secondary | ICD-10-CM

## 2022-10-30 DIAGNOSIS — Z961 Presence of intraocular lens: Secondary | ICD-10-CM

## 2022-10-30 DIAGNOSIS — I10 Essential (primary) hypertension: Secondary | ICD-10-CM

## 2022-10-30 DIAGNOSIS — H35033 Hypertensive retinopathy, bilateral: Secondary | ICD-10-CM | POA: Diagnosis not present

## 2022-10-30 DIAGNOSIS — H353231 Exudative age-related macular degeneration, bilateral, with active choroidal neovascularization: Secondary | ICD-10-CM

## 2022-10-30 DIAGNOSIS — H353211 Exudative age-related macular degeneration, right eye, with active choroidal neovascularization: Secondary | ICD-10-CM

## 2022-10-30 DIAGNOSIS — H353221 Exudative age-related macular degeneration, left eye, with active choroidal neovascularization: Secondary | ICD-10-CM

## 2022-10-30 MED ORDER — FARICIMAB-SVOA 6 MG/0.05ML IZ SOLN
6.0000 mg | INTRAVITREAL | Status: AC | PRN
  Administered 2022-10-30: 6 mg via INTRAVITREAL

## 2022-11-21 ENCOUNTER — Inpatient Hospital Stay: Admission: RE | Admit: 2022-11-21 | Payer: Medicare Other | Source: Ambulatory Visit

## 2022-11-26 ENCOUNTER — Ambulatory Visit
Admission: RE | Admit: 2022-11-26 | Discharge: 2022-11-26 | Disposition: A | Discharge status: Home / Self Care | Payer: Medicare Other | Source: Ambulatory Visit | Attending: Family Medicine | Admitting: Family Medicine

## 2022-11-26 DIAGNOSIS — M85852 Other specified disorders of bone density and structure, left thigh: Secondary | ICD-10-CM

## 2022-11-26 DIAGNOSIS — M8589 Other specified disorders of bone density and structure, multiple sites: Secondary | ICD-10-CM | POA: Diagnosis not present

## 2022-11-26 DIAGNOSIS — Z780 Asymptomatic menopausal state: Secondary | ICD-10-CM | POA: Diagnosis not present

## 2022-11-27 DIAGNOSIS — D485 Neoplasm of uncertain behavior of skin: Secondary | ICD-10-CM | POA: Diagnosis not present

## 2022-11-27 DIAGNOSIS — D044 Carcinoma in situ of skin of scalp and neck: Secondary | ICD-10-CM | POA: Diagnosis not present

## 2022-12-04 NOTE — Progress Notes (Signed)
[facility] Note  12/11/2022    CHIEF COMPLAINT Patient presents for Retina Follow Up  HISTORY OF PRESENT ILLNESS: Amanda [name] is a 77 y.o. female who presents to the clinic today for:  HPI     Retina Follow Up   Patient presents with  Wet AMD.  In left eye.  This started 6 weeks ago.  I, the attending physician,  performed the HPI with the patient and updated documentation appropriately.        Comments   Patient here for 6 weeks retina follow up for exu ARMD OS. Patient states vision the same. No eye pain. Had mole removed top of head. Uses systane BID OU.      Last edited by Bernarda Caffey, MD on 12/11/2022  5:01 PM.    Pt states vision is stable, had Mohs procedure on the top of her head   Referring physician: Shirleen Schirmer, PA-C [facility] [address] [place],  [place] [address]  HISTORICAL INFORMATION:  Selected notes from the MEDICAL RECORD NUMBER Referred by Shirleen Schirmer, PA on 11.16.21 for eval of dry to wet AMD conversion OS.   CURRENT MEDICATIONS: No current outpatient medications on file. (Ophthalmic Drugs)   No current facility-administered medications for this visit. (Ophthalmic Drugs)   Current Outpatient Medications (Other)  Medication Sig   acetaminophen (TYLENOL) 500 MG tablet Take 500 mg by mouth every 6 (six) hours as needed for pain.   amLODipine (NORVASC) 5 MG tablet Take 5 mg by mouth daily.   atorvastatin (LIPITOR) 40 MG tablet Take 1 tablet by mouth daily.   Calcium Carb-Cholecalciferol (CALCIUM + D3) 600-200 MG-UNIT TABS Take 1 tablet by mouth 3 (three) times a week.    fluticasone (FLONASE) 50 MCG/ACT nasal spray Place 2 sprays into the nose daily.   folic acid (FOLVITE) 1 MG tablet Take 1 mg by mouth daily.   hydrochlorothiazide (HYDRODIURIL) 25 MG tablet Take 25 mg by mouth daily.   metoprolol (LOPRESSOR) 50 MG tablet Take 50 mg by mouth 2 (two) times daily.   Multiple Vitamin  (MULTIVITAMIN) tablet Take 1 tablet by mouth daily.   Multiple Vitamins-Minerals (EYE VITAMINS & MINERALS) TABS Take by mouth.   potassium chloride SA (K-DUR,KLOR-CON) 20 MEQ tablet Take 20 mEq by mouth 3 (three) times a week. Mon, Wed, Fri   ibandronate (BONIVA) 150 MG tablet Take 150 mg by mouth every 30 (thirty) days. (Patient not taking: Reported on 10/09/2022)   No current facility-administered medications for this visit. (Other)   REVIEW OF SYSTEMS: ROS   Positive for: Cardiovascular, Eyes Negative for: Constitutional, Gastrointestinal, Neurological, Skin, Genitourinary, Musculoskeletal, HENT, Endocrine, Respiratory, Psychiatric, Allergic/Imm, Heme/Lymph Last edited by Theodore Demark, COA on 12/11/2022  2:13 PM.      ALLERGIES Allergies  Allergen Reactions   Codeine Nausea And Vomiting   Demerol [Meperidine] Nausea And Vomiting   Morphine And Related Nausea And Vomiting   Ciprofloxacin Rash   Sulfa Antibiotics Rash   PAST MEDICAL HISTORY Past Medical History:  Diagnosis Date   A-fib [facility])    Adenopathy    Atrial fibrillation ([place])    CLL (chronic lymphocytic leukemia) ([place]) 08/31/2013   HTN (hypertension)    Hyperlipidemia    Hypertensive retinopathy    Lymphocytosis    Macular degeneration    OU   Past Surgical History:  Procedure Laterality Date   ABDOMINAL HYSTERECTOMY     endometriosis, fibroid  APPENDECTOMY     BREAST BIOPSY     benign cyst.   CATARACT EXTRACTION Bilateral 2019   CHOLECYSTECTOMY     COLONOSCOPY  11/2012   Per Dr. Cristina Gong.  neg.    EYE SURGERY     IR FLUORO GUIDE PORT INSERTION RIGHT  08/12/2017   IR REMOVAL TUN ACCESS W/ PORT W/O FL MOD SED  01/29/2018   IR US GUIDE VASC ACCESS RIGHT  08/12/2017   SQUAMOUS CELL CARCINOMA EXCISION  12/28/2020   Excised from top of head   THUMB ARTHROSCOPY     TOTAL HIP ARTHROPLASTY  2010   FAMILY HISTORY Family History  Problem Relation Age of Onset   Renal Disease Mother    Cancer Mother         colon cancer   Cancer Father        colon cancer   SOCIAL HISTORY Social History   Tobacco Use   Smoking status: Never   Smokeless tobacco: Never  Vaping Use   Vaping Use: Never used  Substance Use Topics   Alcohol use: No   Drug use: No       OPHTHALMIC EXAM: Base Eye Exam     Visual Acuity (Snellen - Linear)       Right Left   Dist cc 20/25 +2 20/20 -1   Dist ph cc NI     Correction: Glasses         Tonometry (Tonopen, 2:11 PM)       Right Left   Pressure 15 13         Pupils       Dark Light Shape React APD   Right 3 2 Round Brisk None   Left 3 2 Round Brisk None         Visual Fields (Counting fingers)       Left Right    Full Full         Extraocular Movement       Right Left    Full, Ortho Full, Ortho         Neuro/Psych     Oriented x3: Yes   Mood/Affect: Normal         Dilation     Both eyes: 1.0% Mydriacyl, 2.5% Phenylephrine @ 2:11 PM           Slit Lamp and Fundus Exam     Slit Lamp Exam       Right Left   Lids/Lashes Dermatochalasis - upper lid, mild Meibomian gland dysfunction Dermatochalasis - upper lid, mild Meibomian gland dysfunction   Conjunctiva/Sclera White and quiet White and quiet   Cornea Trace Debris in tear film, well healed temporal cataract wounds, 1+ pigmented Guttata, trace PEE well healed temporal cataract wounds, 2+ Punctate epithelial erosions   Anterior Chamber Deep and quiet Deep and quiet   Iris Round and dilated Round and dilated   Lens PC IOL in good position, trace PCO PC IOL in good position   Anterior Vitreous Trace Vitreous syneresis, Posterior vitreous detachment Trace Vitreous syneresis, Posterior vitreous detachment         Fundus Exam       Right Left   Disc Compact, trace pallor, Sharp rim, mild PPA Compact, Pink and Sharp, temporal PPP   C/D Ratio 0.1 0.2   Macula Blunted foveal reflex, Drusen, RPE mottling, clumping and early atrophy, shallow PED -- stably  improved, SRH / edema temporal macula - stably improved Blunted foveal reflex, +CNV,  Drusen, RPE mottling and clumping; trace shallow SRF -- persistent, no heme   Vessels attenuated, Tortuous attenuated, Tortuous   Periphery Attached; mild reticular degeneration, no heme Attached, reticular degeneration, No heme           Refraction     Wearing Rx       Sphere Cylinder Axis Add   Right -0.25 +0.75 165 +2.50   Left -0.50 +0.50 012 +2.50    Type: PAL           IMAGING AND PROCEDURES  Imaging and Procedures for 12/11/2022  OCT, Retina - OU - Both Eyes       Right Eye Quality was good. Central Foveal Thickness: 266. Progression has been stable. Findings include normal foveal contour, no IRF, no SRF, retinal drusen , subretinal hyper-reflective material, intraretinal hyper-reflective material, pigment epithelial detachment, outer retinal atrophy (stable improvement in low PED temporal macula, no IRF/SRF).   Left Eye Quality was good. Central Foveal Thickness: 264. Progression has been stable. Findings include no IRF, abnormal foveal contour, retinal drusen , subretinal hyper-reflective material, choroidal neovascular membrane, pigment epithelial detachment, subretinal fluid, outer retinal atrophy (persistent SRF overlying stable PED -- improved centrally, slightly increased inferior and temporal borders of PED).   Notes *Images captured and stored on drive  Diagnosis / Impression:  OD: exu ARMD -- stable improvement in low PED temporal macula, no IRF/SRF OS: exu ARMD -- persistent SRF overlying stable PED -- improved centrally, slightly increased inferior and temporal borders of PED  Clinical management:  See below  Abbreviations: NFP - Normal foveal profile. CME - cystoid macular edema. PED - pigment epithelial detachment. IRF - intraretinal fluid. SRF - subretinal fluid. EZ - ellipsoid zone. ERM - epiretinal membrane. ORA - outer retinal atrophy. ORT - outer retinal  tubulation. SRHM - subretinal hyper-reflective material. IRHM - intraretinal hyper-reflective material      Intravitreal Injection, Pharmacologic Agent - OS - Left Eye       Time Out 12/11/2022. 2:36 PM. Confirmed correct patient, procedure, site, and patient consented.   Anesthesia Topical anesthesia was used. Anesthetic medications included Lidocaine 2%, Proparacaine 0.5%.   Procedure Preparation included 5% betadine to ocular surface, eyelid speculum. A (32g) needle was used.   Injection: 6 mg faricimab-svoa 6 MG/0.05ML   Route: Intravitreal, Site: Left Eye   NDC: [id number], Lot: [id number], Expiration date: 02/08/2025, Waste: 0 mL   Post-op Post injection exam found visual acuity of at least counting fingers. The patient tolerated the procedure well. There were no complications. The patient received written and verbal post procedure care education.            ASSESSMENT/PLAN:    ICD-10-CM   1. Exudative age-related macular degeneration of left eye with active choroidal neovascularization (HCC)  H35.3221 OCT, Retina - OU - Both Eyes    Intravitreal Injection, Pharmacologic Agent - OS - Left Eye    faricimab-svoa (VABYSMO) '6mg'$ /0.76m intravitreal injection    2. Exudative age-related macular degeneration of right eye with active choroidal neovascularization ([place]  H35.3211     3. Essential hypertension  I10     4. Hypertensive retinopathy of both eyes  H35.033     5. Pseudophakia, both eyes  Z96.1      1. Exudative age related macular degeneration, OS  - [facility] pt with known history of nonexudative ARMD OU  - acute "haze over vision" OS -- onset, Friday 11.12.21 -- initial exam with central subretinal heme  - s/p  IVA OS #1 (11.17.21), #2 (12.15.21), #3 (01.12.22), #4 (02.09.22), #5 (03.16.22), #6 (04.20.22), #7 (06.01.22), #8 (06.29.22)--IVA resistance  - s/p IVE OS #1 (07.27.22), #2 (08.24.22), #3 (09.21.22), #4 (10.19.22), #5 (11.16.22), #6 (12.16.22), #7  (01.18.23), #8 (02.15.23)--IVE resistance  - s/p IVV OS #1 SAMPLE (03.15.23), #2 (04.12.23), #3 (05.10.23), #4 (06.07.23), #5 (07.19.23), #6 (08.22.23), #7 (09.27.23), #8 (12.20.23)  - BCVA 20/20 OS -- stable  - exam and OCT OS persistent SRF overlying stable PED -- improved centrally, slightly increased inferior and temporal borders of PED at 6 wks  - recommend IVV OS #9 today, 01.31.24 with follow up in 6 weeks again - pt wishes to be treated with IVV OS - RBA of procedure discussed, questions answered - IVV informed consent obtained and signed 03.15.23 (OS) - see procedure note   - f/u in 6 wks -- DFE/OCT, possible injection  2. Exudative age related macular degeneration OD  - conversion to exudative ARMD noted on 03.15.23 exam  - exam with new SRH temporal macula -- stably improved today  - s/p IVE OD #1 (03.15.23), #2 (04.12.23), #3 (05.10.23), #4 (06.07.23), #5 (07.19.23), #6 (08.19.23), #7 (09.27.23), #8 (11.01.23)  - BCVA 20/25 OD -- stable  - OCT OD shows stable improvement in low PED temporal macula, no IRF/SRF at 6 wks - recommend holding injection today  - pt in agreement - IVE informed consent obtained and signed, 03.15.23 (OD)  - f/u 6 weeks  3,4. Hypertensive retinopathy OU - discussed importance of tight BP control - monitor     5. Pseudophakia OU  - s/p CE/IOL OU  - IOLs in good position, doing well  - monitor   Ophthalmic Meds Ordered this visit:  Meds ordered this encounter  Medications   faricimab-svoa (VABYSMO) '6mg'$ /0.66m intravitreal injection     Return in about 6 weeks (around 01/22/2023) for f/u exu ARMD OS, DFE, OCT.  There are no Patient Instructions on file for this visit.  This document serves as a record of services personally performed by BGardiner Sleeper MD, PhD. It was created on their behalf by MOrvan Falconer an ophthalmic technician. The creation of this record is the provider's dictation and/or activities during the visit.     Electronically signed by: MOrvan Falconer OA, 12/11/22  5:02 PM  This document serves as a record of services personally performed by BGardiner Sleeper MD, PhD. It was created on their behalf by ASan Jetty BOwens Shark OA an ophthalmic technician. The creation of this record is the provider's dictation and/or activities during the visit.    Electronically signed by: [place]an Jetty [place]01.31.2024 5:02 PM   BGardiner Sleeper M.D., Ph.D. Diseases & Surgery of the Retina and Vitreous [place] I have reviewed the above documentation for accuracy and completeness, and I agree with the above. BGardiner Sleeper M.D., Ph.D. 12/11/22 5:03 PM   Abbreviations: M myopia (nearsighted); A astigmatism; H hyperopia (farsighted); P presbyopia; Mrx spectacle prescription;  CTL contact lenses; OD right eye; OS left eye; OU both eyes  XT exotropia; ET esotropia; PEK punctate epithelial keratitis; PEE punctate epithelial erosions; DES dry eye syndrome; MGD meibomian gland dysfunction; ATs artificial tears; PFAT's preservative free artificial tears; [place]and[place]uclear sclerotic cataract; PSC posterior subcapsular cataract; ERM epi-retinal membrane; PVD posterior vitreous detachment; RD retinal detachment; DM diabetes mellitus; DR diabetic retinopathy; NPDR non-proliferative diabetic retinopathy; PDR proliferative diabetic retinopathy; CSME clinically significant macular edema; DME diabetic macular edema; dbh dot blot  hemorrhages; CWS cotton wool spot; POAG primary open angle glaucoma; C/D cup-to-disc ratio; HVF humphrey visual field; GVF goldmann visual field; OCT optical coherence tomography; IOP intraocular pressure; BRVO Branch retinal vein occlusion; CRVO central retinal vein occlusion; CRAO central retinal artery occlusion; BRAO branch retinal artery occlusion; RT retinal tear; SB scleral buckle; PPV pars plana vitrectomy; VH Vitreous hemorrhage; PRP panretinal laser photocoagulation; IVK intravitreal  kenalog; VMT vitreomacular traction; MH Macular hole;  NVD neovascularization of the disc; NVE neovascularization elsewhere; AREDS age related eye disease study; ARMD age related macular degeneration; POAG primary open angle glaucoma; EBMD epithelial/anterior basement membrane dystrophy; ACIOL anterior chamber intraocular lens; IOL intraocular lens; PCIOL posterior chamber intraocular lens; Phaco/IOL phacoemulsification with intraocular lens placement; [place] photorefractive keratectomy; LASIK laser assisted in situ keratomileusis; HTN hypertension; DM diabetes mellitus; COPD chronic obstructive pulmonary disease

## 2022-12-09 DIAGNOSIS — D044 Carcinoma in situ of skin of scalp and neck: Secondary | ICD-10-CM | POA: Diagnosis not present

## 2022-12-11 ENCOUNTER — Encounter (INDEPENDENT_AMBULATORY_CARE_PROVIDER_SITE_OTHER): Payer: Self-pay | Admitting: Ophthalmology

## 2022-12-11 ENCOUNTER — Ambulatory Visit (INDEPENDENT_AMBULATORY_CARE_PROVIDER_SITE_OTHER): Payer: Medicare Other | Admitting: Ophthalmology

## 2022-12-11 DIAGNOSIS — H353211 Exudative age-related macular degeneration, right eye, with active choroidal neovascularization: Secondary | ICD-10-CM

## 2022-12-11 DIAGNOSIS — H35033 Hypertensive retinopathy, bilateral: Secondary | ICD-10-CM

## 2022-12-11 DIAGNOSIS — Z961 Presence of intraocular lens: Secondary | ICD-10-CM

## 2022-12-11 DIAGNOSIS — I10 Essential (primary) hypertension: Secondary | ICD-10-CM | POA: Diagnosis not present

## 2022-12-11 DIAGNOSIS — H353231 Exudative age-related macular degeneration, bilateral, with active choroidal neovascularization: Secondary | ICD-10-CM | POA: Diagnosis not present

## 2022-12-11 DIAGNOSIS — H353221 Exudative age-related macular degeneration, left eye, with active choroidal neovascularization: Secondary | ICD-10-CM

## 2022-12-11 MED ORDER — FARICIMAB-SVOA 6 MG/0.05ML IZ SOLN
6.0000 mg | INTRAVITREAL | Status: AC | PRN
  Administered 2022-12-11: 6 mg via INTRAVITREAL

## 2023-01-09 DIAGNOSIS — Z5189 Encounter for other specified aftercare: Secondary | ICD-10-CM | POA: Diagnosis not present

## 2023-01-09 DIAGNOSIS — Z85828 Personal history of other malignant neoplasm of skin: Secondary | ICD-10-CM | POA: Diagnosis not present

## 2023-01-14 NOTE — Progress Notes (Signed)
[facility] Note  01/22/2023    CHIEF COMPLAINT Patient presents for Retina Follow Up  HISTORY OF PRESENT ILLNESS: Amanda [name] is a 77 y.o. female who presents to the clinic today for:  HPI     Retina Follow Up   Patient presents with  Retinal Break/Detachment.  In both eyes.  This started 6 weeks ago.  Duration of 6 weeks.  Since onset it is stable.  I, the attending physician,  performed the HPI with the patient and updated documentation appropriately.        Comments   6 week retina follow up ARMD OU and IVV OS pt is reporting her vision is little wavy in the left eye she did have a fall little over a week ago but noticed that before then she denies any flashes but still seeing floaters       Last edited by Bernarda Caffey, MD on 01/22/2023  4:49 PM.     Referring physician: Shirleen Schirmer, PA-[facility] [address] [place],  [place] [address]  HISTORICAL INFORMATION:  Selected notes from the MEDICAL RECORD NUMBER Referred by Shirleen Schirmer, PA on 11.16.21 for eval of dry to wet AMD conversion OS.   CURRENT MEDICATIONS: No current outpatient medications on file. (Ophthalmic Drugs)   No current facility-administered medications for this visit. (Ophthalmic Drugs)   Current Outpatient Medications (Other)  Medication Sig   acetaminophen (TYLENOL) 500 MG tablet Take 500 mg by mouth every 6 (six) hours as needed for pain.   amLODipine (NORVASC) 5 MG tablet Take 5 mg by mouth daily.   atorvastatin (LIPITOR) 40 MG tablet Take 1 tablet by mouth daily.   Calcium Carb-Cholecalciferol (CALCIUM + D3) 600-200 MG-UNIT TABS Take 1 tablet by mouth 3 (three) times a week.    fluticasone (FLONASE) 50 MCG/ACT nasal spray Place 2 sprays into the nose daily.   folic acid (FOLVITE) 1 MG tablet Take 1 mg by mouth daily.   hydrochlorothiazide (HYDRODIURIL) 25 MG tablet Take 25 mg by mouth daily.   ibandronate (BONIVA) 150 MG tablet  Take 150 mg by mouth every 30 (thirty) days. (Patient not taking: Reported on 10/09/2022)   metoprolol (LOPRESSOR) 50 MG tablet Take 50 mg by mouth 2 (two) times daily.   Multiple Vitamin (MULTIVITAMIN) tablet Take 1 tablet by mouth daily.   Multiple Vitamins-Minerals (EYE VITAMINS & MINERALS) TABS Take by mouth.   potassium chloride SA (K-DUR,KLOR-CON) 20 MEQ tablet Take 20 mEq by mouth 3 (three) times a week. Mon, Wed, Fri   No current facility-administered medications for this visit. (Other)   REVIEW OF SYSTEMS: ROS   Positive for: Cardiovascular, Eyes Negative for: Constitutional, Gastrointestinal, Neurological, Skin, Genitourinary, Musculoskeletal, HENT, Endocrine, Respiratory, Psychiatric, Allergic/Imm, Heme/Lymph Last edited by Parthenia Ames, COT on 01/22/2023  2:31 PM.     ALLERGIES Allergies  Allergen Reactions   Codeine Nausea And Vomiting   Demerol [Meperidine] Nausea And Vomiting   Morphine And Related Nausea And Vomiting   Ciprofloxacin Rash   Sulfa Antibiotics Rash   PAST MEDICAL HISTORY Past Medical History:  Diagnosis Date   A-fib [facility])    Adenopathy    Atrial fibrillation ([place])    CLL (chronic lymphocytic leukemia) ([place]) 08/31/2013   HTN (hypertension)    Hyperlipidemia    Hypertensive retinopathy    Lymphocytosis    Macular degeneration    OU   Past Surgical History:  Procedure Laterality Date  ABDOMINAL HYSTERECTOMY     endometriosis, fibroid   APPENDECTOMY     BREAST BIOPSY     benign cyst.   CATARACT EXTRACTION Bilateral 2019   CHOLECYSTECTOMY     COLONOSCOPY  11/2012   Per Dr. Cristina Gong.  neg.    EYE SURGERY     IR FLUORO GUIDE PORT INSERTION RIGHT  08/12/2017   IR REMOVAL TUN ACCESS W/ PORT W/O FL MOD SED  01/29/2018   IR US GUIDE VASC ACCESS RIGHT  08/12/2017   SQUAMOUS CELL CARCINOMA EXCISION  12/28/2020   Excised from top of head   THUMB ARTHROSCOPY     TOTAL HIP ARTHROPLASTY  2010   FAMILY HISTORY Family History  Problem  Relation Age of Onset   Renal Disease Mother    Cancer Mother        colon cancer   Cancer Father        colon cancer   SOCIAL HISTORY Social History   Tobacco Use   Smoking status: Never   Smokeless tobacco: Never  Vaping Use   Vaping Use: Never used  Substance Use Topics   Alcohol use: No   Drug use: No       OPHTHALMIC EXAM: Base Eye Exam     Visual Acuity (Snellen - Linear)       Right Left   Dist cc 20/25 -2 20/25 -2    Correction: Glasses         Tonometry (Tonopen, 2:37 PM)       Right Left   Pressure 12 14         Pupils       Pupils Dark Light Shape React APD   Right PERRL 3 2 Round Brisk None   Left PERRL 3 2 Round Brisk None         Visual Fields       Left Right    Full Full         Extraocular Movement       Right Left    Full, Ortho Full, Ortho         Neuro/Psych     Oriented x3: Yes   Mood/Affect: Normal         Dilation     Both eyes: 2.5% Phenylephrine @ 2:37 PM           Slit Lamp and Fundus Exam     Slit Lamp Exam       Right Left   Lids/Lashes Dermatochalasis - upper lid, mild Meibomian gland dysfunction Dermatochalasis - upper lid, mild Meibomian gland dysfunction   Conjunctiva/Sclera White and quiet White and quiet   Cornea Trace Debris in tear film, well healed temporal cataract wounds, 1+ pigmented Guttata, trace PEE well healed temporal cataract wounds, 2+ Punctate epithelial erosions   Anterior Chamber Deep and quiet Deep and quiet   Iris Round and dilated Round and dilated   Lens PC IOL in good position, trace PCO PC IOL in good position   Anterior Vitreous Trace Vitreous syneresis, Posterior vitreous detachment Trace Vitreous syneresis, Posterior vitreous detachment         Fundus Exam       Right Left   Disc Compact, trace pallor, Sharp rim, mild PPA Compact, Pink and Sharp, temporal PPP   C/D Ratio 0.1 0.2   Macula Blunted foveal reflex, Drusen, RPE mottling, clumping and early  atrophy, shallow PED with trace SRF re-developing Blunted foveal reflex, +CNV, Drusen, RPE mottling and clumping;  trace shallow SRF -- persistent, no heme   Vessels attenuated, Tortuous attenuated, Tortuous   Periphery Attached; mild reticular degeneration, no heme Attached, reticular degeneration, No heme           Refraction     Wearing Rx       Sphere Cylinder Axis Add   Right -0.25 +0.75 165 +2.50   Left -0.50 +0.50 012 +2.50    Type: PAL           IMAGING AND PROCEDURES  Imaging and Procedures for 01/22/2023  OCT, Retina - OU - Both Eyes       Right Eye Quality was good. Central Foveal Thickness: 275. Progression has worsened. Findings include normal foveal contour, no IRF, retinal drusen , subretinal hyper-reflective material, intraretinal hyper-reflective material, pigment epithelial detachment, subretinal fluid, outer retinal atrophy (stable improvement in low PED temporal macula, interval re-development of shallow SRF overlying stable PED, no IRF).   Left Eye Quality was good. Central Foveal Thickness: 293. Progression has been stable. Findings include no IRF, abnormal foveal contour, retinal drusen , subretinal hyper-reflective material, choroidal neovascular membrane, pigment epithelial detachment, subretinal fluid, outer retinal atrophy (persistent SRF overlying stable PED -- improved inferiorly, increased superiorly).   Notes *Images captured and stored on drive  Diagnosis / Impression:  OD: exu ARMD -- stable improvement in low PED temporal macula, re-development of shallow SRF overlying stable PED, no IRF OS: exu ARMD -- persistent SRF overlying stable PED -- improved inferiorly, increased superiorly  Clinical management:  See below  Abbreviations: NFP - Normal foveal profile. CME - cystoid macular edema. PED - pigment epithelial detachment. IRF - intraretinal fluid. SRF - subretinal fluid. EZ - ellipsoid zone. ERM - epiretinal membrane. ORA - outer retinal  atrophy. ORT - outer retinal tubulation. SRHM - subretinal hyper-reflective material. IRHM - intraretinal hyper-reflective material      Intravitreal Injection, Pharmacologic Agent - OS - Left Eye       Time Out 01/22/2023. 3:47 PM. Confirmed correct patient, procedure, site, and patient consented.   Anesthesia Topical anesthesia was used. Anesthetic medications included Lidocaine 2%, Proparacaine 0.5%.   Procedure Preparation included 5% betadine to ocular surface, eyelid speculum. A (32g) needle was used.   Injection: 6 mg faricimab-svoa 6 MG/0.05ML   Route: Intravitreal, Site: Left Eye   NDCBD:[id number], Lot: [id number], Expiration date: 01/08/2025, Waste: 0 mL   Post-op Post injection exam found visual acuity of at least counting fingers. The patient tolerated the procedure well. There were no complications. The patient received written and verbal post procedure care education.      Intravitreal Injection, Pharmacologic Agent - OD - Right Eye       Time Out 01/22/2023. 3:47 PM. Confirmed correct patient, procedure, site, and patient consented.   Anesthesia Topical anesthesia was used. Anesthetic medications included Lidocaine 2%, Proparacaine 0.5%.   Procedure Preparation included 5% betadine to ocular surface, eyelid speculum. A (32g) needle was used.   Injection: 2 mg aflibercept 2 MG/0.05ML   Route: Intravitreal, Site: Right Eye   NDC: [id number], Lot: [id number], Expiration date: 09/11/2023, Waste: 0 mL   Post-op Post injection exam found visual acuity of at least counting fingers. The patient tolerated the procedure well. There were no complications. The patient received written and verbal post procedure care education. Post injection medications were not given.            ASSESSMENT/PLAN:    ICD-10-CM   1. Exudative age-related macular degeneration of left eye with  active choroidal neovascularization (HCC)  H35.3221 OCT, Retina - OU - Both Eyes     Intravitreal Injection, Pharmacologic Agent - OS - Left Eye    faricimab-svoa (VABYSMO) '6mg'$ /0.33m intravitreal injection    2. Exudative age-related macular degeneration of right eye with active choroidal neovascularization (HCC)  H35.3211 Intravitreal Injection, Pharmacologic Agent - OD - Right Eye    aflibercept (EYLEA) SOLN 2 mg    3. Essential hypertension  I10     4. Hypertensive retinopathy of both eyes  H35.033     5. Pseudophakia, both eyes  Z96.1      1. Exudative age related macular degeneration, OS  - [facility] pt with known history of nonexudative ARMD OU  - acute "haze over vision" OS -- onset, Friday 11.12.21 -- initial exam with central subretinal heme  - s/p IVA OS #1 (11.17.21), #2 (12.15.21), #3 (01.12.22), #4 (02.09.22), #5 (03.16.22), #6 (04.20.22), #7 (06.01.22), #8 (06.29.22)--IVA resistance  - s/p IVE OS #1 (07.27.22), #2 (08.24.22), #3 (09.21.22), #4 (10.19.22), #5 (11.16.22), #6 (12.16.22), #7 (01.18.23), #8 (02.15.23)--IVE resistance  - s/p IVV OS #1 SAMPLE (03.15.23), #2 (04.12.23), #3 (05.10.23), #4 (06.07.23), #5 (07.19.23), #6 (08.22.23), #7 (09.27.23), #8 (12.20.23), #9 (01.31.24)  - BCVA 20/25 OS -- slightly decreased  - exam and OCT OS w/ persistent SRF overlying stable PED -- improved inferiorly, increased superiorly at 6 wks  - recommend IVV OS #10 today, 03.13.24 with follow up in 6 weeks again - pt wishes to be treated with IVV OS - RBA of procedure discussed, questions answered - IVV informed consent obtained and signed 03.15.23 (OS) - see procedure note   - f/u in 6 wks -- DFE/OCT, possible injection  2. Exudative age related macular degeneration OD  - conversion to exudative ARMD noted on 03.15.23 exam  - exam with new SRH temporal macula -- stably improved today  - s/p IVE OD #1 (03.15.23), #2 (04.12.23), #3 (05.10.23), #4 (06.07.23), #5 (07.19.23), #6 (08.19.23), #7 (09.27.23), #8 (11.01.23)  - BCVA 20/25 OD -- stable  - OCT OD shows  stable improvement in low PED temporal macula, interval redevelopment of shallow SRF overlying stable PED, no IRF at 4 mos since last IVE - recommend IVE OD #9 today, 03.11.24 - may need maintenance treatment q3-4 months  - pt in agreement - RBA of procedure discussed, questions answered - see procedure note  - IVE informed consent obtained and signed, 03.15.23 (OD)  - f/u in 6 weeks  3,4. Hypertensive retinopathy OU - discussed importance of tight BP control - monitor     5. Pseudophakia OU  - s/p CE/IOL OU  - IOLs in good position, doing well  - monitor   Ophthalmic Meds Ordered this visit:  Meds ordered this encounter  Medications   faricimab-svoa (VABYSMO) '6mg'$ /0.029mintravitreal injection   aflibercept (EYLEA) SOLN 2 mg     Return in about 6 weeks (around 03/05/2023) for f/u exu ARMD OS, DFE, OCT.  There are no Patient Instructions on file for this visit.  This document serves as a record of services personally performed by BrGardiner SleeperMD, PhD. It was created on their behalf by MaOrvan Falconeran ophthalmic technician. The creation of this record is the provider's dictation and/or activities during the visit.    Electronically signed by: MaOrvan FalconerOA, 01/22/23  8:54 PM  BrGardiner SleeperM.D., Ph.D. Diseases & Surgery of the Retina and Vitreous [facility] ReBagnellI have reviewed  the above documentation for accuracy and completeness, and I agree with the above. Gardiner Sleeper, M.D., Ph.D. 01/22/23 9:06 PM   Abbreviations: M myopia (nearsighted); A astigmatism; H hyperopia (farsighted); P presbyopia; Mrx spectacle prescription;  CTL contact lenses; OD right eye; OS left eye; OU both eyes  XT exotropia; ET esotropia; PEK punctate epithelial keratitis; PEE punctate epithelial erosions; DES dry eye syndrome; MGD meibomian gland dysfunction; ATs artificial tears; PFAT's preservative free artificial tears; [place] nuclear sclerotic cataract; PSC  posterior subcapsular cataract; ERM epi-retinal membrane; PVD posterior vitreous detachment; RD retinal detachment; DM diabetes mellitus; DR diabetic retinopathy; NPDR non-proliferative diabetic retinopathy; PDR proliferative diabetic retinopathy; CSME clinically significant macular edema; DME diabetic macular edema; dbh dot blot hemorrhages; CWS cotton wool spot; POAG primary open angle glaucoma; C/D cup-to-disc ratio; HVF humphrey visual field; GVF goldmann visual field; OCT optical coherence tomography; IOP intraocular pressure; BRVO Branch retinal vein occlusion; CRVO central retinal vein occlusion; CRAO central retinal artery occlusion; BRAO branch retinal artery occlusion; RT retinal tear; SB scleral buckle; PPV pars plana vitrectomy; VH Vitreous hemorrhage; PRP panretinal laser photocoagulation; IVK intravitreal kenalog; VMT vitreomacular traction; MH Macular hole;  NVD neovascularization of the disc; NVE neovascularization elsewhere; AREDS age related eye disease study; ARMD age related macular degeneration; POAG primary open angle glaucoma; EBMD epithelial/anterior basement membrane dystrophy; ACIOL anterior chamber intraocular lens; IOL intraocular lens; PCIOL posterior chamber intraocular lens; Phaco/IOL phacoemulsification with intraocular lens placement; [place] photorefractive keratectomy; LASIK laser assisted in situ keratomileusis; HTN hypertension; DM diabetes mellitus; COPD chronic obstructive pulmonary disease

## 2023-01-15 ENCOUNTER — Other Ambulatory Visit: Payer: Medicare Other

## 2023-01-20 ENCOUNTER — Other Ambulatory Visit: Payer: Self-pay | Admitting: Family Medicine

## 2023-01-20 DIAGNOSIS — Z1231 Encounter for screening mammogram for malignant neoplasm of breast: Secondary | ICD-10-CM

## 2023-01-22 ENCOUNTER — Encounter (INDEPENDENT_AMBULATORY_CARE_PROVIDER_SITE_OTHER): Payer: Self-pay | Admitting: Ophthalmology

## 2023-01-22 ENCOUNTER — Ambulatory Visit (INDEPENDENT_AMBULATORY_CARE_PROVIDER_SITE_OTHER): Payer: Medicare Other | Admitting: Ophthalmology

## 2023-01-22 DIAGNOSIS — H35033 Hypertensive retinopathy, bilateral: Secondary | ICD-10-CM | POA: Diagnosis not present

## 2023-01-22 DIAGNOSIS — H353231 Exudative age-related macular degeneration, bilateral, with active choroidal neovascularization: Secondary | ICD-10-CM

## 2023-01-22 DIAGNOSIS — H353211 Exudative age-related macular degeneration, right eye, with active choroidal neovascularization: Secondary | ICD-10-CM

## 2023-01-22 DIAGNOSIS — I10 Essential (primary) hypertension: Secondary | ICD-10-CM | POA: Diagnosis not present

## 2023-01-22 DIAGNOSIS — Z961 Presence of intraocular lens: Secondary | ICD-10-CM

## 2023-01-22 DIAGNOSIS — H353221 Exudative age-related macular degeneration, left eye, with active choroidal neovascularization: Secondary | ICD-10-CM

## 2023-01-22 MED ORDER — AFLIBERCEPT 2MG/0.05ML IZ SOLN FOR KALEIDOSCOPE
2.0000 mg | INTRAVITREAL | Status: AC | PRN
  Administered 2023-01-22: 2 mg via INTRAVITREAL

## 2023-01-22 MED ORDER — FARICIMAB-SVOA 6 MG/0.05ML IZ SOLN
6.0000 mg | INTRAVITREAL | Status: AC | PRN
  Administered 2023-01-22: 6 mg via INTRAVITREAL

## 2023-01-24 DIAGNOSIS — M85852 Other specified disorders of bone density and structure, left thigh: Secondary | ICD-10-CM | POA: Diagnosis not present

## 2023-01-24 DIAGNOSIS — E782 Mixed hyperlipidemia: Secondary | ICD-10-CM | POA: Diagnosis not present

## 2023-01-31 ENCOUNTER — Telehealth: Payer: Self-pay

## 2023-01-31 NOTE — Telephone Encounter (Addendum)
Called to see if she had CBC completed with PCP this month. She said that PCP did not do CBC. She will see Dr. Addison Lank on 4/1 and will get CBC then. Given above message to Dr. Alvy Bimler.

## 2023-02-10 DIAGNOSIS — Z86010 Personal history of colonic polyps: Secondary | ICD-10-CM | POA: Diagnosis not present

## 2023-02-10 DIAGNOSIS — M85852 Other specified disorders of bone density and structure, left thigh: Secondary | ICD-10-CM | POA: Diagnosis not present

## 2023-02-10 DIAGNOSIS — R001 Bradycardia, unspecified: Secondary | ICD-10-CM | POA: Diagnosis not present

## 2023-02-10 DIAGNOSIS — I10 Essential (primary) hypertension: Secondary | ICD-10-CM | POA: Diagnosis not present

## 2023-02-10 DIAGNOSIS — C9112 Chronic lymphocytic leukemia of B-cell type in relapse: Secondary | ICD-10-CM | POA: Diagnosis not present

## 2023-02-10 DIAGNOSIS — E782 Mixed hyperlipidemia: Secondary | ICD-10-CM | POA: Diagnosis not present

## 2023-02-10 DIAGNOSIS — C9191 Lymphoid leukemia, unspecified, in remission: Secondary | ICD-10-CM | POA: Diagnosis not present

## 2023-02-10 DIAGNOSIS — J309 Allergic rhinitis, unspecified: Secondary | ICD-10-CM | POA: Diagnosis not present

## 2023-02-10 DIAGNOSIS — Z0000 Encounter for general adult medical examination without abnormal findings: Secondary | ICD-10-CM | POA: Diagnosis not present

## 2023-02-10 DIAGNOSIS — Z1331 Encounter for screening for depression: Secondary | ICD-10-CM | POA: Diagnosis not present

## 2023-02-10 DIAGNOSIS — I480 Paroxysmal atrial fibrillation: Secondary | ICD-10-CM | POA: Diagnosis not present

## 2023-02-11 ENCOUNTER — Telehealth: Payer: Self-pay

## 2023-02-11 NOTE — Telephone Encounter (Signed)
Returned call to [place] at [place]. Requesting appt with Dr. Alvy Bimler. WBC elevated and she has developed sweating. Given office fax #. Leafy Ro will fax Dr. Manfred Arch office note and labs to the office.

## 2023-02-11 NOTE — Telephone Encounter (Signed)
I can see her next Tues 4/9 at 3 pm

## 2023-02-11 NOTE — Telephone Encounter (Signed)
Called and scheduled appt on 4/9 at 3 pm. She is aware of appt.

## 2023-02-17 ENCOUNTER — Ambulatory Visit
Admission: RE | Admit: 2023-02-17 | Discharge: 2023-02-17 | Disposition: A | Discharge status: Home / Self Care | Payer: Medicare Other | Source: Ambulatory Visit | Attending: Family Medicine | Admitting: Family Medicine

## 2023-02-17 ENCOUNTER — Other Ambulatory Visit: Payer: Self-pay | Admitting: Family Medicine

## 2023-02-17 DIAGNOSIS — Z1231 Encounter for screening mammogram for malignant neoplasm of breast: Secondary | ICD-10-CM | POA: Diagnosis not present

## 2023-02-18 ENCOUNTER — Encounter: Payer: Self-pay | Admitting: Hematology and Oncology

## 2023-02-18 ENCOUNTER — Inpatient Hospital Stay: Payer: Medicare Other | Attending: Hematology and Oncology | Admitting: Hematology and Oncology

## 2023-02-18 ENCOUNTER — Other Ambulatory Visit: Payer: Self-pay

## 2023-02-18 VITALS — BP 132/60 | HR 62 | Temp 97.9°F | Resp 18 | Ht 62.0 in | Wt 145.6 lb

## 2023-02-18 DIAGNOSIS — R61 Generalized hyperhidrosis: Secondary | ICD-10-CM | POA: Diagnosis not present

## 2023-02-18 DIAGNOSIS — C9110 Chronic lymphocytic leukemia of B-cell type not having achieved remission: Secondary | ICD-10-CM | POA: Diagnosis not present

## 2023-02-18 NOTE — Progress Notes (Signed)
[facility] OFFICE PROGRESS NOTE  Patient Care Team: Gweneth DimitriMcNeill, Wendy, MD as PCP - General (Family Medicine) O'Neal, Ronnald RampWesley Thomas, MD as PCP - Cardiology (Cardiology) Bjorn PippinWrenn, John, MD as Attending Physician (Urology) Othella Boyerilley, William S, MD as Attending Physician (Cardiology) Artis DelayGorsuch, Jahaziel Francois, MD as Consulting Physician (Hematology and Oncology)  ASSESSMENT & PLAN:  CLL (chronic lymphocytic leukemia) Her examination is benign I am concerned about the rapid lymphocyte doubling time and new onset of night sweats Recommend CT imaging to assess I will see her back after CT imaging is done to review test results and to discuss whether it would be appropriate time to start her on treatment The patient would like to delay her return appointment due to going out of town next week I will see her back in 2 weeks for further follow-up  Orders Placed This Encounter  Procedures   CT CHEST ABDOMEN PELVIS W CONTRAST    Standing Status:   Future    Standing Expiration Date:   02/18/2024    Order Specific Question:   Preferred imaging location?    Answer[facility]    Order Specific Question:   Radiology Contrast Protocol - do NOT remove file path    Answer:   [email or web address]    All questions were answered. The patient knows to call the clinic with any problems, questions or concerns. The total time spent in the appointment was 30 minutes encounter with patients including review of chart and various tests results, discussions about plan of care and coordination of care plan   Artis DelayNi Sante Biedermann, MD 02/18/2023 3:06 PM  INTERVAL HISTORY: Please see below for problem oriented charting. she returns for urgent evaluation She saw her primary care doctor recently I have reviewed her recent records She complained of new onset of night sweats once or twice a week, not drenching in nature Her energy level is fair No new lymphadenopathy Denies recent  weight loss  REVIEW OF SYSTEMS:   Eyes: Denies blurriness of vision Ears, nose, mouth, throat, and face: Denies mucositis or sore throat Respiratory: Denies cough, dyspnea or wheezes Cardiovascular: Denies palpitation, chest discomfort or lower extremity swelling Gastrointestinal:  Denies nausea, heartburn or change in bowel habits Skin: Denies abnormal skin rashes Lymphatics: Denies new lymphadenopathy or easy bruising Neurological:Denies numbness, tingling or new weaknesses Behavioral/Psych: Mood is stable, no new changes  All other systems were reviewed with the patient and are negative.  I have reviewed the past medical history, past surgical history, social history and family history with the patient and they are unchanged from previous note.  ALLERGIES:  is allergic to codeine, demerol [meperidine], morphine and related, ciprofloxacin, and sulfa antibiotics.  MEDICATIONS:  Current Outpatient Medications  Medication Sig Dispense Refill   acetaminophen (TYLENOL) 500 MG tablet Take 500 mg by mouth every 6 (six) hours as needed for pain.     amLODipine (NORVASC) 5 MG tablet Take 5 mg by mouth daily.     atorvastatin (LIPITOR) 40 MG tablet Take 1 tablet by mouth daily.     Calcium Carb-Cholecalciferol (CALCIUM + D3) 600-200 MG-UNIT TABS Take 1 tablet by mouth 3 (three) times a week.      fluticasone (FLONASE) 50 MCG/ACT nasal spray Place 2 sprays into the nose daily.     folic acid (FOLVITE) 1 MG tablet Take 1 mg by mouth daily.     hydrochlorothiazide (HYDRODIURIL) 25 MG tablet Take 25 mg by mouth daily.     metoprolol (LOPRESSOR) 50  MG tablet Take 25 mg by mouth 2 (two) times daily.     Multiple Vitamin (MULTIVITAMIN) tablet Take 1 tablet by mouth daily.     Multiple Vitamins-Minerals (EYE VITAMINS & MINERALS) TABS Take by mouth.     potassium chloride SA (K-DUR,KLOR-CON) 20 MEQ tablet Take 20 mEq by mouth 3 (three) times a week. Mon, Wed, Fri     No current facility-administered  medications for this visit.    SUMMARY OF ONCOLOGIC HISTORY: Oncology History Overview Note  Del 13q   CLL (chronic lymphocytic leukemia)  02/09/2013 Pathology Results   Peripheral Blood Flow Cytometry - FINDINGS CONSISTENT WITH CHRONIC LYMPHOCYTIC LEUKEMIA   09/02/2013 Pathology Results   FISH positive for deletion 13 q   05/22/2017 Imaging   Worsening mesenteric and retroperitoneal adenopathy concerning for worsening lymphoproliferative disorder/lymphoma.   Small cysts in the liver and kidneys are stable.   Prior cholecystectomy.   Aortic atherosclerosis.   Left colonic diverticulosis without active diverticulitis.   08/11/2017 Imaging   Chest Impression:  1. Mild mediastinal and hilar lymphadenopathy. 2. Clusters small axial lymph nodes.  Abdomen / Pelvis Impression:  1. Mild periaortic retroperitoneal adenopathy and moderate central mesenteric adenopathy not changed comparison CT. 2. Mild iliac adenopathy 3. Normal volume spleen. 4. No explanation for RIGHT lower quadrant pain. Cluster of lymph nodes in the ileocecal mesenteries similar to prior.   08/12/2017 Procedure   Placement of single lumen port a cath via right internal jugular vein. The catheter tip lies at the cavo-atrial junction. A power injectable port a cath was placed and is ready for immediate use.   08/14/2017 - 11/14/2017 Chemotherapy   She received Bendamustine and Rituxan   11/12/2017 Imaging   1. Continued decrease in mediastinal and hilar lymph nodes with interval resolution of the abdominal and pelvic lymphadenopathy seen on the prior study. There is no lymphadenopathy in the chest, abdomen, or pelvis by CT size criteria on today's study. No new or progressive interval findings. 2.  Aortic Atherosclerois (ICD10-170.0) 3. Hepatic and renal cysts, stable.   12/18/2017 Adverse Reaction   We are unable to resume cycle 5 of chemotherapy due to prolonged pancytopenia   01/29/2018 Procedure   Successful  right IJ vein Port-A-Cath explant.     PHYSICAL EXAMINATION: ECOG PERFORMANCE STATUS: 1 - Symptomatic but completely ambulatory  Vitals:   02/18/23 1437  BP: 132/60  Pulse: 62  Resp: 18  Temp: 97.9 F (36.6 C)  SpO2: 99%   Filed Weights   02/18/23 1437  Weight: 145 lb 9.6 oz (66 kg)    GENERAL:alert, no distress and comfortable SKIN: skin color, texture, turgor are normal, no rashes or significant lesions EYES: normal, Conjunctiva are pink and non-injected, sclera clear OROPHARYNX:no exudate, no erythema and lips, buccal mucosa, and tongue normal  NECK: She has small palpable lymphadenopathy near the angle of the jaw on exam LYMPH:  no palpable lymphadenopathy in the cervical, axillary or inguinal LUNGS: clear to auscultation and percussion with normal breathing effort HEART: regular rate & rhythm and no murmurs and no lower extremity edema ABDOMEN:abdomen soft, non-tender and normal bowel sounds Musculoskeletal:no cyanosis of digits and no clubbing  NEURO: alert & oriented x 3 with fluent speech, no focal motor/sensory deficits  LABORATORY DATA:  I have reviewed the data as listed    Component Value Date/Time   NA 141 09/26/2022 0903   NA 141 11/13/2017 0753   K 3.7 09/26/2022 0903   K 4.1 11/13/2017 0753  CL 103 09/26/2022 0903   CL 104 02/08/2013 1454   CO2 32 09/26/2022 0903   CO2 27 11/13/2017 0753   GLUCOSE 108 (H) 09/26/2022 0903   GLUCOSE 113 11/13/2017 0753   GLUCOSE 103 (H) 02/08/2013 1454   BUN 17 09/26/2022 0903   BUN 11.6 11/13/2017 0753   CREATININE 0.88 09/26/2022 0903   CREATININE 0.8 11/13/2017 0753   CALCIUM 10.1 09/26/2022 0903   CALCIUM 9.3 11/13/2017 0753   PROT 7.5 09/26/2022 0903   PROT 6.2 (L) 11/13/2017 0753   ALBUMIN 4.7 09/26/2022 0903   ALBUMIN 3.3 (L) 11/13/2017 0753   AST 21 09/26/2022 0903   AST 24 11/13/2017 0753   ALT 18 09/26/2022 0903   ALT 24 11/13/2017 0753   ALKPHOS 75 09/26/2022 0903   ALKPHOS 83 11/13/2017 0753    BILITOT 0.7 09/26/2022 0903   BILITOT 0.38 11/13/2017 0753   GFRNONAA >60 09/26/2022 0903   GFRAA >60 01/12/2019 0938    No results found for: "SPEP", "UPEP"  Lab Results  Component Value Date   WBC 28.3 (H) 09/26/2022   NEUTROABS 3.9 09/26/2022   HGB 14.2 09/26/2022   HCT 42.5 09/26/2022   MCV 82.7 09/26/2022   PLT 155 09/26/2022      Chemistry      Component Value Date/Time   NA 141 09/26/2022 0903   NA 141 11/13/2017 0753   K 3.7 09/26/2022 0903   K 4.1 11/13/2017 0753   CL 103 09/26/2022 0903   CL 104 02/08/2013 1454   CO2 32 09/26/2022 0903   CO2 27 11/13/2017 0753   BUN 17 09/26/2022 0903   BUN 11.6 11/13/2017 0753   CREATININE 0.88 09/26/2022 0903   CREATININE 0.8 11/13/2017 0753      Component Value Date/Time   CALCIUM 10.1 09/26/2022 0903   CALCIUM 9.3 11/13/2017 0753   ALKPHOS 75 09/26/2022 0903   ALKPHOS 83 11/13/2017 0753   AST 21 09/26/2022 0903   AST 24 11/13/2017 0753   ALT 18 09/26/2022 0903   ALT 24 11/13/2017 0753   BILITOT 0.7 09/26/2022 0903   BILITOT 0.38 11/13/2017 0753       RADIOGRAPHIC STUDIES: I have personally reviewed the radiological images as listed and agreed with the findings in the report. MM 3D SCREENING MAMMOGRAM BILATERAL BREAST  Result Date: 02/18/2023 CLINICAL DATA:  Screening. EXAM: DIGITAL SCREENING BILATERAL MAMMOGRAM WITH TOMOSYNTHESIS AND CAD TECHNIQUE: Bilateral screening digital craniocaudal and mediolateral oblique mammograms were obtained. Bilateral screening digital breast tomosynthesis was performed. The images were evaluated with computer-aided detection. COMPARISON:  Previous exam(s). ACR Breast Density Category b: There are scattered areas of fibroglandular density. FINDINGS: There are no findings suspicious for malignancy. IMPRESSION: No mammographic evidence of malignancy. A result letter of this screening mammogram will be mailed directly to the patient. RECOMMENDATION: Screening mammogram in one year.  (Code:SM-B-01Y) BI-RADS CATEGORY  1: Negative. Electronically Signed   By: Sherian Rein M.D.   On: 02/18/2023 11:26   Intravitreal Injection, Pharmacologic Agent - OD - Right Eye  Result Date: 01/22/2023 Time Out 01/22/2023. 3:47 PM. Confirmed correct patient, procedure, site, and patient consented. Anesthesia Topical anesthesia was used. Anesthetic medications included Lidocaine 2%, Proparacaine 0.5%. Procedure Preparation included 5% betadine to ocular surface, eyelid speculum. A (32g) needle was used. Injection: 2 mg aflibercept 2 MG/0.05ML   Route: Intravitreal, Site: Right Eye   NDC: L6038910, Lot: [id number], Expiration date: 09/11/2023, Waste: 0 mL Post-op Post injection exam found visual acuity of at least  counting fingers. The patient tolerated the procedure well. There were no complications. The patient received written and verbal post procedure care education. Post injection medications were not given.   Intravitreal Injection, Pharmacologic Agent - OS - Left Eye  Result Date: 01/22/2023 Time Out 01/22/2023. 3:47 PM. Confirmed correct patient, procedure, site, and patient consented. Anesthesia Topical anesthesia was used. Anesthetic medications included Lidocaine 2%, Proparacaine 0.5%. Procedure Preparation included 5% betadine to ocular surface, eyelid speculum. A (32g) needle was used. Injection: 6 mg faricimab-svoa 6 MG/0.05ML   Route: Intravitreal, Site: Left Eye   NDC: 67209-470-96, Lot: [id number], Expiration date: 01/08/2025, Waste: 0 mL Post-op Post injection exam found visual acuity of at least counting fingers. The patient tolerated the procedure well. There were no complications. The patient received written and verbal post procedure care education.   OCT, Retina - OU - Both Eyes  Result Date: 01/22/2023 Right Eye Quality was good. Central Foveal Thickness: 275. Progression has worsened. Findings include normal foveal contour, no IRF, retinal drusen , subretinal hyper-reflective  material, intraretinal hyper-reflective material, pigment epithelial detachment, subretinal fluid, outer retinal atrophy (stable improvement in low PED temporal macula, interval re-development of shallow SRF overlying stable PED, no IRF). Left Eye Quality was good. Central Foveal Thickness: 293. Progression has been stable. Findings include no IRF, abnormal foveal contour, retinal drusen , subretinal hyper-reflective material, choroidal neovascular membrane, pigment epithelial detachment, subretinal fluid, outer retinal atrophy (persistent SRF overlying stable PED -- improved inferiorly, increased superiorly). Notes *Images captured and stored on drive Diagnosis / Impression: OD: exu ARMD -- stable improvement in low PED temporal macula, re-development of shallow SRF overlying stable PED, no IRF OS: exu ARMD -- persistent SRF overlying stable PED -- improved inferiorly, increased superiorly Clinical management: See below Abbreviations: NFP - Normal foveal profile. CME - cystoid macular edema. PED - pigment epithelial detachment. IRF - intraretinal fluid. SRF - subretinal fluid. EZ - ellipsoid zone. ERM - epiretinal membrane. ORA - outer retinal atrophy. ORT - outer retinal tubulation. SRHM - subretinal hyper-reflective material. IRHM - intraretinal hyper-reflective material

## 2023-02-18 NOTE — Assessment & Plan Note (Signed)
Her examination is benign I am concerned about the rapid lymphocyte doubling time and new onset of night sweats Recommend CT imaging to assess I will see her back after CT imaging is done to review test results and to discuss whether it would be appropriate time to start her on treatment The patient would like to delay her return appointment due to going out of town next week I will see her back in 2 weeks for further follow-up

## 2023-02-24 ENCOUNTER — Ambulatory Visit (HOSPITAL_COMMUNITY)
Admission: RE | Admit: 2023-02-24 | Discharge: 2023-02-24 | Disposition: A | Discharge status: Home / Self Care | Payer: Medicare Other | Source: Ambulatory Visit | Attending: Hematology and Oncology | Admitting: Hematology and Oncology

## 2023-02-24 DIAGNOSIS — C9110 Chronic lymphocytic leukemia of B-cell type not having achieved remission: Secondary | ICD-10-CM | POA: Insufficient documentation

## 2023-02-24 DIAGNOSIS — R911 Solitary pulmonary nodule: Secondary | ICD-10-CM | POA: Diagnosis not present

## 2023-02-24 DIAGNOSIS — N289 Disorder of kidney and ureter, unspecified: Secondary | ICD-10-CM | POA: Diagnosis not present

## 2023-02-24 MED ORDER — IOHEXOL 300 MG/ML  SOLN
100.0000 mL | Freq: Once | INTRAMUSCULAR | Status: AC | PRN
  Administered 2023-02-24: 100 mL via INTRAVENOUS

## 2023-02-24 MED ORDER — SODIUM CHLORIDE (PF) 0.9 % IJ SOLN
INTRAMUSCULAR | Status: AC
  Filled 2023-02-24: qty 50

## 2023-02-25 ENCOUNTER — Telehealth: Payer: Self-pay

## 2023-02-25 NOTE — Telephone Encounter (Signed)
-----   Message from Artis Delay, MD sent at 02/25/2023  9:49 AM EDT ----- Looks like CT result is available Can you call and ask if she wants to see me sooner on Thursday?

## 2023-02-25 NOTE — Telephone Encounter (Signed)
Called to see if she could come in this Thursday to see Dr. Bertis Ruddy. She will be out of town and will keep appt as scheduled.

## 2023-02-26 ENCOUNTER — Encounter (INDEPENDENT_AMBULATORY_CARE_PROVIDER_SITE_OTHER): Payer: Medicare Other | Admitting: Ophthalmology

## 2023-03-03 ENCOUNTER — Inpatient Hospital Stay (HOSPITAL_BASED_OUTPATIENT_CLINIC_OR_DEPARTMENT_OTHER): Payer: Medicare Other | Admitting: Hematology and Oncology

## 2023-03-03 ENCOUNTER — Other Ambulatory Visit (HOSPITAL_COMMUNITY): Payer: Self-pay

## 2023-03-03 ENCOUNTER — Telehealth: Payer: Self-pay

## 2023-03-03 ENCOUNTER — Other Ambulatory Visit: Payer: Self-pay

## 2023-03-03 ENCOUNTER — Telehealth: Payer: Self-pay | Admitting: Pharmacy Technician

## 2023-03-03 ENCOUNTER — Encounter: Payer: Self-pay | Admitting: Hematology and Oncology

## 2023-03-03 VITALS — BP 125/61 | HR 66 | Temp 98.3°F | Resp 18 | Ht 62.0 in | Wt 146.4 lb

## 2023-03-03 DIAGNOSIS — R61 Generalized hyperhidrosis: Secondary | ICD-10-CM | POA: Diagnosis not present

## 2023-03-03 DIAGNOSIS — C9110 Chronic lymphocytic leukemia of B-cell type not having achieved remission: Secondary | ICD-10-CM | POA: Diagnosis not present

## 2023-03-03 MED ORDER — CALQUENCE 100 MG PO CAPS
100.0000 mg | ORAL_CAPSULE | Freq: Two times a day (BID) | ORAL | 11 refills | Status: DC
  Filled 2023-03-03: qty 60, 30d supply, fill #0

## 2023-03-03 MED ORDER — ACYCLOVIR 400 MG PO TABS
400.0000 mg | ORAL_TABLET | Freq: Every day | ORAL | 6 refills | Status: DC
  Filled 2023-03-03 – 2023-03-06 (×3): qty 30, 30d supply, fill #0
  Filled 2023-04-08: qty 30, 30d supply, fill #1
  Filled 2023-04-23: qty 30, 30d supply, fill #2
  Filled 2023-06-12: qty 30, 30d supply, fill #3
  Filled 2023-07-21: qty 30, 30d supply, fill #4
  Filled 2023-08-16: qty 30, 30d supply, fill #5
  Filled 2023-09-15: qty 30, 30d supply, fill #6

## 2023-03-03 MED ORDER — ALLOPURINOL 300 MG PO TABS
300.0000 mg | ORAL_TABLET | Freq: Every day | ORAL | 0 refills | Status: DC
  Filled 2023-03-03 – 2023-03-06 (×3): qty 30, 30d supply, fill #0

## 2023-03-03 NOTE — Telephone Encounter (Signed)
Oral Oncology Patient Advocate Encounter   Was successful in securing patient a $ 4,500 grant from [employer] to provide copayment coverage for Calquence.  This will keep the out of pocket expense at $0.     I have spoken with the patient.  The billing information is as follows and has been shared with [facility].   Member ID: [id number] Group ID: [id number] RxBin: [id number] Dates of Eligibility: 03/03/23 through 03/02/24  Fund:  CLL  Jinger Neighbors, CPhT-Adv Oncology Pharmacy Patient Advocate [facility] Direct Number: [phone]  Fax: [phone]

## 2023-03-03 NOTE — Telephone Encounter (Signed)
Oral Oncology Patient Advocate Encounter  Prior Authorization for Calquence has been approved.    PA# [id number] Effective dates: 03/03/23 through 11/11/23  Patients co-pay is $3,278.59.    Jinger Neighbors, CPhT-Adv Oncology Pharmacy Patient Advocate [facility] Direct Number: [phone]  Fax: [phone]

## 2023-03-03 NOTE — Telephone Encounter (Signed)
Oral Oncology Patient Advocate Encounter   Received notification that prior authorization for Calquence is required.   PA submitted on 03/03/23 Key [id number] Status is pending     Jinger Neighbors, CPhT-Adv Oncology Pharmacy Patient Advocate [facility] Direct Number: [phone]  Fax: [phone]

## 2023-03-03 NOTE — Progress Notes (Shared)
[facility] - Clinic Note  03/05/2023    CHIEF COMPLAINT Patient presents for Retina Follow Up  HISTORY OF PRESENT ILLNESS: Amanda [name] is a 77 y.o. female who presents to the clinic today for:  HPI     Retina Follow Up   Patient presents with  Wet AMD.  In left eye.  This started 6 weeks ago.  I, the attending physician,  performed the HPI with the patient and updated documentation appropriately.        Comments   Patient here for 6 weeks retina follow up for exu ARMD OS. Patient states vision pretty good. Having a little difficulty reading. Gets more eye fatigue. Should she get new readers or glasses? Leukemia has come back starts taking calquence in May.      Last edited by Rennis Chris, MD on 03/05/2023  3:35 PM.    Pt states she has to go back on chemo for Leukemia, but she will be taking a pill this time instead of infusions  Referring physician: Alma Downs, PA-C [facility] [address] [place],  [place] [address]  HISTORICAL INFORMATION:  Selected notes from the MEDICAL RECORD NUMBER Referred by Alma Downs, PA on 11.16.21 for eval of dry to wet AMD conversion OS.   CURRENT MEDICATIONS: No current outpatient medications on file. (Ophthalmic Drugs)   No current facility-administered medications for this visit. (Ophthalmic Drugs)   Current Outpatient Medications (Other)  Medication Sig   acalabrutinib maleate (CALQUENCE) 100 MG tablet Take 1 tablet (100 mg total) by mouth 2 (two) times daily.   acetaminophen (TYLENOL) 500 MG tablet Take 500 mg by mouth every 6 (six) hours as needed for pain.   acyclovir (ZOVIRAX) 400 MG tablet Take 1 tablet (400 mg total) by mouth daily.   allopurinol (ZYLOPRIM) 300 MG tablet Take 1 tablet (300 mg total) by mouth daily.   amLODipine (NORVASC) 5 MG tablet Take 5 mg by mouth daily.   atorvastatin (LIPITOR) 40 MG tablet Take 1 tablet by mouth daily.   Calcium  Carb-Cholecalciferol (CALCIUM + D3) 600-200 MG-UNIT TABS Take 1 tablet by mouth 3 (three) times a week.    fluticasone (FLONASE) 50 MCG/ACT nasal spray Place 2 sprays into the nose daily.   folic acid (FOLVITE) 1 MG tablet Take 1 mg by mouth daily.   hydrochlorothiazide (HYDRODIURIL) 25 MG tablet Take 25 mg by mouth daily.   metoprolol (LOPRESSOR) 50 MG tablet Take 25 mg by mouth 2 (two) times daily.   Multiple Vitamin (MULTIVITAMIN) tablet Take 1 tablet by mouth daily.   Multiple Vitamins-Minerals (EYE VITAMINS & MINERALS) TABS Take by mouth.   potassium chloride SA (K-DUR,KLOR-CON) 20 MEQ tablet Take 20 mEq by mouth 3 (three) times a week. Mon, Wed, Fri   No current facility-administered medications for this visit. (Other)   REVIEW OF SYSTEMS: ROS   Positive for: Cardiovascular, Eyes Negative for: Constitutional, Gastrointestinal, Neurological, Skin, Genitourinary, Musculoskeletal, HENT, Endocrine, Respiratory, Psychiatric, Allergic/Imm, Heme/Lymph Last edited by Laddie Aquas, COA on 03/05/2023  2:30 PM.      ALLERGIES Allergies  Allergen Reactions   Codeine Nausea And Vomiting   Demerol [Meperidine] Nausea And Vomiting   Morphine And Related Nausea And Vomiting   Ciprofloxacin Rash   Sulfa Antibiotics Rash   PAST MEDICAL HISTORY Past Medical History:  Diagnosis Date   A-fib    Adenopathy    Atrial fibrillation    CLL (chronic lymphocytic leukemia)  08/31/2013   HTN (hypertension)    Hyperlipidemia    Hypertensive retinopathy    Lymphocytosis    Macular degeneration    OU   Past Surgical History:  Procedure Laterality Date   ABDOMINAL HYSTERECTOMY     endometriosis, fibroid   APPENDECTOMY     BREAST BIOPSY     benign cyst.   CATARACT EXTRACTION Bilateral 2019   CHOLECYSTECTOMY     COLONOSCOPY  11/2012   Per Dr. Matthias Hughs.  neg.    EYE SURGERY     IR FLUORO GUIDE PORT INSERTION RIGHT  08/12/2017   IR REMOVAL TUN ACCESS W/ PORT W/O FL MOD SED  01/29/2018   IR  US GUIDE VASC ACCESS RIGHT  08/12/2017   SQUAMOUS CELL CARCINOMA EXCISION  12/28/2020   Excised from top of head   THUMB ARTHROSCOPY     TOTAL HIP ARTHROPLASTY  2010   FAMILY HISTORY Family History  Problem Relation Age of Onset   Renal Disease Mother    Cancer Mother        colon cancer   Cancer Father        colon cancer   SOCIAL HISTORY Social History   Tobacco Use   Smoking status: Never   Smokeless tobacco: Never  Vaping Use   Vaping Use: Never used  Substance Use Topics   Alcohol use: No   Drug use: No       OPHTHALMIC EXAM: Base Eye Exam     Visual Acuity (Snellen - Linear)       Right Left   Dist cc 20/20 20/20         Tonometry (Tonopen, 2:28 PM)       Right Left   Pressure 14 13         Pupils       Dark Light Shape React APD   Right 3 2 Round Brisk None   Left 3 2 Round Brisk None         Visual Fields (Counting fingers)       Left Right    Full Full         Extraocular Movement       Right Left    Full, Ortho Full, Ortho         Neuro/Psych     Oriented x3: Yes   Mood/Affect: Normal         Dilation     Both eyes: 1.0% Mydriacyl, 2.5% Phenylephrine @ 2:27 PM           Slit Lamp and Fundus Exam     Slit Lamp Exam       Right Left   Lids/Lashes Dermatochalasis - upper lid, mild Meibomian gland dysfunction Dermatochalasis - upper lid, mild Meibomian gland dysfunction   Conjunctiva/Sclera White and quiet White and quiet   Cornea Trace Debris in tear film, well healed temporal cataract wounds, 1+ pigmented Guttata, trace PEE well healed temporal cataract wounds, 2+ Punctate epithelial erosions   Anterior Chamber Deep and quiet Deep and quiet   Iris Round and dilated Round and dilated   Lens PC IOL in good position, trace PCO PC IOL in good position   Anterior Vitreous Trace Vitreous syneresis, Posterior vitreous detachment Trace Vitreous syneresis, Posterior vitreous detachment         Fundus Exam        Right Left   Disc Compact, trace pallor, Sharp rim, mild PPA Compact, Pink and Sharp, temporal PPP  C/D Ratio 0.1 0.2   Macula Blunted foveal reflex, Drusen, RPE mottling, clumping and early atrophy, shallow PED with trace SRF -- improved Blunted foveal reflex, +CNV, Drusen, RPE mottling and clumping; trace shallow SRF -- improved, no heme   Vessels attenuated, Tortuous attenuated, Tortuous   Periphery Attached; mild reticular degeneration, no heme Attached, reticular degeneration, No heme           Refraction     Wearing Rx       Sphere Cylinder Axis Add   Right -0.25 +0.75 165 +2.50   Left -0.50 +0.50 012 +2.50    Type: PAL           IMAGING AND PROCEDURES  Imaging and Procedures for 03/05/2023  OCT, Retina - OU - Both Eyes       Right Eye Quality was good. Central Foveal Thickness: 258. Progression has improved. Findings include normal foveal contour, no IRF, no SRF, retinal drusen , subretinal hyper-reflective material, intraretinal hyper-reflective material, pigment epithelial detachment, outer retinal atrophy (stable improvement in low PED temporal macula, interval improvement in shallow SRF and IRHM overlying stable PED, no IRF).   Left Eye Quality was good. Central Foveal Thickness: 300. Progression has improved. Findings include no IRF, no SRF, abnormal foveal contour, retinal drusen , subretinal hyper-reflective material, choroidal neovascular membrane, pigment epithelial detachment, outer retinal atrophy (Interval improvement in SRF overlying stable PED).   Notes *Images captured and stored on drive  Diagnosis / Impression:  OD: exu ARMD -- stable improvement in low PED temporal macula, interval improvement in shallow SRF and IRHM overlying stable PED, no IRF OS: exu ARMD -- Interval improvement in SRF overlying stable PED  Clinical management:  See below  Abbreviations: NFP - Normal foveal profile. CME - cystoid macular edema. PED - pigment epithelial  detachment. IRF - intraretinal fluid. SRF - subretinal fluid. EZ - ellipsoid zone. ERM - epiretinal membrane. ORA - outer retinal atrophy. ORT - outer retinal tubulation. SRHM - subretinal hyper-reflective material. IRHM - intraretinal hyper-reflective material      Intravitreal Injection, Pharmacologic Agent - OS - Left Eye       Time Out 03/05/2023. 3:13 PM. Confirmed correct patient, procedure, site, and patient consented.   Anesthesia Topical anesthesia was used. Anesthetic medications included Lidocaine 2%, Proparacaine 0.5%.   Procedure Preparation included 5% betadine to ocular surface, eyelid speculum. A (32g) needle was used.   Injection: 6 mg faricimab-svoa 6 MG/0.05ML   Route: Intravitreal, Site: Left Eye   NDC: O8010301, Lot: [id number], Expiration date: 01/08/2025, Waste: 0 mL   Post-op Post injection exam found visual acuity of at least counting fingers. The patient tolerated the procedure well. There were no complications. The patient received written and verbal post procedure care education.            ASSESSMENT/PLAN:    ICD-10-CM   1. Exudative age-related macular degeneration of left eye with active choroidal neovascularization  H35.3221 OCT, Retina - OU - Both Eyes    Intravitreal Injection, Pharmacologic Agent - OS - Left Eye    faricimab-svoa (VABYSMO) 6mg /0.65mL intravitreal injection    2. Exudative age-related macular degeneration of right eye with active choroidal neovascularization  H35.3211 CANCELED: Intravitreal Injection, Pharmacologic Agent - OS - Left Eye    3. Essential hypertension  I10     4. Hypertensive retinopathy of both eyes  H35.033     5. Pseudophakia, both eyes  Z96.1      1. Exudative age related macular degeneration,  OS  - [facility] pt with known history of nonexudative ARMD OU  - acute "haze over vision" OS -- onset, Friday 11.12.21 -- initial exam with central subretinal heme  - s/p IVA OS #1 (11.17.21), #2  (12.15.21), #3 (01.12.22), #4 (02.09.22), #5 (03.16.22), #6 (04.20.22), #7 (06.01.22), #8 (06.29.22)--IVA resistance  - s/p IVE OS #1 (07.27.22), #2 (08.24.22), #3 (09.21.22), #4 (10.19.22), #5 (11.16.22), #6 (12.16.22), #7 (01.18.23), #8 (02.15.23)--IVE resistance  - s/p IVV OS #1 SAMPLE (03.15.23), #2 (04.12.23), #3 (05.10.23), #4 (06.07.23), #5 (07.19.23), #6 (08.22.23), #7 (09.27.23), #8 (12.20.23), #9 (01.31.24), #10 (03.13.24)  - BCVA 20/20 OS -- improved from 20/25  - exam and OCT show OS: Interval improvement in SRF overlying stable PED  - recommend IVV OS #11 today, 04.24.24 with follow up in 6 weeks again - pt wishes to be treated with IVV OS - RBA of procedure discussed, questions answered - IVV informed consent obtained and signed 03.15.23 (OS) - see procedure note   - f/u in 6 wks -- DFE/OCT, possible injection  2. Exudative age related macular degeneration OD  - conversion to exudative ARMD noted on 03.15.23 exam  - exam with new SRH temporal macula -- stably improved today  - s/p IVE OD #1 (03.15.23), #2 (04.12.23), #3 (05.10.23), #4 (06.07.23), #5 (07.19.23), #6 (08.19.23), #7 (09.27.23), #8 (11.01.23), #9 (03.11.24)  - BCVA 20/20 OD -- improved from 20/25  - OCT OD interval improvement in shallow SRF and IRHM overlying stable PED, no IRF - recommend holding IVE OD today - working plan is maintenance treatment q3-4 months  - pt in agreement - IVE informed consent obtained and signed, 03.15.23 (OD)  - f/u in 6 weeks  3,4. Hypertensive retinopathy OU - discussed importance of tight BP control - monitor     5. Pseudophakia OU  - s/p CE/IOL OU  - IOLs in good position, doing well  - monitor   Ophthalmic Meds Ordered this visit:  Meds ordered this encounter  Medications   faricimab-svoa (VABYSMO) /0.39mL intravitreal injection     Return in about 6 weeks (around 04/16/2023) for f/u exu ARMD OU, DFE, OCT, possible injxn.  There are no Patient Instructions on file  for this visit.  This document serves as a record of services personally performed by Karie Chimera, MD, PhD. It was created on their behalf by De Blanch, an ophthalmic technician. The creation of this record is the provider's dictation and/or activities during the visit.    Electronically signed by: De Blanch, OA, 03/05/23  3:40 PM  This document serves as a record of services personally performed by Karie Chimera, MD, PhD. It was created on their behalf by Glee Arvin. Manson Passey, OA an ophthalmic technician. The creation of this record is the provider's dictation and/or activities during the visit.    Electronically signed by: Glee Arvin. Manson Passey, [place] 04.24.2024 3:40 PM  Karie Chimera, M.D., Ph.D. Diseases & Surgery of the Retina and Vitreous [facility]  I have reviewed the above documentation for accuracy and completeness, and I agree with the above. Karie Chimera, M.D., Ph.D. 03/05/23 3:40 PM   Abbreviations: M myopia (nearsighted); A astigmatism; H hyperopia (farsighted); P presbyopia; Mrx spectacle prescription;  CTL contact lenses; OD right eye; OS left eye; OU both eyes  XT exotropia; ET esotropia; PEK punctate epithelial keratitis; PEE punctate epithelial erosions; DES dry eye syndrome; MGD meibomian gland dysfunction; ATs artificial tears; PFAT's preservative free artificial tears;  NSC nuclear sclerotic cataract; PSC posterior subcapsular cataract; ERM epi-retinal membrane; PVD posterior vitreous detachment; RD retinal detachment; DM diabetes mellitus; DR diabetic retinopathy; NPDR non-proliferative diabetic retinopathy; PDR proliferative diabetic retinopathy; CSME clinically significant macular edema; DME diabetic macular edema; dbh dot blot hemorrhages; CWS cotton wool spot; POAG primary open angle glaucoma; C/D cup-to-disc ratio; HVF humphrey visual field; GVF goldmann visual field; OCT optical coherence tomography; IOP intraocular pressure; BRVO  Branch retinal vein occlusion; CRVO central retinal vein occlusion; CRAO central retinal artery occlusion; BRAO branch retinal artery occlusion; RT retinal tear; SB scleral buckle; PPV pars plana vitrectomy; VH Vitreous hemorrhage; PRP panretinal laser photocoagulation; IVK intravitreal kenalog; VMT vitreomacular traction; MH Macular hole;  NVD neovascularization of the disc; NVE neovascularization elsewhere; AREDS age related eye disease study; ARMD age related macular degeneration; POAG primary open angle glaucoma; EBMD epithelial/anterior basement membrane dystrophy; ACIOL anterior chamber intraocular lens; IOL intraocular lens; PCIOL posterior chamber intraocular lens; Phaco/IOL phacoemulsification with intraocular lens placement; PRK photorefractive keratectomy; LASIK laser assisted in situ keratomileusis; HTN hypertension; DM diabetes mellitus; COPD chronic obstructive pulmonary disease

## 2023-03-03 NOTE — Assessment & Plan Note (Signed)
I have reviewed imaging studies with the patient She has significant disease burden and with her recent symptoms of night sweats, I recommend resumption of treatment We reviewed the guidelines We discussed the risk, benefits, side effects of Calquence and she is in agreement to proceed I recommend we start Calquence on May 1 I also recommend prophylactic antiviral with acyclovir and allopurinol for tumor lysis prophylaxis I plan to see her on May 10 for further review and toxicity check

## 2023-03-03 NOTE — Progress Notes (Signed)
[facility] OFFICE PROGRESS NOTE  Patient Care Team: Gweneth Dimitri, MD as PCP - General (Family Medicine) O'Neal, Ronnald Ramp, MD as PCP - Cardiology (Cardiology) Bjorn Pippin, MD as Attending Physician (Urology) Othella Boyer, MD as Attending Physician (Cardiology) Artis Delay, MD as Consulting Physician (Hematology and Oncology)  ASSESSMENT & PLAN:  CLL (chronic lymphocytic leukemia) I have reviewed imaging studies with the patient She has significant disease burden and with her recent symptoms of night sweats, I recommend resumption of treatment We reviewed the guidelines We discussed the risk, benefits, side effects of Calquence and she is in agreement to proceed I recommend we start Calquence on May 1 I also recommend prophylactic antiviral with acyclovir and allopurinol for tumor lysis prophylaxis I plan to see her on May 10 for further review and toxicity check  No orders of the defined types were placed in this encounter.   All questions were answered. The patient knows to call the clinic with any problems, questions or concerns. The total time spent in the appointment was 30 minutes encounter with patients including review of chart and various tests results, discussions about plan of care and coordination of care plan   Artis Delay, MD 03/03/2023 10:59 AM  INTERVAL HISTORY: Please see below for problem oriented charting. she returns for treatment follow-up and review of test results She felt fine since her last visit We spent majority of our time reviewing imaging studies and discussed next step  REVIEW OF SYSTEMS:   Constitutional: Denies fevers, chills or abnormal weight loss Eyes: Denies blurriness of vision Ears, nose, mouth, throat, and face: Denies mucositis or sore throat Respiratory: Denies cough, dyspnea or wheezes Cardiovascular: Denies palpitation, chest discomfort or lower extremity swelling Gastrointestinal:  Denies nausea, heartburn or  change in bowel habits Skin: Denies abnormal skin rashes Lymphatics: Denies new lymphadenopathy or easy bruising Neurological:Denies numbness, tingling or new weaknesses Behavioral/Psych: Mood is stable, no new changes  All other systems were reviewed with the patient and are negative.  I have reviewed the past medical history, past surgical history, social history and family history with the patient and they are unchanged from previous note.  ALLERGIES:  is allergic to codeine, demerol [meperidine], morphine and related, ciprofloxacin, and sulfa antibiotics.  MEDICATIONS:  Current Outpatient Medications  Medication Sig Dispense Refill   acalabrutinib (CALQUENCE) 100 MG capsule Take 1 capsule (100 mg total) by mouth 2 (two) times daily. 60 capsule 11   acyclovir (ZOVIRAX) 400 MG tablet Take 1 tablet (400 mg total) by mouth daily. 30 tablet 6   allopurinol (ZYLOPRIM) 300 MG tablet Take 1 tablet (300 mg total) by mouth daily. 30 tablet 0   acetaminophen (TYLENOL) 500 MG tablet Take 500 mg by mouth every 6 (six) hours as needed for pain.     amLODipine (NORVASC) 5 MG tablet Take 5 mg by mouth daily.     atorvastatin (LIPITOR) 40 MG tablet Take 1 tablet by mouth daily.     Calcium Carb-Cholecalciferol (CALCIUM + D3) 600-200 MG-UNIT TABS Take 1 tablet by mouth 3 (three) times a week.      fluticasone (FLONASE) 50 MCG/ACT nasal spray Place 2 sprays into the nose daily.     folic acid (FOLVITE) 1 MG tablet Take 1 mg by mouth daily.     hydrochlorothiazide (HYDRODIURIL) 25 MG tablet Take 25 mg by mouth daily.     metoprolol (LOPRESSOR) 50 MG tablet Take 25 mg by mouth 2 (two) times daily.  Multiple Vitamin (MULTIVITAMIN) tablet Take 1 tablet by mouth daily.     Multiple Vitamins-Minerals (EYE VITAMINS & MINERALS) TABS Take by mouth.     potassium chloride SA (K-DUR,KLOR-CON) 20 MEQ tablet Take 20 mEq by mouth 3 (three) times a week. Mon, Wed, Fri     No current facility-administered  medications for this visit.    SUMMARY OF ONCOLOGIC HISTORY: Oncology History Overview Note  Del 13q   CLL (chronic lymphocytic leukemia)  02/09/2013 Pathology Results   Peripheral Blood Flow Cytometry - FINDINGS CONSISTENT WITH CHRONIC LYMPHOCYTIC LEUKEMIA   09/02/2013 Pathology Results   FISH positive for deletion 13 q   05/22/2017 Imaging   Worsening mesenteric and retroperitoneal adenopathy concerning for worsening lymphoproliferative disorder/lymphoma.   Small cysts in the liver and kidneys are stable.   Prior cholecystectomy.   Aortic atherosclerosis.   Left colonic diverticulosis without active diverticulitis.   08/11/2017 Imaging   Chest Impression:  1. Mild mediastinal and hilar lymphadenopathy. 2. Clusters small axial lymph nodes.  Abdomen / Pelvis Impression:  1. Mild periaortic retroperitoneal adenopathy and moderate central mesenteric adenopathy not changed comparison CT. 2. Mild iliac adenopathy 3. Normal volume spleen. 4. No explanation for RIGHT lower quadrant pain. Cluster of lymph nodes in the ileocecal mesenteries similar to prior.   08/12/2017 Procedure   Placement of single lumen port a cath via right internal jugular vein. The catheter tip lies at the cavo-atrial junction. A power injectable port a cath was placed and is ready for immediate use.   08/14/2017 - 11/14/2017 Chemotherapy   She received Bendamustine and Rituxan   11/12/2017 Imaging   1. Continued decrease in mediastinal and hilar lymph nodes with interval resolution of the abdominal and pelvic lymphadenopathy seen on the prior study. There is no lymphadenopathy in the chest, abdomen, or pelvis by CT size criteria on today's study. No new or progressive interval findings. 2.  Aortic Atherosclerois (ICD10-170.0) 3. Hepatic and renal cysts, stable.   12/18/2017 Adverse Reaction   We are unable to resume cycle 5 of chemotherapy due to prolonged pancytopenia   01/29/2018 Procedure   Successful  right IJ vein Port-A-Cath explant.   02/25/2023 Imaging   CT CHEST ABDOMEN PELVIS W CONTRAST  Result Date: 02/25/2023 CLINICAL DATA:  77 year old female with history of CLL originally diagnosed in 2019. Increasing white blood cell count and lymphadenopathy. * Tracking Code: BO * EXAM: CT CHEST, ABDOMEN, AND PELVIS WITH CONTRAST TECHNIQUE: Multidetector CT imaging of the chest, abdomen and pelvis was performed following the standard protocol during bolus administration of intravenous contrast. RADIATION DOSE REDUCTION: This exam was performed according to the departmental dose-optimization program which includes automated exposure control, adjustment of the mA and/or kV according to patient size and/or use of iterative reconstruction technique. CONTRAST:  OMNIPAQUE IOHEXOL 300 MG/ML  SOLN COMPARISON:  CT of the chest, abdomen and pelvis 11/12/2017. FINDINGS: CT CHEST FINDINGS Cardiovascular: Heart size is normal. There is no significant pericardial fluid, thickening or pericardial calcification. Aortic atherosclerosis. No definite coronary artery calcifications. Mediastinum/Nodes: Multiple enlarged and borderline enlarged mediastinal and bilateral hilar lymph nodes are noted measuring up to 1.6 cm in short axis in the low right paratracheal nodal station and 1.4 cm in short axis in the right hilar region. Multiple enlarged and borderline enlarged bilateral axillary and subpectoral lymph nodes are also noted measuring up to 1 cm in the left axillary region (axial image 12 of series 2). Esophagus is unremarkable in appearance. Lungs/Pleura: 3 mm right lower  lobe pulmonary nodule (axial image 110 of series 4). No other larger more suspicious appearing pulmonary nodules or masses are noted. No acute consolidative airspace disease. No pleural effusions. Musculoskeletal: There are no aggressive appearing lytic or blastic lesions noted in the visualized portions of the skeleton. CT ABDOMEN PELVIS FINDINGS  Hepatobiliary: Multiple small well-defined low-attenuation lesions scattered throughout the hepatic parenchyma. The largest of these are compatible with cysts, measuring up to 1.4 cm in diameter. The smaller lesions are too small to definitively characterize, but also favored represent small cysts and/or biliary hamartomas. Less well-defined areas of low attenuation are also noted, particularly adjacent to the gallbladder fossa, where the largest of these measures 2.0 x 1.5 cm (axial image 61 of series 2), slightly more pronounced than the prior examination from 2019, incompletely characterize, but favored to represent focal fatty infiltration. Status post cholecystectomy. No intrahepatic biliary ductal dilatation. Common bile duct measures 1.3 cm in the porta hepatis, likely reflective of benign post cholecystectomy physiology. Pancreas: No pancreatic mass. No pancreatic ductal dilatation. No pancreatic or peripancreatic fluid collections or inflammatory changes. Spleen: Spleen is enlarged measuring 12.4 x 6.2 x 10.4 cm (estimated splenic volume of 400 mL) . Adrenals/Urinary Tract: Subcentimeter low-attenuation lesions in both kidneys, too small to definitively characterize, but statistically likely to represent tiny cysts (no imaging follow-up recommended). No hydroureteronephrosis. Urinary bladder is normal in appearance. Bilateral adrenal glands are normal in appearance. Stomach/Bowel: Normal appearance of the stomach. No pathologic dilatation of small bowel or colon. Numerous colonic diverticula are noted, without surrounding inflammatory changes to suggest an acute diverticulitis at this time. Vascular/Lymphatic: Atherosclerosis in the abdominal aorta and pelvic vasculature, without evidence of aneurysm or dissection. Extensive lymphadenopathy is noted in the abdomen and pelvis, predominantly in the small bowel mesentery where the largest lymph nodes measure up to 2.2 cm in short axis (axial image 76 of series  2). Multiple prominent borderline enlarged and mildly enlarged retroperitoneal lymph nodes are also noted measuring up to 1.4 cm in short axis in the low left para-aortic nodal station (axial image 75 of series 2). The largest pelvic lymph nodes are in the left obturator nodal station (axial image 96 of series 2) measuring 1.3 cm in short axis. Reproductive: Status post hysterectomy. Ovaries are not confidently identified may be surgically absent or atrophic. Other: No significant volume of ascites.  No pneumoperitoneum. Musculoskeletal: Status post right hip hip arthroplasty. There are no aggressive appearing lytic or blastic lesions noted in the visualized portions of the skeleton. IMPRESSION: 1. Extensive lymphadenopathy throughout the chest, abdomen and pelvis, as above, compatible with recurrent disease. 2. Mild splenomegaly. 3. Poorly defined low-attenuation regions in the liver, most evident in segment 5 adjacent to the gallbladder fossa, favored to represent areas of focal fatty infiltration. Attention on follow-up imaging is recommended to ensure stability. Alternatively, definitive characterization could be obtained with MRI of the abdomen with and without IV gadolinium if of clinical concern. 4. 3 mm right lower lobe pulmonary nodule, nonspecific, but statistically likely benign. Attention at time of routine imaging follow-up is recommended to ensure stability or regression. 5. Aortic atherosclerosis. 6. Additional incidental imaging findings, as above. Electronically Signed   By: Trudie Reed M.D.   On: 02/25/2023 08:44   MM 3D SCREENING MAMMOGRAM BILATERAL BREAST  Result Date: 02/18/2023 CLINICAL DATA:  Screening. EXAM: DIGITAL SCREENING BILATERAL MAMMOGRAM WITH TOMOSYNTHESIS AND CAD TECHNIQUE: Bilateral screening digital craniocaudal and mediolateral oblique mammograms were obtained. Bilateral screening digital breast tomosynthesis was performed. The  images were evaluated with computer-aided  detection. COMPARISON:  Previous exam(s). ACR Breast Density Category b: There are scattered areas of fibroglandular density. FINDINGS: There are no findings suspicious for malignancy. IMPRESSION: No mammographic evidence of malignancy. A result letter of this screening mammogram will be mailed directly to the patient. RECOMMENDATION: Screening mammogram in one year. (Code:SM-B-01Y) BI-RADS CATEGORY  1: Negative. Electronically Signed   By: Sherian Rein M.D.   On: 02/18/2023 11:26        PHYSICAL EXAMINATION: ECOG PERFORMANCE STATUS: 1 - Symptomatic but completely ambulatory  Vitals:   03/03/23 1040  BP: 125/61  Pulse: 66  Resp: 18  Temp: 98.3 F (36.8 C)  SpO2: 99%   Filed Weights   03/03/23 1040  Weight: 146 lb 6.4 oz (66.4 kg)    GENERAL:alert, no distress and comfortable  NEURO: alert & oriented x 3 with fluent speech, no focal motor/sensory deficits  LABORATORY DATA:  I have reviewed the data as listed    Component Value Date/Time   NA 141 09/26/2022 0903   NA 141 11/13/2017 0753   K 3.7 09/26/2022 0903   K 4.1 11/13/2017 0753   CL 103 09/26/2022 0903   CL 104 02/08/2013 1454   CO2 32 09/26/2022 0903   CO2 27 11/13/2017 0753   GLUCOSE 108 (H) 09/26/2022 0903   GLUCOSE 113 11/13/2017 0753   GLUCOSE 103 (H) 02/08/2013 1454   BUN 17 09/26/2022 0903   BUN 11.6 11/13/2017 0753   CREATININE 0.88 09/26/2022 0903   CREATININE 0.8 11/13/2017 0753   CALCIUM 10.1 09/26/2022 0903   CALCIUM 9.3 11/13/2017 0753   PROT 7.5 09/26/2022 0903   PROT 6.2 (L) 11/13/2017 0753   ALBUMIN 4.7 09/26/2022 0903   ALBUMIN 3.3 (L) 11/13/2017 0753   AST 21 09/26/2022 0903   AST 24 11/13/2017 0753   ALT 18 09/26/2022 0903   ALT 24 11/13/2017 0753   ALKPHOS 75 09/26/2022 0903   ALKPHOS 83 11/13/2017 0753   BILITOT 0.7 09/26/2022 0903   BILITOT 0.38 11/13/2017 0753   GFRNONAA >60 09/26/2022 0903   GFRAA >60 01/12/2019 0938    No results found for: "SPEP", "UPEP"  Lab Results   Component Value Date   WBC 28.3 (H) 09/26/2022   NEUTROABS 3.9 09/26/2022   HGB 14.2 09/26/2022   HCT 42.5 09/26/2022   MCV 82.7 09/26/2022   PLT 155 09/26/2022      Chemistry      Component Value Date/Time   NA 141 09/26/2022 0903   NA 141 11/13/2017 0753   K 3.7 09/26/2022 0903   K 4.1 11/13/2017 0753   CL 103 09/26/2022 0903   CL 104 02/08/2013 1454   CO2 32 09/26/2022 0903   CO2 27 11/13/2017 0753   BUN 17 09/26/2022 0903   BUN 11.6 11/13/2017 0753   CREATININE 0.88 09/26/2022 0903   CREATININE 0.8 11/13/2017 0753      Component Value Date/Time   CALCIUM 10.1 09/26/2022 0903   CALCIUM 9.3 11/13/2017 0753   ALKPHOS 75 09/26/2022 0903   ALKPHOS 83 11/13/2017 0753   AST 21 09/26/2022 0903   AST 24 11/13/2017 0753   ALT 18 09/26/2022 0903   ALT 24 11/13/2017 0753   BILITOT 0.7 09/26/2022 0903   BILITOT 0.38 11/13/2017 0753       RADIOGRAPHIC STUDIES: I have reviewed imaging studies with the patient I have personally reviewed the radiological images as listed and agreed with the findings in the report. CT  CHEST ABDOMEN PELVIS W CONTRAST  Result Date: 02/25/2023 CLINICAL DATA:  77 year old female with history of CLL originally diagnosed in 2019. Increasing white blood cell count and lymphadenopathy. * Tracking Code: BO * EXAM: CT CHEST, ABDOMEN, AND PELVIS WITH CONTRAST TECHNIQUE: Multidetector CT imaging of the chest, abdomen and pelvis was performed following the standard protocol during bolus administration of intravenous contrast. RADIATION DOSE REDUCTION: This exam was performed according to the departmental dose-optimization program which includes automated exposure control, adjustment of the mA and/or kV according to patient size and/or use of iterative reconstruction technique. CONTRAST:  OMNIPAQUE IOHEXOL 300 MG/ML  SOLN COMPARISON:  CT of the chest, abdomen and pelvis 11/12/2017. FINDINGS: CT CHEST FINDINGS Cardiovascular: Heart size is normal. There is no  significant pericardial fluid, thickening or pericardial calcification. Aortic atherosclerosis. No definite coronary artery calcifications. Mediastinum/Nodes: Multiple enlarged and borderline enlarged mediastinal and bilateral hilar lymph nodes are noted measuring up to 1.6 cm in short axis in the low right paratracheal nodal station and 1.4 cm in short axis in the right hilar region. Multiple enlarged and borderline enlarged bilateral axillary and subpectoral lymph nodes are also noted measuring up to 1 cm in the left axillary region (axial image 12 of series 2). Esophagus is unremarkable in appearance. Lungs/Pleura: 3 mm right lower lobe pulmonary nodule (axial image 110 of series 4). No other larger more suspicious appearing pulmonary nodules or masses are noted. No acute consolidative airspace disease. No pleural effusions. Musculoskeletal: There are no aggressive appearing lytic or blastic lesions noted in the visualized portions of the skeleton. CT ABDOMEN PELVIS FINDINGS Hepatobiliary: Multiple small well-defined low-attenuation lesions scattered throughout the hepatic parenchyma. The largest of these are compatible with cysts, measuring up to 1.4 cm in diameter. The smaller lesions are too small to definitively characterize, but also favored represent small cysts and/or biliary hamartomas. Less well-defined areas of low attenuation are also noted, particularly adjacent to the gallbladder fossa, where the largest of these measures 2.0 x 1.5 cm (axial image 61 of series 2), slightly more pronounced than the prior examination from 2019, incompletely characterize, but favored to represent focal fatty infiltration. Status post cholecystectomy. No intrahepatic biliary ductal dilatation. Common bile duct measures 1.3 cm in the porta hepatis, likely reflective of benign post cholecystectomy physiology. Pancreas: No pancreatic mass. No pancreatic ductal dilatation. No pancreatic or peripancreatic fluid collections or  inflammatory changes. Spleen: Spleen is enlarged measuring 12.4 x 6.2 x 10.4 cm (estimated splenic volume of 400 mL) . Adrenals/Urinary Tract: Subcentimeter low-attenuation lesions in both kidneys, too small to definitively characterize, but statistically likely to represent tiny cysts (no imaging follow-up recommended). No hydroureteronephrosis. Urinary bladder is normal in appearance. Bilateral adrenal glands are normal in appearance. Stomach/Bowel: Normal appearance of the stomach. No pathologic dilatation of small bowel or colon. Numerous colonic diverticula are noted, without surrounding inflammatory changes to suggest an acute diverticulitis at this time. Vascular/Lymphatic: Atherosclerosis in the abdominal aorta and pelvic vasculature, without evidence of aneurysm or dissection. Extensive lymphadenopathy is noted in the abdomen and pelvis, predominantly in the small bowel mesentery where the largest lymph nodes measure up to 2.2 cm in short axis (axial image 76 of series 2). Multiple prominent borderline enlarged and mildly enlarged retroperitoneal lymph nodes are also noted measuring up to 1.4 cm in short axis in the low left para-aortic nodal station (axial image 75 of series 2). The largest pelvic lymph nodes are in the left obturator nodal station (axial image 96 of series 2)  measuring 1.3 cm in short axis. Reproductive: Status post hysterectomy. Ovaries are not confidently identified may be surgically absent or atrophic. Other: No significant volume of ascites.  No pneumoperitoneum. Musculoskeletal: Status post right hip hip arthroplasty. There are no aggressive appearing lytic or blastic lesions noted in the visualized portions of the skeleton. IMPRESSION: 1. Extensive lymphadenopathy throughout the chest, abdomen and pelvis, as above, compatible with recurrent disease. 2. Mild splenomegaly. 3. Poorly defined low-attenuation regions in the liver, most evident in segment 5 adjacent to the gallbladder  fossa, favored to represent areas of focal fatty infiltration. Attention on follow-up imaging is recommended to ensure stability. Alternatively, definitive characterization could be obtained with MRI of the abdomen with and without IV gadolinium if of clinical concern. 4. 3 mm right lower lobe pulmonary nodule, nonspecific, but statistically likely benign. Attention at time of routine imaging follow-up is recommended to ensure stability or regression. 5. Aortic atherosclerosis. 6. Additional incidental imaging findings, as above. Electronically Signed   By: Trudie Reed M.D.   On: 02/25/2023 08:44   MM 3D SCREENING MAMMOGRAM BILATERAL BREAST  Result Date: 02/18/2023 CLINICAL DATA:  Screening. EXAM: DIGITAL SCREENING BILATERAL MAMMOGRAM WITH TOMOSYNTHESIS AND CAD TECHNIQUE: Bilateral screening digital craniocaudal and mediolateral oblique mammograms were obtained. Bilateral screening digital breast tomosynthesis was performed. The images were evaluated with computer-aided detection. COMPARISON:  Previous exam(s). ACR Breast Density Category b: There are scattered areas of fibroglandular density. FINDINGS: There are no findings suspicious for malignancy. IMPRESSION: No mammographic evidence of malignancy. A result letter of this screening mammogram will be mailed directly to the patient. RECOMMENDATION: Screening mammogram in one year. (Code:SM-B-01Y) BI-RADS CATEGORY  1: Negative. Electronically Signed   By: Sherian Rein M.D.   On: 02/18/2023 11:26

## 2023-03-04 NOTE — Telephone Encounter (Signed)
Oral Oncology Pharmacist Encounter  Received new prescription for Calquence for the treatment of CLL, planned duration until disease progression or unacceptable toxicity.  Labs from 09/26/22 assessed, appropriate for initiation. No interventions needed. Prescription dose and frequency assessed for appropriateness.  Current medication list in Epic reviewed, DDIs with Calquence capsules identified: Calcium Carbonate-Cholecalciferol & Multivitamin: co-administration of Calquence with antacids may result in decreased effects of Calquence. This interaction is only seen for Calquence capsules. Prescription will be modified to Calquence tablets.    Evaluated chart and no patient barriers to medication adherence noted.   Patient agreement for treatment documented in MD note on 03/03/23. Planned initiation 03/12/23.  Prescription has been e-scribed to the [facility] for benefits analysis and approval.  Oral Oncology Clinic will continue to follow for insurance authorization, copayment issues, initial counseling and start date.  Lennie Muckle, PharmD Candidate 03/04/2023 8:32 AM Oral Oncology Clinic [phone]

## 2023-03-05 ENCOUNTER — Other Ambulatory Visit (HOSPITAL_COMMUNITY): Payer: Self-pay

## 2023-03-05 ENCOUNTER — Encounter (INDEPENDENT_AMBULATORY_CARE_PROVIDER_SITE_OTHER): Payer: Self-pay | Admitting: Ophthalmology

## 2023-03-05 ENCOUNTER — Ambulatory Visit (INDEPENDENT_AMBULATORY_CARE_PROVIDER_SITE_OTHER): Payer: Medicare Other | Admitting: Ophthalmology

## 2023-03-05 ENCOUNTER — Other Ambulatory Visit: Payer: Self-pay

## 2023-03-05 DIAGNOSIS — H353231 Exudative age-related macular degeneration, bilateral, with active choroidal neovascularization: Secondary | ICD-10-CM | POA: Diagnosis not present

## 2023-03-05 DIAGNOSIS — H353211 Exudative age-related macular degeneration, right eye, with active choroidal neovascularization: Secondary | ICD-10-CM

## 2023-03-05 DIAGNOSIS — H35033 Hypertensive retinopathy, bilateral: Secondary | ICD-10-CM | POA: Diagnosis not present

## 2023-03-05 DIAGNOSIS — I10 Essential (primary) hypertension: Secondary | ICD-10-CM

## 2023-03-05 DIAGNOSIS — Z961 Presence of intraocular lens: Secondary | ICD-10-CM

## 2023-03-05 DIAGNOSIS — H353132 Nonexudative age-related macular degeneration, bilateral, intermediate dry stage: Secondary | ICD-10-CM

## 2023-03-05 DIAGNOSIS — H353221 Exudative age-related macular degeneration, left eye, with active choroidal neovascularization: Secondary | ICD-10-CM

## 2023-03-05 MED ORDER — CALQUENCE 100 MG PO TABS
100.0000 mg | ORAL_TABLET | Freq: Two times a day (BID) | ORAL | 11 refills | Status: DC
Start: 2023-03-05 — End: 2024-02-23
  Filled 2023-03-05 – 2023-03-06 (×2): qty 60, 30d supply, fill #0
  Filled 2023-03-25: qty 60, 30d supply, fill #1
  Filled 2023-04-23: qty 60, 30d supply, fill #2
  Filled 2023-05-21: qty 60, 30d supply, fill #3
  Filled 2023-06-24 (×2): qty 60, 30d supply, fill #4
  Filled 2023-07-23: qty 60, 30d supply, fill #5
  Filled 2023-08-26: qty 60, 30d supply, fill #6
  Filled 2023-09-24: qty 60, 30d supply, fill #7
  Filled 2023-10-16: qty 60, 30d supply, fill #8
  Filled 2023-11-19: qty 60, 30d supply, fill #9
  Filled 2023-12-16: qty 60, 30d supply, fill #10
  Filled 2024-01-19 (×2): qty 60, 30d supply, fill #11

## 2023-03-05 MED ORDER — FARICIMAB-SVOA 6 MG/0.05ML IZ SOLN
6.0000 mg | INTRAVITREAL | Status: AC | PRN
Start: 2023-03-05 — End: 2023-03-05
  Administered 2023-03-05: 6 mg via INTRAVITREAL

## 2023-03-06 ENCOUNTER — Other Ambulatory Visit (HOSPITAL_COMMUNITY): Payer: Self-pay

## 2023-03-06 ENCOUNTER — Other Ambulatory Visit: Payer: Self-pay

## 2023-03-06 NOTE — Telephone Encounter (Signed)
Oral Chemotherapy Pharmacist Encounter   I spoke with patient for overview of: Calquence (acalabrutinib) for the treatment of CLL, planned duration until disease progression or unacceptable toxicity.   Counseled patient on administration, dosing, side effects, monitoring, drug-food interactions, safe handling, storage, and disposal. Patient had baseline labs obtained from PCP's office and were brought into clinic with Dr. Bertis Ruddy. Patient does not need new baseline labs.   Patient will take Calquence  tablets, 1 tablet by mouth approximately 12 hours apart, with or with out food, with a glass of water.  Patient knows to avoid acid suppressing agents, if needed, may seperate use of an H2RA blocker or antacid by 2 hours from Calquence admisinstration.  Calquence start date: 03/12/23  Adverse effects include but are not limited to: headache, diarrhea, fatigue, rash, muscle pain, bruising, decreased blood counts, and altered cardiac conduction.    Reviewed with patient importance of keeping a medication schedule and plan for any missed doses. No barriers to medication adherence identified.  Medication reconciliation performed and medication/allergy list updated.   Patient will also be receiving allopurinol and acyclovir from [facility] outpatient pharmacy for prophylaxis against TLS and viral infections. These medications will ship on 03/06/23 to deliver to the patient's home on 03/07/23.   Insurance authorization for [employer] has been obtained. Test claim at the pharmacy revealed copayment $0. This will ship from the [facility] outpatient pharmacy on 03/06/23 to deliver to patient's home on 03/07/23.  Patient informed the pharmacy will reach out 5-7 days prior to needing next fill of Calquence to coordinate continued medication acquisition to prevent break in therapy.  All questions answered.  Patient voiced understanding and appreciation.   Medication education handout placed in mail  for patient. Patient knows to call the office with questions or concerns. Oral Chemotherapy Clinic phone number provided to patient.   Lennie Muckle, PharmD Candidate 03/06/2023   10:30 AM Oral Oncology Clinic [phone]

## 2023-03-13 ENCOUNTER — Other Ambulatory Visit (HOSPITAL_COMMUNITY): Payer: Self-pay

## 2023-03-21 ENCOUNTER — Encounter: Payer: Self-pay | Admitting: Hematology and Oncology

## 2023-03-21 ENCOUNTER — Inpatient Hospital Stay: Payer: Medicare Other

## 2023-03-21 ENCOUNTER — Inpatient Hospital Stay: Payer: Medicare Other | Attending: Hematology and Oncology | Admitting: Hematology and Oncology

## 2023-03-21 ENCOUNTER — Other Ambulatory Visit: Payer: Self-pay

## 2023-03-21 VITALS — BP 128/59 | HR 67 | Temp 99.1°F | Resp 16 | Wt 144.4 lb

## 2023-03-21 DIAGNOSIS — C9110 Chronic lymphocytic leukemia of B-cell type not having achieved remission: Secondary | ICD-10-CM

## 2023-03-21 LAB — CMP (CANCER CENTER ONLY)
ALT: 14 U/L (ref 0–44)
AST: 16 U/L (ref 15–41)
Albumin: 4.5 g/dL (ref 3.5–5.0)
Alkaline Phosphatase: 80 U/L (ref 38–126)
Anion gap: 5 (ref 5–15)
BUN: 21 mg/dL (ref 8–23)
CO2: 32 mmol/L (ref 22–32)
Calcium: 10.1 mg/dL (ref 8.9–10.3)
Chloride: 104 mmol/L (ref 98–111)
Creatinine: 0.97 mg/dL (ref 0.44–1.00)
GFR, Estimated: 59 mL/min — ABNORMAL LOW (ref >60)
Glucose, Bld: 111 mg/dL — ABNORMAL HIGH (ref 70–99)
Potassium: 3.8 mmol/L (ref 3.5–5.1)
Sodium: 141 mmol/L (ref 135–145)
Total Bilirubin: 0.7 mg/dL (ref 0.3–1.2)
Total Protein: 7.3 g/dL (ref 6.5–8.1)

## 2023-03-21 LAB — CBC WITH DIFFERENTIAL (CANCER CENTER ONLY)
Abs Immature Granulocytes: 0.1 10*3/uL — ABNORMAL HIGH (ref 0.00–0.07)
Basophils Absolute: 0.4 10*3/uL — ABNORMAL HIGH (ref 0.0–0.1)
Basophils Relative: 1 %
Eosinophils Absolute: 0.4 10*3/uL (ref 0.0–0.5)
Eosinophils Relative: 1 %
HCT: 40.5 % (ref 36.0–46.0)
Hemoglobin: 13.2 g/dL (ref 12.0–15.0)
Immature Granulocytes: 0 %
Lymphocytes Relative: 92 %
Lymphs Abs: 70 10*3/uL — ABNORMAL HIGH (ref 0.7–4.0)
MCH: 28.3 pg (ref 26.0–34.0)
MCHC: 32.6 g/dL (ref 30.0–36.0)
MCV: 86.7 fL (ref 80.0–100.0)
Monocytes Absolute: 1.1 10*3/uL — ABNORMAL HIGH (ref 0.1–1.0)
Monocytes Relative: 1 %
Neutro Abs: 3.5 10*3/uL (ref 1.7–7.7)
Neutrophils Relative %: 5 %
Platelet Count: 166 10*3/uL (ref 150–400)
RBC: 4.67 MIL/uL (ref 3.87–5.11)
RDW: 14.4 % (ref 11.5–15.5)
Smear Review: NORMAL
WBC Count: 75.6 10*3/uL (ref 4.0–10.5)
nRBC: 0 % (ref 0.0–0.2)

## 2023-03-21 LAB — LACTATE DEHYDROGENASE: LDH: 119 U/L (ref 98–192)

## 2023-03-21 NOTE — Progress Notes (Signed)
[facility] OFFICE PROGRESS NOTE  Patient Care Team: Gweneth Dimitri, MD as PCP - General (Family Medicine) O'Neal, Ronnald Ramp, MD as PCP - Cardiology (Cardiology) Bjorn Pippin, MD as Attending Physician (Urology) Othella Boyer, MD as Attending Physician (Cardiology) Artis Delay, MD as Consulting Physician (Hematology and Oncology)  ASSESSMENT & PLAN:  CLL (chronic lymphocytic leukemia) So far, she tolerated treatment well without side effects except for fatigue I also recommend prophylactic antiviral with acyclovir and allopurinol for tumor lysis prophylaxis I plan to see her again in about 3 weeks for further follow-up  No orders of the defined types were placed in this encounter.   All questions were answered. The patient knows to call the clinic with any problems, questions or concerns. The total time spent in the appointment was 20 minutes encounter with patients including review of chart and various tests results, discussions about plan of care and coordination of care plan   Artis Delay, MD 03/21/2023 2:55 PM  INTERVAL HISTORY: Please see below for problem oriented charting. she returns for treatment follow-up on Calquence She complains of fatigue Her chronic bruising is similar She denies recent infection or diarrhea issue We review test results and discussed leukocytosis to be expected from treatment  REVIEW OF SYSTEMS:   Constitutional: Denies fevers, chills or abnormal weight loss Eyes: Denies blurriness of vision Ears, nose, mouth, throat, and face: Denies mucositis or sore throat Respiratory: Denies cough, dyspnea or wheezes Cardiovascular: Denies palpitation, chest discomfort or lower extremity swelling Gastrointestinal:  Denies nausea, heartburn or change in bowel habits Skin: Denies abnormal skin rashes Lymphatics: Denies new lymphadenopathy or easy bruising Neurological:Denies numbness, tingling or new weaknesses Behavioral/Psych: Mood is  stable, no new changes  All other systems were reviewed with the patient and are negative.  I have reviewed the past medical history, past surgical history, social history and family history with the patient and they are unchanged from previous note.  ALLERGIES:  is allergic to codeine, demerol [meperidine], morphine and related, ciprofloxacin, and sulfa antibiotics.  MEDICATIONS:  Current Outpatient Medications  Medication Sig Dispense Refill   acalabrutinib maleate (CALQUENCE) 100 MG tablet Take 1 tablet (100 mg total) by mouth 2 (two) times daily. 60 tablet 11   acetaminophen (TYLENOL) 500 MG tablet Take 500 mg by mouth every 6 (six) hours as needed for pain.     acyclovir (ZOVIRAX) 400 MG tablet Take 1 tablet (400 mg total) by mouth daily. 30 tablet 6   allopurinol (ZYLOPRIM) 300 MG tablet Take 1 tablet (300 mg total) by mouth daily. 30 tablet 0   amLODipine (NORVASC) 5 MG tablet Take 5 mg by mouth daily.     atorvastatin (LIPITOR) 40 MG tablet Take 1 tablet by mouth daily.     Calcium Carb-Cholecalciferol (CALCIUM + D3) 600-200 MG-UNIT TABS Take 1 tablet by mouth 3 (three) times a week.      fluticasone (FLONASE) 50 MCG/ACT nasal spray Place 2 sprays into the nose daily.     folic acid (FOLVITE) 1 MG tablet Take 1 mg by mouth daily.     hydrochlorothiazide (HYDRODIURIL) 25 MG tablet Take 25 mg by mouth daily.     metoprolol (LOPRESSOR) 50 MG tablet Take 25 mg by mouth 2 (two) times daily.     Multiple Vitamin (MULTIVITAMIN) tablet Take 1 tablet by mouth daily.     Multiple Vitamins-Minerals (EYE VITAMINS & MINERALS) TABS Take by mouth.     potassium chloride SA (K-DUR,KLOR-CON) 20 MEQ tablet  Take 20 mEq by mouth 3 (three) times a week. Mon, Wed, Fri     No current facility-administered medications for this visit.    SUMMARY OF ONCOLOGIC HISTORY: Oncology History Overview Note  Del 13q   CLL (chronic lymphocytic leukemia) (HCC)  02/09/2013 Pathology Results   Peripheral Blood Flow  Cytometry - FINDINGS CONSISTENT WITH CHRONIC LYMPHOCYTIC LEUKEMIA   09/02/2013 Pathology Results   FISH positive for deletion 13 q   05/22/2017 Imaging   Worsening mesenteric and retroperitoneal adenopathy concerning for worsening lymphoproliferative disorder/lymphoma.   Small cysts in the liver and kidneys are stable.   Prior cholecystectomy.   Aortic atherosclerosis.   Left colonic diverticulosis without active diverticulitis.   08/11/2017 Imaging   Chest Impression:  1. Mild mediastinal and hilar lymphadenopathy. 2. Clusters small axial lymph nodes.  Abdomen / Pelvis Impression:  1. Mild periaortic retroperitoneal adenopathy and moderate central mesenteric adenopathy not changed comparison CT. 2. Mild iliac adenopathy 3. Normal volume spleen. 4. No explanation for RIGHT lower quadrant pain. Cluster of lymph nodes in the ileocecal mesenteries similar to prior.   08/12/2017 Procedure   Placement of single lumen port a cath via right internal jugular vein. The catheter tip lies at the cavo-atrial junction. A power injectable port a cath was placed and is ready for immediate use.   08/14/2017 - 11/14/2017 Chemotherapy   She received Bendamustine and Rituxan   11/12/2017 Imaging   1. Continued decrease in mediastinal and hilar lymph nodes with interval resolution of the abdominal and pelvic lymphadenopathy seen on the prior study. There is no lymphadenopathy in the chest, abdomen, or pelvis by CT size criteria on today's study. No new or progressive interval findings. 2.  Aortic Atherosclerois (ICD10-170.0) 3. Hepatic and renal cysts, stable.   12/18/2017 Adverse Reaction   We are unable to resume cycle 5 of chemotherapy due to prolonged pancytopenia   01/29/2018 Procedure   Successful right IJ vein Port-A-Cath explant.   02/25/2023 Imaging   CT CHEST ABDOMEN PELVIS W CONTRAST  Result Date: 02/25/2023 CLINICAL DATA:  77 year old female with history of CLL originally diagnosed in  2019. Increasing white blood cell count and lymphadenopathy. * Tracking Code: BO * EXAM: CT CHEST, ABDOMEN, AND PELVIS WITH CONTRAST TECHNIQUE: Multidetector CT imaging of the chest, abdomen and pelvis was performed following the standard protocol during bolus administration of intravenous contrast. RADIATION DOSE REDUCTION: This exam was performed according to the departmental dose-optimization program which includes automated exposure control, adjustment of the mA and/or kV according to patient size and/or use of iterative reconstruction technique. CONTRAST:  OMNIPAQUE IOHEXOL 300 MG/ML  SOLN COMPARISON:  CT of the chest, abdomen and pelvis 11/12/2017. FINDINGS: CT CHEST FINDINGS Cardiovascular: Heart size is normal. There is no significant pericardial fluid, thickening or pericardial calcification. Aortic atherosclerosis. No definite coronary artery calcifications. Mediastinum/Nodes: Multiple enlarged and borderline enlarged mediastinal and bilateral hilar lymph nodes are noted measuring up to 1.6 cm in short axis in the low right paratracheal nodal station and 1.4 cm in short axis in the right hilar region. Multiple enlarged and borderline enlarged bilateral axillary and subpectoral lymph nodes are also noted measuring up to 1 cm in the left axillary region (axial image 12 of series 2). Esophagus is unremarkable in appearance. Lungs/Pleura: 3 mm right lower lobe pulmonary nodule (axial image 110 of series 4). No other larger more suspicious appearing pulmonary nodules or masses are noted. No acute consolidative airspace disease. No pleural effusions. Musculoskeletal: There are no aggressive  appearing lytic or blastic lesions noted in the visualized portions of the skeleton. CT ABDOMEN PELVIS FINDINGS Hepatobiliary: Multiple small well-defined low-attenuation lesions scattered throughout the hepatic parenchyma. The largest of these are compatible with cysts, measuring up to 1.4 cm in diameter. The smaller  lesions are too small to definitively characterize, but also favored represent small cysts and/or biliary hamartomas. Less well-defined areas of low attenuation are also noted, particularly adjacent to the gallbladder fossa, where the largest of these measures 2.0 x 1.5 cm (axial image 61 of series 2), slightly more pronounced than the prior examination from 2019, incompletely characterize, but favored to represent focal fatty infiltration. Status post cholecystectomy. No intrahepatic biliary ductal dilatation. Common bile duct measures 1.3 cm in the porta hepatis, likely reflective of benign post cholecystectomy physiology. Pancreas: No pancreatic mass. No pancreatic ductal dilatation. No pancreatic or peripancreatic fluid collections or inflammatory changes. Spleen: Spleen is enlarged measuring 12.4 x 6.2 x 10.4 cm (estimated splenic volume of 400 mL) . Adrenals/Urinary Tract: Subcentimeter low-attenuation lesions in both kidneys, too small to definitively characterize, but statistically likely to represent tiny cysts (no imaging follow-up recommended). No hydroureteronephrosis. Urinary bladder is normal in appearance. Bilateral adrenal glands are normal in appearance. Stomach/Bowel: Normal appearance of the stomach. No pathologic dilatation of small bowel or colon. Numerous colonic diverticula are noted, without surrounding inflammatory changes to suggest an acute diverticulitis at this time. Vascular/Lymphatic: Atherosclerosis in the abdominal aorta and pelvic vasculature, without evidence of aneurysm or dissection. Extensive lymphadenopathy is noted in the abdomen and pelvis, predominantly in the small bowel mesentery where the largest lymph nodes measure up to 2.2 cm in short axis (axial image 76 of series 2). Multiple prominent borderline enlarged and mildly enlarged retroperitoneal lymph nodes are also noted measuring up to 1.4 cm in short axis in the low left para-aortic nodal station (axial image 75 of  series 2). The largest pelvic lymph nodes are in the left obturator nodal station (axial image 96 of series 2) measuring 1.3 cm in short axis. Reproductive: Status post hysterectomy. Ovaries are not confidently identified may be surgically absent or atrophic. Other: No significant volume of ascites.  No pneumoperitoneum. Musculoskeletal: Status post right hip hip arthroplasty. There are no aggressive appearing lytic or blastic lesions noted in the visualized portions of the skeleton. IMPRESSION: 1. Extensive lymphadenopathy throughout the chest, abdomen and pelvis, as above, compatible with recurrent disease. 2. Mild splenomegaly. 3. Poorly defined low-attenuation regions in the liver, most evident in segment 5 adjacent to the gallbladder fossa, favored to represent areas of focal fatty infiltration. Attention on follow-up imaging is recommended to ensure stability. Alternatively, definitive characterization could be obtained with MRI of the abdomen with and without IV gadolinium if of clinical concern. 4. 3 mm right lower lobe pulmonary nodule, nonspecific, but statistically likely benign. Attention at time of routine imaging follow-up is recommended to ensure stability or regression. 5. Aortic atherosclerosis. 6. Additional incidental imaging findings, as above. Electronically Signed   By: Trudie Reed M.D.   On: 02/25/2023 08:44   MM 3D SCREENING MAMMOGRAM BILATERAL BREAST  Result Date: 02/18/2023 CLINICAL DATA:  Screening. EXAM: DIGITAL SCREENING BILATERAL MAMMOGRAM WITH TOMOSYNTHESIS AND CAD TECHNIQUE: Bilateral screening digital craniocaudal and mediolateral oblique mammograms were obtained. Bilateral screening digital breast tomosynthesis was performed. The images were evaluated with computer-aided detection. COMPARISON:  Previous exam(s). ACR Breast Density Category b: There are scattered areas of fibroglandular density. FINDINGS: There are no findings suspicious for malignancy. IMPRESSION: No  mammographic  evidence of malignancy. A result letter of this screening mammogram will be mailed directly to the patient. RECOMMENDATION: Screening mammogram in one year. (Code:[id number]-B-01Y) BI-RADS CATEGORY  1: Negative. Electronically Signed   By: Sherian Rein M.D.   On: 02/18/2023 11:26      03/21/2023 -  Chemotherapy   She started on Calquence     PHYSICAL EXAMINATION: ECOG PERFORMANCE STATUS: 1 - Symptomatic but completely ambulatory  Vitals:   03/21/23 1052  BP: (!) 128/59  Pulse: 67  Resp: 16  Temp: 99.1 F (37.3 C)  SpO2: 97%   Filed Weights   03/21/23 1052  Weight: 144 lb 6.4 oz (65.5 kg)    GENERAL:alert, no distress and comfortable  NEURO: alert & oriented x 3 with fluent speech, no focal motor/sensory deficits  LABORATORY DATA:  I have reviewed the data as listed    Component Value Date/Time   NA 141 03/21/2023 1032   NA 141 11/13/2017 0753   K 3.8 03/21/2023 1032   K 4.1 11/13/2017 0753   CL 104 03/21/2023 1032   CL 104 02/08/2013 1454   CO2 32 03/21/2023 1032   CO2 27 11/13/2017 0753   GLUCOSE 111 (H) 03/21/2023 1032   GLUCOSE 113 11/13/2017 0753   GLUCOSE 103 (H) 02/08/2013 1454   BUN 21 03/21/2023 1032   BUN 11.6 11/13/2017 0753   CREATININE 0.97 03/21/2023 1032   CREATININE 0.8 11/13/2017 0753   CALCIUM 10.1 03/21/2023 1032   CALCIUM 9.3 11/13/2017 0753   PROT 7.3 03/21/2023 1032   PROT 6.2 (L) 11/13/2017 0753   ALBUMIN 4.5 03/21/2023 1032   ALBUMIN 3.3 (L) 11/13/2017 0753   AST 16 03/21/2023 1032   AST 24 11/13/2017 0753   ALT 14 03/21/2023 1032   ALT 24 11/13/2017 0753   ALKPHOS 80 03/21/2023 1032   ALKPHOS 83 11/13/2017 0753   BILITOT 0.7 03/21/2023 1032   BILITOT 0.38 11/13/2017 0753   GFRNONAA 59 (L) 03/21/2023 1032   GFRAA >60 01/12/2019 0938    No results found for: "SPEP", "UPEP"  Lab Results  Component Value Date   WBC 75.6 (HH) 03/21/2023   NEUTROABS 3.5 03/21/2023   HGB 13.2 03/21/2023   HCT 40.5 03/21/2023   MCV 86.7  03/21/2023   PLT 166 03/21/2023      Chemistry      Component Value Date/Time   NA 141 03/21/2023 1032   NA 141 11/13/2017 0753   K 3.8 03/21/2023 1032   K 4.1 11/13/2017 0753   CL 104 03/21/2023 1032   CL 104 02/08/2013 1454   CO2 32 03/21/2023 1032   CO2 27 11/13/2017 0753   BUN 21 03/21/2023 1032   BUN 11.6 11/13/2017 0753   CREATININE 0.97 03/21/2023 1032   CREATININE 0.8 11/13/2017 0753      Component Value Date/Time   CALCIUM 10.1 03/21/2023 1032   CALCIUM 9.3 11/13/2017 0753   ALKPHOS 80 03/21/2023 1032   ALKPHOS 83 11/13/2017 0753   AST 16 03/21/2023 1032   AST 24 11/13/2017 0753   ALT 14 03/21/2023 1032   ALT 24 11/13/2017 0753   BILITOT 0.7 03/21/2023 1032   BILITOT 0.38 11/13/2017 0753

## 2023-03-21 NOTE — Assessment & Plan Note (Signed)
So far, she tolerated treatment well without side effects except for fatigue I also recommend prophylactic antiviral with acyclovir and allopurinol for tumor lysis prophylaxis I plan to see her again in about 3 weeks for further follow-up

## 2023-03-21 NOTE — Progress Notes (Signed)
CRITICAL VALUE STICKER  CRITICAL VALUE: WBC 75.6  RECEIVER (on-site recipient of call): Tilford Pillar  DATE & TIME NOTIFIED: 03/21/23 at 1058  MESSENGER (representative from lab): Twana First  MD NOTIFIED: Dr. Bertis Ruddy  TIME OF NOTIFICATION: 03/21/23 1100  RESPONSE:  Will see in the office now.

## 2023-03-25 ENCOUNTER — Other Ambulatory Visit: Payer: Self-pay

## 2023-03-25 ENCOUNTER — Other Ambulatory Visit (HOSPITAL_COMMUNITY): Payer: Self-pay

## 2023-03-27 ENCOUNTER — Ambulatory Visit: Payer: Medicare Other | Admitting: Hematology and Oncology

## 2023-03-27 ENCOUNTER — Other Ambulatory Visit (HOSPITAL_COMMUNITY): Payer: Self-pay

## 2023-03-27 ENCOUNTER — Other Ambulatory Visit: Payer: Medicare Other

## 2023-03-31 ENCOUNTER — Other Ambulatory Visit (HOSPITAL_COMMUNITY): Payer: Self-pay

## 2023-03-31 ENCOUNTER — Other Ambulatory Visit: Payer: Self-pay | Admitting: Hematology and Oncology

## 2023-04-02 ENCOUNTER — Other Ambulatory Visit (HOSPITAL_COMMUNITY): Payer: Self-pay

## 2023-04-03 ENCOUNTER — Other Ambulatory Visit (HOSPITAL_COMMUNITY): Payer: Self-pay

## 2023-04-08 ENCOUNTER — Other Ambulatory Visit (HOSPITAL_COMMUNITY): Payer: Self-pay

## 2023-04-08 ENCOUNTER — Other Ambulatory Visit: Payer: Self-pay | Admitting: Hematology and Oncology

## 2023-04-09 ENCOUNTER — Other Ambulatory Visit: Payer: Self-pay

## 2023-04-09 DIAGNOSIS — C9110 Chronic lymphocytic leukemia of B-cell type not having achieved remission: Secondary | ICD-10-CM

## 2023-04-09 NOTE — Progress Notes (Signed)
[facility] - Clinic Note  04/11/2023    CHIEF COMPLAINT Patient presents for Retina Follow Up  HISTORY OF PRESENT ILLNESS: Amanda [name] is a 77 y.o. female who presents to the clinic today for:  HPI     Retina Follow Up   Patient presents with  Wet AMD.  In both eyes.  This started 6 weeks ago.  Duration of 6 weeks.  Since onset it is stable.  I, the attending physician,  performed the HPI with the patient and updated documentation appropriately.        Comments   6 week retina follow up AMD and IVV OS pt is reporting no vision changes noticed she denies any flashes has had some floaters       Last edited by Rennis Chris, MD on 04/11/2023  4:06 PM.    Pt states she is still being checked every 6 weeks for blood levels regarding leukemia  Referring physician: Alma Downs, PA-C [facility] [address] [place],  [place] [address]  HISTORICAL INFORMATION:  Selected notes from the MEDICAL RECORD NUMBER Referred by Alma Downs, PA on 11.16.21 for eval of dry to wet AMD conversion OS.   CURRENT MEDICATIONS: No current outpatient medications on file. (Ophthalmic Drugs)   No current facility-administered medications for this visit. (Ophthalmic Drugs)   Current Outpatient Medications (Other)  Medication Sig   acalabrutinib maleate (CALQUENCE) 100 MG tablet Take 1 tablet (100 mg total) by mouth 2 (two) times daily.   acetaminophen (TYLENOL) 500 MG tablet Take 500 mg by mouth every 6 (six) hours as needed for pain.   acyclovir (ZOVIRAX) 400 MG tablet Take 1 tablet (400 mg total) by mouth daily.   amLODipine (NORVASC) 5 MG tablet Take 5 mg by mouth daily.   atorvastatin (LIPITOR) 40 MG tablet Take 1 tablet by mouth daily.   Calcium Carb-Cholecalciferol (CALCIUM + D3) 600-200 MG-UNIT TABS Take 1 tablet by mouth 3 (three) times a week.    fluticasone (FLONASE) 50 MCG/ACT nasal spray Place 2 sprays into the nose daily.   folic  acid (FOLVITE) 1 MG tablet Take 1 mg by mouth daily.   hydrochlorothiazide (HYDRODIURIL) 25 MG tablet Take 25 mg by mouth daily.   metoprolol (LOPRESSOR) 50 MG tablet Take 25 mg by mouth 2 (two) times daily.   Multiple Vitamin (MULTIVITAMIN) tablet Take 1 tablet by mouth daily.   Multiple Vitamins-Minerals (EYE VITAMINS & MINERALS) TABS Take by mouth.   potassium chloride SA (K-DUR,KLOR-CON) 20 MEQ tablet Take 20 mEq by mouth 3 (three) times a week. Mon, Wed, Fri   No current facility-administered medications for this visit. (Other)   REVIEW OF SYSTEMS: ROS   Positive for: Cardiovascular, Eyes Negative for: Constitutional, Gastrointestinal, Neurological, Skin, Genitourinary, Musculoskeletal, HENT, Endocrine, Respiratory, Psychiatric, Allergic/Imm, Heme/Lymph Last edited by Etheleen Mayhew, COT on 04/11/2023  1:51 PM.     ALLERGIES Allergies  Allergen Reactions   Codeine Nausea And Vomiting   Demerol [Meperidine] Nausea And Vomiting   Morphine And Codeine Nausea And Vomiting   Ciprofloxacin Rash   Sulfa Antibiotics Rash   PAST MEDICAL HISTORY Past Medical History:  Diagnosis Date   A-fib (HCC)    Adenopathy    Atrial fibrillation (HCC)    CLL (chronic lymphocytic leukemia) (HCC) 08/31/2013   HTN (hypertension)    Hyperlipidemia    Hypertensive retinopathy    Lymphocytosis    Macular degeneration    OU  Past Surgical History:  Procedure Laterality Date   ABDOMINAL HYSTERECTOMY     endometriosis, fibroid   APPENDECTOMY     BREAST BIOPSY     benign cyst.   CATARACT EXTRACTION Bilateral 2019   CHOLECYSTECTOMY     COLONOSCOPY  11/2012   Per Dr. Matthias Hughs.  neg.    EYE SURGERY     IR FLUORO GUIDE PORT INSERTION RIGHT  08/12/2017   IR REMOVAL TUN ACCESS W/ PORT W/O FL MOD SED  01/29/2018   IR US GUIDE VASC ACCESS RIGHT  08/12/2017   SQUAMOUS CELL CARCINOMA EXCISION  12/28/2020   Excised from top of head   THUMB ARTHROSCOPY     TOTAL HIP ARTHROPLASTY  2010    FAMILY HISTORY Family History  Problem Relation Age of Onset   Renal Disease Mother    Cancer Mother        colon cancer   Cancer Father        colon cancer   SOCIAL HISTORY Social History   Tobacco Use   Smoking status: Never   Smokeless tobacco: Never  Vaping Use   Vaping Use: Never used  Substance Use Topics   Alcohol use: No   Drug use: No       OPHTHALMIC EXAM: Base Eye Exam     Visual Acuity (Snellen - Linear)       Right Left   Dist cc 20/20 20/25   Dist ph cc  NI         Tonometry (Tonopen, 1:54 PM)       Right Left   Pressure 17 15         Pupils       Pupils Dark Light Shape React APD   Right PERRL 3 2 Round Brisk None   Left PERRL 3 2 Round Brisk None         Visual Fields       Left Right    Full Full         Extraocular Movement       Right Left    Full, Ortho Full, Ortho         Neuro/Psych     Oriented x3: Yes   Mood/Affect: Normal         Dilation     Both eyes: 2.5% Phenylephrine @ 1:54 PM           Slit Lamp and Fundus Exam     Slit Lamp Exam       Right Left   Lids/Lashes Dermatochalasis - upper lid, mild Meibomian gland dysfunction Dermatochalasis - upper lid, mild Meibomian gland dysfunction   Conjunctiva/Sclera White and quiet White and quiet   Cornea Trace Debris in tear film, well healed temporal cataract wounds, 1+ pigmented Guttata, trace PEE well healed temporal cataract wounds, 2+ Punctate epithelial erosions   Anterior Chamber Deep and quiet Deep and quiet   Iris Round and dilated Round and dilated   Lens PC IOL in good position, trace PCO PC IOL in good position   Anterior Vitreous Trace Vitreous syneresis, Posterior vitreous detachment Trace Vitreous syneresis, Posterior vitreous detachment         Fundus Exam       Right Left   Disc Compact, trace pallor, Sharp rim, mild PPA Compact, Pink and Sharp, temporal PPP   C/D Ratio 0.1 0.2   Macula Blunted foveal reflex, Drusen, RPE  mottling, clumping and early atrophy, shallow PED with trace SRF --  stably improved Blunted foveal reflex, +CNV, Drusen, RPE mottling and clumping; trace shallow SRF, no heme, CWS superior macula   Vessels attenuated, Tortuous attenuated, Tortuous   Periphery Attached; mild reticular degeneration, no heme Attached, reticular degeneration, No heme           Refraction     Wearing Rx       Sphere Cylinder Axis Add   Right -0.25 +0.75 165 +2.50   Left -0.50 +0.50 012 +2.50    Type: PAL           IMAGING AND PROCEDURES  Imaging and Procedures for 04/11/2023  OCT, Retina - OU - Both Eyes       Right Eye Quality was good. Central Foveal Thickness: 257. Progression has been stable. Findings include normal foveal contour, no IRF, no SRF, retinal drusen , subretinal hyper-reflective material, intraretinal hyper-reflective material, pigment epithelial detachment, outer retinal atrophy (stable improvement in shallow SRF and IRHM overlying PED, no IRF, mild interval increase low PED temporal macula).   Left Eye Quality was good. Central Foveal Thickness: 300. Progression has been stable. Findings include no IRF, no SRF, abnormal foveal contour, retinal drusen , subretinal hyper-reflective material, choroidal neovascular membrane, pigment epithelial detachment, outer retinal atrophy (Trace persistent SRF overlying stable PED).   Notes *Images captured and stored on drive  Diagnosis / Impression:  OD: exu ARMD -- stable improvement in shallow SRF and IRHM overlying PED, no IRF, mild interval increase low PED temporal macula OS: exu ARMD -- Trace persistent SRF overlying stable PED  Clinical management:  See below  Abbreviations: NFP - Normal foveal profile. CME - cystoid macular edema. PED - pigment epithelial detachment. IRF - intraretinal fluid. SRF - subretinal fluid. EZ - ellipsoid zone. ERM - epiretinal membrane. ORA - outer retinal atrophy. ORT - outer retinal tubulation. SRHM -  subretinal hyper-reflective material. IRHM - intraretinal hyper-reflective material      Intravitreal Injection, Pharmacologic Agent - OS - Left Eye       Time Out 04/11/2023. 2:22 PM. Confirmed correct patient, procedure, site, and patient consented.   Anesthesia Topical anesthesia was used. Anesthetic medications included Lidocaine 2%, Proparacaine 0.5%.   Procedure Preparation included 5% betadine to ocular surface, eyelid speculum. A (32g) needle was used.   Injection: 6 mg faricimab-svoa 6 MG/0.05ML   Route: Intravitreal, Site: Left Eye   NDC: O8010301, Lot: [id number], Expiration date: 02/08/2025, Waste: 0 mL   Post-op Post injection exam found visual acuity of at least counting fingers. The patient tolerated the procedure well. There were no complications. The patient received written and verbal post procedure care education.            ASSESSMENT/PLAN:    ICD-10-CM   1. Exudative age-related macular degeneration of left eye with active choroidal neovascularization (HCC)  H35.3221 OCT, Retina - OU - Both Eyes    Intravitreal Injection, Pharmacologic Agent - OS - Left Eye    faricimab-svoa (VABYSMO) 6mg /0.90mL intravitreal injection    2. Exudative age-related macular degeneration of right eye with active choroidal neovascularization (HCC)  H35.3211     3. Essential hypertension  I10     4. Hypertensive retinopathy of both eyes  H35.033     5. Pseudophakia, both eyes  Z96.1      1. Exudative age related macular degeneration, OS  - [facility] pt with known history of nonexudative ARMD OU  - acute "haze over vision" OS -- onset, Friday 11.12.21 -- initial exam with central  subretinal heme  - s/p IVA OS #1 (11.17.21), #2 (12.15.21), #3 (01.12.22), #4 (02.09.22), #5 (03.16.22), #6 (04.20.22), #7 (06.01.22), #8 (06.29.22)--IVA resistance  - s/p IVE OS #1 (07.27.22), #2 (08.24.22), #3 (09.21.22), #4 (10.19.22), #5 (11.16.22), #6 (12.16.22), #7 (01.18.23), #8  (02.15.23)--IVE resistance  - s/p IVV OS #1 SAMPLE (03.15.23), #2 (04.12.23), #3 (05.10.23), #4 (06.07.23), #5 (07.19.23), #6 (08.22.23), #7 (09.27.23), #8 (12.20.23), #9 (01.31.24), #10 (03.13.24), #11 (04.24.24)  - BCVA 20/20 OS -- stable  - exam and OCT show OS: Trace persistent SRF overlying stable PED at 5.5 wks  - recommend IVV OS #12 today, 05.31.24 with follow up in 6 weeks - pt wishes to be treated with IVV OS - RBA of procedure discussed, questions answered - IVV informed consent obtained and signed 03.15.23 (OS) - see procedure note   - f/u July 8 or 9 -- DFE/OCT, possible injection  2. Exudative age related macular degeneration OD  - conversion to exudative ARMD noted on 03.15.23 exam  - exam with new SRH temporal macula -- stably improved today  - s/p IVE OD #1 (03.15.23), #2 (04.12.23), #3 (05.10.23), #4 (06.07.23), #5 (07.19.23), #6 (08.19.23), #7 (09.27.23), #8 (11.01.23), #9 (03.11.24)  - BCVA 20/20 OD -- stable  - OCT OD: stable improvement in shallow SRF and IRHM overlying PED, no IRF, mild interval increase low PED temporal macula at 2.5 mos since last injxn (03.11.24) - recommend holding IVE OD today - working plan is maintenance treatment q3-4 months  - pt in agreement - IVE informed consent obtained and signed, 03.15.23 (OD)  - f/u July 8 or 9th -- DFE/OCT, possible injxn  3,4. Hypertensive retinopathy OU - discussed importance of tight BP control - monitor     5. Pseudophakia OU  - s/p CE/IOL OU  - IOLs in good position, doing well  - monitor   Ophthalmic Meds Ordered this visit:  Meds ordered this encounter  Medications   faricimab-svoa (VABYSMO) 6mg /0.67mL intravitreal injection     Return for July 8 or 9th, exu ARMD OU, Dilated Exam, OCT, Possible Injxn.  There are no Patient Instructions on file for this visit.  This document serves as a record of services personally performed by Karie Chimera, MD, PhD. It was created on their behalf by Glee Arvin. Manson Passey, OA an ophthalmic technician. The creation of this record is the provider's dictation and/or activities during the visit.    Electronically signed by: Glee Arvin. [place], [place] 05.29.2024 4:08 PM  Karie Chimera, M.D., Ph.D. Diseases & Surgery of the Retina and Vitreous [facility]  I have reviewed the above documentation for accuracy and completeness, and I agree with the above. Karie Chimera, M.D., Ph.D. 04/11/23 4:09 PM   Abbreviations: M myopia (nearsighted); A astigmatism; H hyperopia (farsighted); P presbyopia; Mrx spectacle prescription;  CTL contact lenses; OD right eye; OS left eye; OU both eyes  XT exotropia; ET esotropia; PEK punctate epithelial keratitis; PEE punctate epithelial erosions; DES dry eye syndrome; MGD meibomian gland dysfunction; ATs artificial tears; PFAT's preservative free artificial tears; NSC nuclear sclerotic cataract; PSC posterior subcapsular cataract; ERM epi-retinal membrane; PVD posterior vitreous detachment; RD retinal detachment; DM diabetes mellitus; DR diabetic retinopathy; NPDR non-proliferative diabetic retinopathy; PDR proliferative diabetic retinopathy; CSME clinically significant macular edema; DME diabetic macular edema; dbh dot blot hemorrhages; CWS cotton wool spot; POAG primary open angle glaucoma; C/D cup-to-disc ratio; HVF humphrey visual field; GVF goldmann visual field; OCT optical coherence tomography; IOP  intraocular pressure; BRVO Branch retinal vein occlusion; CRVO central retinal vein occlusion; CRAO central retinal artery occlusion; BRAO branch retinal artery occlusion; RT retinal tear; SB scleral buckle; PPV pars plana vitrectomy; VH Vitreous hemorrhage; PRP panretinal laser photocoagulation; IVK intravitreal kenalog; VMT vitreomacular traction; MH Macular hole;  NVD neovascularization of the disc; NVE neovascularization elsewhere; AREDS age related eye disease study; ARMD age related macular degeneration; POAG  primary open angle glaucoma; EBMD epithelial/anterior basement membrane dystrophy; ACIOL anterior chamber intraocular lens; IOL intraocular lens; PCIOL posterior chamber intraocular lens; Phaco/IOL phacoemulsification with intraocular lens placement; PRK photorefractive keratectomy; LASIK laser assisted in situ keratomileusis; HTN hypertension; DM diabetes mellitus; COPD chronic obstructive pulmonary disease

## 2023-04-10 ENCOUNTER — Encounter: Payer: Self-pay | Admitting: Hematology and Oncology

## 2023-04-10 ENCOUNTER — Other Ambulatory Visit: Payer: Self-pay | Admitting: Hematology and Oncology

## 2023-04-10 ENCOUNTER — Inpatient Hospital Stay (HOSPITAL_BASED_OUTPATIENT_CLINIC_OR_DEPARTMENT_OTHER): Payer: Medicare Other | Admitting: Hematology and Oncology

## 2023-04-10 ENCOUNTER — Inpatient Hospital Stay: Payer: Medicare Other

## 2023-04-10 ENCOUNTER — Other Ambulatory Visit (HOSPITAL_COMMUNITY): Payer: Self-pay

## 2023-04-10 VITALS — BP 125/54 | HR 61 | Temp 98.9°F | Resp 17 | Wt 144.8 lb

## 2023-04-10 DIAGNOSIS — C9110 Chronic lymphocytic leukemia of B-cell type not having achieved remission: Secondary | ICD-10-CM

## 2023-04-10 LAB — CBC WITH DIFFERENTIAL (CANCER CENTER ONLY)
Abs Immature Granulocytes: 0.16 10*3/uL — ABNORMAL HIGH (ref 0.00–0.07)
Basophils Absolute: 0.1 10*3/uL (ref 0.0–0.1)
Basophils Relative: 0 %
Eosinophils Absolute: 0.8 10*3/uL — ABNORMAL HIGH (ref 0.0–0.5)
Eosinophils Relative: 1 %
HCT: 38.3 % (ref 36.0–46.0)
Hemoglobin: 12 g/dL (ref 12.0–15.0)
Immature Granulocytes: 0 %
Lymphocytes Relative: 95 %
Lymphs Abs: 112.2 10*3/uL — ABNORMAL HIGH (ref 0.7–4.0)
MCH: 28.1 pg (ref 26.0–34.0)
MCHC: 31.3 g/dL (ref 30.0–36.0)
MCV: 89.7 fL (ref 80.0–100.0)
Monocytes Absolute: 0.5 10*3/uL (ref 0.1–1.0)
Monocytes Relative: 0 %
Neutro Abs: 4.2 10*3/uL (ref 1.7–7.7)
Neutrophils Relative %: 4 %
Platelet Count: 181 10*3/uL (ref 150–400)
RBC: 4.27 MIL/uL (ref 3.87–5.11)
RDW: 15.9 % — ABNORMAL HIGH (ref 11.5–15.5)
Smear Review: NORMAL
WBC Count: 118.1 10*3/uL (ref 4.0–10.5)
nRBC: 0 % (ref 0.0–0.2)

## 2023-04-10 LAB — CMP (CANCER CENTER ONLY)
ALT: 14 U/L (ref 0–44)
AST: 16 U/L (ref 15–41)
Albumin: 4.4 g/dL (ref 3.5–5.0)
Alkaline Phosphatase: 74 U/L (ref 38–126)
Anion gap: 5 (ref 5–15)
BUN: 20 mg/dL (ref 8–23)
CO2: 30 mmol/L (ref 22–32)
Calcium: 9.7 mg/dL (ref 8.9–10.3)
Chloride: 106 mmol/L (ref 98–111)
Creatinine: 0.84 mg/dL (ref 0.44–1.00)
GFR, Estimated: >60 mL/min (ref >60)
Glucose, Bld: 100 mg/dL — ABNORMAL HIGH (ref 70–99)
Potassium: 4 mmol/L (ref 3.5–5.1)
Sodium: 141 mmol/L (ref 135–145)
Total Bilirubin: 0.7 mg/dL (ref 0.3–1.2)
Total Protein: 6.9 g/dL (ref 6.5–8.1)

## 2023-04-10 NOTE — Progress Notes (Signed)
CRITICAL VALUE STICKER  CRITICAL VALUE: WBC 118.1  RECEIVER (on-site recipient of call): Tilford Pillar, RN  DATE & TIME NOTIFIED: 04/10/23 at 1142  MESSENGER (representative from lab): Norwood Levo  MD NOTIFIED: Dr. Bertis Ruddy  TIME OF NOTIFICATION: 04/10/23 at 1146  RESPONSE: Will see in the office now.

## 2023-04-10 NOTE — Progress Notes (Signed)
[facility] OFFICE PROGRESS NOTE  Patient Care Team: Gweneth Dimitri, MD as PCP - General (Family Medicine) O'Neal, Ronnald Ramp, MD as PCP - Cardiology (Cardiology) Bjorn Pippin, MD as Attending Physician (Urology) Othella Boyer, MD as Attending Physician (Cardiology) Artis Delay, MD as Consulting Physician (Hematology and Oncology)  ASSESSMENT & PLAN:  CLL (chronic lymphocytic leukemia) So far, she tolerated treatment well without side effects except for fatigue I also recommend prophylactic antiviral with acyclovir  She does not need to continue on allopurinol I plan to see her again in about 6 weeks for further follow-up  No orders of the defined types were placed in this encounter.   All questions were answered. The patient knows to call the clinic with any problems, questions or concerns. The total time spent in the appointment was 20 minutes encounter with patients including review of chart and various tests results, discussions about plan of care and coordination of care plan   Artis Delay, MD 04/10/2023 1:28 PM  INTERVAL HISTORY: Please see below for problem oriented charting. she returns for treatment follow-up on Calquence She tolerated treatment well She has some mild fatigue No nausea or changes in bowel habits Denies recent bleeding  REVIEW OF SYSTEMS:   Constitutional: Denies fevers, chills or abnormal weight loss Eyes: Denies blurriness of vision Ears, nose, mouth, throat, and face: Denies mucositis or sore throat Respiratory: Denies cough, dyspnea or wheezes Cardiovascular: Denies palpitation, chest discomfort or lower extremity swelling Gastrointestinal:  Denies nausea, heartburn or change in bowel habits Skin: Denies abnormal skin rashes Lymphatics: Denies new lymphadenopathy or easy bruising Neurological:Denies numbness, tingling or new weaknesses Behavioral/Psych: Mood is stable, no new changes  All other systems were reviewed with the  patient and are negative.  I have reviewed the past medical history, past surgical history, social history and family history with the patient and they are unchanged from previous note.  ALLERGIES:  is allergic to codeine, demerol [meperidine], morphine and codeine, ciprofloxacin, and sulfa antibiotics.  MEDICATIONS:  Current Outpatient Medications  Medication Sig Dispense Refill   acalabrutinib maleate (CALQUENCE) 100 MG tablet Take 1 tablet (100 mg total) by mouth 2 (two) times daily. 60 tablet 11   acetaminophen (TYLENOL) 500 MG tablet Take 500 mg by mouth every 6 (six) hours as needed for pain.     acyclovir (ZOVIRAX) 400 MG tablet Take 1 tablet (400 mg total) by mouth daily. 30 tablet 6   amLODipine (NORVASC) 5 MG tablet Take 5 mg by mouth daily.     atorvastatin (LIPITOR) 40 MG tablet Take 1 tablet by mouth daily.     Calcium Carb-Cholecalciferol (CALCIUM + D3) 600-200 MG-UNIT TABS Take 1 tablet by mouth 3 (three) times a week.      fluticasone (FLONASE) 50 MCG/ACT nasal spray Place 2 sprays into the nose daily.     folic acid (FOLVITE) 1 MG tablet Take 1 mg by mouth daily.     hydrochlorothiazide (HYDRODIURIL) 25 MG tablet Take 25 mg by mouth daily.     metoprolol (LOPRESSOR) 50 MG tablet Take 25 mg by mouth 2 (two) times daily.     Multiple Vitamin (MULTIVITAMIN) tablet Take 1 tablet by mouth daily.     Multiple Vitamins-Minerals (EYE VITAMINS & MINERALS) TABS Take by mouth.     potassium chloride SA (K-DUR,KLOR-CON) 20 MEQ tablet Take 20 mEq by mouth 3 (three) times a week. Mon, Wed, Fri     No current facility-administered medications for this visit.  SUMMARY OF ONCOLOGIC HISTORY: Oncology History Overview Note  Del 13q   CLL (chronic lymphocytic leukemia) (HCC)  02/09/2013 Pathology Results   Peripheral Blood Flow Cytometry - FINDINGS CONSISTENT WITH CHRONIC LYMPHOCYTIC LEUKEMIA   09/02/2013 Pathology Results   FISH positive for deletion 13 q   05/22/2017 Imaging    Worsening mesenteric and retroperitoneal adenopathy concerning for worsening lymphoproliferative disorder/lymphoma.   Small cysts in the liver and kidneys are stable.   Prior cholecystectomy.   Aortic atherosclerosis.   Left colonic diverticulosis without active diverticulitis.   08/11/2017 Imaging   Chest Impression:  1. Mild mediastinal and hilar lymphadenopathy. 2. Clusters small axial lymph nodes.  Abdomen / Pelvis Impression:  1. Mild periaortic retroperitoneal adenopathy and moderate central mesenteric adenopathy not changed comparison CT. 2. Mild iliac adenopathy 3. Normal volume spleen. 4. No explanation for RIGHT lower quadrant pain. Cluster of lymph nodes in the ileocecal mesenteries similar to prior.   08/12/2017 Procedure   Placement of single lumen port a cath via right internal jugular vein. The catheter tip lies at the cavo-atrial junction. A power injectable port a cath was placed and is ready for immediate use.   08/14/2017 - 11/14/2017 Chemotherapy   She received Bendamustine and Rituxan   11/12/2017 Imaging   1. Continued decrease in mediastinal and hilar lymph nodes with interval resolution of the abdominal and pelvic lymphadenopathy seen on the prior study. There is no lymphadenopathy in the chest, abdomen, or pelvis by CT size criteria on today's study. No new or progressive interval findings. 2.  Aortic Atherosclerois (ICD10-170.0) 3. Hepatic and renal cysts, stable.   12/18/2017 Adverse Reaction   We are unable to resume cycle 5 of chemotherapy due to prolonged pancytopenia   01/29/2018 Procedure   Successful right IJ vein Port-A-Cath explant.   02/25/2023 Imaging   CT CHEST ABDOMEN PELVIS W CONTRAST  Result Date: 02/25/2023 CLINICAL DATA:  77 year old female with history of CLL originally diagnosed in 2019. Increasing white blood cell count and lymphadenopathy. * Tracking Code: BO * EXAM: CT CHEST, ABDOMEN, AND PELVIS WITH CONTRAST TECHNIQUE: Multidetector CT  imaging of the chest, abdomen and pelvis was performed following the standard protocol during bolus administration of intravenous contrast. RADIATION DOSE REDUCTION: This exam was performed according to the departmental dose-optimization program which includes automated exposure control, adjustment of the mA and/or kV according to patient size and/or use of iterative reconstruction technique. CONTRAST:  OMNIPAQUE IOHEXOL 300 MG/ML  SOLN COMPARISON:  CT of the chest, abdomen and pelvis 11/12/2017. FINDINGS: CT CHEST FINDINGS Cardiovascular: Heart size is normal. There is no significant pericardial fluid, thickening or pericardial calcification. Aortic atherosclerosis. No definite coronary artery calcifications. Mediastinum/Nodes: Multiple enlarged and borderline enlarged mediastinal and bilateral hilar lymph nodes are noted measuring up to 1.6 cm in short axis in the low right paratracheal nodal station and 1.4 cm in short axis in the right hilar region. Multiple enlarged and borderline enlarged bilateral axillary and subpectoral lymph nodes are also noted measuring up to 1 cm in the left axillary region (axial image 12 of series 2). Esophagus is unremarkable in appearance. Lungs/Pleura: 3 mm right lower lobe pulmonary nodule (axial image 110 of series 4). No other larger more suspicious appearing pulmonary nodules or masses are noted. No acute consolidative airspace disease. No pleural effusions. Musculoskeletal: There are no aggressive appearing lytic or blastic lesions noted in the visualized portions of the skeleton. CT ABDOMEN PELVIS FINDINGS Hepatobiliary: Multiple small well-defined low-attenuation lesions scattered throughout the hepatic  parenchyma. The largest of these are compatible with cysts, measuring up to 1.4 cm in diameter. The smaller lesions are too small to definitively characterize, but also favored represent small cysts and/or biliary hamartomas. Less well-defined areas of low attenuation  are also noted, particularly adjacent to the gallbladder fossa, where the largest of these measures 2.0 x 1.5 cm (axial image 61 of series 2), slightly more pronounced than the prior examination from 2019, incompletely characterize, but favored to represent focal fatty infiltration. Status post cholecystectomy. No intrahepatic biliary ductal dilatation. Common bile duct measures 1.3 cm in the porta hepatis, likely reflective of benign post cholecystectomy physiology. Pancreas: No pancreatic mass. No pancreatic ductal dilatation. No pancreatic or peripancreatic fluid collections or inflammatory changes. Spleen: Spleen is enlarged measuring 12.4 x 6.2 x 10.4 cm (estimated splenic volume of 400 mL) . Adrenals/Urinary Tract: Subcentimeter low-attenuation lesions in both kidneys, too small to definitively characterize, but statistically likely to represent tiny cysts (no imaging follow-up recommended). No hydroureteronephrosis. Urinary bladder is normal in appearance. Bilateral adrenal glands are normal in appearance. Stomach/Bowel: Normal appearance of the stomach. No pathologic dilatation of small bowel or colon. Numerous colonic diverticula are noted, without surrounding inflammatory changes to suggest an acute diverticulitis at this time. Vascular/Lymphatic: Atherosclerosis in the abdominal aorta and pelvic vasculature, without evidence of aneurysm or dissection. Extensive lymphadenopathy is noted in the abdomen and pelvis, predominantly in the small bowel mesentery where the largest lymph nodes measure up to 2.2 cm in short axis (axial image 76 of series 2). Multiple prominent borderline enlarged and mildly enlarged retroperitoneal lymph nodes are also noted measuring up to 1.4 cm in short axis in the low left para-aortic nodal station (axial image 75 of series 2). The largest pelvic lymph nodes are in the left obturator nodal station (axial image 96 of series 2) measuring 1.3 cm in short axis. Reproductive: Status  post hysterectomy. Ovaries are not confidently identified may be surgically absent or atrophic. Other: No significant volume of ascites.  No pneumoperitoneum. Musculoskeletal: Status post right hip hip arthroplasty. There are no aggressive appearing lytic or blastic lesions noted in the visualized portions of the skeleton. IMPRESSION: 1. Extensive lymphadenopathy throughout the chest, abdomen and pelvis, as above, compatible with recurrent disease. 2. Mild splenomegaly. 3. Poorly defined low-attenuation regions in the liver, most evident in segment 5 adjacent to the gallbladder fossa, favored to represent areas of focal fatty infiltration. Attention on follow-up imaging is recommended to ensure stability. Alternatively, definitive characterization could be obtained with MRI of the abdomen with and without IV gadolinium if of clinical concern. 4. 3 mm right lower lobe pulmonary nodule, nonspecific, but statistically likely benign. Attention at time of routine imaging follow-up is recommended to ensure stability or regression. 5. Aortic atherosclerosis. 6. Additional incidental imaging findings, as above. Electronically Signed   By: Trudie Reed M.D.   On: 02/25/2023 08:44   MM 3D SCREENING MAMMOGRAM BILATERAL BREAST  Result Date: 02/18/2023 CLINICAL DATA:  Screening. EXAM: DIGITAL SCREENING BILATERAL MAMMOGRAM WITH TOMOSYNTHESIS AND CAD TECHNIQUE: Bilateral screening digital craniocaudal and mediolateral oblique mammograms were obtained. Bilateral screening digital breast tomosynthesis was performed. The images were evaluated with computer-aided detection. COMPARISON:  Previous exam(s). ACR Breast Density Category b: There are scattered areas of fibroglandular density. FINDINGS: There are no findings suspicious for malignancy. IMPRESSION: No mammographic evidence of malignancy. A result letter of this screening mammogram will be mailed directly to the patient. RECOMMENDATION: Screening mammogram in one year.  (Code:SM-B-01Y) BI-RADS CATEGORY  1: Negative. Electronically Signed   By: Sherian Rein M.D.   On: 02/18/2023 11:26      03/21/2023 -  Chemotherapy   She started on Calquence     PHYSICAL EXAMINATION: ECOG PERFORMANCE STATUS: 1 - Symptomatic but completely ambulatory  Vitals:   04/10/23 1145  BP: (!) 125/54  Pulse: 61  Resp: 17  Temp: 98.9 F (37.2 C)  SpO2: 98%   Filed Weights   04/10/23 1145  Weight: 144 lb 12.8 oz (65.7 kg)    GENERAL:alert, no distress and comfortable  NEURO: alert & oriented x 3 with fluent speech, no focal motor/sensory deficits  LABORATORY DATA:  I have reviewed the data as listed    Component Value Date/Time   NA 141 04/10/2023 1128   NA 141 11/13/2017 0753   K 4.0 04/10/2023 1128   K 4.1 11/13/2017 0753   CL 106 04/10/2023 1128   CL 104 02/08/2013 1454   CO2 30 04/10/2023 1128   CO2 27 11/13/2017 0753   GLUCOSE 100 (H) 04/10/2023 1128   GLUCOSE 113 11/13/2017 0753   GLUCOSE 103 (H) 02/08/2013 1454   BUN 20 04/10/2023 1128   BUN 11.6 11/13/2017 0753   CREATININE 0.84 04/10/2023 1128   CREATININE 0.8 11/13/2017 0753   CALCIUM 9.7 04/10/2023 1128   CALCIUM 9.3 11/13/2017 0753   PROT 6.9 04/10/2023 1128   PROT 6.2 (L) 11/13/2017 0753   ALBUMIN 4.4 04/10/2023 1128   ALBUMIN 3.3 (L) 11/13/2017 0753   AST 16 04/10/2023 1128   AST 24 11/13/2017 0753   ALT 14 04/10/2023 1128   ALT 24 11/13/2017 0753   ALKPHOS 74 04/10/2023 1128   ALKPHOS 83 11/13/2017 0753   BILITOT 0.7 04/10/2023 1128   BILITOT 0.38 11/13/2017 0753   GFRNONAA >60 04/10/2023 1128   GFRAA >60 01/12/2019 0938    No results found for: "SPEP", "UPEP"  Lab Results  Component Value Date   WBC 118.1 (HH) 04/10/2023   NEUTROABS 4.2 04/10/2023   HGB 12.0 04/10/2023   HCT 38.3 04/10/2023   MCV 89.7 04/10/2023   PLT 181 04/10/2023      Chemistry      Component Value Date/Time   NA 141 04/10/2023 1128   NA 141 11/13/2017 0753   K 4.0 04/10/2023 1128   K 4.1  11/13/2017 0753   CL 106 04/10/2023 1128   CL 104 02/08/2013 1454   CO2 30 04/10/2023 1128   CO2 27 11/13/2017 0753   BUN 20 04/10/2023 1128   BUN 11.6 11/13/2017 0753   CREATININE 0.84 04/10/2023 1128   CREATININE 0.8 11/13/2017 0753      Component Value Date/Time   CALCIUM 9.7 04/10/2023 1128   CALCIUM 9.3 11/13/2017 0753   ALKPHOS 74 04/10/2023 1128   ALKPHOS 83 11/13/2017 0753   AST 16 04/10/2023 1128   AST 24 11/13/2017 0753   ALT 14 04/10/2023 1128   ALT 24 11/13/2017 0753   BILITOT 0.7 04/10/2023 1128   BILITOT 0.38 11/13/2017 0753

## 2023-04-10 NOTE — Assessment & Plan Note (Signed)
So far, she tolerated treatment well without side effects except for fatigue I also recommend prophylactic antiviral with acyclovir  She does not need to continue on allopurinol I plan to see her again in about 6 weeks for further follow-up

## 2023-04-11 ENCOUNTER — Ambulatory Visit (INDEPENDENT_AMBULATORY_CARE_PROVIDER_SITE_OTHER): Payer: Medicare Other | Admitting: Ophthalmology

## 2023-04-11 ENCOUNTER — Encounter (INDEPENDENT_AMBULATORY_CARE_PROVIDER_SITE_OTHER): Payer: Self-pay | Admitting: Ophthalmology

## 2023-04-11 DIAGNOSIS — H353231 Exudative age-related macular degeneration, bilateral, with active choroidal neovascularization: Secondary | ICD-10-CM

## 2023-04-11 DIAGNOSIS — Z961 Presence of intraocular lens: Secondary | ICD-10-CM | POA: Diagnosis not present

## 2023-04-11 DIAGNOSIS — H353211 Exudative age-related macular degeneration, right eye, with active choroidal neovascularization: Secondary | ICD-10-CM

## 2023-04-11 DIAGNOSIS — I10 Essential (primary) hypertension: Secondary | ICD-10-CM | POA: Diagnosis not present

## 2023-04-11 DIAGNOSIS — H353221 Exudative age-related macular degeneration, left eye, with active choroidal neovascularization: Secondary | ICD-10-CM

## 2023-04-11 DIAGNOSIS — H35033 Hypertensive retinopathy, bilateral: Secondary | ICD-10-CM | POA: Diagnosis not present

## 2023-04-11 MED ORDER — FARICIMAB-SVOA 6 MG/0.05ML IZ SOLN
6.0000 mg | INTRAVITREAL | Status: AC | PRN
Start: 2023-04-11 — End: 2023-04-11
  Administered 2023-04-11: 6 mg via INTRAVITREAL

## 2023-04-20 NOTE — Progress Notes (Signed)
Cardiology Office Note:   Date:  04/20/2023  NAME:  Amanda [name]    MRN: [id number] DOB:  30-Aug-1946   PCP:  Gweneth Dimitri, MD  Cardiologist:  Reatha Harps, MD  Electrophysiologist:  None   Referring MD: Gweneth Dimitri, MD   No chief complaint on file. ***  History of Present Illness:   Amanda [name] is a 77 y.o. female with a hx of pAF who presents for follow-up.   Problem List 1. CLL 2. SVT/Paroxysmal Afib  -Diagnosed 2012, no recurrence  3. HTN  Past Medical History: Past Medical History:  Diagnosis Date   A-fib (HCC)    Adenopathy    Atrial fibrillation (HCC)    CLL (chronic lymphocytic leukemia) (HCC) 08/31/2013   HTN (hypertension)    Hyperlipidemia    Hypertensive retinopathy    Lymphocytosis    Macular degeneration    OU    Past Surgical History: Past Surgical History:  Procedure Laterality Date   ABDOMINAL HYSTERECTOMY     endometriosis, fibroid   APPENDECTOMY     BREAST BIOPSY     benign cyst.   CATARACT EXTRACTION Bilateral 2019   CHOLECYSTECTOMY     COLONOSCOPY  11/2012   Per Dr. Matthias Hughs.  neg.    EYE SURGERY     IR FLUORO GUIDE PORT INSERTION RIGHT  08/12/2017   IR REMOVAL TUN ACCESS W/ PORT W/O FL MOD SED  01/29/2018   IR US GUIDE VASC ACCESS RIGHT  08/12/2017   SQUAMOUS CELL CARCINOMA EXCISION  12/28/2020   Excised from top of head   THUMB ARTHROSCOPY     TOTAL HIP ARTHROPLASTY  2010    Current Medications: No outpatient medications have been marked as taking for the 04/21/23 encounter (Appointment) with Sande Rives, MD.     Allergies:    Codeine, Demerol [meperidine], Morphine and codeine, Ciprofloxacin, and Sulfa antibiotics   Social History: Social History   Socioeconomic History   Marital status: Widowed    Spouse name: Not on file   Number of children: 2   Years of education: Not on file   Highest education level: Not on file  Occupational History   Occupation: retired Engineer, civil (consulting)    Comment: retired [employer]; med  surgery.   Tobacco Use   Smoking status: Never   Smokeless tobacco: Never  Vaping Use   Vaping Use: Never used  Substance and Sexual Activity   Alcohol use: No   Drug use: No   Sexual activity: Not on file  Other Topics Concern   Not on file  Social History Narrative   Niece Angelic Peter is MPOA   Social Determinants of Health   Financial Resource Strain: Not on file  Food Insecurity: Not on file  Transportation Needs: Not on file  Physical Activity: Not on file  Stress: Not on file  Social Connections: Not on file     Family History: The patient's ***family history includes Cancer in her father and mother; Renal Disease in her mother.  ROS:   All other ROS reviewed and negative. Pertinent positives noted in the HPI.     EKGs/Labs/Other Studies Reviewed:   The following studies were personally reviewed by me today:  EKG:  EKG is *** ordered today.  The ekg ordered today demonstrates ***, and was personally reviewed by me.   Recent Labs: 04/10/2023: ALT 14; BUN 20; Creatinine 0.84; Hemoglobin 12.0; Platelet Count 181; Potassium 4.0; Sodium 141   Recent Lipid Panel  Component Value Date/Time   CHOL  01/02/2009 0212    143        ATP III CLASSIFICATION:  <200     mg/dL   Desirable  409-811  mg/dL   Borderline High  >=914    mg/dL   High          TRIG 782 (H) 01/02/2009 0212   HDL 45 01/02/2009 0212   CHOLHDL 3.2 01/02/2009 0212   VLDL 40 01/02/2009 0212   LDLCALC  01/02/2009 0212    58        Total Cholesterol/HDL:CHD Risk Coronary Heart Disease Risk Table                     Men   Women  1/2 Average Risk   3.4   3.3  Average Risk       5.0   4.4  2 X Average Risk   9.6   7.1  3 X Average Risk  23.4   11.0        Use the calculated Patient Ratio above and the CHD Risk Table to determine the patient's CHD Risk.        ATP III CLASSIFICATION (LDL):  <100     mg/dL   Optimal  956-213  mg/dL   Near or Above                    Optimal  130-159   mg/dL   Borderline  086-578  mg/dL   High  >469     mg/dL   Very High    Physical Exam:   VS:  There were no vitals taken for this visit.   Wt Readings from Last 3 Encounters:  04/10/23 144 lb 12.8 oz (65.7 kg)  03/21/23 144 lb 6.4 oz (65.5 kg)  03/03/23 146 lb 6.4 oz (66.4 kg)    General: Well nourished, well developed, in no acute distress Head: Atraumatic, normal size  Eyes: PEERLA, EOMI  Neck: Supple, no JVD Endocrine: No thryomegaly Cardiac: Normal S1, S2; RRR; no murmurs, rubs, or gallops Lungs: Clear to auscultation bilaterally, no wheezing, rhonchi or rales  Abd: Soft, nontender, no hepatomegaly  Ext: No edema, pulses 2+ Musculoskeletal: No deformities, BUE and BLE strength normal and equal Skin: Warm and dry, no rashes   Neuro: Alert and oriented to person, place, time, and situation, CNII-XII grossly intact, no focal deficits  Psych: Normal mood and affect   ASSESSMENT:   Amanda [name] is a 77 y.o. female who presents for the following: No diagnosis found.  PLAN:   There are no diagnoses linked to this encounter.  {Are you ordering a CV Procedure (e.g. stress test, cath, DCCV, TEE, etc)?   Press F2        :[id number]}  Disposition: No follow-ups on file.  Medication Adjustments/Labs and Tests Ordered: Current medicines are reviewed at length with the patient today.  Concerns regarding medicines are outlined above.  No orders of the defined types were placed in this encounter.  No orders of the defined types were placed in this encounter.   There are no Patient Instructions on file for this visit.   Time Spent with Patient: I have spent a total of *** minutes with patient reviewing hospital notes, telemetry, EKGs, labs and examining the patient as well as establishing an assessment and plan that was discussed with the patient.  > 50% of time was spent in direct patient care.  Signed, Lenna Gilford. Flora Lipps, MD, [facility]  [facility]  [address] [place], [place] [address] [phone]  04/20/2023 7:25 PM

## 2023-04-21 ENCOUNTER — Encounter: Payer: Self-pay | Admitting: Cardiovascular Disease

## 2023-04-21 ENCOUNTER — Ambulatory Visit: Payer: Medicare Other | Attending: Cardiovascular Disease | Admitting: Cardiovascular Disease

## 2023-04-21 VITALS — BP 124/62 | HR 60 | Ht 61.0 in | Wt 145.8 lb

## 2023-04-21 DIAGNOSIS — I471 Supraventricular tachycardia, unspecified: Secondary | ICD-10-CM | POA: Insufficient documentation

## 2023-04-21 DIAGNOSIS — I480 Paroxysmal atrial fibrillation: Secondary | ICD-10-CM | POA: Diagnosis not present

## 2023-04-21 NOTE — Patient Instructions (Signed)
Medication Instructions:  Take Metoprolol once daily for 3 days, then stop.   *If you need a refill on your cardiac medications before your next appointment, please call your pharmacy*   Follow-Up: At [facility], you and your health needs are our priority.  As part of our continuing mission to provide you with exceptional heart care, we have created designated Provider Care Teams.  These Care Teams include your primary Cardiologist (physician) and Advanced Practice Providers (APPs -  Physician Assistants and Nurse Practitioners) who all work together to provide you with the care you need, when you need it.  We recommend signing up for the patient portal called "MyChart".  Sign up information is provided on this After Visit Summary.  MyChart is used to connect with patients for Virtual Visits (Telemedicine).  Patients are able to view lab/test results, encounter notes, upcoming appointments, etc.  Non-urgent messages can be sent to your provider as well.   To learn more about what you can do with MyChart, go to [email or web address].    Your next appointment:   As needed  Provider:   Reatha Harps, MD

## 2023-04-23 ENCOUNTER — Other Ambulatory Visit: Payer: Self-pay

## 2023-04-29 ENCOUNTER — Other Ambulatory Visit (HOSPITAL_COMMUNITY): Payer: Self-pay

## 2023-05-06 NOTE — Progress Notes (Signed)
[facility] - Clinic Note  05/20/2023    CHIEF COMPLAINT Patient presents for Retina Follow Up  HISTORY OF PRESENT ILLNESS: Amanda [name] is a 77 y.o. female who presents to the clinic today for:  HPI     Retina Follow Up   Patient presents with  Wet AMD.  In both eyes.  This started 5.5 weeks ago.  I, the attending physician,  performed the HPI with the patient and updated documentation appropriately.        Comments   Patient here for 5.5 weeks retina follow up for exu ARMD OU. Patient states vision know. Distance fine. Reading vision problem needs stronger bifocals or readers. Eye sometimes gets itchy. Uses allergy drops [place]. Drops.       Last edited by Rennis Chris, MD on 05/20/2023  9:23 PM.     Pt states her vision is the same, she is having trouble reading fine print  Referring physician: Alma Downs, PA-C [facility] [address] [place],  [place] [address]  HISTORICAL INFORMATION:  Selected notes from the MEDICAL RECORD NUMBER Referred by Alma Downs, PA on 11.16.21 for eval of dry to wet AMD conversion OS.   CURRENT MEDICATIONS: No current outpatient medications on file. (Ophthalmic Drugs)   No current facility-administered medications for this visit. (Ophthalmic Drugs)   Current Outpatient Medications (Other)  Medication Sig   acalabrutinib maleate (CALQUENCE) 100 MG tablet Take 1 tablet (100 mg total) by mouth 2 (two) times daily.   acetaminophen (TYLENOL) 500 MG tablet Take 500 mg by mouth every 6 (six) hours as needed for pain.   acyclovir (ZOVIRAX) 400 MG tablet Take 1 tablet (400 mg total) by mouth daily.   amLODipine (NORVASC) 5 MG tablet Take 5 mg by mouth daily.   atorvastatin (LIPITOR) 40 MG tablet Take 1 tablet by mouth daily.   Calcium Carb-Cholecalciferol (CALCIUM + D3) 600-200 MG-UNIT TABS Take 1 tablet by mouth 3 (three) times a week.    fluticasone (FLONASE) 50 MCG/ACT nasal spray Place 2  sprays into the nose daily.   folic acid (FOLVITE) 1 MG tablet Take 1 mg by mouth daily.   hydrochlorothiazide (HYDRODIURIL) 25 MG tablet Take 25 mg by mouth daily.   Multiple Vitamin (MULTIVITAMIN) tablet Take 1 tablet by mouth daily.   Multiple Vitamins-Minerals (EYE VITAMINS & MINERALS) TABS Take by mouth.   potassium chloride SA (K-DUR,KLOR-CON) 20 MEQ tablet Take 20 mEq by mouth 3 (three) times a week. Mon, Wed, Fri   No current facility-administered medications for this visit. (Other)   REVIEW OF SYSTEMS: ROS   Positive for: Cardiovascular, Eyes Negative for: Constitutional, Gastrointestinal, Neurological, Skin, Genitourinary, Musculoskeletal, HENT, Endocrine, Respiratory, Psychiatric, Allergic/Imm, Heme/Lymph Last edited by Laddie Aquas, COA on 05/20/2023  1:07 PM.      ALLERGIES Allergies  Allergen Reactions   Codeine Nausea And Vomiting   Demerol [Meperidine] Nausea And Vomiting   Morphine And Codeine Nausea And Vomiting   Ciprofloxacin Rash   Sulfa Antibiotics Rash   PAST MEDICAL HISTORY Past Medical History:  Diagnosis Date   A-fib [facility])    Adenopathy    Atrial fibrillation (HCC)    CLL (chronic lymphocytic leukemia) (HCC) 08/31/2013   HTN (hypertension)    Hyperlipidemia    Hypertensive retinopathy    Lymphocytosis    Macular degeneration    OU   Past Surgical History:  Procedure Laterality Date   ABDOMINAL HYSTERECTOMY  endometriosis, fibroid   APPENDECTOMY     BREAST BIOPSY     benign cyst.   CATARACT EXTRACTION Bilateral 2019   CHOLECYSTECTOMY     COLONOSCOPY  11/2012   Per Dr. Matthias Hughs.  neg.    EYE SURGERY     IR FLUORO GUIDE PORT INSERTION RIGHT  08/12/2017   IR REMOVAL TUN ACCESS W/ PORT W/O FL MOD SED  01/29/2018   IR US GUIDE VASC ACCESS RIGHT  08/12/2017   SQUAMOUS CELL CARCINOMA EXCISION  12/28/2020   Excised from top of head   THUMB ARTHROSCOPY     TOTAL HIP ARTHROPLASTY  2010   FAMILY HISTORY Family History  Problem Relation  Age of Onset   Renal Disease Mother    Cancer Mother        colon cancer   Cancer Father        colon cancer   SOCIAL HISTORY Social History   Tobacco Use   Smoking status: Never   Smokeless tobacco: Never  Vaping Use   Vaping Use: Never used  Substance Use Topics   Alcohol use: No   Drug use: No       OPHTHALMIC EXAM: Base Eye Exam     Visual Acuity (Snellen - Linear)       Right Left   Dist cc 20/25 -2 20/20 -2   Dist ph cc NI     Correction: Glasses         Tonometry (Tonopen, 1:03 PM)       Right Left   Pressure 16 16         Pupils       Dark Light Shape React APD   Right 3 2 Round Brisk None   Left 3 2 Round Brisk None         Visual Fields (Counting fingers)       Left Right    Full Full         Extraocular Movement       Right Left    Full, Ortho Full, Ortho         Neuro/Psych     Oriented x3: Yes   Mood/Affect: Normal         Dilation     Both eyes: 1.0% Mydriacyl, 2.5% Phenylephrine @ 1:03 PM           Slit Lamp and Fundus Exam     Slit Lamp Exam       Right Left   Lids/Lashes Dermatochalasis - upper lid, mild Meibomian gland dysfunction Dermatochalasis - upper lid, mild Meibomian gland dysfunction   Conjunctiva/Sclera White and quiet White and quiet   Cornea Trace Debris in tear film, well healed temporal cataract wounds, 1+ pigmented Guttata, trace PEE well healed temporal cataract wounds, 2+ Punctate epithelial erosions   Anterior Chamber Deep and quiet Deep and quiet   Iris Round and dilated Round and dilated   Lens PC IOL in good position, trace PCO PC IOL in good position   Anterior Vitreous Trace Vitreous syneresis, Posterior vitreous detachment Trace Vitreous syneresis, Posterior vitreous detachment         Fundus Exam       Right Left   Disc Compact, trace pallor, Sharp rim, mild PPA Compact, Pink and Sharp, temporal PPP   C/D Ratio 0.1 0.2   Macula Blunted foveal reflex, Drusen, RPE  mottling, clumping and early atrophy, shallow PED with trace SRF Blunted foveal reflex, +CNV, Drusen, RPE mottling and  clumping; trace shallow SRF, no heme, CWS superior macula -- resolved   Vessels attenuated, Tortuous attenuated, Tortuous   Periphery Attached; mild reticular degeneration, no heme Attached, reticular degeneration, No heme           Refraction     Wearing Rx       Sphere Cylinder Axis Add   Right -0.25 +0.75 165 +2.50   Left -0.50 +0.50 012 +2.50    Type: PAL           IMAGING AND PROCEDURES  Imaging and Procedures for 05/20/2023  OCT, Retina - OU - Both Eyes       Right Eye Quality was good. Central Foveal Thickness: 273. Progression has worsened. Findings include normal foveal contour, no IRF, no SRF, retinal drusen , subretinal hyper-reflective material, intraretinal hyper-reflective material, pigment epithelial detachment, outer retinal atrophy (Interval re-development of shallow SRF and IRHM overlying PED, no IRF, stable low PED temporal macula).   Left Eye Quality was good. Central Foveal Thickness: 249. Progression has been stable. Findings include no IRF, no SRF, abnormal foveal contour, retinal drusen , subretinal hyper-reflective material, choroidal neovascular membrane, pigment epithelial detachment, outer retinal atrophy (Trace persistent SRF overlying stable PED).   Notes *Images captured and stored on drive  Diagnosis / Impression:  OD: exu ARMD -- Interval re-development of shallow SRF and IRHM overlying PED, no IRF, stable low PED temporal macula OS: exu ARMD -- Trace persistent SRF overlying stable PED  Clinical management:  See below  Abbreviations: NFP - Normal foveal profile. CME - cystoid macular edema. PED - pigment epithelial detachment. IRF - intraretinal fluid. SRF - subretinal fluid. EZ - ellipsoid zone. ERM - epiretinal membrane. ORA - outer retinal atrophy. ORT - outer retinal tubulation. SRHM - subretinal hyper-reflective  material. IRHM - intraretinal hyper-reflective material      Intravitreal Injection, Pharmacologic Agent - OS - Left Eye       Time Out 05/20/2023. 1:37 PM. Confirmed correct patient, procedure, site, and patient consented.   Anesthesia Topical anesthesia was used. Anesthetic medications included Lidocaine 2%, Proparacaine 0.5%.   Procedure Preparation included 5% betadine to ocular surface, eyelid speculum. A (32g) needle was used.   Injection: 6 mg faricimab-svoa 6 MG/0.05ML   Route: Intravitreal, Site: Left Eye   NDC: 16109-604-54, Lot: [id number], Expiration date: 03/10/2025, Waste: 0 mL   Post-op Post injection exam found visual acuity of at least counting fingers. The patient tolerated the procedure well. There were no complications. The patient received written and verbal post procedure care education.      Intravitreal Injection, Pharmacologic Agent - OD - Right Eye       Time Out 05/20/2023. 1:40 PM. Confirmed correct patient, procedure, site, and patient consented.   Anesthesia Topical anesthesia was used. Anesthetic medications included Lidocaine 2%, Proparacaine 0.5%.   Procedure Preparation included 5% betadine to ocular surface, eyelid speculum. A (32g) needle was used.   Injection: 2 mg aflibercept 2 MG/0.05ML   Route: Intravitreal, Site: Right Eye   NDC: L6038910, Lot: [id number], Expiration date: 05/10/2024, Waste: 0 mL   Post-op Post injection exam found visual acuity of at least counting fingers. The patient tolerated the procedure well. There were no complications. The patient received written and verbal post procedure care education. Post injection medications were not given.            ASSESSMENT/PLAN:    ICD-10-CM   1. Exudative age-related macular degeneration of left eye with active choroidal neovascularization (HCC)  H35.3221 OCT, Retina - OU - Both Eyes    Intravitreal Injection, Pharmacologic Agent - OS - Left Eye    faricimab-svoa  (VABYSMO) 6mg /0.73mL intravitreal injection    2. Exudative age-related macular degeneration of right eye with active choroidal neovascularization (HCC)  H35.3211 Intravitreal Injection, Pharmacologic Agent - OD - Right Eye    aflibercept (EYLEA) SOLN 2 mg    3. Essential hypertension  I10     4. Hypertensive retinopathy of both eyes  H35.033     5. Pseudophakia, both eyes  Z96.1       1. Exudative age related macular degeneration, OS  - [facility] pt with known history of nonexudative ARMD OU  - acute "haze over vision" OS -- onset, Friday 11.12.21 -- initial exam with central subretinal heme  - s/p IVA OS #1 (11.17.21), #2 (12.15.21), #3 (01.12.22), #4 (02.09.22), #5 (03.16.22), #6 (04.20.22), #7 (06.01.22), #8 (06.29.22)--IVA resistance  - s/p IVE OS #1 (07.27.22), #2 (08.24.22), #3 (09.21.22), #4 (10.19.22), #5 (11.16.22), #6 (12.16.22), #7 (01.18.23), #8 (02.15.23)--IVE resistance  - s/p IVV OS #1 SAMPLE (03.15.23), #2 (04.12.23), #3 (05.10.23), #4 (06.07.23), #5 (07.19.23), #6 (08.22.23), #7 (09.27.23), #8 (12.20.23), #9 (01.31.24), #10 (03.13.24), #11 (04.24.24), #12 (05.31.24)  - BCVA 20/20 OS -- stable  - exam and OCT show OS: Trace persistent SRF overlying stable PED at 5.5 wks  - recommend IVV OS #13 today, 07.09.24 with follow up in 5-6 weeks - pt wishes to be treated with IVV OS - RBA of procedure discussed, questions answered - IVV informed consent obtained and signed 03.15.23 (OS) - see procedure note   - f/u 5-6 weeks -- DFE/OCT, possible injection  2. Exudative age related macular degeneration OD  - conversion to exudative ARMD noted on 03.15.23 exam  - exam with new SRH temporal macula -- stably improved today  - s/p IVE OD #1 (03.15.23), #2 (04.12.23), #3 (05.10.23), #4 (06.07.23), #5 (07.19.23), #6 (08.19.23), #7 (09.27.23), #8 (11.01.23), #9 (03.11.24)  - BCVA 20/20 OD -- stable  - OCT OD: Interval re-development of shallow SRF and IRHM overlying PED, no IRF,  stable low PED temporal macula at ~4 mos since last injxn (03.11.24) - recommend IVE OD #10 today, 07.09.24 - working plan is maintenance treatment q3-4 months  - pt in agreement  - RBA of procedure discussed, questions answered - see procedure note - IVE informed consent obtained and signed, 03.15.23 (OD)  - f/u 5-6 weeks -- DFE/OCT, possible injxn  3,4. Hypertensive retinopathy OU - discussed importance of tight BP control - monitor     5. Pseudophakia OU  - s/p CE/IOL OU  - IOLs in good position, doing well  - monitor   Ophthalmic Meds Ordered this visit:  Meds ordered this encounter  Medications   faricimab-svoa (VABYSMO) 6mg /0.50mL intravitreal injection   aflibercept (EYLEA) SOLN 2 mg     Return for f/u 5-6 weeks, exu ARMD OU, DFE, OCT.  There are no Patient Instructions on file for this visit.  This document serves as a record of services personally performed by Karie Chimera, MD, PhD. It was created on their behalf by Gerilyn Nestle, COT an ophthalmic technician. The creation of this record is the provider's dictation and/or activities during the visit.    Electronically signed by:  Charlette Caffey, COT  05/20/23 9:31 PM  This document serves as a record of services personally performed by Karie Chimera, MD, PhD. It was created on their behalf by Marchelle Folks  Thomes Lolling, OA an ophthalmic technician. The creation of this record is the provider's dictation and/or activities during the visit.    Electronically signed by: Glee Arvin. Manson Passey, OA 05/20/23 9:31 PM  Karie Chimera, M.D., Ph.D. Diseases & Surgery of the Retina and Vitreous [facility]  I have reviewed the above documentation for accuracy and completeness, and I agree with the above. Karie Chimera, M.D., Ph.D. 05/20/23 9:31 PM   Abbreviations: M myopia (nearsighted); A astigmatism; H hyperopia (farsighted); P presbyopia; Mrx spectacle prescription;  CTL contact lenses; OD right  eye; OS left eye; OU both eyes  XT exotropia; ET esotropia; PEK punctate epithelial keratitis; PEE punctate epithelial erosions; DES dry eye syndrome; MGD meibomian gland dysfunction; ATs artificial tears; PFAT's preservative free artificial tears; NSC nuclear sclerotic cataract; PSC posterior subcapsular cataract; ERM epi-retinal membrane; PVD posterior vitreous detachment; RD retinal detachment; DM diabetes mellitus; DR diabetic retinopathy; NPDR non-proliferative diabetic retinopathy; PDR proliferative diabetic retinopathy; CSME clinically significant macular edema; DME diabetic macular edema; dbh dot blot hemorrhages; CWS cotton wool spot; POAG primary open angle glaucoma; C/D cup-to-disc ratio; HVF humphrey visual field; GVF goldmann visual field; OCT optical coherence tomography; IOP intraocular pressure; BRVO Branch retinal vein occlusion; CRVO central retinal vein occlusion; CRAO central retinal artery occlusion; BRAO branch retinal artery occlusion; RT retinal tear; SB scleral buckle; PPV pars plana vitrectomy; VH Vitreous hemorrhage; PRP panretinal laser photocoagulation; IVK intravitreal kenalog; VMT vitreomacular traction; MH Macular hole;  NVD neovascularization of the disc; NVE neovascularization elsewhere; AREDS age related eye disease study; ARMD age related macular degeneration; POAG primary open angle glaucoma; EBMD epithelial/anterior basement membrane dystrophy; ACIOL anterior chamber intraocular lens; IOL intraocular lens; PCIOL posterior chamber intraocular lens; Phaco/IOL phacoemulsification with intraocular lens placement; PRK photorefractive keratectomy; LASIK laser assisted in situ keratomileusis; HTN hypertension; DM diabetes mellitus; COPD chronic obstructive pulmonary disease

## 2023-05-20 ENCOUNTER — Ambulatory Visit (INDEPENDENT_AMBULATORY_CARE_PROVIDER_SITE_OTHER): Payer: Medicare Other | Admitting: Ophthalmology

## 2023-05-20 ENCOUNTER — Encounter (INDEPENDENT_AMBULATORY_CARE_PROVIDER_SITE_OTHER): Payer: Self-pay | Admitting: Ophthalmology

## 2023-05-20 DIAGNOSIS — I10 Essential (primary) hypertension: Secondary | ICD-10-CM

## 2023-05-20 DIAGNOSIS — H353221 Exudative age-related macular degeneration, left eye, with active choroidal neovascularization: Secondary | ICD-10-CM

## 2023-05-20 DIAGNOSIS — H35033 Hypertensive retinopathy, bilateral: Secondary | ICD-10-CM | POA: Diagnosis not present

## 2023-05-20 DIAGNOSIS — Z961 Presence of intraocular lens: Secondary | ICD-10-CM | POA: Diagnosis not present

## 2023-05-20 DIAGNOSIS — H353211 Exudative age-related macular degeneration, right eye, with active choroidal neovascularization: Secondary | ICD-10-CM

## 2023-05-20 DIAGNOSIS — H353231 Exudative age-related macular degeneration, bilateral, with active choroidal neovascularization: Secondary | ICD-10-CM | POA: Diagnosis not present

## 2023-05-20 MED ORDER — FARICIMAB-SVOA 6 MG/0.05ML IZ SOLN
6.00 mg | INTRAVITREAL | Status: AC | PRN
Start: 2023-05-20 — End: 2023-05-20
  Administered 2023-05-20: 6 mg via INTRAVITREAL

## 2023-05-20 MED ORDER — AFLIBERCEPT 2MG/0.05ML IZ SOLN FOR KALEIDOSCOPE
2.0000 mg | INTRAVITREAL | Status: AC | PRN
Start: 2023-05-20 — End: 2023-05-20
  Administered 2023-05-20: 2 mg via INTRAVITREAL

## 2023-05-21 ENCOUNTER — Other Ambulatory Visit (HOSPITAL_COMMUNITY): Payer: Self-pay

## 2023-05-22 ENCOUNTER — Telehealth: Payer: Self-pay

## 2023-05-22 ENCOUNTER — Inpatient Hospital Stay: Payer: Medicare Other | Attending: Hematology and Oncology

## 2023-05-22 ENCOUNTER — Encounter: Payer: Self-pay | Admitting: Hematology and Oncology

## 2023-05-22 ENCOUNTER — Other Ambulatory Visit: Payer: Self-pay

## 2023-05-22 ENCOUNTER — Inpatient Hospital Stay (HOSPITAL_BASED_OUTPATIENT_CLINIC_OR_DEPARTMENT_OTHER): Payer: Medicare Other | Admitting: Hematology and Oncology

## 2023-05-22 VITALS — BP 124/66 | HR 69 | Temp 99.0°F | Resp 18 | Ht 61.0 in | Wt 143.8 lb

## 2023-05-22 DIAGNOSIS — C9110 Chronic lymphocytic leukemia of B-cell type not having achieved remission: Secondary | ICD-10-CM | POA: Insufficient documentation

## 2023-05-22 DIAGNOSIS — K5909 Other constipation: Secondary | ICD-10-CM | POA: Diagnosis not present

## 2023-05-22 DIAGNOSIS — Z79899 Other long term (current) drug therapy: Secondary | ICD-10-CM | POA: Insufficient documentation

## 2023-05-22 LAB — COMPREHENSIVE METABOLIC PANEL
ALT: 15 U/L (ref 0–44)
AST: 16 U/L (ref 15–41)
Albumin: 4.1 g/dL (ref 3.5–5.0)
Alkaline Phosphatase: 72 U/L (ref 38–126)
Anion gap: 8 (ref 5–15)
BUN: 18 mg/dL (ref 8–23)
CO2: 30 mmol/L (ref 22–32)
Calcium: 9.9 mg/dL (ref 8.9–10.3)
Chloride: 104 mmol/L (ref 98–111)
Creatinine, Ser: 1.03 mg/dL — ABNORMAL HIGH (ref 0.44–1.00)
GFR, Estimated: 55 mL/min — ABNORMAL LOW (ref >60)
Glucose, Bld: 85 mg/dL (ref 70–99)
Potassium: 3.7 mmol/L (ref 3.5–5.1)
Sodium: 142 mmol/L (ref 135–145)
Total Bilirubin: 0.5 mg/dL (ref 0.3–1.2)
Total Protein: 7 g/dL (ref 6.5–8.1)

## 2023-05-22 LAB — CBC WITH DIFFERENTIAL/PLATELET
Abs Immature Granulocytes: 0.14 10*3/uL — ABNORMAL HIGH (ref 0.00–0.07)
Basophils Absolute: 0.1 10*3/uL (ref 0.0–0.1)
Basophils Relative: 0 %
Eosinophils Absolute: 0.4 10*3/uL (ref 0.0–0.5)
Eosinophils Relative: 1 %
HCT: 41.1 % (ref 36.0–46.0)
Hemoglobin: 13 g/dL (ref 12.0–15.0)
Immature Granulocytes: 0 %
Lymphocytes Relative: 92 %
Lymphs Abs: 79.4 10*3/uL — ABNORMAL HIGH (ref 0.7–4.0)
MCH: 28.6 pg (ref 26.0–34.0)
MCHC: 31.6 g/dL (ref 30.0–36.0)
MCV: 90.3 fL (ref 80.0–100.0)
Monocytes Absolute: 0.6 10*3/uL (ref 0.1–1.0)
Monocytes Relative: 1 %
Neutro Abs: 5 10*3/uL (ref 1.7–7.7)
Neutrophils Relative %: 6 %
Platelets: 195 10*3/uL (ref 150–400)
RBC: 4.55 MIL/uL (ref 3.87–5.11)
RDW: 14.5 % (ref 11.5–15.5)
Smear Review: NORMAL
WBC: 85.7 10*3/uL (ref 4.0–10.5)
nRBC: 0 % (ref 0.0–0.2)

## 2023-05-22 NOTE — Progress Notes (Signed)
[facility] OFFICE PROGRESS NOTE  Patient Care Team: Gweneth Dimitri, MD as PCP - General (Family Medicine) O'Neal, Ronnald Ramp, MD as PCP - Cardiology (Cardiology) Bjorn Pippin, MD as Attending Physician (Urology) Othella Boyer, MD as Attending Physician (Cardiology) Artis Delay, MD as Consulting Physician (Hematology and Oncology)  ASSESSMENT & PLAN:  CLL (chronic lymphocytic leukemia) So far, Amanda [name] tolerated treatment well without side effects  I also recommend prophylactic antiviral with acyclovir  Lymphocytosis is resolving Clinically, Amanda [name] appears to be improving Amanda [name] has no palpable lymphadenopathy on exam I plan to space out her visits to every 3 months I will order repeat imaging study after Amanda [name] has resolution of lymphocytosis  Other constipation Amanda [name] has chronic constipation I recommend gentle laxatives as tolerated  No orders of the defined types were placed in this encounter.   All questions were answered. The patient knows to call the clinic with any problems, questions or concerns. The total time spent in the appointment was 20 minutes encounter with patients including review of chart and various tests results, discussions about plan of care and coordination of care plan   Artis Delay, MD 05/22/2023 11:30 AM  INTERVAL HISTORY: Please see below for problem oriented charting. Amanda [name] returns for treatment follow-up Amanda [name] tolerated Calquence well Amanda [name] has mild intermittent constipation Denies recent infection  REVIEW OF SYSTEMS:   Constitutional: Denies fevers, chills or abnormal weight loss Eyes: Denies blurriness of vision Ears, nose, mouth, throat, and face: Denies mucositis or sore throat Respiratory: Denies cough, dyspnea or wheezes Cardiovascular: Denies palpitation, chest discomfort or lower extremity swelling Skin: Denies abnormal skin rashes Lymphatics: Denies new lymphadenopathy or easy bruising Neurological:Denies numbness, tingling or new  weaknesses Behavioral/Psych: Mood is stable, no new changes  All other systems were reviewed with the patient and are negative.  I have reviewed the past medical history, past surgical history, social history and family history with the patient and they are unchanged from previous note.  ALLERGIES:  is allergic to codeine, demerol [meperidine], morphine and codeine, ciprofloxacin, and sulfa antibiotics.  MEDICATIONS:  Current Outpatient Medications  Medication Sig Dispense Refill   acalabrutinib maleate (CALQUENCE) 100 MG tablet Take 1 tablet (100 mg total) by mouth 2 (two) times daily. 60 tablet 11   acetaminophen (TYLENOL) 500 MG tablet Take 500 mg by mouth every 6 (six) hours as needed for pain.     acyclovir (ZOVIRAX) 400 MG tablet Take 1 tablet (400 mg total) by mouth daily. 30 tablet 6   amLODipine (NORVASC) 5 MG tablet Take 5 mg by mouth daily.     atorvastatin (LIPITOR) 40 MG tablet Take 1 tablet by mouth daily.     Calcium Carb-Cholecalciferol (CALCIUM + D3) 600-200 MG-UNIT TABS Take 1 tablet by mouth 3 (three) times a week.      fluticasone (FLONASE) 50 MCG/ACT nasal spray Place 2 sprays into the nose daily.     folic acid (FOLVITE) 1 MG tablet Take 1 mg by mouth daily.     hydrochlorothiazide (HYDRODIURIL) 25 MG tablet Take 25 mg by mouth daily.     Multiple Vitamin (MULTIVITAMIN) tablet Take 1 tablet by mouth daily.     Multiple Vitamins-Minerals (EYE VITAMINS & MINERALS) TABS Take by mouth.     potassium chloride SA (K-DUR,KLOR-CON) 20 MEQ tablet Take 20 mEq by mouth 3 (three) times a week. Mon, Wed, Fri     No current facility-administered medications for this visit.    SUMMARY OF ONCOLOGIC HISTORY: Oncology History  Overview Note  Del 13q   CLL (chronic lymphocytic leukemia) (HCC)  02/09/2013 Pathology Results   Peripheral Blood Flow Cytometry - FINDINGS CONSISTENT WITH CHRONIC LYMPHOCYTIC LEUKEMIA   09/02/2013 Pathology Results   FISH positive for deletion 13 q    05/22/2017 Imaging   Worsening mesenteric and retroperitoneal adenopathy concerning for worsening lymphoproliferative disorder/lymphoma.   Small cysts in the liver and kidneys are stable.   Prior cholecystectomy.   Aortic atherosclerosis.   Left colonic diverticulosis without active diverticulitis.   08/11/2017 Imaging   Chest Impression:  1. Mild mediastinal and hilar lymphadenopathy. 2. Clusters small axial lymph nodes.  Abdomen / Pelvis Impression:  1. Mild periaortic retroperitoneal adenopathy and moderate central mesenteric adenopathy not changed comparison CT. 2. Mild iliac adenopathy 3. Normal volume spleen. 4. No explanation for RIGHT lower quadrant pain. Cluster of lymph nodes in the ileocecal mesenteries similar to prior.   08/12/2017 Procedure   Placement of single lumen port a cath via right internal jugular vein. The catheter tip lies at the cavo-atrial junction. A power injectable port a cath was placed and is ready for immediate use.   08/14/2017 - 11/14/2017 Chemotherapy   Amanda [name] received Bendamustine and Rituxan   11/12/2017 Imaging   1. Continued decrease in mediastinal and hilar lymph nodes with interval resolution of the abdominal and pelvic lymphadenopathy seen on the prior study. There is no lymphadenopathy in the chest, abdomen, or pelvis by CT size criteria on today's study. No new or progressive interval findings. 2.  Aortic Atherosclerois (ICD10-170.0) 3. Hepatic and renal cysts, stable.   12/18/2017 Adverse Reaction   We are unable to resume cycle 5 of chemotherapy due to prolonged pancytopenia   01/29/2018 Procedure   Successful right IJ vein Port-A-Cath explant.   02/25/2023 Imaging   CT CHEST ABDOMEN PELVIS W CONTRAST  Result Date: 02/25/2023 CLINICAL DATA:  77 year old female with history of CLL originally diagnosed in 2019. Increasing white blood cell count and lymphadenopathy. * Tracking Code: BO * EXAM: CT CHEST, ABDOMEN, AND PELVIS WITH CONTRAST  TECHNIQUE: Multidetector CT imaging of the chest, abdomen and pelvis was performed following the standard protocol during bolus administration of intravenous contrast. RADIATION DOSE REDUCTION: This exam was performed according to the departmental dose-optimization program which includes automated exposure control, adjustment of the mA and/or kV according to patient size and/or use of iterative reconstruction technique. CONTRAST:  OMNIPAQUE IOHEXOL 300 MG/ML  SOLN COMPARISON:  CT of the chest, abdomen and pelvis 11/12/2017. FINDINGS: CT CHEST FINDINGS Cardiovascular: Heart size is normal. There is no significant pericardial fluid, thickening or pericardial calcification. Aortic atherosclerosis. No definite coronary artery calcifications. Mediastinum/Nodes: Multiple enlarged and borderline enlarged mediastinal and bilateral hilar lymph nodes are noted measuring up to 1.6 cm in short axis in the low right paratracheal nodal station and 1.4 cm in short axis in the right hilar region. Multiple enlarged and borderline enlarged bilateral axillary and subpectoral lymph nodes are also noted measuring up to 1 cm in the left axillary region (axial image 12 of series 2). Esophagus is unremarkable in appearance. Lungs/Pleura: 3 mm right lower lobe pulmonary nodule (axial image 110 of series 4). No other larger more suspicious appearing pulmonary nodules or masses are noted. No acute consolidative airspace disease. No pleural effusions. Musculoskeletal: There are no aggressive appearing lytic or blastic lesions noted in the visualized portions of the skeleton. CT ABDOMEN PELVIS FINDINGS Hepatobiliary: Multiple small well-defined low-attenuation lesions scattered throughout the hepatic parenchyma. The largest of these are  compatible with cysts, measuring up to 1.4 cm in diameter. The smaller lesions are too small to definitively characterize, but also favored represent small cysts and/or biliary hamartomas. Less  well-defined areas of low attenuation are also noted, particularly adjacent to the gallbladder fossa, where the largest of these measures 2.0 x 1.5 cm (axial image 61 of series 2), slightly more pronounced than the prior examination from 2019, incompletely characterize, but favored to represent focal fatty infiltration. Status post cholecystectomy. No intrahepatic biliary ductal dilatation. Common bile duct measures 1.3 cm in the porta hepatis, likely reflective of benign post cholecystectomy physiology. Pancreas: No pancreatic mass. No pancreatic ductal dilatation. No pancreatic or peripancreatic fluid collections or inflammatory changes. Spleen: Spleen is enlarged measuring 12.4 x 6.2 x 10.4 cm (estimated splenic volume of 400 mL) . Adrenals/Urinary Tract: Subcentimeter low-attenuation lesions in both kidneys, too small to definitively characterize, but statistically likely to represent tiny cysts (no imaging follow-up recommended). No hydroureteronephrosis. Urinary bladder is normal in appearance. Bilateral adrenal glands are normal in appearance. Stomach/Bowel: Normal appearance of the stomach. No pathologic dilatation of small bowel or colon. Numerous colonic diverticula are noted, without surrounding inflammatory changes to suggest an acute diverticulitis at this time. Vascular/Lymphatic: Atherosclerosis in the abdominal aorta and pelvic vasculature, without evidence of aneurysm or dissection. Extensive lymphadenopathy is noted in the abdomen and pelvis, predominantly in the small bowel mesentery where the largest lymph nodes measure up to 2.2 cm in short axis (axial image 76 of series 2). Multiple prominent borderline enlarged and mildly enlarged retroperitoneal lymph nodes are also noted measuring up to 1.4 cm in short axis in the low left para-aortic nodal station (axial image 75 of series 2). The largest pelvic lymph nodes are in the left obturator nodal station (axial image 96 of series 2) measuring 1.3  cm in short axis. Reproductive: Status post hysterectomy. Ovaries are not confidently identified may be surgically absent or atrophic. Other: No significant volume of ascites.  No pneumoperitoneum. Musculoskeletal: Status post right hip hip arthroplasty. There are no aggressive appearing lytic or blastic lesions noted in the visualized portions of the skeleton. IMPRESSION: 1. Extensive lymphadenopathy throughout the chest, abdomen and pelvis, as above, compatible with recurrent disease. 2. Mild splenomegaly. 3. Poorly defined low-attenuation regions in the liver, most evident in segment 5 adjacent to the gallbladder fossa, favored to represent areas of focal fatty infiltration. Attention on follow-up imaging is recommended to ensure stability. Alternatively, definitive characterization could be obtained with MRI of the abdomen with and without IV gadolinium if of clinical concern. 4. 3 mm right lower lobe pulmonary nodule, nonspecific, but statistically likely benign. Attention at time of routine imaging follow-up is recommended to ensure stability or regression. 5. Aortic atherosclerosis. 6. Additional incidental imaging findings, as above. Electronically Signed   By: Trudie Reed M.D.   On: 02/25/2023 08:44   MM 3D SCREENING MAMMOGRAM BILATERAL BREAST  Result Date: 02/18/2023 CLINICAL DATA:  Screening. EXAM: DIGITAL SCREENING BILATERAL MAMMOGRAM WITH TOMOSYNTHESIS AND CAD TECHNIQUE: Bilateral screening digital craniocaudal and mediolateral oblique mammograms were obtained. Bilateral screening digital breast tomosynthesis was performed. The images were evaluated with computer-aided detection. COMPARISON:  Previous exam(s). ACR Breast Density Category b: There are scattered areas of fibroglandular density. FINDINGS: There are no findings suspicious for malignancy. IMPRESSION: No mammographic evidence of malignancy. A result letter of this screening mammogram will be mailed directly to the patient.  RECOMMENDATION: Screening mammogram in one year. (Code:SM-B-01Y) BI-RADS CATEGORY  1: Negative. Electronically Signed  By: Sherian Rein M.D.   On: 02/18/2023 11:26      03/21/2023 -  Chemotherapy   Amanda [name] started on Calquence     PHYSICAL EXAMINATION: ECOG PERFORMANCE STATUS: 0 - Asymptomatic  Vitals:   05/22/23 1112  BP: 124/66  Pulse: 69  Resp: 18  Temp: 99 F (37.2 C)  SpO2: 97%   Filed Weights   05/22/23 1112  Weight: 143 lb 12.8 oz (65.2 kg)    GENERAL:alert, no distress and comfortable SKIN: skin color, texture, turgor are normal, no rashes or significant lesions EYES: normal, Conjunctiva are pink and non-injected, sclera clear OROPHARYNX:no exudate, no erythema and lips, buccal mucosa, and tongue normal  NECK: supple, thyroid normal size, non-tender, without nodularity LYMPH:  no palpable lymphadenopathy in the cervical, axillary or inguinal LUNGS: clear to auscultation and percussion with normal breathing effort HEART: regular rate & rhythm and no murmurs and no lower extremity edema ABDOMEN:abdomen soft, non-tender and normal bowel sounds Musculoskeletal:no cyanosis of digits and no clubbing  NEURO: alert & oriented x 3 with fluent speech, no focal motor/sensory deficits  LABORATORY DATA:  I have reviewed the data as listed    Component Value Date/Time   NA 141 04/10/2023 1128   NA 141 11/13/2017 0753   K 4.0 04/10/2023 1128   K 4.1 11/13/2017 0753   CL 106 04/10/2023 1128   CL 104 02/08/2013 1454   CO2 30 04/10/2023 1128   CO2 27 11/13/2017 0753   GLUCOSE 100 (H) 04/10/2023 1128   GLUCOSE 113 11/13/2017 0753   GLUCOSE 103 (H) 02/08/2013 1454   BUN 20 04/10/2023 1128   BUN 11.6 11/13/2017 0753   CREATININE 0.84 04/10/2023 1128   CREATININE 0.8 11/13/2017 0753   CALCIUM 9.7 04/10/2023 1128   CALCIUM 9.3 11/13/2017 0753   PROT 6.9 04/10/2023 1128   PROT 6.2 (L) 11/13/2017 0753   ALBUMIN 4.4 04/10/2023 1128   ALBUMIN 3.3 (L) 11/13/2017 0753   AST 16  04/10/2023 1128   AST 24 11/13/2017 0753   ALT 14 04/10/2023 1128   ALT 24 11/13/2017 0753   ALKPHOS 74 04/10/2023 1128   ALKPHOS 83 11/13/2017 0753   BILITOT 0.7 04/10/2023 1128   BILITOT 0.38 11/13/2017 0753   GFRNONAA >60 04/10/2023 1128   GFRAA >60 01/12/2019 0938    No results found for: "SPEP", "UPEP"  Lab Results  Component Value Date   WBC 85.7 (HH) 05/22/2023   NEUTROABS PENDING 05/22/2023   HGB 13.0 05/22/2023   HCT 41.1 05/22/2023   MCV 90.3 05/22/2023   PLT 195 05/22/2023      Chemistry      Component Value Date/Time   NA 141 04/10/2023 1128   NA 141 11/13/2017 0753   K 4.0 04/10/2023 1128   K 4.1 11/13/2017 0753   CL 106 04/10/2023 1128   CL 104 02/08/2013 1454   CO2 30 04/10/2023 1128   CO2 27 11/13/2017 0753   BUN 20 04/10/2023 1128   BUN 11.6 11/13/2017 0753   CREATININE 0.84 04/10/2023 1128   CREATININE 0.8 11/13/2017 0753      Component Value Date/Time   CALCIUM 9.7 04/10/2023 1128   CALCIUM 9.3 11/13/2017 0753   ALKPHOS 74 04/10/2023 1128   ALKPHOS 83 11/13/2017 0753   AST 16 04/10/2023 1128   AST 24 11/13/2017 0753   ALT 14 04/10/2023 1128   ALT 24 11/13/2017 0753   BILITOT 0.7 04/10/2023 1128   BILITOT 0.38 11/13/2017 0753

## 2023-05-22 NOTE — Assessment & Plan Note (Signed)
She has chronic constipation I recommend gentle laxatives as tolerated

## 2023-05-22 NOTE — Telephone Encounter (Signed)
Got it.

## 2023-05-22 NOTE — Telephone Encounter (Signed)
CRITICAL VALUE STICKER  CRITICAL VALUE:  WBC 85.7  RECEIVER (on-site recipient of call):Jheri Mitter, LPN  DATE & TIME NOTIFIED: 05/22/23  11:18  MESSENGER (representative from lab):Melvia Heaps  MD NOTIFIED: Artis Delay, MD  TIME OF NOTIFICATION: 11:19  RESPONSE:

## 2023-05-22 NOTE — Assessment & Plan Note (Signed)
So far, she tolerated treatment well without side effects  I also recommend prophylactic antiviral with acyclovir  Lymphocytosis is resolving Clinically, she appears to be improving She has no palpable lymphadenopathy on exam I plan to space out her visits to every 3 months I will order repeat imaging study after she has resolution of lymphocytosis

## 2023-05-26 ENCOUNTER — Other Ambulatory Visit (HOSPITAL_COMMUNITY): Payer: Self-pay

## 2023-06-12 ENCOUNTER — Other Ambulatory Visit (HOSPITAL_COMMUNITY): Payer: Self-pay

## 2023-06-24 ENCOUNTER — Other Ambulatory Visit: Payer: Self-pay

## 2023-06-24 ENCOUNTER — Other Ambulatory Visit (HOSPITAL_COMMUNITY): Payer: Self-pay

## 2023-06-25 ENCOUNTER — Telehealth: Payer: Self-pay

## 2023-06-25 ENCOUNTER — Encounter (INDEPENDENT_AMBULATORY_CARE_PROVIDER_SITE_OTHER): Payer: Medicare Other | Admitting: Ophthalmology

## 2023-06-25 NOTE — Telephone Encounter (Signed)
Returned Pt's call regarding form needed for trip reimbursement. Pt states she needs form completed by MD for reimbursement. Asked Pt to bring to office for evaluation, Pt states she will drop form off with front desk on 06/26/23.

## 2023-06-30 NOTE — Progress Notes (Signed)
[facility] - Clinic Note  07/02/2023    CHIEF COMPLAINT Patient presents for Retina Follow Up  HISTORY OF PRESENT ILLNESS: Amanda [name] is a 77 y.o. female who presents to the clinic today for:  HPI     Retina Follow Up   Patient presents with  Wet AMD.  In both eyes.  This started 6 weeks ago.  Duration of 6 weeks.  Since onset it is stable.  I, the attending physician,  performed the HPI with the patient and updated documentation appropriately.        Comments   6 week retina follow up ARMD OU and IVV OS pt is reporting no vision changes noticed she has floaters but denies any flashes of light       Last edited by Rennis Chris, MD on 07/02/2023  5:01 PM.    Pt states vision seems stable   Referring physician: Alma Downs, PA-C [facility] [address] [place],  [place] [address]  HISTORICAL INFORMATION:  Selected notes from the MEDICAL RECORD NUMBER Referred by Alma Downs, PA on 11.16.21 for eval of dry to wet AMD conversion OS.   CURRENT MEDICATIONS: No current outpatient medications on file. (Ophthalmic Drugs)   No current facility-administered medications for this visit. (Ophthalmic Drugs)   Current Outpatient Medications (Other)  Medication Sig   acalabrutinib maleate (CALQUENCE) 100 MG tablet Take 1 tablet (100 mg total) by mouth 2 (two) times daily.   acetaminophen (TYLENOL) 500 MG tablet Take 500 mg by mouth every 6 (six) hours as needed for pain.   acyclovir (ZOVIRAX) 400 MG tablet Take 1 tablet (400 mg total) by mouth daily.   amLODipine (NORVASC) 5 MG tablet Take 5 mg by mouth daily.   atorvastatin (LIPITOR) 40 MG tablet Take 1 tablet by mouth daily.   Calcium Carb-Cholecalciferol (CALCIUM + D3) 600-200 MG-UNIT TABS Take 1 tablet by mouth 3 (three) times a week.    fluticasone (FLONASE) 50 MCG/ACT nasal spray Place 2 sprays into the nose daily.   folic acid (FOLVITE) 1 MG tablet Take 1 mg by mouth  daily.   hydrochlorothiazide (HYDRODIURIL) 25 MG tablet Take 25 mg by mouth daily.   Multiple Vitamin (MULTIVITAMIN) tablet Take 1 tablet by mouth daily.   Multiple Vitamins-Minerals (EYE VITAMINS & MINERALS) TABS Take by mouth.   potassium chloride SA (K-DUR,KLOR-CON) 20 MEQ tablet Take 20 mEq by mouth 3 (three) times a week. Mon, Wed, Fri   No current facility-administered medications for this visit. (Other)   REVIEW OF SYSTEMS: ROS   Positive for: Cardiovascular, Eyes Negative for: Constitutional, Gastrointestinal, Neurological, Skin, Genitourinary, Musculoskeletal, HENT, Endocrine, Respiratory, Psychiatric, Allergic/Imm, Heme/Lymph Last edited by Etheleen Mayhew, COT on 07/02/2023  1:35 PM.       ALLERGIES Allergies  Allergen Reactions   Codeine Nausea And Vomiting   Demerol [Meperidine] Nausea And Vomiting   Morphine And Codeine Nausea And Vomiting   Ciprofloxacin Rash   Sulfa Antibiotics Rash   PAST MEDICAL HISTORY Past Medical History:  Diagnosis Date   A-fib [facility])    Adenopathy    Atrial fibrillation (HCC)    CLL (chronic lymphocytic leukemia) (HCC) 08/31/2013   HTN (hypertension)    Hyperlipidemia    Hypertensive retinopathy    Lymphocytosis    Macular degeneration    OU   Past Surgical History:  Procedure Laterality Date   ABDOMINAL HYSTERECTOMY     endometriosis, fibroid   APPENDECTOMY  BREAST BIOPSY     benign cyst.   CATARACT EXTRACTION Bilateral 2019   CHOLECYSTECTOMY     COLONOSCOPY  11/2012   Per Dr. Matthias Hughs.  neg.    EYE SURGERY     IR FLUORO GUIDE PORT INSERTION RIGHT  08/12/2017   IR REMOVAL TUN ACCESS W/ PORT W/O FL MOD SED  01/29/2018   IR US GUIDE VASC ACCESS RIGHT  08/12/2017   SQUAMOUS CELL CARCINOMA EXCISION  12/28/2020   Excised from top of head   THUMB ARTHROSCOPY     TOTAL HIP ARTHROPLASTY  2010   FAMILY HISTORY Family History  Problem Relation Age of Onset   Renal Disease Mother    Cancer Mother        colon cancer    Cancer Father        colon cancer   SOCIAL HISTORY Social History   Tobacco Use   Smoking status: Never   Smokeless tobacco: Never  Vaping Use   Vaping status: Never Used  Substance Use Topics   Alcohol use: No   Drug use: No       OPHTHALMIC EXAM: Base Eye Exam     Visual Acuity (Snellen - Linear)       Right Left   Dist cc 20/25 20/25   Dist ph cc NI NI         Tonometry (Tonopen, 1:41 PM)       Right Left   Pressure 16 18         Pupils       Pupils Dark Light Shape React APD   Right PERRL 3 2 Round Brisk None   Left PERRL 3 2 Round Brisk None         Visual Fields       Left Right    Full Full         Extraocular Movement       Right Left    Full, Ortho Full, Ortho         Neuro/Psych     Oriented x3: Yes   Mood/Affect: Normal         Dilation     Both eyes: 2.5% Phenylephrine @ 1:39 PM           Slit Lamp and Fundus Exam     Slit Lamp Exam       Right Left   Lids/Lashes Dermatochalasis - upper lid, mild Meibomian gland dysfunction Dermatochalasis - upper lid, mild Meibomian gland dysfunction   Conjunctiva/Sclera White and quiet White and quiet   Cornea Trace Debris in tear film, well healed temporal cataract wounds, 1+ pigmented Guttata, trace PEE well healed temporal cataract wounds, 2+ Punctate epithelial erosions   Anterior Chamber Deep and quiet Deep and quiet   Iris Round and dilated Round and dilated   Lens PC IOL in good position, trace PCO PC IOL in good position   Anterior Vitreous Trace Vitreous syneresis, Posterior vitreous detachment Trace Vitreous syneresis, Posterior vitreous detachment         Fundus Exam       Right Left   Disc Compact, trace pallor, Sharp rim, mild PPA Compact, Pink and Sharp, temporal PPP   C/D Ratio 0.1 0.2   Macula Blunted foveal reflex, Drusen, RPE mottling, clumping and early atrophy, shallow PED with trace SRF Blunted foveal reflex, +CNV, Drusen, RPE mottling and  clumping; trace shallow SRF, no heme   Vessels attenuated, Tortuous attenuated, Tortuous   Periphery Attached;  mild reticular degeneration, no heme Attached, reticular degeneration, No heme           Refraction     Wearing Rx       Sphere Cylinder Axis Add   Right -0.25 +0.75 165 +2.50   Left -0.50 +0.50 012 +2.50    Type: PAL           IMAGING AND PROCEDURES  Imaging and Procedures for 07/02/2023  OCT, Retina - OU - Both Eyes       Right Eye Quality was good. Central Foveal Thickness: 271. Progression has improved. Findings include normal foveal contour, no IRF, no SRF, retinal drusen , subretinal hyper-reflective material, intraretinal hyper-reflective material, pigment epithelial detachment, outer retinal atrophy (Mild interval improvement in shallow SRF and IRHM overlying PED, no IRF, stable low PED temporal macula).   Left Eye Quality was good. Central Foveal Thickness: 249. Progression has improved. Findings include no IRF, no SRF, abnormal foveal contour, retinal drusen , subretinal hyper-reflective material, choroidal neovascular membrane, pigment epithelial detachment, outer retinal atrophy (Mild interval improvement in SRF overlying stable PED).   Notes *Images captured and stored on drive  Diagnosis / Impression:  OD: exu ARMD -- Mild interval improvement in shallow SRF and IRHM overlying PED, no IRF, stable low PED temporal macula OS: exu ARMD -- Mild interval improvement in SRF overlying stable PED  Clinical management:  See below  Abbreviations: NFP - Normal foveal profile. CME - cystoid macular edema. PED - pigment epithelial detachment. IRF - intraretinal fluid. SRF - subretinal fluid. EZ - ellipsoid zone. ERM - epiretinal membrane. ORA - outer retinal atrophy. ORT - outer retinal tubulation. SRHM - subretinal hyper-reflective material. IRHM - intraretinal hyper-reflective material      Intravitreal Injection, Pharmacologic Agent - OS - Left Eye        Time Out 07/02/2023. 2:22 PM. Confirmed correct patient, procedure, site, and patient consented.   Anesthesia Topical anesthesia was used. Anesthetic medications included Lidocaine 2%, Proparacaine 0.5%.   Procedure Preparation included 5% betadine to ocular surface, eyelid speculum. A (32g) needle was used.   Injection: 6 mg faricimab-svoa 6 MG/0.05ML   Route: Intravitreal, Site: Left Eye   NDC: O8010301, Lot: [id number], Expiration date: 07/11/2025, Waste: 0 mL   Post-op Post injection exam found visual acuity of at least counting fingers. The patient tolerated the procedure well. There were no complications. The patient received written and verbal post procedure care education.      Intravitreal Injection, Pharmacologic Agent - OD - Right Eye       Time Out 07/02/2023. 2:30 PM. Confirmed correct patient, procedure, site, and patient consented.   Anesthesia Topical anesthesia was used. Anesthetic medications included Lidocaine 2%, Proparacaine 0.5%.   Procedure Preparation included 5% betadine to ocular surface, eyelid speculum. A (32g) needle was used.   Injection: 2 mg aflibercept 2 MG/0.05ML   Route: Intravitreal, Site: Right Eye   NDC: L6038910, Lot: [id number], Expiration date: 10/10/2024, Waste: 0 mL   Post-op Post injection exam found visual acuity of at least counting fingers. The patient tolerated the procedure well. There were no complications. The patient received written and verbal post procedure care education. Post injection medications were not given.            ASSESSMENT/PLAN:    ICD-10-CM   1. Exudative age-related macular degeneration of left eye with active choroidal neovascularization (HCC)  H35.3221 OCT, Retina - OU - Both Eyes    Intravitreal Injection, Pharmacologic Agent - OS -  Left Eye    faricimab-svoa (VABYSMO) 6mg /0.41mL intravitreal injection    2. Exudative age-related macular degeneration of right eye with active choroidal  neovascularization (HCC)  H35.3211 Intravitreal Injection, Pharmacologic Agent - OD - Right Eye    aflibercept (EYLEA) SOLN 2 mg    CANCELED: Intravitreal Injection, Pharmacologic Agent - OS - Left Eye    3. Essential hypertension  I10     4. Hypertensive retinopathy of both eyes  H35.033     5. Pseudophakia, both eyes  Z96.1        1. Exudative age related macular degeneration, OS  - [facility] pt with known history of nonexudative ARMD OU  - acute "haze over vision" OS -- onset, Friday 11.12.21 -- initial exam with central subretinal heme  - s/p IVA OS #1 (11.17.21), #2 (12.15.21), #3 (01.12.22), #4 (02.09.22), #5 (03.16.22), #6 (04.20.22), #7 (06.01.22), #8 (06.29.22)--IVA resistance  - s/p IVE OS #1 (07.27.22), #2 (08.24.22), #3 (09.21.22), #4 (10.19.22), #5 (11.16.22), #6 (12.16.22), #7 (01.18.23), #8 (02.15.23)--IVE resistance  - s/p IVV OS #1 SAMPLE (03.15.23), #2 (04.12.23), #3 (05.10.23), #4 (06.07.23), #5 (07.19.23), #6 (08.22.23), #7 (09.27.23), #8 (12.20.23), #9 (01.31.24), #10 (03.13.24), #11 (04.24.24), #12 (05.31.24), #13 (07.09.24)  - BCVA 20/25 OS -- decreased from 20/20  - exam and OCT show OS: Mild interval improvement in SRF overlying stable PED at 6 wks  - recommend IVV OS #14 today, 08.21.24 with follow up in 6 weeks - pt wishes to be treated with IVV OS - RBA of procedure discussed, questions answered - IVV informed consent obtained and signed 03.15.23 (OS) - see procedure note   - f/u 6 weeks -- DFE/OCT, possible injection  2. Exudative age related macular degeneration OD  - conversion to exudative ARMD noted on 03.15.23 exam  - exam with new SRH temporal macula -- stably improved today  - s/p IVE OD #1 (03.15.23), #2 (04.12.23), #3 (05.10.23), #4 (06.07.23), #5 (07.19.23), #6 (08.19.23), #7 (09.27.23), #8 (11.01.23), #9 (03.11.24), #10 (07.09.24)  - BCVA 20/25 OD -- decreased from 20/20  - OCT OD: Mild interval improvement in shallow SRF and IRHM  overlying PED, no IRF, stable low PED temporal macula at 6 weeks - recommend IVE OD #11 today, 08.21.24 w/ f/u in 6 wks  - pt in agreement  - RBA of procedure discussed, questions answered - see procedure note - IVE informed consent obtained and signed, 03.15.23 (OD)  - f/u 6 weeks -- DFE/OCT, possible injxn  3,4. Hypertensive retinopathy OU - discussed importance of tight BP control - monitor     5. Pseudophakia OU  - s/p CE/IOL OU  - IOLs in good position, doing well  - monitor   Ophthalmic Meds Ordered this visit:  Meds ordered this encounter  Medications   faricimab-svoa (VABYSMO) 6mg /0.74mL intravitreal injection   aflibercept (EYLEA) SOLN 2 mg     Return in about 6 weeks (around 08/13/2023) for f/u exu ARMD OU, DFE, OCT.  There are no Patient Instructions on file for this visit.  This document serves as a record of services personally performed by Karie Chimera, MD, PhD. It was created on their behalf by De Blanch, an ophthalmic technician. The creation of this record is the provider's dictation and/or activities during the visit.    Electronically signed by: De Blanch, OA, 07/03/23  3:21 PM  This document serves as a record of services personally performed by Karie Chimera, MD, PhD. It was created on their behalf by  Glee Arvin. Manson Passey, OA an ophthalmic technician. The creation of this record is the provider's dictation and/or activities during the visit.    Electronically signed by: Glee Arvin. Manson Passey, OA 07/03/23 3:21 PM  Karie Chimera, M.D., Ph.D. Diseases & Surgery of the Retina and Vitreous [facility]  I have reviewed the above documentation for accuracy and completeness, and I agree with the above. Karie Chimera, M.D., Ph.D. 07/03/23 3:23 PM   Abbreviations: M myopia (nearsighted); A astigmatism; H hyperopia (farsighted); P presbyopia; Mrx spectacle prescription;  CTL contact lenses; OD right eye; OS left eye; OU both eyes   XT exotropia; ET esotropia; PEK punctate epithelial keratitis; PEE punctate epithelial erosions; DES dry eye syndrome; MGD meibomian gland dysfunction; ATs artificial tears; PFAT's preservative free artificial tears; NSC nuclear sclerotic cataract; PSC posterior subcapsular cataract; ERM epi-retinal membrane; PVD posterior vitreous detachment; RD retinal detachment; DM diabetes mellitus; DR diabetic retinopathy; NPDR non-proliferative diabetic retinopathy; PDR proliferative diabetic retinopathy; CSME clinically significant macular edema; DME diabetic macular edema; dbh dot blot hemorrhages; CWS cotton wool spot; POAG primary open angle glaucoma; C/D cup-to-disc ratio; HVF humphrey visual field; GVF goldmann visual field; OCT optical coherence tomography; IOP intraocular pressure; BRVO Branch retinal vein occlusion; CRVO central retinal vein occlusion; CRAO central retinal artery occlusion; BRAO branch retinal artery occlusion; RT retinal tear; SB scleral buckle; PPV pars plana vitrectomy; VH Vitreous hemorrhage; PRP panretinal laser photocoagulation; IVK intravitreal kenalog; VMT vitreomacular traction; MH Macular hole;  NVD neovascularization of the disc; NVE neovascularization elsewhere; AREDS age related eye disease study; ARMD age related macular degeneration; POAG primary open angle glaucoma; EBMD epithelial/anterior basement membrane dystrophy; ACIOL anterior chamber intraocular lens; IOL intraocular lens; PCIOL posterior chamber intraocular lens; Phaco/IOL phacoemulsification with intraocular lens placement; PRK photorefractive keratectomy; LASIK laser assisted in situ keratomileusis; HTN hypertension; DM diabetes mellitus; COPD chronic obstructive pulmonary disease

## 2023-07-01 ENCOUNTER — Other Ambulatory Visit (HOSPITAL_COMMUNITY): Payer: Self-pay

## 2023-07-02 ENCOUNTER — Ambulatory Visit (INDEPENDENT_AMBULATORY_CARE_PROVIDER_SITE_OTHER): Payer: Medicare Other | Admitting: Ophthalmology

## 2023-07-02 ENCOUNTER — Encounter (INDEPENDENT_AMBULATORY_CARE_PROVIDER_SITE_OTHER): Payer: Self-pay | Admitting: Ophthalmology

## 2023-07-02 DIAGNOSIS — H35033 Hypertensive retinopathy, bilateral: Secondary | ICD-10-CM

## 2023-07-02 DIAGNOSIS — H353231 Exudative age-related macular degeneration, bilateral, with active choroidal neovascularization: Secondary | ICD-10-CM | POA: Diagnosis not present

## 2023-07-02 DIAGNOSIS — H353211 Exudative age-related macular degeneration, right eye, with active choroidal neovascularization: Secondary | ICD-10-CM

## 2023-07-02 DIAGNOSIS — I10 Essential (primary) hypertension: Secondary | ICD-10-CM | POA: Diagnosis not present

## 2023-07-02 DIAGNOSIS — Z961 Presence of intraocular lens: Secondary | ICD-10-CM

## 2023-07-02 DIAGNOSIS — H353221 Exudative age-related macular degeneration, left eye, with active choroidal neovascularization: Secondary | ICD-10-CM

## 2023-07-02 MED ORDER — FARICIMAB-SVOA 6 MG/0.05ML IZ SOLN
6.0000 mg | INTRAVITREAL | Status: AC | PRN
Start: 2023-07-02 — End: 2023-07-02
  Administered 2023-07-02: 6 mg via INTRAVITREAL

## 2023-07-02 MED ORDER — AFLIBERCEPT 2MG/0.05ML IZ SOLN FOR KALEIDOSCOPE
2.0000 mg | INTRAVITREAL | Status: AC | PRN
Start: 2023-07-02 — End: 2023-07-02
  Administered 2023-07-02: 2 mg via INTRAVITREAL

## 2023-07-11 ENCOUNTER — Encounter: Payer: Self-pay | Admitting: *Deleted

## 2023-07-21 ENCOUNTER — Other Ambulatory Visit (HOSPITAL_COMMUNITY): Payer: Self-pay

## 2023-07-22 ENCOUNTER — Other Ambulatory Visit (HOSPITAL_COMMUNITY): Payer: Self-pay

## 2023-07-23 ENCOUNTER — Other Ambulatory Visit (HOSPITAL_COMMUNITY): Payer: Self-pay

## 2023-08-05 NOTE — Progress Notes (Signed)
[facility] - Clinic Note  08/13/2023    CHIEF COMPLAINT Patient presents for Retina Follow Up  HISTORY OF PRESENT ILLNESS: Amanda [name] is a 77 y.o. female who presents to the clinic today for:  HPI     Retina Follow Up   Patient presents with  Wet AMD.  In both eyes.  This started 6 weeks ago.  Duration of 6 weeks.  Since onset it is gradually worsening.  I, the attending physician,  performed the HPI with the patient and updated documentation appropriately.        Comments   6 week retina follow up ARMD OU and IVE OD pt is reporting that her vision in her right eye is little more cloudy over the last 3 weeks she has floaters she denies any flashes of light       Last edited by Rennis Chris, MD on 08/13/2023 10:44 PM.     Pt states vision seems stable   Referring physician: Alma Downs, PA-C [facility] [address] [place],  [place] [address]  HISTORICAL INFORMATION:  Selected notes from the MEDICAL RECORD NUMBER Referred by Alma Downs, PA on 11.16.21 for eval of dry to wet AMD conversion OS.   CURRENT MEDICATIONS: No current outpatient medications on file. (Ophthalmic Drugs)   No current facility-administered medications for this visit. (Ophthalmic Drugs)   Current Outpatient Medications (Other)  Medication Sig   acalabrutinib maleate (CALQUENCE) 100 MG tablet Take 1 tablet (100 mg total) by mouth 2 (two) times daily.   acetaminophen (TYLENOL) 500 MG tablet Take 500 mg by mouth every 6 (six) hours as needed for pain.   acyclovir (ZOVIRAX) 400 MG tablet Take 1 tablet (400 mg total) by mouth daily.   amLODipine (NORVASC) 5 MG tablet Take 5 mg by mouth daily.   atorvastatin (LIPITOR) 40 MG tablet Take 1 tablet by mouth daily.   Calcium Carb-Cholecalciferol (CALCIUM + D3) 600-200 MG-UNIT TABS Take 1 tablet by mouth 3 (three) times a week.    fluticasone (FLONASE) 50 MCG/ACT nasal spray Place 2 sprays into the  nose daily.   folic acid (FOLVITE) 1 MG tablet Take 1 mg by mouth daily.   hydrochlorothiazide (HYDRODIURIL) 25 MG tablet Take 25 mg by mouth daily.   Multiple Vitamin (MULTIVITAMIN) tablet Take 1 tablet by mouth daily.   Multiple Vitamins-Minerals (EYE VITAMINS & MINERALS) TABS Take by mouth.   potassium chloride SA (K-DUR,KLOR-CON) 20 MEQ tablet Take 20 mEq by mouth 3 (three) times a week. Mon, Wed, Fri   No current facility-administered medications for this visit. (Other)   REVIEW OF SYSTEMS: ROS   Positive for: Cardiovascular, Eyes Negative for: Constitutional, Gastrointestinal, Neurological, Skin, Genitourinary, Musculoskeletal, HENT, Endocrine, Respiratory, Psychiatric, Allergic/Imm, Heme/Lymph Last edited by Etheleen Mayhew, COT on 08/13/2023  2:01 PM.        ALLERGIES Allergies  Allergen Reactions   Codeine Nausea And Vomiting   Demerol [Meperidine] Nausea And Vomiting   Morphine And Codeine Nausea And Vomiting   Ciprofloxacin Rash   Sulfa Antibiotics Rash   PAST MEDICAL HISTORY Past Medical History:  Diagnosis Date   A-fib [facility])    Adenopathy    Atrial fibrillation (HCC)    CLL (chronic lymphocytic leukemia) (HCC) 08/31/2013   HTN (hypertension)    Hyperlipidemia    Hypertensive retinopathy    Lymphocytosis    Macular degeneration    OU   Past Surgical History:  Procedure Laterality  Date   ABDOMINAL HYSTERECTOMY     endometriosis, fibroid   APPENDECTOMY     BREAST BIOPSY     benign cyst.   CATARACT EXTRACTION Bilateral 2019   CHOLECYSTECTOMY     COLONOSCOPY  11/2012   Per Dr. Matthias Hughs.  neg.    EYE SURGERY     IR FLUORO GUIDE PORT INSERTION RIGHT  08/12/2017   IR REMOVAL TUN ACCESS W/ PORT W/O FL MOD SED  01/29/2018   IR US GUIDE VASC ACCESS RIGHT  08/12/2017   SQUAMOUS CELL CARCINOMA EXCISION  12/28/2020   Excised from top of head   THUMB ARTHROSCOPY     TOTAL HIP ARTHROPLASTY  2010   FAMILY HISTORY Family History  Problem Relation Age of  Onset   Renal Disease Mother    Cancer Mother        colon cancer   Cancer Father        colon cancer   SOCIAL HISTORY Social History   Tobacco Use   Smoking status: Never   Smokeless tobacco: Never  Vaping Use   Vaping status: Never Used  Substance Use Topics   Alcohol use: No   Drug use: No       OPHTHALMIC EXAM: Base Eye Exam     Visual Acuity (Snellen - Linear)       Right Left   Dist cc 20/25 20/20 -1   Dist ph cc 20/20 -3          Tonometry (Tonopen, 2:07 PM)       Right Left   Pressure 15 16         Pupils       Pupils Dark Light Shape React APD   Right PERRL 3 2 Round Brisk None   Left PERRL 3 2 Round Brisk None         Visual Fields       Left Right    Full Full         Extraocular Movement       Right Left    Full, Ortho Full, Ortho         Neuro/Psych     Oriented x3: Yes   Mood/Affect: Normal         Dilation     Both eyes: 2.5% Phenylephrine @ 2:07 PM           Slit Lamp and Fundus Exam     Slit Lamp Exam       Right Left   Lids/Lashes Dermatochalasis - upper lid, mild Meibomian gland dysfunction Dermatochalasis - upper lid, mild Meibomian gland dysfunction   Conjunctiva/Sclera White and quiet White and quiet   Cornea Trace Debris in tear film, well healed temporal cataract wounds, 1+ pigmented Guttata, trace PEE well healed temporal cataract wounds, 2+ Punctate epithelial erosions   Anterior Chamber Deep and quiet Deep and quiet   Iris Round and dilated Round and dilated   Lens PC IOL in good position, trace PCO PC IOL in good position   Anterior Vitreous Trace Vitreous syneresis, Posterior vitreous detachment Trace Vitreous syneresis, Posterior vitreous detachment         Fundus Exam       Right Left   Disc Compact, trace pallor, Sharp rim, mild PPA Compact, Pink and Sharp, temporal PPP   C/D Ratio 0.1 0.2   Macula Blunted foveal reflex, Drusen, RPE mottling, clumping and early atrophy, shallow PED  with trace SRF -- improved Blunted foveal reflex, +  CNV, Drusen, RPE mottling and clumping; trace shallow SRF -- slightly increased, no heme   Vessels attenuated, Tortuous attenuated, Tortuous   Periphery Attached; mild reticular degeneration, no heme Attached, reticular degeneration, No heme           Refraction     Wearing Rx       Sphere Cylinder Axis Add   Right -0.25 +0.75 165 +2.50   Left -0.50 +0.50 012 +2.50    Type: PAL           IMAGING AND PROCEDURES  Imaging and Procedures for 08/13/2023  OCT, Retina - OU - Both Eyes       Right Eye Quality was good. Central Foveal Thickness: 259. Progression has improved. Findings include normal foveal contour, no IRF, no SRF, retinal drusen , subretinal hyper-reflective material, intraretinal hyper-reflective material, pigment epithelial detachment, outer retinal atrophy (Mild interval improvement in shallow SRF and IRHM overlying stable low PED temporal macula, no IRF).   Left Eye Quality was good. Central Foveal Thickness: 296. Progression has worsened. Findings include no IRF, no SRF, abnormal foveal contour, retinal drusen , subretinal hyper-reflective material, choroidal neovascular membrane, pigment epithelial detachment, outer retinal atrophy (Mild interval increase in SRF overlying stable PED).   Notes *Images captured and stored on drive  Diagnosis / Impression:  OD: exu ARMD -- Mild interval improvement in shallow SRF and IRHM overlying stable low PED temporal macula, no IRF OS: exu ARMD -- Mild interval increase in SRF overlying stable PED  Clinical management:  See below  Abbreviations: NFP - Normal foveal profile. CME - cystoid macular edema. PED - pigment epithelial detachment. IRF - intraretinal fluid. SRF - subretinal fluid. EZ - ellipsoid zone. ERM - epiretinal membrane. ORA - outer retinal atrophy. ORT - outer retinal tubulation. SRHM - subretinal hyper-reflective material. IRHM - intraretinal  hyper-reflective material      Intravitreal Injection, Pharmacologic Agent - OS - Left Eye       Time Out 08/13/2023. 2:45 PM. Confirmed correct patient, procedure, site, and patient consented.   Anesthesia Topical anesthesia was used. Anesthetic medications included Lidocaine 2%, Proparacaine 0.5%.   Procedure Preparation included 5% betadine to ocular surface, eyelid speculum. A (32g) needle was used.   Injection: 6 mg faricimab-svoa 6 MG/0.05ML   Route: Intravitreal, Site: Left Eye   NDC: O8010301, Lot: [id number], Expiration date: 07/11/2025, Waste: 0 mL   Post-op Post injection exam found visual acuity of at least counting fingers. The patient tolerated the procedure well. There were no complications. The patient received written and verbal post procedure care education.      Intravitreal Injection, Pharmacologic Agent - OD - Right Eye       Time Out 08/13/2023. 2:53 PM. Confirmed correct patient, procedure, site, and patient consented.   Anesthesia Topical anesthesia was used. Anesthetic medications included Lidocaine 2%, Proparacaine 0.5%.   Procedure Preparation included 5% betadine to ocular surface, eyelid speculum. A (32g) needle was used.   Injection: 2 mg aflibercept 2 MG/0.05ML   Route: Intravitreal, Site: Right Eye   NDC: L6038910, Lot: [id number], Expiration date: 09/10/2024, Waste: 0 mL   Post-op Post injection exam found visual acuity of at least counting fingers. The patient tolerated the procedure well. There were no complications. The patient received written and verbal post procedure care education. Post injection medications were not given.             ASSESSMENT/PLAN:    ICD-10-CM   1. Exudative age-related macular degeneration of left  eye with active choroidal neovascularization (HCC)  H35.3221 OCT, Retina - OU - Both Eyes    Intravitreal Injection, Pharmacologic Agent - OS - Left Eye    faricimab-svoa (VABYSMO) 6mg /0.74mL  intravitreal injection    2. Exudative age-related macular degeneration of right eye with active choroidal neovascularization (HCC)  H35.3211 OCT, Retina - OU - Both Eyes    Intravitreal Injection, Pharmacologic Agent - OD - Right Eye    aflibercept (EYLEA) SOLN 2 mg    CANCELED: Intravitreal Injection, Pharmacologic Agent - OS - Left Eye    3. Essential hypertension  I10     4. Hypertensive retinopathy of both eyes  H35.033     5. Pseudophakia, both eyes  Z96.1       1. Exudative age related macular degeneration, OS  - [facility] pt with known history of nonexudative ARMD OU  - acute "haze over vision" OS -- onset, Friday 11.12.21 -- initial exam with central subretinal heme  - s/p IVA OS #1 (11.17.21), #2 (12.15.21), #3 (01.12.22), #4 (02.09.22), #5 (03.16.22), #6 (04.20.22), #7 (06.01.22), #8 (06.29.22)--IVA resistance  - s/p IVE OS #1 (07.27.22), #2 (08.24.22), #3 (09.21.22), #4 (10.19.22), #5 (11.16.22), #6 (12.16.22), #7 (01.18.23), #8 (02.15.23)--IVE resistance  - s/p IVV OS #1 SAMPLE (03.15.23), #2 (04.12.23), #3 (05.10.23), #4 (06.07.23), #5 (07.19.23), #6 (08.22.23), #7 (09.27.23), #8 (12.20.23), #9 (01.31.24), #10 (03.13.24), #11 (04.24.24), #12 (05.31.24), #13 (07.09.24), #14 (08.21.24)  - BCVA 20/20 OS -- improved from 20/25  - exam and OCT show OS: exu ARMD -- Mild interval increase in SRF overlying stable PED 6 wks  - recommend IVV OS #15 today, 10.02.24 with follow up in 6 weeks again - pt wishes to be treated with IVV OS - RBA of procedure discussed, questions answered - IVV informed consent obtained and signed 03.15.23 (OS) - see procedure note   - f/u 6 weeks -- DFE/OCT, possible injection  2. Exudative age related macular degeneration OD  - conversion to exudative ARMD noted on 03.15.23 exam  - exam with new SRH temporal macula -- stably improved today  - s/p IVE OD #1 (03.15.23), #2 (04.12.23), #3 (05.10.23), #4 (06.07.23), #5 (07.19.23), #6 (08.19.23), #7  (09.27.23), #8 (11.01.23), #9 (03.11.24), #10 (07.09.24), #11 (08.21.24)  - BCVA 20/20 OD -- improved from 20/25  - OCT OD: exu ARMD -- Mild interval increase in shallow SRF and IRHM overlying stable low PED temporal macula, no IRF at 6 weeks - recommend IVE OD #12 today, 10.02.24 w/ f/u in 6 wks again  - pt in agreement  - RBA of procedure discussed, questions answered - see procedure note - IVE informed consent obtained and signed, 03.15.23 (OD)  - f/u 6 weeks -- DFE/OCT, possible injxn  3,4. Hypertensive retinopathy OU - discussed importance of tight BP control - monitor     5. Pseudophakia OU  - s/p CE/IOL OU  - IOLs in good position, doing well  - monitor   Ophthalmic Meds Ordered this visit:  Meds ordered this encounter  Medications   faricimab-svoa (VABYSMO) 6mg /0.56mL intravitreal injection   aflibercept (EYLEA) SOLN 2 mg     Return in about 6 weeks (around 09/24/2023) for f/u exu ARMD OU, DFE, OCT.  There are no Patient Instructions on file for this visit.  This document serves as a record of services personally performed by Karie Chimera, MD, PhD. It was created on their behalf by De Blanch, an ophthalmic technician. The creation of this record is the  provider's dictation and/or activities during the visit.    Electronically signed by: De Blanch, OA, 08/13/23  10:46 PM  This document serves as a record of services personally performed by Karie Chimera, MD, PhD. It was created on their behalf by Glee Arvin. Manson Passey, OA an ophthalmic technician. The creation of this record is the provider's dictation and/or activities during the visit.    Electronically signed by: Glee Arvin. Manson Passey, OA 08/13/23 10:46 PM  Karie Chimera, M.D., Ph.D. Diseases & Surgery of the Retina and Vitreous [facility]  I have reviewed the above documentation for accuracy and completeness, and I agree with the above. Karie Chimera, M.D., Ph.D. 08/13/23 10:52  PM   Abbreviations: M myopia (nearsighted); A astigmatism; H hyperopia (farsighted); P presbyopia; Mrx spectacle prescription;  CTL contact lenses; OD right eye; OS left eye; OU both eyes  XT exotropia; ET esotropia; PEK punctate epithelial keratitis; PEE punctate epithelial erosions; DES dry eye syndrome; MGD meibomian gland dysfunction; ATs artificial tears; PFAT's preservative free artificial tears; NSC nuclear sclerotic cataract; PSC posterior subcapsular cataract; ERM epi-retinal membrane; PVD posterior vitreous detachment; RD retinal detachment; DM diabetes mellitus; DR diabetic retinopathy; NPDR non-proliferative diabetic retinopathy; PDR proliferative diabetic retinopathy; CSME clinically significant macular edema; DME diabetic macular edema; dbh dot blot hemorrhages; CWS cotton wool spot; POAG primary open angle glaucoma; C/D cup-to-disc ratio; HVF humphrey visual field; GVF goldmann visual field; OCT optical coherence tomography; IOP intraocular pressure; BRVO Branch retinal vein occlusion; CRVO central retinal vein occlusion; CRAO central retinal artery occlusion; BRAO branch retinal artery occlusion; RT retinal tear; SB scleral buckle; PPV pars plana vitrectomy; VH Vitreous hemorrhage; PRP panretinal laser photocoagulation; IVK intravitreal kenalog; VMT vitreomacular traction; MH Macular hole;  NVD neovascularization of the disc; NVE neovascularization elsewhere; AREDS age related eye disease study; ARMD age related macular degeneration; POAG primary open angle glaucoma; EBMD epithelial/anterior basement membrane dystrophy; ACIOL anterior chamber intraocular lens; IOL intraocular lens; PCIOL posterior chamber intraocular lens; Phaco/IOL phacoemulsification with intraocular lens placement; PRK photorefractive keratectomy; LASIK laser assisted in situ keratomileusis; HTN hypertension; DM diabetes mellitus; COPD chronic obstructive pulmonary disease

## 2023-08-07 DIAGNOSIS — Z23 Encounter for immunization: Secondary | ICD-10-CM | POA: Diagnosis not present

## 2023-08-12 DIAGNOSIS — R3129 Other microscopic hematuria: Secondary | ICD-10-CM | POA: Diagnosis not present

## 2023-08-12 DIAGNOSIS — R399 Unspecified symptoms and signs involving the genitourinary system: Secondary | ICD-10-CM | POA: Diagnosis not present

## 2023-08-13 ENCOUNTER — Encounter (INDEPENDENT_AMBULATORY_CARE_PROVIDER_SITE_OTHER): Payer: Self-pay | Admitting: Ophthalmology

## 2023-08-13 ENCOUNTER — Ambulatory Visit (INDEPENDENT_AMBULATORY_CARE_PROVIDER_SITE_OTHER): Payer: Medicare Other | Admitting: Ophthalmology

## 2023-08-13 DIAGNOSIS — H353231 Exudative age-related macular degeneration, bilateral, with active choroidal neovascularization: Secondary | ICD-10-CM | POA: Diagnosis not present

## 2023-08-13 DIAGNOSIS — I10 Essential (primary) hypertension: Secondary | ICD-10-CM | POA: Diagnosis not present

## 2023-08-13 DIAGNOSIS — H35033 Hypertensive retinopathy, bilateral: Secondary | ICD-10-CM

## 2023-08-13 DIAGNOSIS — Z961 Presence of intraocular lens: Secondary | ICD-10-CM | POA: Diagnosis not present

## 2023-08-13 DIAGNOSIS — H353211 Exudative age-related macular degeneration, right eye, with active choroidal neovascularization: Secondary | ICD-10-CM

## 2023-08-13 DIAGNOSIS — H353221 Exudative age-related macular degeneration, left eye, with active choroidal neovascularization: Secondary | ICD-10-CM

## 2023-08-13 MED ORDER — FARICIMAB-SVOA 6 MG/0.05ML IZ SOLN
6.0000 mg | INTRAVITREAL | Status: AC | PRN
Start: 2023-08-13 — End: 2023-08-13
  Administered 2023-08-13: 6 mg via INTRAVITREAL

## 2023-08-13 MED ORDER — AFLIBERCEPT 2MG/0.05ML IZ SOLN FOR KALEIDOSCOPE
2.0000 mg | INTRAVITREAL | Status: AC | PRN
Start: 2023-08-13 — End: 2023-08-13
  Administered 2023-08-13: 2 mg via INTRAVITREAL

## 2023-08-16 ENCOUNTER — Other Ambulatory Visit (HOSPITAL_COMMUNITY): Payer: Self-pay

## 2023-08-18 ENCOUNTER — Other Ambulatory Visit (HOSPITAL_COMMUNITY): Payer: Self-pay

## 2023-08-22 ENCOUNTER — Inpatient Hospital Stay: Payer: Medicare Other | Admitting: Hematology and Oncology

## 2023-08-22 ENCOUNTER — Encounter: Payer: Self-pay | Admitting: Hematology and Oncology

## 2023-08-22 ENCOUNTER — Inpatient Hospital Stay: Payer: Medicare Other | Attending: Hematology and Oncology

## 2023-08-22 VITALS — BP 125/53 | HR 68 | Temp 98.6°F | Resp 18 | Ht 61.0 in | Wt 143.6 lb

## 2023-08-22 DIAGNOSIS — Z79899 Other long term (current) drug therapy: Secondary | ICD-10-CM | POA: Insufficient documentation

## 2023-08-22 DIAGNOSIS — C9110 Chronic lymphocytic leukemia of B-cell type not having achieved remission: Secondary | ICD-10-CM | POA: Diagnosis not present

## 2023-08-22 LAB — CBC WITH DIFFERENTIAL/PLATELET
Abs Immature Granulocytes: 0.07 10*3/uL (ref 0.00–0.07)
Basophils Absolute: 0.2 10*3/uL — ABNORMAL HIGH (ref 0.0–0.1)
Basophils Relative: 1 %
Eosinophils Absolute: 0.2 10*3/uL (ref 0.0–0.5)
Eosinophils Relative: 1 %
HCT: 40.8 % (ref 36.0–46.0)
Hemoglobin: 13 g/dL (ref 12.0–15.0)
Immature Granulocytes: 0 %
Lymphocytes Relative: 82 %
Lymphs Abs: 28.7 10*3/uL — ABNORMAL HIGH (ref 0.7–4.0)
MCH: 27.5 pg (ref 26.0–34.0)
MCHC: 31.9 g/dL (ref 30.0–36.0)
MCV: 86.3 fL (ref 80.0–100.0)
Monocytes Absolute: 0.5 10*3/uL (ref 0.1–1.0)
Monocytes Relative: 2 %
Neutro Abs: 5 10*3/uL (ref 1.7–7.7)
Neutrophils Relative %: 14 %
Platelets: 214 10*3/uL (ref 150–400)
RBC: 4.73 MIL/uL (ref 3.87–5.11)
RDW: 14.3 % (ref 11.5–15.5)
Smear Review: NORMAL
WBC: 34.7 10*3/uL — ABNORMAL HIGH (ref 4.0–10.5)
nRBC: 0 % (ref 0.0–0.2)

## 2023-08-22 LAB — COMPREHENSIVE METABOLIC PANEL
ALT: 16 U/L (ref 0–44)
AST: 16 U/L (ref 15–41)
Albumin: 4.4 g/dL (ref 3.5–5.0)
Alkaline Phosphatase: 66 U/L (ref 38–126)
Anion gap: 5 (ref 5–15)
BUN: 16 mg/dL (ref 8–23)
CO2: 30 mmol/L (ref 22–32)
Calcium: 10.2 mg/dL (ref 8.9–10.3)
Chloride: 105 mmol/L (ref 98–111)
Creatinine, Ser: 0.81 mg/dL (ref 0.44–1.00)
GFR, Estimated: >60 mL/min (ref >60)
Glucose, Bld: 101 mg/dL — ABNORMAL HIGH (ref 70–99)
Potassium: 4 mmol/L (ref 3.5–5.1)
Sodium: 140 mmol/L (ref 135–145)
Total Bilirubin: 0.6 mg/dL (ref 0.3–1.2)
Total Protein: 7.1 g/dL (ref 6.5–8.1)

## 2023-08-22 NOTE — Progress Notes (Signed)
[facility] OFFICE PROGRESS NOTE  Patient Care Team: Gweneth Dimitri, MD as PCP - General (Family Medicine) O'Neal, Ronnald Ramp, MD as PCP - Cardiology (Cardiology) Bjorn Pippin, MD as Attending Physician (Urology) Othella Boyer, MD as Attending Physician (Cardiology) Artis Delay, MD as Consulting Physician (Hematology and Oncology)  HISTORY OF PRESENTING ILLNESS: Discussed the use of AI scribe software for clinical note transcription with the patient, who gave verbal consent to proceed.  History of Present Illness   The patient, on a current regimen for CLL, reports no adverse effects from the treatment. However, she has been experiencing significant discomfort in her left knee. She has scheduled an appointment with Dr. Antony Odea for further evaluation and potential intervention, including the possibility of knee replacement surgery. The patient understands that such a procedure would require a temporary cessation of her current medication.  In addition to her knee issue, the patient has been monitoring her white blood cell count, which has shown significant improvement. She has had no issues with medication refills and is up-to-date with her COVID vaccination. She plans to receive her flu shot in the following week. The patient expresses satisfaction with her treatment progress and is hopeful that her white blood cell count will normalize in the next three months. She understands that once her white blood cell count returns to normal, a repeat CT scan will be necessary. The patient is committed to continuing her current medication even after her white blood cell count normalizes, with a plan to reassess after two years.         Assessment and Plan    CLL Significant improvement in white blood cell count over the past three months with current treatment. No issues with medication refills. -Continue current treatment. -Plan to repeat CT scan once white blood cell count returns  to normal.  Left Knee Pain Patient reports significant discomfort and has an appointment with Dr. Antony Odea. Discussed potential need for temporary cessation of current medication in the event of knee replacement surgery. -Endorsement of potential injection or other treatment as recommended by Dr. Antony Odea. -If knee replacement surgery is recommended, plan to hold current medication one week prior to surgery and resume one week post-surgery.  General Health Maintenance -Plan to receive flu shot next week. -Schedule follow-up in three months.          No orders of the defined types were placed in this encounter.   All questions were answered. The patient knows to call the clinic with any problems, questions or concerns. The total time spent in the appointment was 20 minutes encounter with patients including review of chart and various tests results, discussions about plan of care and coordination of care plan   Artis Delay, MD 08/22/2023 12:43 PM  REVIEW OF SYSTEMS:  All other systems were reviewed with the patient and are negative.  I have reviewed the past medical history, past surgical history, social history and family history with the patient and they are unchanged from previous note.  ALLERGIES:  is allergic to codeine, demerol [meperidine], morphine and codeine, ciprofloxacin, and sulfa antibiotics.  MEDICATIONS:  Current Outpatient Medications  Medication Sig Dispense Refill   acalabrutinib maleate (CALQUENCE) 100 MG tablet Take 1 tablet (100 mg total) by mouth 2 (two) times daily. 60 tablet 11   acetaminophen (TYLENOL) 500 MG tablet Take 500 mg by mouth every 6 (six) hours as needed for pain.     acyclovir (ZOVIRAX) 400 MG tablet Take 1 tablet (400 mg  total) by mouth daily. 30 tablet 6   amLODipine (NORVASC) 5 MG tablet Take 5 mg by mouth daily.     atorvastatin (LIPITOR) 40 MG tablet Take 1 tablet by mouth daily.     Calcium Carb-Cholecalciferol (CALCIUM + D3) 600-200 MG-UNIT  TABS Take 1 tablet by mouth 3 (three) times a week.      fluticasone (FLONASE) 50 MCG/ACT nasal spray Place 2 sprays into the nose daily.     folic acid (FOLVITE) 1 MG tablet Take 1 mg by mouth daily.     hydrochlorothiazide (HYDRODIURIL) 25 MG tablet Take 25 mg by mouth daily.     Multiple Vitamin (MULTIVITAMIN) tablet Take 1 tablet by mouth daily.     Multiple Vitamins-Minerals (EYE VITAMINS & MINERALS) TABS Take by mouth.     potassium chloride SA (K-DUR,KLOR-CON) 20 MEQ tablet Take 20 mEq by mouth 3 (three) times a week. Mon, Wed, Fri     No current facility-administered medications for this visit.    SUMMARY OF ONCOLOGIC HISTORY: Oncology History Overview Note  Del 13q   CLL (chronic lymphocytic leukemia) (HCC)  02/09/2013 Pathology Results   Peripheral Blood Flow Cytometry - FINDINGS CONSISTENT WITH CHRONIC LYMPHOCYTIC LEUKEMIA   09/02/2013 Pathology Results   FISH positive for deletion 13 q   05/22/2017 Imaging   Worsening mesenteric and retroperitoneal adenopathy concerning for worsening lymphoproliferative disorder/lymphoma.   Small cysts in the liver and kidneys are stable.   Prior cholecystectomy.   Aortic atherosclerosis.   Left colonic diverticulosis without active diverticulitis.   08/11/2017 Imaging   Chest Impression:  1. Mild mediastinal and hilar lymphadenopathy. 2. Clusters small axial lymph nodes.  Abdomen / Pelvis Impression:  1. Mild periaortic retroperitoneal adenopathy and moderate central mesenteric adenopathy not changed comparison CT. 2. Mild iliac adenopathy 3. Normal volume spleen. 4. No explanation for RIGHT lower quadrant pain. Cluster of lymph nodes in the ileocecal mesenteries similar to prior.   08/12/2017 Procedure   Placement of single lumen port a cath via right internal jugular vein. The catheter tip lies at the cavo-atrial junction. A power injectable port a cath was placed and is ready for immediate use.   08/14/2017 - 11/14/2017  Chemotherapy   She received Bendamustine and Rituxan   11/12/2017 Imaging   1. Continued decrease in mediastinal and hilar lymph nodes with interval resolution of the abdominal and pelvic lymphadenopathy seen on the prior study. There is no lymphadenopathy in the chest, abdomen, or pelvis by CT size criteria on today's study. No new or progressive interval findings. 2.  Aortic Atherosclerois (ICD10-170.0) 3. Hepatic and renal cysts, stable.   12/18/2017 Adverse Reaction   We are unable to resume cycle 5 of chemotherapy due to prolonged pancytopenia   01/29/2018 Procedure   Successful right IJ vein Port-A-Cath explant.   02/25/2023 Imaging   CT CHEST ABDOMEN PELVIS W CONTRAST  Result Date: 02/25/2023 CLINICAL DATA:  77 year old female with history of CLL originally diagnosed in 2019. Increasing white blood cell count and lymphadenopathy. * Tracking Code: BO * EXAM: CT CHEST, ABDOMEN, AND PELVIS WITH CONTRAST TECHNIQUE: Multidetector CT imaging of the chest, abdomen and pelvis was performed following the standard protocol during bolus administration of intravenous contrast. RADIATION DOSE REDUCTION: This exam was performed according to the departmental dose-optimization program which includes automated exposure control, adjustment of the mA and/or kV according to patient size and/or use of iterative reconstruction technique. CONTRAST:  OMNIPAQUE IOHEXOL 300 MG/ML  SOLN COMPARISON:  CT of the  chest, abdomen and pelvis 11/12/2017. FINDINGS: CT CHEST FINDINGS Cardiovascular: Heart size is normal. There is no significant pericardial fluid, thickening or pericardial calcification. Aortic atherosclerosis. No definite coronary artery calcifications. Mediastinum/Nodes: Multiple enlarged and borderline enlarged mediastinal and bilateral hilar lymph nodes are noted measuring up to 1.6 cm in short axis in the low right paratracheal nodal station and 1.4 cm in short axis in the right hilar region. Multiple  enlarged and borderline enlarged bilateral axillary and subpectoral lymph nodes are also noted measuring up to 1 cm in the left axillary region (axial image 12 of series 2). Esophagus is unremarkable in appearance. Lungs/Pleura: 3 mm right lower lobe pulmonary nodule (axial image 110 of series 4). No other larger more suspicious appearing pulmonary nodules or masses are noted. No acute consolidative airspace disease. No pleural effusions. Musculoskeletal: There are no aggressive appearing lytic or blastic lesions noted in the visualized portions of the skeleton. CT ABDOMEN PELVIS FINDINGS Hepatobiliary: Multiple small well-defined low-attenuation lesions scattered throughout the hepatic parenchyma. The largest of these are compatible with cysts, measuring up to 1.4 cm in diameter. The smaller lesions are too small to definitively characterize, but also favored represent small cysts and/or biliary hamartomas. Less well-defined areas of low attenuation are also noted, particularly adjacent to the gallbladder fossa, where the largest of these measures 2.0 x 1.5 cm (axial image 61 of series 2), slightly more pronounced than the prior examination from 2019, incompletely characterize, but favored to represent focal fatty infiltration. Status post cholecystectomy. No intrahepatic biliary ductal dilatation. Common bile duct measures 1.3 cm in the porta hepatis, likely reflective of benign post cholecystectomy physiology. Pancreas: No pancreatic mass. No pancreatic ductal dilatation. No pancreatic or peripancreatic fluid collections or inflammatory changes. Spleen: Spleen is enlarged measuring 12.4 x 6.2 x 10.4 cm (estimated splenic volume of 400 mL) . Adrenals/Urinary Tract: Subcentimeter low-attenuation lesions in both kidneys, too small to definitively characterize, but statistically likely to represent tiny cysts (no imaging follow-up recommended). No hydroureteronephrosis. Urinary bladder is normal in appearance.  Bilateral adrenal glands are normal in appearance. Stomach/Bowel: Normal appearance of the stomach. No pathologic dilatation of small bowel or colon. Numerous colonic diverticula are noted, without surrounding inflammatory changes to suggest an acute diverticulitis at this time. Vascular/Lymphatic: Atherosclerosis in the abdominal aorta and pelvic vasculature, without evidence of aneurysm or dissection. Extensive lymphadenopathy is noted in the abdomen and pelvis, predominantly in the small bowel mesentery where the largest lymph nodes measure up to 2.2 cm in short axis (axial image 76 of series 2). Multiple prominent borderline enlarged and mildly enlarged retroperitoneal lymph nodes are also noted measuring up to 1.4 cm in short axis in the low left para-aortic nodal station (axial image 75 of series 2). The largest pelvic lymph nodes are in the left obturator nodal station (axial image 96 of series 2) measuring 1.3 cm in short axis. Reproductive: Status post hysterectomy. Ovaries are not confidently identified may be surgically absent or atrophic. Other: No significant volume of ascites.  No pneumoperitoneum. Musculoskeletal: Status post right hip hip arthroplasty. There are no aggressive appearing lytic or blastic lesions noted in the visualized portions of the skeleton. IMPRESSION: 1. Extensive lymphadenopathy throughout the chest, abdomen and pelvis, as above, compatible with recurrent disease. 2. Mild splenomegaly. 3. Poorly defined low-attenuation regions in the liver, most evident in segment 5 adjacent to the gallbladder fossa, favored to represent areas of focal fatty infiltration. Attention on follow-up imaging is recommended to ensure stability. Alternatively, definitive characterization could  be obtained with MRI of the abdomen with and without IV gadolinium if of clinical concern. 4. 3 mm right lower lobe pulmonary nodule, nonspecific, but statistically likely benign. Attention at time of routine  imaging follow-up is recommended to ensure stability or regression. 5. Aortic atherosclerosis. 6. Additional incidental imaging findings, as above. Electronically Signed   By: Trudie Reed M.D.   On: 02/25/2023 08:44   MM 3D SCREENING MAMMOGRAM BILATERAL BREAST  Result Date: 02/18/2023 CLINICAL DATA:  Screening. EXAM: DIGITAL SCREENING BILATERAL MAMMOGRAM WITH TOMOSYNTHESIS AND CAD TECHNIQUE: Bilateral screening digital craniocaudal and mediolateral oblique mammograms were obtained. Bilateral screening digital breast tomosynthesis was performed. The images were evaluated with computer-aided detection. COMPARISON:  Previous exam(s). ACR Breast Density Category b: There are scattered areas of fibroglandular density. FINDINGS: There are no findings suspicious for malignancy. IMPRESSION: No mammographic evidence of malignancy. A result letter of this screening mammogram will be mailed directly to the patient. RECOMMENDATION: Screening mammogram in one year. (Code:[id number]-B-01Y) BI-RADS CATEGORY  1: Negative. Electronically Signed   By: Sherian Rein M.D.   On: 02/18/2023 11:26      03/21/2023 -  Chemotherapy   She started on Calquence     PHYSICAL EXAMINATION: ECOG PERFORMANCE STATUS: 0 - Asymptomatic  Vitals:   08/22/23 1024  BP: (!) 125/53  Pulse: 68  Resp: 18  Temp: 98.6 F (37 C)  SpO2: 98%   Filed Weights   08/22/23 1024  Weight: 143 lb 9.6 oz (65.1 kg)    GENERAL:alert, no distress and comfortable  LABORATORY DATA:  I have reviewed the data as listed    Component Value Date/Time   NA 140 08/22/2023 1005   NA 141 11/13/2017 0753   K 4.0 08/22/2023 1005   K 4.1 11/13/2017 0753   CL 105 08/22/2023 1005   CL 104 02/08/2013 1454   CO2 30 08/22/2023 1005   CO2 27 11/13/2017 0753   GLUCOSE 101 (H) 08/22/2023 1005   GLUCOSE 113 11/13/2017 0753   GLUCOSE 103 (H) 02/08/2013 1454   BUN 16 08/22/2023 1005   BUN 11.6 11/13/2017 0753   CREATININE 0.81 08/22/2023 1005   CREATININE  0.84 04/10/2023 1128   CREATININE 0.8 11/13/2017 0753   CALCIUM 10.2 08/22/2023 1005   CALCIUM 9.3 11/13/2017 0753   PROT 7.1 08/22/2023 1005   PROT 6.2 (L) 11/13/2017 0753   ALBUMIN 4.4 08/22/2023 1005   ALBUMIN 3.3 (L) 11/13/2017 0753   AST 16 08/22/2023 1005   AST 16 04/10/2023 1128   AST 24 11/13/2017 0753   ALT 16 08/22/2023 1005   ALT 14 04/10/2023 1128   ALT 24 11/13/2017 0753   ALKPHOS 66 08/22/2023 1005   ALKPHOS 83 11/13/2017 0753   BILITOT 0.6 08/22/2023 1005   BILITOT 0.7 04/10/2023 1128   BILITOT 0.38 11/13/2017 0753   GFRNONAA >60 08/22/2023 1005   GFRNONAA >60 04/10/2023 1128   GFRAA >60 01/12/2019 0938    No results found for: "SPEP", "UPEP"  Lab Results  Component Value Date   WBC 34.7 (H) 08/22/2023   NEUTROABS 5.0 08/22/2023   HGB 13.0 08/22/2023   HCT 40.8 08/22/2023   MCV 86.3 08/22/2023   PLT 214 08/22/2023      Chemistry      Component Value Date/Time   NA 140 08/22/2023 1005   NA 141 11/13/2017 0753   K 4.0 08/22/2023 1005   K 4.1 11/13/2017 0753   CL 105 08/22/2023 1005   CL 104 02/08/2013 1454  CO2 30 08/22/2023 1005   CO2 27 11/13/2017 0753   BUN 16 08/22/2023 1005   BUN 11.6 11/13/2017 0753   CREATININE 0.81 08/22/2023 1005   CREATININE 0.84 04/10/2023 1128   CREATININE 0.8 11/13/2017 0753      Component Value Date/Time   CALCIUM 10.2 08/22/2023 1005   CALCIUM 9.3 11/13/2017 0753   ALKPHOS 66 08/22/2023 1005   ALKPHOS 83 11/13/2017 0753   AST 16 08/22/2023 1005   AST 16 04/10/2023 1128   AST 24 11/13/2017 0753   ALT 16 08/22/2023 1005   ALT 14 04/10/2023 1128   ALT 24 11/13/2017 0753   BILITOT 0.6 08/22/2023 1005   BILITOT 0.7 04/10/2023 1128   BILITOT 0.38 11/13/2017 0753

## 2023-08-26 ENCOUNTER — Other Ambulatory Visit (HOSPITAL_COMMUNITY): Payer: Self-pay

## 2023-08-26 ENCOUNTER — Other Ambulatory Visit (HOSPITAL_COMMUNITY): Payer: Self-pay | Admitting: Pharmacy Technician

## 2023-08-26 DIAGNOSIS — R350 Frequency of micturition: Secondary | ICD-10-CM | POA: Diagnosis not present

## 2023-08-26 DIAGNOSIS — Z6826 Body mass index (BMI) 26.0-26.9, adult: Secondary | ICD-10-CM | POA: Diagnosis not present

## 2023-08-26 DIAGNOSIS — Z23 Encounter for immunization: Secondary | ICD-10-CM | POA: Diagnosis not present

## 2023-08-26 NOTE — Progress Notes (Signed)
Specialty Pharmacy Refill Coordination Note  Amanda [name] is a 77 y.o. female contacted today regarding refills of specialty medication(s) Acalabrutinib Maleate   Patient requested Delivery   Delivery date: 09/02/23   Verified address: [address]  [place]    Medication will be filled on 09/01/23.

## 2023-09-15 ENCOUNTER — Other Ambulatory Visit (HOSPITAL_COMMUNITY): Payer: Self-pay

## 2023-09-16 DIAGNOSIS — Z85828 Personal history of other malignant neoplasm of skin: Secondary | ICD-10-CM | POA: Diagnosis not present

## 2023-09-16 DIAGNOSIS — L570 Actinic keratosis: Secondary | ICD-10-CM | POA: Diagnosis not present

## 2023-09-16 DIAGNOSIS — D225 Melanocytic nevi of trunk: Secondary | ICD-10-CM | POA: Diagnosis not present

## 2023-09-16 DIAGNOSIS — L821 Other seborrheic keratosis: Secondary | ICD-10-CM | POA: Diagnosis not present

## 2023-09-16 DIAGNOSIS — L565 Disseminated superficial actinic porokeratosis (DSAP): Secondary | ICD-10-CM | POA: Diagnosis not present

## 2023-09-16 DIAGNOSIS — C44519 Basal cell carcinoma of skin of other part of trunk: Secondary | ICD-10-CM | POA: Diagnosis not present

## 2023-09-16 DIAGNOSIS — D485 Neoplasm of uncertain behavior of skin: Secondary | ICD-10-CM | POA: Diagnosis not present

## 2023-09-16 DIAGNOSIS — L578 Other skin changes due to chronic exposure to nonionizing radiation: Secondary | ICD-10-CM | POA: Diagnosis not present

## 2023-09-18 NOTE — Progress Notes (Signed)
[facility] - Clinic Note  09/24/2023    CHIEF COMPLAINT Patient presents for Retina Follow Up  HISTORY OF PRESENT ILLNESS: Amanda [name] is a 77 y.o. female who presents to the clinic today for:  HPI     Retina Follow Up   Patient presents with  Wet AMD.  In left eye.  Severity is moderate.  Duration of 6 weeks.  Since onset it is stable.  I, the attending physician,  performed the HPI with the patient and updated documentation appropriately.        Comments   Patient states vision the same OU.      Last edited by Rennis Chris, MD on 09/24/2023 11:35 PM.     Referring physician: Alma Downs, PA-C [facility] [address] [place],  [place] [address]  HISTORICAL INFORMATION:  Selected notes from the MEDICAL RECORD NUMBER Referred by Alma Downs, PA on 11.16.21 for eval of dry to wet AMD conversion OS.   CURRENT MEDICATIONS: No current outpatient medications on file. (Ophthalmic Drugs)   No current facility-administered medications for this visit. (Ophthalmic Drugs)   Current Outpatient Medications (Other)  Medication Sig   acalabrutinib maleate (CALQUENCE) 100 MG tablet Take 1 tablet (100 mg total) by mouth 2 (two) times daily.   acetaminophen (TYLENOL) 500 MG tablet Take 500 mg by mouth every 6 (six) hours as needed for pain.   acyclovir (ZOVIRAX) 400 MG tablet Take 1 tablet (400 mg total) by mouth daily.   amLODipine (NORVASC) 5 MG tablet Take 5 mg by mouth daily.   atorvastatin (LIPITOR) 40 MG tablet Take 1 tablet by mouth daily.   Calcium Carb-Cholecalciferol (CALCIUM + D3) 600-200 MG-UNIT TABS Take 1 tablet by mouth 3 (three) times a week.    fluticasone (FLONASE) 50 MCG/ACT nasal spray Place 2 sprays into the nose daily.   folic acid (FOLVITE) 1 MG tablet Take 1 mg by mouth daily.   hydrochlorothiazide (HYDRODIURIL) 25 MG tablet Take 25 mg by mouth daily.   Multiple Vitamin (MULTIVITAMIN) tablet Take 1  tablet by mouth daily.   Multiple Vitamins-Minerals (EYE VITAMINS & MINERALS) TABS Take by mouth.   potassium chloride SA (K-DUR,KLOR-CON) 20 MEQ tablet Take 20 mEq by mouth 3 (three) times a week. Mon, Wed, Fri   No current facility-administered medications for this visit. (Other)   REVIEW OF SYSTEMS: ROS   Positive for: Cardiovascular, Eyes Negative for: Constitutional, Gastrointestinal, Neurological, Skin, Genitourinary, Musculoskeletal, HENT, Endocrine, Respiratory, Psychiatric, Allergic/Imm, Heme/Lymph Last edited by Doreene Nest, COT on 09/24/2023  2:12 PM.     ALLERGIES Allergies  Allergen Reactions   Codeine Nausea And Vomiting   Demerol [Meperidine] Nausea And Vomiting   Morphine And Codeine Nausea And Vomiting   Ciprofloxacin Rash   Sulfa Antibiotics Rash   PAST MEDICAL HISTORY Past Medical History:  Diagnosis Date   A-fib [facility])    Adenopathy    Atrial fibrillation (HCC)    CLL (chronic lymphocytic leukemia) (HCC) 08/31/2013   HTN (hypertension)    Hyperlipidemia    Hypertensive retinopathy    Lymphocytosis    Macular degeneration    OU   Past Surgical History:  Procedure Laterality Date   ABDOMINAL HYSTERECTOMY     endometriosis, fibroid   APPENDECTOMY     BREAST BIOPSY     benign cyst.   CATARACT EXTRACTION Bilateral 2019   CHOLECYSTECTOMY     COLONOSCOPY  11/2012   Per Dr.  Buccini.  neg.    EYE SURGERY     IR FLUORO GUIDE PORT INSERTION RIGHT  08/12/2017   IR REMOVAL TUN ACCESS W/ PORT W/O FL MOD SED  01/29/2018   IR US GUIDE VASC ACCESS RIGHT  08/12/2017   SQUAMOUS CELL CARCINOMA EXCISION  12/28/2020   Excised from top of head   THUMB ARTHROSCOPY     TOTAL HIP ARTHROPLASTY  2010   FAMILY HISTORY Family History  Problem Relation Age of Onset   Renal Disease Mother    Cancer Mother        colon cancer   Cancer Father        colon cancer   SOCIAL HISTORY Social History   Tobacco Use   Smoking status: Never   Smokeless tobacco: Never   Vaping Use   Vaping status: Never Used  Substance Use Topics   Alcohol use: No   Drug use: No       OPHTHALMIC EXAM: Base Eye Exam     Visual Acuity (Snellen - Linear)       Right Left   Dist cc 20/20 -2 20/20 -2    Correction: Glasses         Tonometry (Tonopen, 2:18 PM)       Right Left   Pressure 14 12         Pupils       Dark Light Shape React APD   Right 3 2 Round Brisk None   Left 3 2 Round Brisk None         Visual Fields (Counting fingers)       Left Right    Full Full         Extraocular Movement       Right Left    Full, Ortho Full, Ortho         Neuro/Psych     Oriented x3: Yes   Mood/Affect: Normal         Dilation     Both eyes: 1.0% Mydriacyl, 2.5% Phenylephrine @ 2:18 PM           Slit Lamp and Fundus Exam     Slit Lamp Exam       Right Left   Lids/Lashes Dermatochalasis - upper lid, mild Meibomian gland dysfunction Dermatochalasis - upper lid, mild Meibomian gland dysfunction   Conjunctiva/Sclera White and quiet White and quiet   Cornea Trace Debris in tear film, well healed temporal cataract wounds, 1+ pigmented Guttata, trace PEE well healed temporal cataract wounds, 2+ Punctate epithelial erosions   Anterior Chamber Deep and quiet Deep and quiet   Iris Round and dilated Round and dilated   Lens PC IOL in good position, trace PCO PC IOL in good position   Anterior Vitreous Trace Vitreous syneresis, Posterior vitreous detachment Trace Vitreous syneresis, Posterior vitreous detachment         Fundus Exam       Right Left   Disc Compact, trace pallor, Sharp rim, mild PPA Compact, Pink and Sharp, temporal PPP   C/D Ratio 0.1 0.2   Macula Blunted foveal reflex, Drusen, RPE mottling, clumping and early atrophy, shallow PED with trace SRF Blunted foveal reflex, +CNV, Drusen, RPE mottling and clumping; trace shallow SRF -- persistent, no heme   Vessels attenuated, Tortuous attenuated, Tortuous   Periphery  Attached; mild reticular degeneration, no heme Attached, reticular degeneration, No heme           Refraction  Wearing Rx       Sphere Cylinder Axis Add   Right -0.25 +0.75 165 +2.50   Left -0.50 +0.50 012 +2.50    Type: PAL           IMAGING AND PROCEDURES  Imaging and Procedures for 09/24/2023  OCT, Retina - OU - Both Eyes       Right Eye Quality was good. Central Foveal Thickness: 276. Progression has worsened. Findings include normal foveal contour, no IRF, no SRF, retinal drusen , subretinal hyper-reflective material, intraretinal hyper-reflective material, pigment epithelial detachment, outer retinal atrophy (Mild interval increase in shallow SRF centrally overlying stable low PED, no IRF).   Left Eye Quality was good. Central Foveal Thickness: 288. Progression has improved. Findings include no IRF, no SRF, abnormal foveal contour, retinal drusen , subretinal hyper-reflective material, choroidal neovascular membrane, pigment epithelial detachment, outer retinal atrophy (Persistent SRF overlying stable PED -- slightly improved).   Notes *Images captured and stored on drive  Diagnosis / Impression:  OD: exu ARMD -- Mild interval increase in shallow SRF centrally overlying stable low PED, no IRF OS: exu ARMD -- Persistent SRF overlying stable PED -- slightly improved  Clinical management:  See below  Abbreviations: NFP - Normal foveal profile. CME - cystoid macular edema. PED - pigment epithelial detachment. IRF - intraretinal fluid. SRF - subretinal fluid. EZ - ellipsoid zone. ERM - epiretinal membrane. ORA - outer retinal atrophy. ORT - outer retinal tubulation. SRHM - subretinal hyper-reflective material. IRHM - intraretinal hyper-reflective material      Intravitreal Injection, Pharmacologic Agent - OD - Right Eye       Time Out 09/24/2023. 3:04 PM. Confirmed correct patient, procedure, site, and patient consented.   Anesthesia Topical anesthesia was  used. Anesthetic medications included Lidocaine 2%, Proparacaine 0.5%.   Procedure Preparation included 5% betadine to ocular surface, eyelid speculum. A (32g) needle was used.   Injection: 2 mg aflibercept 2 MG/0.05ML   Route: Intravitreal, Site: Right Eye   NDC: L6038910, Lot: [id number], Expiration date: 11/10/2024, Waste: 0 mL   Post-op Post injection exam found visual acuity of at least counting fingers. The patient tolerated the procedure well. There were no complications. The patient received written and verbal post procedure care education. Post injection medications were not given.      Intravitreal Injection, Pharmacologic Agent - OS - Left Eye       Time Out 09/24/2023. 3:04 PM. Confirmed correct patient, procedure, site, and patient consented.   Anesthesia Topical anesthesia was used. Anesthetic medications included Lidocaine 2%, Proparacaine 0.5%.   Procedure Preparation included 5% betadine to ocular surface, eyelid speculum. A (32g) needle was used.   Injection: 6 mg faricimab-svoa 6 MG/0.05ML   Route: Intravitreal, Site: Left Eye   NDC: 09811-914-78, Lot: [id number], Expiration date: 02/08/2025, Waste: 0 mL   Post-op Post injection exam found visual acuity of at least counting fingers. The patient tolerated the procedure well. There were no complications. The patient received written and verbal post procedure care education.            ASSESSMENT/PLAN:    ICD-10-CM   1. Exudative age-related macular degeneration of left eye with active choroidal neovascularization (HCC)  H35.3221 OCT, Retina - OU - Both Eyes    Intravitreal Injection, Pharmacologic Agent - OS - Left Eye    faricimab-svoa (VABYSMO) 6mg /0.29mL intravitreal injection    2. Exudative age-related macular degeneration of right eye with active choroidal neovascularization (HCC)  H35.3211 Intravitreal Injection, Pharmacologic Agent -  OD - Right Eye    aflibercept (EYLEA) SOLN 2 mg    3.  Essential hypertension  I10     4. Hypertensive retinopathy of both eyes  H35.033     5. Pseudophakia, both eyes  Z96.1      1. Exudative age related macular degeneration, OS  - [facility] pt with known history of nonexudative ARMD OU  - acute "haze over vision" OS -- onset, Friday 11.12.21 -- initial exam with central subretinal heme  - s/p IVA OS #1 (11.17.21), #2 (12.15.21), #3 (01.12.22), #4 (02.09.22), #5 (03.16.22), #6 (04.20.22), #7 (06.01.22), #8 (06.29.22)--IVA resistance  - s/p IVE OS #1 (07.27.22), #2 (08.24.22), #3 (09.21.22), #4 (10.19.22), #5 (11.16.22), #6 (12.16.22), #7 (01.18.23), #8 (02.15.23)--IVE resistance  - s/p IVV OS #1 SAMPLE (03.15.23), #2 (04.12.23), #3 (05.10.23), #4 (06.07.23), #5 (07.19.23), #6 (08.22.23), #7 (09.27.23), #8 (12.20.23), #9 (01.31.24), #10 (03.13.24), #11 (04.24.24), #12 (05.31.24), #13 (07.09.24), #14 (08.21.24), #15 (10.02.24)  - BCVA stable at 20/20 OS   - exam and OCT show OS: Persistent SRF overlying stable PED -- slightly improved  - recommend IVV OS #16 today, 11.13.24 with follow up back to 5 weeks - pt wishes to be treated with IVV OS - RBA of procedure discussed, questions answered - IVV informed consent obtained and signed 03.15.23 (OS) - see procedure note   - f/u 5 weeks -- DFE/OCT, possible injection  2. Exudative age related macular degeneration OD  - conversion to exudative ARMD noted on 03.15.23 exam  - exam with new SRH temporal macula -- stably improved today  - s/p IVE OD #1 (03.15.23), #2 (04.12.23), #3 (05.10.23), #4 (06.07.23), #5 (07.19.23), #6 (08.19.23), #7 (09.27.23), #8 (11.01.23), #9 (03.11.24), #10 (07.09.24), #11 (08.21.24)  - BCVA 20/20 OD -- improved from 20/25  - OCT OD: Mild interval increase in shallow SRF centrally overlying stable low PED, no IRF at 6 weeks - recommend IVE OD #12 today, 10.02.24 w/ f/u back to 5 wks  - pt in agreement  - RBA of procedure discussed, questions answered - see procedure  note - IVE informed consent obtained and signed, 03.15.23 (OD)  - f/u 5 weeks -- DFE/OCT, possible injxn  3,4. Hypertensive retinopathy OU - discussed importance of tight BP control - monitor     5. Pseudophakia OU  - s/p CE/IOL OU  - IOLs in good position, doing well  - monitor   Ophthalmic Meds Ordered this visit:  Meds ordered this encounter  Medications   faricimab-svoa (VABYSMO) 6mg /0.66mL intravitreal injection   aflibercept (EYLEA) SOLN 2 mg     Return in about 5 weeks (around 10/29/2023) for f/u exu ARMD OU, DFE, OCT.  There are no Patient Instructions on file for this visit.  This document serves as a record of services personally performed by Karie Chimera, MD, PhD. It was created on their behalf by De Blanch, an ophthalmic technician. The creation of this record is the provider's dictation and/or activities during the visit.    Electronically signed by: De Blanch, OA, 09/24/23  11:36 PM  This document serves as a record of services personally performed by Karie Chimera, MD, PhD. It was created on their behalf by Glee Arvin. Manson Passey, OA an ophthalmic technician. The creation of this record is the provider's dictation and/or activities during the visit.    Electronically signed by: Glee Arvin. Manson Passey, OA 09/24/23 11:36 PM   Karie Chimera, M.D., Ph.D. Diseases & Surgery of the Retina and  Vitreous [facility]  I have reviewed the above documentation for accuracy and completeness, and I agree with the above. Karie Chimera, M.D., Ph.D. 09/24/23 11:37 PM  Abbreviations: M myopia (nearsighted); A astigmatism; H hyperopia (farsighted); P presbyopia; Mrx spectacle prescription;  CTL contact lenses; OD right eye; OS left eye; OU both eyes  XT exotropia; ET esotropia; PEK punctate epithelial keratitis; PEE punctate epithelial erosions; DES dry eye syndrome; MGD meibomian gland dysfunction; ATs artificial tears; PFAT's preservative free  artificial tears; NSC nuclear sclerotic cataract; PSC posterior subcapsular cataract; ERM epi-retinal membrane; PVD posterior vitreous detachment; RD retinal detachment; DM diabetes mellitus; DR diabetic retinopathy; NPDR non-proliferative diabetic retinopathy; PDR proliferative diabetic retinopathy; CSME clinically significant macular edema; DME diabetic macular edema; dbh dot blot hemorrhages; CWS cotton wool spot; POAG primary open angle glaucoma; C/D cup-to-disc ratio; HVF humphrey visual field; GVF goldmann visual field; OCT optical coherence tomography; IOP intraocular pressure; BRVO Branch retinal vein occlusion; CRVO central retinal vein occlusion; CRAO central retinal artery occlusion; BRAO branch retinal artery occlusion; RT retinal tear; SB scleral buckle; PPV pars plana vitrectomy; VH Vitreous hemorrhage; PRP panretinal laser photocoagulation; IVK intravitreal kenalog; VMT vitreomacular traction; MH Macular hole;  NVD neovascularization of the disc; NVE neovascularization elsewhere; AREDS age related eye disease study; ARMD age related macular degeneration; POAG primary open angle glaucoma; EBMD epithelial/anterior basement membrane dystrophy; ACIOL anterior chamber intraocular lens; IOL intraocular lens; PCIOL posterior chamber intraocular lens; Phaco/IOL phacoemulsification with intraocular lens placement; PRK photorefractive keratectomy; LASIK laser assisted in situ keratomileusis; HTN hypertension; DM diabetes mellitus; COPD chronic obstructive pulmonary disease

## 2023-09-19 ENCOUNTER — Other Ambulatory Visit: Payer: Self-pay

## 2023-09-24 ENCOUNTER — Other Ambulatory Visit: Payer: Self-pay

## 2023-09-24 ENCOUNTER — Ambulatory Visit (INDEPENDENT_AMBULATORY_CARE_PROVIDER_SITE_OTHER): Payer: Medicare Other | Admitting: Ophthalmology

## 2023-09-24 ENCOUNTER — Encounter (INDEPENDENT_AMBULATORY_CARE_PROVIDER_SITE_OTHER): Payer: Self-pay | Admitting: Ophthalmology

## 2023-09-24 DIAGNOSIS — Z961 Presence of intraocular lens: Secondary | ICD-10-CM | POA: Diagnosis not present

## 2023-09-24 DIAGNOSIS — H353221 Exudative age-related macular degeneration, left eye, with active choroidal neovascularization: Secondary | ICD-10-CM

## 2023-09-24 DIAGNOSIS — H35033 Hypertensive retinopathy, bilateral: Secondary | ICD-10-CM | POA: Diagnosis not present

## 2023-09-24 DIAGNOSIS — H353211 Exudative age-related macular degeneration, right eye, with active choroidal neovascularization: Secondary | ICD-10-CM

## 2023-09-24 DIAGNOSIS — I10 Essential (primary) hypertension: Secondary | ICD-10-CM

## 2023-09-24 DIAGNOSIS — H353231 Exudative age-related macular degeneration, bilateral, with active choroidal neovascularization: Secondary | ICD-10-CM

## 2023-09-24 MED ORDER — FARICIMAB-SVOA 6 MG/0.05ML IZ SOSY
6.0000 mg | PREFILLED_SYRINGE | INTRAVITREAL | Status: AC | PRN
Start: 2023-09-24 — End: 2023-09-24
  Administered 2023-09-24: 6 mg via INTRAVITREAL

## 2023-09-24 MED ORDER — AFLIBERCEPT 2MG/0.05ML IZ SOLN FOR KALEIDOSCOPE
2.0000 mg | INTRAVITREAL | Status: AC | PRN
Start: 2023-09-24 — End: 2023-09-24
  Administered 2023-09-24: 2 mg via INTRAVITREAL

## 2023-09-24 NOTE — Progress Notes (Signed)
Specialty Pharmacy Refill Coordination Note  Amanda [name] is a 77 y.o. female contacted today regarding refills of specialty medication(s) Acalabrutinib Maleate   Patient requested Delivery   Delivery date: 09/30/23   Verified address: [address]  [place]   Medication will be filled on 09/29/23.

## 2023-09-24 NOTE — Progress Notes (Signed)
Specialty Pharmacy Ongoing Clinical Assessment Note  Amanda [name] is a 77 y.o. female who is being followed by the specialty pharmacy service for RxSp Oncology   Patient's specialty medication(s) reviewed today: Acalabrutinib Maleate   Missed doses in the last 4 weeks: 0   Patient/Caregiver did not have any additional questions or concerns.   Therapeutic benefit summary: Patient is achieving benefit   Adverse events/side effects summary: No adverse events/side effects   Patient's therapy is appropriate to: Continue    Goals Addressed             This Visit's Progress    Slow Disease Progression       Patient is on track. Patient will maintain adherence. Per provider notes from 08/22/23, WBC count has improved significantly over the past 3 months (down to 34.7 on 10/11) and CT scans will be repeated once WBC is normal.          Follow up:  6 months  Otto Herb Specialty Pharmacist

## 2023-10-01 ENCOUNTER — Other Ambulatory Visit (HOSPITAL_COMMUNITY): Payer: Self-pay

## 2023-10-01 ENCOUNTER — Telehealth: Payer: Self-pay | Admitting: Pharmacy Technician

## 2023-10-01 NOTE — Telephone Encounter (Signed)
Oral Oncology Patient Advocate Encounter  Was successful in securing patient a $8,000 grant from [facility] to provide copayment coverage for Calquence.  This will keep the out of pocket expense at $0.     [employer] ID: [id number]  I have spoken with the patient.   The billing information is as follows and has been shared with [facility].    RxBin: [id number] PCN: [id number] Member ID: [id number] Group ID: [id number] Dates of Eligibility: 09/01/23 through 08/30/24  Fund:  CLL  Jinger Neighbors, CPhT-Adv Oncology Pharmacy Patient Advocate [facility] Direct Number: [phone]  Fax: [phone]

## 2023-10-02 DIAGNOSIS — C44519 Basal cell carcinoma of skin of other part of trunk: Secondary | ICD-10-CM | POA: Diagnosis not present

## 2023-10-02 DIAGNOSIS — M25562 Pain in left knee: Secondary | ICD-10-CM | POA: Diagnosis not present

## 2023-10-02 DIAGNOSIS — L905 Scar conditions and fibrosis of skin: Secondary | ICD-10-CM | POA: Diagnosis not present

## 2023-10-16 ENCOUNTER — Other Ambulatory Visit (HOSPITAL_COMMUNITY): Payer: Self-pay | Admitting: Pharmacy Technician

## 2023-10-16 ENCOUNTER — Other Ambulatory Visit (HOSPITAL_COMMUNITY): Payer: Self-pay

## 2023-10-16 ENCOUNTER — Other Ambulatory Visit: Payer: Self-pay

## 2023-10-16 ENCOUNTER — Other Ambulatory Visit: Payer: Self-pay | Admitting: Hematology and Oncology

## 2023-10-16 MED ORDER — ACYCLOVIR 400 MG PO TABS
400.0000 mg | ORAL_TABLET | Freq: Every day | ORAL | 6 refills | Status: DC
  Filled 2023-10-16: qty 30, 30d supply, fill #0
  Filled 2023-11-12: qty 30, 30d supply, fill #1
  Filled 2023-12-15: qty 30, 30d supply, fill #2
  Filled 2024-01-12 – 2024-01-15 (×2): qty 30, 30d supply, fill #3
  Filled 2024-02-09: qty 30, 30d supply, fill #4
  Filled 2024-03-10: qty 30, 30d supply, fill #5
  Filled 2024-04-12: qty 30, 30d supply, fill #6

## 2023-10-16 NOTE — Progress Notes (Signed)
Specialty Pharmacy Refill Coordination Note  Amanda [name] is a 77 y.o. female contacted today regarding refills of specialty medication(s) Acalabrutinib Maleate   Patient requested Delivery   Delivery date: 10/28/23   Verified address: [address] [name]   Medication will be filled on 10/27/23.

## 2023-10-23 NOTE — Progress Notes (Signed)
[facility] - Clinic Note  10/29/2023    CHIEF COMPLAINT Patient presents for Retina Follow Up  HISTORY OF PRESENT ILLNESS: Amanda [name] is a 77 y.o. female who presents to the clinic today for:  HPI     Retina Follow Up   Patient presents with  Wet AMD.  In both eyes.  This started 5 weeks ago.  I, the attending physician,  performed the HPI with the patient and updated documentation appropriately.        Comments   Patient here for 5 weeks retina follow up for exu ARMD OU. Patient states vision no problems except reading notices a change. No eye pain.      Last edited by Rennis Chris, MD on 10/29/2023  4:15 PM.    Pt states [facility] the same.   Referring physician: Alma Downs, PA-C [facility] [address] [place],  [place] [address]  HISTORICAL INFORMATION:  Selected notes from the MEDICAL RECORD NUMBER Referred by Alma Downs, PA on 11.16.21 for eval of dry to wet AMD conversion OS.   CURRENT MEDICATIONS: No current outpatient medications on file. (Ophthalmic Drugs)   No current facility-administered medications for this visit. (Ophthalmic Drugs)   Current Outpatient Medications (Other)  Medication Sig   acalabrutinib maleate (CALQUENCE) 100 MG tablet Take 1 tablet (100 mg total) by mouth 2 (two) times daily.   acetaminophen (TYLENOL) 500 MG tablet Take 500 mg by mouth every 6 (six) hours as needed for pain.   acyclovir (ZOVIRAX) 400 MG tablet Take 1 tablet (400 mg total) by mouth daily.   amLODipine (NORVASC) 5 MG tablet Take 5 mg by mouth daily.   atorvastatin (LIPITOR) 40 MG tablet Take 1 tablet by mouth daily.   Calcium Carb-Cholecalciferol (CALCIUM + D3) 600-200 MG-UNIT TABS Take 1 tablet by mouth 3 (three) times a week.    fluticasone (FLONASE) 50 MCG/ACT nasal spray Place 2 sprays into the nose daily.   folic acid (FOLVITE) 1 MG tablet Take 1 mg by mouth daily.   hydrochlorothiazide (HYDRODIURIL) 25 MG  tablet Take 25 mg by mouth daily.   Multiple Vitamin (MULTIVITAMIN) tablet Take 1 tablet by mouth daily.   Multiple Vitamins-Minerals (EYE VITAMINS & MINERALS) TABS Take by mouth.   potassium chloride SA (K-DUR,KLOR-CON) 20 MEQ tablet Take 20 mEq by mouth 3 (three) times a week. Mon, Wed, Fri   No current facility-administered medications for this visit. (Other)   REVIEW OF SYSTEMS: ROS   Positive for: Cardiovascular, Eyes Negative for: Constitutional, Gastrointestinal, Neurological, Skin, Genitourinary, Musculoskeletal, HENT, Endocrine, Respiratory, Psychiatric, Allergic/Imm, Heme/Lymph Last edited by Laddie Aquas, COA on 10/29/2023  1:36 PM.      ALLERGIES Allergies  Allergen Reactions   Codeine Nausea And Vomiting   Demerol [Meperidine] Nausea And Vomiting   Morphine And Codeine Nausea And Vomiting   Ciprofloxacin Rash   Sulfa Antibiotics Rash   PAST MEDICAL HISTORY Past Medical History:  Diagnosis Date   A-fib (HCC)    Adenopathy    Atrial fibrillation (HCC)    CLL (chronic lymphocytic leukemia) (HCC) 08/31/2013   HTN (hypertension)    Hyperlipidemia    Hypertensive retinopathy    Lymphocytosis    Macular degeneration    OU   Past Surgical History:  Procedure Laterality Date   ABDOMINAL HYSTERECTOMY     endometriosis, fibroid   APPENDECTOMY     BREAST BIOPSY     benign cyst.  CATARACT EXTRACTION Bilateral 2019   CHOLECYSTECTOMY     COLONOSCOPY  11/2012   Per Dr. Matthias Hughs.  neg.    EYE SURGERY     IR FLUORO GUIDE PORT INSERTION RIGHT  08/12/2017   IR REMOVAL TUN ACCESS W/ PORT W/O FL MOD SED  01/29/2018   IR US GUIDE VASC ACCESS RIGHT  08/12/2017   SQUAMOUS CELL CARCINOMA EXCISION  12/28/2020   Excised from top of head   THUMB ARTHROSCOPY     TOTAL HIP ARTHROPLASTY  2010   FAMILY HISTORY Family History  Problem Relation Age of Onset   Renal Disease Mother    Cancer Mother        colon cancer   Cancer Father        colon cancer   SOCIAL  HISTORY Social History   Tobacco Use   Smoking status: Never   Smokeless tobacco: Never  Vaping Use   Vaping status: Never Used  Substance Use Topics   Alcohol use: No   Drug use: No       OPHTHALMIC EXAM: Base Eye Exam     Visual Acuity (Snellen - Linear)       Right Left   Dist cc 20/20 20/20 -2    Correction: Glasses         Tonometry (Tonopen, 1:34 PM)       Right Left   Pressure 20 15         Pupils       Dark Light Shape React APD   Right 3 2 Round Brisk None   Left 3 2 Round Brisk None         Visual Fields (Counting fingers)       Left Right    Full Full         Extraocular Movement       Right Left    Full, Ortho Full, Ortho         Neuro/Psych     Oriented x3: Yes   Mood/Affect: Normal         Dilation     Both eyes: 1.0% Mydriacyl, 2.5% Phenylephrine @ 1:34 PM           Slit Lamp and Fundus Exam     Slit Lamp Exam       Right Left   Lids/Lashes Dermatochalasis - upper lid, mild Meibomian gland dysfunction Dermatochalasis - upper lid, mild Meibomian gland dysfunction   Conjunctiva/Sclera White and quiet White and quiet   Cornea Trace Debris in tear film, well healed temporal cataract wounds, 1+ pigmented Guttata, trace PEE well healed temporal cataract wounds, 2+ Punctate epithelial erosions   Anterior Chamber Deep and quiet Deep and quiet   Iris Round and dilated Round and dilated   Lens PC IOL in good position, trace PCO PC IOL in good position   Anterior Vitreous Trace Vitreous syneresis, Posterior vitreous detachment Trace Vitreous syneresis, Posterior vitreous detachment         Fundus Exam       Right Left   Disc Compact, trace pallor, Sharp rim, mild PPA Compact, Pink and Sharp, temporal PPP   C/D Ratio 0.1 0.2   Macula Blunted foveal reflex, Drusen, RPE mottling, clumping and early atrophy, shallow PED with trace SRF--IMPROVED Blunted foveal reflex, +CNV, Drusen, RPE mottling and clumping; trace  shallow SRF -- IMPROVED, no heme   Vessels attenuated, Tortuous attenuated, Tortuous   Periphery Attached; mild reticular degeneration, no heme Attached, reticular degeneration,  No heme           Refraction     Wearing Rx       Sphere Cylinder Axis Add   Right -0.25 +0.75 165 +2.50   Left -0.50 +0.50 012 +2.50    Type: PAL           IMAGING AND PROCEDURES  Imaging and Procedures for 10/29/2023  OCT, Retina - OU - Both Eyes       Right Eye Quality was good. Central Foveal Thickness: 282. Progression has improved. Findings include normal foveal contour, no IRF, no SRF, retinal drusen , subretinal hyper-reflective material, intraretinal hyper-reflective material, pigment epithelial detachment, outer retinal atrophy (Mild interval improvement in shallow SRF centrally overlying stable low PED, no IRF).   Left Eye Quality was good. Central Foveal Thickness: 292. Progression has improved. Findings include no IRF, no SRF, abnormal foveal contour, retinal drusen , subretinal hyper-reflective material, choroidal neovascular membrane, pigment epithelial detachment, outer retinal atrophy (Interval resolution of SRF overlying PED ; PED size slightly improved).   Notes *Images captured and stored on drive  Diagnosis / Impression:  OD: exu ARMD -- Mild interval improvement in shallow SRF centrally overlying stable low PED, no IRF OS: exu ARMD -- Interval resolution of SRF overlying PED ; PED size slightly improved  Clinical management:  See below  Abbreviations: NFP - Normal foveal profile. CME - cystoid macular edema. PED - pigment epithelial detachment. IRF - intraretinal fluid. SRF - subretinal fluid. EZ - ellipsoid zone. ERM - epiretinal membrane. ORA - outer retinal atrophy. ORT - outer retinal tubulation. SRHM - subretinal hyper-reflective material. IRHM - intraretinal hyper-reflective material      Intravitreal Injection, Pharmacologic Agent - OS - Left Eye       Time  Out 10/29/2023. 2:13 PM. Confirmed correct patient, procedure, site, and patient consented.   Anesthesia Topical anesthesia was used. Anesthetic medications included Lidocaine 2%, Proparacaine 0.5%.   Procedure Preparation included 5% betadine to ocular surface, eyelid speculum. A (32g) needle was used.   Injection: 6 mg faricimab-svoa 6 MG/0.05ML   Route: Intravitreal, Site: Left Eye   NDC: 16109-604-54, Lot: [id number], Expiration date: 09/10/2024, Waste: 0 mL   Post-op Post injection exam found visual acuity of at least counting fingers. The patient tolerated the procedure well. There were no complications. The patient received written and verbal post procedure care education.      Intravitreal Injection, Pharmacologic Agent - OD - Right Eye       Time Out 10/29/2023. 2:14 PM. Confirmed correct patient, procedure, site, and patient consented.   Anesthesia Topical anesthesia was used. Anesthetic medications included Lidocaine 2%, Proparacaine 0.5%.   Procedure Preparation included 5% betadine to ocular surface, eyelid speculum. A (32g) needle was used.   Injection: 2 mg aflibercept 2 MG/0.05ML   Route: Intravitreal, Site: Right Eye   NDC: L6038910, Lot: [id number], Expiration date: 03/10/2025, Waste: 0 mL   Post-op Post injection exam found visual acuity of at least counting fingers. The patient tolerated the procedure well. There were no complications. The patient received written and verbal post procedure care education. Post injection medications were not given.            ASSESSMENT/PLAN:    ICD-10-CM   1. Exudative age-related macular degeneration of left eye with active choroidal neovascularization (HCC)  H35.3221 OCT, Retina - OU - Both Eyes    Intravitreal Injection, Pharmacologic Agent - OS - Left Eye    faricimab-svoa (VABYSMO) 6mg /0.36mL intravitreal  injection    2. Exudative age-related macular degeneration of right eye with active choroidal  neovascularization (HCC)  H35.3211 OCT, Retina - OU - Both Eyes    Intravitreal Injection, Pharmacologic Agent - OD - Right Eye    aflibercept (EYLEA) SOLN 2 mg    3. Essential hypertension  I10     4. Hypertensive retinopathy of both eyes  H35.033     5. Pseudophakia, both eyes  Z96.1      1. Exudative age related macular degeneration, OS  - [facility] pt with known history of nonexudative ARMD OU  - acute "haze over vision" OS -- onset, Friday 11.12.21 -- initial exam with central subretinal heme  - s/p IVA OS #1 (11.17.21), #2 (12.15.21), #3 (01.12.22), #4 (02.09.22), #5 (03.16.22), #6 (04.20.22), #7 (06.01.22), #8 (06.29.22)--IVA resistance  - s/p IVE OS #1 (07.27.22), #2 (08.24.22), #3 (09.21.22), #4 (10.19.22), #5 (11.16.22), #6 (12.16.22), #7 (01.18.23), #8 (02.15.23)--IVE resistance  - s/p IVV OS #1 SAMPLE (03.15.23), #2 (04.12.23), #3 (05.10.23), #4 (06.07.23), #5 (07.19.23), #6 (08.22.23), #7 (09.27.23), #8 (12.20.23), #9 (01.31.24), #10 (03.13.24), #11 (04.24.24), #12 (05.31.24), #13 (07.09.24), #14 (08.21.24), #15 (10.02.24), #16 (11.13.24)  **h/o increased fluid at 6 wks on 11.13.24**  - BCVA stable at 20/20 OS   - exam and OCT show OS: Interval resolution of SRF overlying PED; PED size slightly improved at 5 wks  - recommend IVV OS #17 today, 12.18.24 with follow up in 5 weeks - pt wishes to be treated with IVV OS - RBA of procedure discussed, questions answered - IVV informed consent obtained and signed 03.15.23 (OS) - see procedure note   - f/u 5 weeks -- DFE/OCT, possible injection  2. Exudative age related macular degeneration OD  - conversion to exudative ARMD noted on 03.15.23 exam  - exam with new SRH temporal macula -- stably improved today  - s/p IVE OD #1 (03.15.23), #2 (04.12.23), #3 (05.10.23), #4 (06.07.23), #5 (07.19.23), #6 (08.19.23), #7 (09.27.23), #8 (11.01.23), #9 (03.11.24), #10 (07.09.24), #11 (08.21.24), #12 (10.02.24)  **h/o increased fluid at 6  wks on 11.13.24**  - BCVA 20/20 OD -- improved from 20/25  - OCT OD: Mild interval improvement in shallow SRF centrally overlying stable low PED, no IRF at 5 weeks - recommend IVE OD #13 today, 12.18.24 w/ f/u in 5 wks  - pt in agreement  - RBA of procedure discussed, questions answered - see procedure note - IVE informed consent obtained and signed, 03.15.23 (OD)  - f/u 5 weeks -- DFE/OCT, possible injxn  3,4. Hypertensive retinopathy OU - discussed importance of tight BP control - monitor     5. Pseudophakia OU  - s/p CE/IOL OU  - IOLs in good position, doing well  - monitor   Ophthalmic Meds Ordered this visit:  Meds ordered this encounter  Medications   faricimab-svoa (VABYSMO) 6mg /0.4mL intravitreal injection   aflibercept (EYLEA) SOLN 2 mg     Return in about 5 weeks (around 12/03/2023) for exu ARMD OU, DFE, OCT, likely injections OU.  There are no Patient Instructions on file for this visit.  This document serves as a record of services personally performed by Karie Chimera, MD, PhD. It was created on their behalf by De Blanch, an ophthalmic technician. The creation of this record is the provider's dictation and/or activities during the visit.    Electronically signed by: De Blanch, OA, 10/29/23  4:21 PM  Karie Chimera, M.D., Ph.D. Diseases & Surgery of the  Retina and Vitreous [facility]  I have reviewed the above documentation for accuracy and completeness, and I agree with the above. Karie Chimera, M.D., Ph.D. 10/29/23 4:21 PM  Abbreviations: M myopia (nearsighted); A astigmatism; H hyperopia (farsighted); P presbyopia; Mrx spectacle prescription;  CTL contact lenses; OD right eye; OS left eye; OU both eyes  XT exotropia; ET esotropia; PEK punctate epithelial keratitis; PEE punctate epithelial erosions; DES dry eye syndrome; MGD meibomian gland dysfunction; ATs artificial tears; PFAT's preservative free artificial tears;  NSC nuclear sclerotic cataract; PSC posterior subcapsular cataract; ERM epi-retinal membrane; PVD posterior vitreous detachment; RD retinal detachment; DM diabetes mellitus; DR diabetic retinopathy; NPDR non-proliferative diabetic retinopathy; PDR proliferative diabetic retinopathy; CSME clinically significant macular edema; DME diabetic macular edema; dbh dot blot hemorrhages; CWS cotton wool spot; POAG primary open angle glaucoma; C/D cup-to-disc ratio; HVF humphrey visual field; GVF goldmann visual field; OCT optical coherence tomography; IOP intraocular pressure; BRVO Branch retinal vein occlusion; CRVO central retinal vein occlusion; CRAO central retinal artery occlusion; BRAO branch retinal artery occlusion; RT retinal tear; SB scleral buckle; PPV pars plana vitrectomy; VH Vitreous hemorrhage; PRP panretinal laser photocoagulation; IVK intravitreal kenalog; VMT vitreomacular traction; MH Macular hole;  NVD neovascularization of the disc; NVE neovascularization elsewhere; AREDS age related eye disease study; ARMD age related macular degeneration; POAG primary open angle glaucoma; EBMD epithelial/anterior basement membrane dystrophy; ACIOL anterior chamber intraocular lens; IOL intraocular lens; PCIOL posterior chamber intraocular lens; Phaco/IOL phacoemulsification with intraocular lens placement; PRK photorefractive keratectomy; LASIK laser assisted in situ keratomileusis; HTN hypertension; DM diabetes mellitus; COPD chronic obstructive pulmonary disease

## 2023-10-27 ENCOUNTER — Other Ambulatory Visit: Payer: Self-pay

## 2023-10-29 ENCOUNTER — Encounter (INDEPENDENT_AMBULATORY_CARE_PROVIDER_SITE_OTHER): Payer: Self-pay | Admitting: Ophthalmology

## 2023-10-29 ENCOUNTER — Ambulatory Visit (INDEPENDENT_AMBULATORY_CARE_PROVIDER_SITE_OTHER): Payer: Medicare Other | Admitting: Ophthalmology

## 2023-10-29 DIAGNOSIS — Z961 Presence of intraocular lens: Secondary | ICD-10-CM

## 2023-10-29 DIAGNOSIS — H353221 Exudative age-related macular degeneration, left eye, with active choroidal neovascularization: Secondary | ICD-10-CM

## 2023-10-29 DIAGNOSIS — H353231 Exudative age-related macular degeneration, bilateral, with active choroidal neovascularization: Secondary | ICD-10-CM

## 2023-10-29 DIAGNOSIS — H35033 Hypertensive retinopathy, bilateral: Secondary | ICD-10-CM | POA: Diagnosis not present

## 2023-10-29 DIAGNOSIS — I10 Essential (primary) hypertension: Secondary | ICD-10-CM

## 2023-10-29 DIAGNOSIS — H353211 Exudative age-related macular degeneration, right eye, with active choroidal neovascularization: Secondary | ICD-10-CM

## 2023-10-29 MED ORDER — FARICIMAB-SVOA 6 MG/0.05ML IZ SOSY
6.0000 mg | PREFILLED_SYRINGE | INTRAVITREAL | Status: AC | PRN
Start: 2023-10-29 — End: 2023-10-29
  Administered 2023-10-29: 6 mg via INTRAVITREAL

## 2023-10-29 MED ORDER — AFLIBERCEPT 2MG/0.05ML IZ SOLN FOR KALEIDOSCOPE
2.0000 mg | INTRAVITREAL | Status: AC | PRN
  Administered 2023-10-29: 2 mg via INTRAVITREAL

## 2023-11-03 ENCOUNTER — Other Ambulatory Visit (HOSPITAL_COMMUNITY): Payer: Self-pay

## 2023-11-03 ENCOUNTER — Other Ambulatory Visit: Payer: Self-pay

## 2023-11-06 ENCOUNTER — Other Ambulatory Visit (HOSPITAL_COMMUNITY): Payer: Self-pay

## 2023-11-13 ENCOUNTER — Other Ambulatory Visit (HOSPITAL_COMMUNITY): Payer: Self-pay

## 2023-11-19 ENCOUNTER — Other Ambulatory Visit: Payer: Self-pay

## 2023-11-19 NOTE — Progress Notes (Signed)
 Specialty Pharmacy Refill Coordination Note  Amanda [name] is a 78 y.o. female contacted today regarding refills of specialty medication(s) Acalabrutinib  Maleate (Calquence )   Patient requested Delivery   Delivery date: 11/24/23   Verified address: [address]   [place] [address]   Medication will be filled on 11/21/23.

## 2023-11-20 ENCOUNTER — Other Ambulatory Visit: Payer: Self-pay

## 2023-11-20 ENCOUNTER — Telehealth: Payer: Self-pay

## 2023-11-20 NOTE — Progress Notes (Signed)
 Patient was contacted today regarding specialty medication, due to upcoming weather conditions medications will be filled 11/20/23 and will be delivered 11/21/23

## 2023-11-20 NOTE — Telephone Encounter (Signed)
 Called to see if she wanted to move appt tomorrow. She will keep appt as scheduled.

## 2023-11-21 ENCOUNTER — Inpatient Hospital Stay: Payer: Medicare Other | Attending: Hematology and Oncology

## 2023-11-21 ENCOUNTER — Inpatient Hospital Stay (HOSPITAL_BASED_OUTPATIENT_CLINIC_OR_DEPARTMENT_OTHER): Payer: Medicare Other | Admitting: Hematology and Oncology

## 2023-11-21 VITALS — BP 127/63 | HR 69 | Temp 97.8°F | Resp 18 | Ht 61.0 in | Wt 142.6 lb

## 2023-11-21 DIAGNOSIS — C9110 Chronic lymphocytic leukemia of B-cell type not having achieved remission: Secondary | ICD-10-CM

## 2023-11-21 DIAGNOSIS — Z79899 Other long term (current) drug therapy: Secondary | ICD-10-CM | POA: Insufficient documentation

## 2023-11-21 LAB — COMPREHENSIVE METABOLIC PANEL
ALT: 13 U/L (ref 0–44)
AST: 14 U/L — ABNORMAL LOW (ref 15–41)
Albumin: 4.3 g/dL (ref 3.5–5.0)
Alkaline Phosphatase: 67 U/L (ref 38–126)
Anion gap: 5 (ref 5–15)
BUN: 17 mg/dL (ref 8–23)
CO2: 32 mmol/L (ref 22–32)
Calcium: 9.8 mg/dL (ref 8.9–10.3)
Chloride: 104 mmol/L (ref 98–111)
Creatinine, Ser: 0.82 mg/dL (ref 0.44–1.00)
GFR, Estimated: >60 mL/min (ref >60)
Glucose, Bld: 90 mg/dL (ref 70–99)
Potassium: 3.8 mmol/L (ref 3.5–5.1)
Sodium: 141 mmol/L (ref 135–145)
Total Bilirubin: 0.7 mg/dL (ref 0.0–1.2)
Total Protein: 7 g/dL (ref 6.5–8.1)

## 2023-11-21 LAB — CBC WITH DIFFERENTIAL/PLATELET
Abs Immature Granulocytes: 0.09 10*3/uL — ABNORMAL HIGH (ref 0.00–0.07)
Basophils Absolute: 0.1 10*3/uL (ref 0.0–0.1)
Basophils Relative: 1 %
Eosinophils Absolute: 0.1 10*3/uL (ref 0.0–0.5)
Eosinophils Relative: 1 %
HCT: 43.2 % (ref 36.0–46.0)
Hemoglobin: 14.2 g/dL (ref 12.0–15.0)
Immature Granulocytes: 0 %
Lymphocytes Relative: 73 %
Lymphs Abs: 16.6 10*3/uL — ABNORMAL HIGH (ref 0.7–4.0)
MCH: 28.3 pg (ref 26.0–34.0)
MCHC: 32.9 g/dL (ref 30.0–36.0)
MCV: 86.1 fL (ref 80.0–100.0)
Monocytes Absolute: 0.6 10*3/uL (ref 0.1–1.0)
Monocytes Relative: 2 %
Neutro Abs: 5.3 10*3/uL (ref 1.7–7.7)
Neutrophils Relative %: 23 %
Platelets: 226 10*3/uL (ref 150–400)
RBC: 5.02 MIL/uL (ref 3.87–5.11)
RDW: 14.1 % (ref 11.5–15.5)
Smear Review: NORMAL
WBC: 22.8 10*3/uL — ABNORMAL HIGH (ref 4.0–10.5)
nRBC: 0 % (ref 0.0–0.2)

## 2023-11-23 ENCOUNTER — Encounter: Payer: Self-pay | Admitting: Hematology and Oncology

## 2023-11-23 NOTE — Progress Notes (Signed)
 [facility] OFFICE PROGRESS NOTE  Patient Care Team: Aisha Harvey, MD as PCP - General (Family Medicine) O'Neal, Darryle Ned, MD as PCP - Cardiology (Cardiology) Watt Rush, MD as Attending Physician (Urology) Blanca Elsie RAMAN, MD as Attending Physician (Cardiology) Lonn Hicks, MD as Consulting Physician (Hematology and Oncology)  ASSESSMENT & PLAN:  CLL (chronic lymphocytic leukemia) So far, she tolerated treatment well without side effects  I also recommend prophylactic antiviral with acyclovir   Lymphocytosis is resolving Clinically, she appears to be improving She has no palpable lymphadenopathy on exam I plan to space out her visits to every 3 months I will order repeat imaging study after she has resolution of lymphocytosis She desire potential knee surgery in the future We discussed the need to hold her treatment for 7 days leading to her surgery and resume the day after surgery to minimize risk of bleeding complications  No orders of the defined types were placed in this encounter.   All questions were answered. The patient knows to call the clinic with any problems, questions or concerns. The total time spent in the appointment was 20 minutes encounter with patients including review of chart and various tests results, discussions about plan of care and coordination of care plan   Hicks Lonn, MD 11/23/2023 12:58 PM  INTERVAL HISTORY: Please see below for problem oriented charting. she returns for surveillance follow-up on Calquence  for recurrent CLL She is doing well No recent bleeding complications or infection She desire surgery in the future  REVIEW OF SYSTEMS:   Constitutional: Denies fevers, chills or abnormal weight loss Eyes: Denies blurriness of vision Ears, nose, mouth, throat, and face: Denies mucositis or sore throat Respiratory: Denies cough, dyspnea or wheezes Cardiovascular: Denies palpitation, chest discomfort or lower extremity  swelling Gastrointestinal:  Denies nausea, heartburn or change in bowel habits Skin: Denies abnormal skin rashes Lymphatics: Denies new lymphadenopathy or easy bruising Neurological:Denies numbness, tingling or new weaknesses Behavioral/Psych: Mood is stable, no new changes  All other systems were reviewed with the patient and are negative.  I have reviewed the past medical history, past surgical history, social history and family history with the patient and they are unchanged from previous note.  ALLERGIES:  is allergic to codeine, demerol [meperidine], morphine and codeine, ciprofloxacin, and sulfa antibiotics.  MEDICATIONS:  Current Outpatient Medications  Medication Sig Dispense Refill   acalabrutinib  maleate (CALQUENCE ) 100 MG tablet Take 1 tablet (100 mg total) by mouth 2 (two) times daily. 60 tablet 11   acetaminophen  (TYLENOL ) 500 MG tablet Take 500 mg by mouth every 6 (six) hours as needed for pain.     acyclovir  (ZOVIRAX ) 400 MG tablet Take 1 tablet (400 mg total) by mouth daily. 30 tablet 6   amLODipine (NORVASC) 5 MG tablet Take 5 mg by mouth daily.     atorvastatin  (LIPITOR) 40 MG tablet Take 1 tablet by mouth daily.     Calcium  Carb-Cholecalciferol (CALCIUM  + D3) 600-200 MG-UNIT TABS Take 1 tablet by mouth 3 (three) times a week.      fluticasone  (FLONASE ) 50 MCG/ACT nasal spray Place 2 sprays into the nose daily.     folic acid (FOLVITE) 1 MG tablet Take 1 mg by mouth daily.     hydrochlorothiazide  (HYDRODIURIL ) 25 MG tablet Take 25 mg by mouth daily.     Multiple Vitamin (MULTIVITAMIN) tablet Take 1 tablet by mouth daily.     Multiple Vitamins-Minerals (EYE VITAMINS & MINERALS) TABS Take by mouth.  potassium chloride  SA (K-DUR,KLOR-CON ) 20 MEQ tablet Take 20 mEq by mouth 3 (three) times a week. Mon, Wed, Fri     No current facility-administered medications for this visit.    SUMMARY OF ONCOLOGIC HISTORY: Oncology History Overview Note  Del 13q   CLL (chronic  lymphocytic leukemia) (HCC)  02/09/2013 Pathology Results   Peripheral Blood Flow Cytometry - FINDINGS CONSISTENT WITH CHRONIC LYMPHOCYTIC LEUKEMIA   09/02/2013 Pathology Results   FISH positive for deletion 13 q   05/22/2017 Imaging   Worsening mesenteric and retroperitoneal adenopathy concerning for worsening lymphoproliferative disorder/lymphoma.   Small cysts in the liver and kidneys are stable.   Prior cholecystectomy.   Aortic atherosclerosis.   Left colonic diverticulosis without active diverticulitis.   08/11/2017 Imaging   Chest Impression:  1. Mild mediastinal and hilar lymphadenopathy. 2. Clusters small axial lymph nodes.  Abdomen / Pelvis Impression:  1. Mild periaortic retroperitoneal adenopathy and moderate central mesenteric adenopathy not changed comparison CT. 2. Mild iliac adenopathy 3. Normal volume spleen. 4. No explanation for RIGHT lower quadrant pain. Cluster of lymph nodes in the ileocecal mesenteries similar to prior.   08/12/2017 Procedure   Placement of single lumen port a cath via right internal jugular vein. The catheter tip lies at the cavo-atrial junction. A power injectable port a cath was placed and is ready for immediate use.   08/14/2017 - 11/14/2017 Chemotherapy   She received Bendamustine  and Rituxan    11/12/2017 Imaging   1. Continued decrease in mediastinal and hilar lymph nodes with interval resolution of the abdominal and pelvic lymphadenopathy seen on the prior study. There is no lymphadenopathy in the chest, abdomen, or pelvis by CT size criteria on today's study. No new or progressive interval findings. 2.  Aortic Atherosclerois (ICD10-170.0) 3. Hepatic and renal cysts, stable.   12/18/2017 Adverse Reaction   We are unable to resume cycle 5 of chemotherapy due to prolonged pancytopenia   01/29/2018 Procedure   Successful right IJ vein Port-A-Cath explant.   02/25/2023 Imaging   CT CHEST ABDOMEN PELVIS W CONTRAST  Result Date:  02/25/2023 CLINICAL DATA:  78 year old female with history of CLL originally diagnosed in 2019. Increasing white blood cell count and lymphadenopathy. * Tracking Code: BO * EXAM: CT CHEST, ABDOMEN, AND PELVIS WITH CONTRAST TECHNIQUE: Multidetector CT imaging of the chest, abdomen and pelvis was performed following the standard protocol during bolus administration of intravenous contrast. RADIATION DOSE REDUCTION: This exam was performed according to the departmental dose-optimization program which includes automated exposure control, adjustment of the mA and/or kV according to patient size and/or use of iterative reconstruction technique. CONTRAST:  OMNIPAQUE  IOHEXOL  300 MG/ML  SOLN COMPARISON:  CT of the chest, abdomen and pelvis 11/12/2017. FINDINGS: CT CHEST FINDINGS Cardiovascular: Heart size is normal. There is no significant pericardial fluid, thickening or pericardial calcification. Aortic atherosclerosis. No definite coronary artery calcifications. Mediastinum/Nodes: Multiple enlarged and borderline enlarged mediastinal and bilateral hilar lymph nodes are noted measuring up to 1.6 cm in short axis in the low right paratracheal nodal station and 1.4 cm in short axis in the right hilar region. Multiple enlarged and borderline enlarged bilateral axillary and subpectoral lymph nodes are also noted measuring up to 1 cm in the left axillary region (axial image 12 of series 2). Esophagus is unremarkable in appearance. Lungs/Pleura: 3 mm right lower lobe pulmonary nodule (axial image 110 of series 4). No other larger more suspicious appearing pulmonary nodules or masses are noted. No acute consolidative airspace disease. No  pleural effusions. Musculoskeletal: There are no aggressive appearing lytic or blastic lesions noted in the visualized portions of the skeleton. CT ABDOMEN PELVIS FINDINGS Hepatobiliary: Multiple small well-defined low-attenuation lesions scattered throughout the hepatic parenchyma. The  largest of these are compatible with cysts, measuring up to 1.4 cm in diameter. The smaller lesions are too small to definitively characterize, but also favored represent small cysts and/or biliary hamartomas. Less well-defined areas of low attenuation are also noted, particularly adjacent to the gallbladder fossa, where the largest of these measures 2.0 x 1.5 cm (axial image 61 of series 2), slightly more pronounced than the prior examination from 2019, incompletely characterize, but favored to represent focal fatty infiltration. Status post cholecystectomy. No intrahepatic biliary ductal dilatation. Common bile duct measures 1.3 cm in the porta hepatis, likely reflective of benign post cholecystectomy physiology. Pancreas: No pancreatic mass. No pancreatic ductal dilatation. No pancreatic or peripancreatic fluid collections or inflammatory changes. Spleen: Spleen is enlarged measuring 12.4 x 6.2 x 10.4 cm (estimated splenic volume of 400 mL) . Adrenals/Urinary Tract: Subcentimeter low-attenuation lesions in both kidneys, too small to definitively characterize, but statistically likely to represent tiny cysts (no imaging follow-up recommended). No hydroureteronephrosis. Urinary bladder is normal in appearance. Bilateral adrenal glands are normal in appearance. Stomach/Bowel: Normal appearance of the stomach. No pathologic dilatation of small bowel or colon. Numerous colonic diverticula are noted, without surrounding inflammatory changes to suggest an acute diverticulitis at this time. Vascular/Lymphatic: Atherosclerosis in the abdominal aorta and pelvic vasculature, without evidence of aneurysm or dissection. Extensive lymphadenopathy is noted in the abdomen and pelvis, predominantly in the small bowel mesentery where the largest lymph nodes measure up to 2.2 cm in short axis (axial image 76 of series 2). Multiple prominent borderline enlarged and mildly enlarged retroperitoneal lymph nodes are also noted measuring  up to 1.4 cm in short axis in the low left para-aortic nodal station (axial image 75 of series 2). The largest pelvic lymph nodes are in the left obturator nodal station (axial image 96 of series 2) measuring 1.3 cm in short axis. Reproductive: Status post hysterectomy. Ovaries are not confidently identified may be surgically absent or atrophic. Other: No significant volume of ascites.  No pneumoperitoneum. Musculoskeletal: Status post right hip hip arthroplasty. There are no aggressive appearing lytic or blastic lesions noted in the visualized portions of the skeleton. IMPRESSION: 1. Extensive lymphadenopathy throughout the chest, abdomen and pelvis, as above, compatible with recurrent disease. 2. Mild splenomegaly. 3. Poorly defined low-attenuation regions in the liver, most evident in segment 5 adjacent to the gallbladder fossa, favored to represent areas of focal fatty infiltration. Attention on follow-up imaging is recommended to ensure stability. Alternatively, definitive characterization could be obtained with MRI of the abdomen with and without IV gadolinium if of clinical concern. 4. 3 mm right lower lobe pulmonary nodule, nonspecific, but statistically likely benign. Attention at time of routine imaging follow-up is recommended to ensure stability or regression. 5. Aortic atherosclerosis. 6. Additional incidental imaging findings, as above. Electronically Signed   By: Toribio Aye M.D.   On: 02/25/2023 08:44   MM 3D SCREENING MAMMOGRAM BILATERAL BREAST  Result Date: 02/18/2023 CLINICAL DATA:  Screening. EXAM: DIGITAL SCREENING BILATERAL MAMMOGRAM WITH TOMOSYNTHESIS AND CAD TECHNIQUE: Bilateral screening digital craniocaudal and mediolateral oblique mammograms were obtained. Bilateral screening digital breast tomosynthesis was performed. The images were evaluated with computer-aided detection. COMPARISON:  Previous exam(s). ACR Breast Density Category b: There are scattered areas of fibroglandular  density. FINDINGS: There are no  findings suspicious for malignancy. IMPRESSION: No mammographic evidence of malignancy. A result letter of this screening mammogram will be mailed directly to the patient. RECOMMENDATION: Screening mammogram in one year. (Code:[id number]) BI-RADS CATEGORY  1: Negative. Electronically Signed   By: [name] Farr M.D.   On: 02/18/2023 11:26      03/21/2023 -  Chemotherapy   She started on Calquence      PHYSICAL EXAMINATION: ECOG PERFORMANCE STATUS: 0 - Asymptomatic  Vitals:   11/21/23 0946  BP: 127/63  Pulse: 69  Resp: 18  Temp: 97.8 F (36.6 C)  SpO2: 98%   Filed Weights   11/21/23 0946  Weight: 142 lb 9.6 oz (64.7 kg)    GENERAL:alert, no distress and comfortable  LABORATORY DATA:  I have reviewed the data as listed    Component Value Date/Time   NA 141 11/21/2023 0926   NA 141 11/13/2017 0753   K 3.8 11/21/2023 0926   K 4.1 11/13/2017 0753   CL 104 11/21/2023 0926   CL 104 02/08/2013 1454   CO2 32 11/21/2023 0926   CO2 27 11/13/2017 0753   GLUCOSE 90 11/21/2023 0926   GLUCOSE 113 11/13/2017 0753   GLUCOSE 103 (H) 02/08/2013 1454   BUN 17 11/21/2023 0926   BUN 11.6 11/13/2017 0753   CREATININE 0.82 11/21/2023 0926   CREATININE 0.84 04/10/2023 1128   CREATININE 0.8 11/13/2017 0753   CALCIUM  9.8 11/21/2023 0926   CALCIUM  9.3 11/13/2017 0753   PROT 7.0 11/21/2023 0926   PROT 6.2 (L) 11/13/2017 0753   ALBUMIN 4.3 11/21/2023 0926   ALBUMIN 3.3 (L) 11/13/2017 0753   AST 14 (L) 11/21/2023 0926   AST 16 04/10/2023 1128   AST 24 11/13/2017 0753   ALT 13 11/21/2023 0926   ALT 14 04/10/2023 1128   ALT 24 11/13/2017 0753   ALKPHOS 67 11/21/2023 0926   ALKPHOS 83 11/13/2017 0753   BILITOT 0.7 11/21/2023 0926   BILITOT 0.7 04/10/2023 1128   BILITOT 0.38 11/13/2017 0753   GFRNONAA >60 11/21/2023 0926   GFRNONAA >60 04/10/2023 1128   GFRAA >60 01/12/2019 0938    No results found for: SPEP, UPEP  Lab Results  Component Value Date    WBC 22.8 (H) 11/21/2023   NEUTROABS 5.3 11/21/2023   HGB 14.2 11/21/2023   HCT 43.2 11/21/2023   MCV 86.1 11/21/2023   PLT 226 11/21/2023      Chemistry      Component Value Date/Time   NA 141 11/21/2023 0926   NA 141 11/13/2017 0753   K 3.8 11/21/2023 0926   K 4.1 11/13/2017 0753   CL 104 11/21/2023 0926   CL 104 02/08/2013 1454   CO2 32 11/21/2023 0926   CO2 27 11/13/2017 0753   BUN 17 11/21/2023 0926   BUN 11.6 11/13/2017 0753   CREATININE 0.82 11/21/2023 0926   CREATININE 0.84 04/10/2023 1128   CREATININE 0.8 11/13/2017 0753      Component Value Date/Time   CALCIUM  9.8 11/21/2023 0926   CALCIUM  9.3 11/13/2017 0753   ALKPHOS 67 11/21/2023 0926   ALKPHOS 83 11/13/2017 0753   AST 14 (L) 11/21/2023 0926   AST 16 04/10/2023 1128   AST 24 11/13/2017 0753   ALT 13 11/21/2023 0926   ALT 14 04/10/2023 1128   ALT 24 11/13/2017 0753   BILITOT 0.7 11/21/2023 0926   BILITOT 0.7 04/10/2023 1128   BILITOT 0.38 11/13/2017 0753

## 2023-11-23 NOTE — Assessment & Plan Note (Signed)
 So far, she tolerated treatment well without side effects  I also recommend prophylactic antiviral with acyclovir   Lymphocytosis is resolving Clinically, she appears to be improving She has no palpable lymphadenopathy on exam I plan to space out her visits to every 3 months I will order repeat imaging study after she has resolution of lymphocytosis She desire potential knee surgery in the future We discussed the need to hold her treatment for 7 days leading to her surgery and resume the day after surgery to minimize risk of bleeding complications

## 2023-11-27 DIAGNOSIS — M1712 Unilateral primary osteoarthritis, left knee: Secondary | ICD-10-CM | POA: Diagnosis not present

## 2023-11-27 NOTE — Progress Notes (Signed)
[facility] - Clinic Note  12/03/2023    CHIEF COMPLAINT Patient presents for Retina Follow Up  HISTORY OF PRESENT ILLNESS: Amanda [name] is a 78 y.o. female who presents to the clinic today for:  HPI     Retina Follow Up   Patient presents with  Wet AMD.  In both eyes.  This started 5 weeks ago.  Duration of 5 weeks.  Since onset it is stable.  I, the attending physician,  performed the HPI with the patient and updated documentation appropriately.        Comments   5 week retina follow up ARMD OU I'VE OD and IVV OS pt is reporting no vision changes noticed she denies any flashes has some floaters       Last edited by Rennis Chris, MD on 12/03/2023  5:41 PM.    Patient states the eyes are feeling dry.   Referring physician: Alma Downs, PA-C [facility] [address] [place],  [place] [address]  HISTORICAL INFORMATION:  Selected notes from the MEDICAL RECORD NUMBER Referred by Alma Downs, PA on 11.16.21 for eval of dry to wet AMD conversion OS.   CURRENT MEDICATIONS: No current outpatient medications on file. (Ophthalmic Drugs)   No current facility-administered medications for this visit. (Ophthalmic Drugs)   Current Outpatient Medications (Other)  Medication Sig   acalabrutinib maleate (CALQUENCE) 100 MG tablet Take 1 tablet (100 mg total) by mouth 2 (two) times daily.   acetaminophen (TYLENOL) 500 MG tablet Take 500 mg by mouth every 6 (six) hours as needed for pain.   acyclovir (ZOVIRAX) 400 MG tablet Take 1 tablet (400 mg total) by mouth daily.   amLODipine (NORVASC) 5 MG tablet Take 5 mg by mouth daily.   atorvastatin (LIPITOR) 40 MG tablet Take 1 tablet by mouth daily.   Calcium Carb-Cholecalciferol (CALCIUM + D3) 600-200 MG-UNIT TABS Take 1 tablet by mouth 3 (three) times a week.    fluticasone (FLONASE) 50 MCG/ACT nasal spray Place 2 sprays into the nose daily.   folic acid (FOLVITE) 1 MG tablet Take 1 mg  by mouth daily.   hydrochlorothiazide (HYDRODIURIL) 25 MG tablet Take 25 mg by mouth daily.   Multiple Vitamin (MULTIVITAMIN) tablet Take 1 tablet by mouth daily.   Multiple Vitamins-Minerals (EYE VITAMINS & MINERALS) TABS Take by mouth.   potassium chloride SA (K-DUR,KLOR-CON) 20 MEQ tablet Take 20 mEq by mouth 3 (three) times a week. Mon, Wed, Fri   No current facility-administered medications for this visit. (Other)   REVIEW OF SYSTEMS: ROS   Positive for: Cardiovascular, Eyes Negative for: Constitutional, Gastrointestinal, Neurological, Skin, Genitourinary, Musculoskeletal, HENT, Endocrine, Respiratory, Psychiatric, Allergic/Imm, Heme/Lymph Last edited by Etheleen Mayhew, COT on 12/03/2023  1:44 PM.     ALLERGIES Allergies  Allergen Reactions   Codeine Nausea And Vomiting   Demerol [Meperidine] Nausea And Vomiting   Morphine And Codeine Nausea And Vomiting   Ciprofloxacin Rash   Sulfa Antibiotics Rash   PAST MEDICAL HISTORY Past Medical History:  Diagnosis Date   A-fib [facility])    Adenopathy    Atrial fibrillation (HCC)    CLL (chronic lymphocytic leukemia) (HCC) 08/31/2013   HTN (hypertension)    Hyperlipidemia    Hypertensive retinopathy    Lymphocytosis    Macular degeneration    OU   Past Surgical History:  Procedure Laterality Date   ABDOMINAL HYSTERECTOMY     endometriosis, fibroid   APPENDECTOMY  BREAST BIOPSY     benign cyst.   CATARACT EXTRACTION Bilateral 2019   CHOLECYSTECTOMY     COLONOSCOPY  11/2012   Per Dr. Matthias Hughs.  neg.    EYE SURGERY     IR FLUORO GUIDE PORT INSERTION RIGHT  08/12/2017   IR REMOVAL TUN ACCESS W/ PORT W/O FL MOD SED  01/29/2018   IR US GUIDE VASC ACCESS RIGHT  08/12/2017   SQUAMOUS CELL CARCINOMA EXCISION  12/28/2020   Excised from top of head   THUMB ARTHROSCOPY     TOTAL HIP ARTHROPLASTY  2010   FAMILY HISTORY Family History  Problem Relation Age of Onset   Renal Disease Mother    Cancer Mother        colon  cancer   Cancer Father        colon cancer   SOCIAL HISTORY Social History   Tobacco Use   Smoking status: Never   Smokeless tobacco: Never  Vaping Use   Vaping status: Never Used  Substance Use Topics   Alcohol use: No   Drug use: No       OPHTHALMIC EXAM: Base Eye Exam     Visual Acuity (Snellen - Linear)       Right Left   Dist cc 20/20 20/20 -2         Tonometry (Tonopen, 1:48 PM)       Right Left   Pressure 13 14         Pupils       Pupils Dark Light Shape React APD   Right PERRL 3 2 Round Brisk None   Left PERRL 3 2 Round Brisk None         Visual Fields       Left Right    Full Full         Extraocular Movement       Right Left    Full, Ortho Full, Ortho         Neuro/Psych     Oriented x3: Yes   Mood/Affect: Normal         Dilation     Both eyes: 2.5% Phenylephrine @ 1:48 PM           Slit Lamp and Fundus Exam     Slit Lamp Exam       Right Left   Lids/Lashes Dermatochalasis - upper lid, mild Meibomian gland dysfunction Dermatochalasis - upper lid, mild Meibomian gland dysfunction   Conjunctiva/Sclera White and quiet White and quiet   Cornea Trace Debris in tear film, well healed temporal cataract wounds, 1+ pigmented Guttata, trace PEE well healed temporal cataract wounds, 2+ Punctate epithelial erosions   Anterior Chamber Deep and quiet Deep and quiet   Iris Round and dilated Round and dilated   Lens PC IOL in good position, trace PCO PC IOL in good position   Anterior Vitreous Trace Vitreous syneresis, Posterior vitreous detachment Trace Vitreous syneresis, Posterior vitreous detachment         Fundus Exam       Right Left   Disc Compact, trace pallor, Sharp rim, mild PPA Compact, Pink and Sharp, temporal PPP   C/D Ratio 0.1 0.2   Macula Blunted foveal reflex, Drusen, RPE mottling, clumping and early atrophy, shallow PED with trace SRF--stably improved Blunted foveal reflex, +CNV, Drusen, RPE mottling and  clumping; trace shallow SRF -- stably improved, no heme   Vessels attenuated, Tortuous attenuated, Tortuous   Periphery Attached; mild reticular  degeneration, no heme Attached, reticular degeneration, No heme           Refraction     Wearing Rx       Sphere Cylinder Axis Add   Right -0.25 +0.75 165 +2.50   Left -0.50 +0.50 012 +2.50    Type: PAL           IMAGING AND PROCEDURES  Imaging and Procedures for 12/03/2023  OCT, Retina - OU - Both Eyes       Right Eye Quality was good. Central Foveal Thickness: 258. Progression has improved. Findings include normal foveal contour, no IRF, no SRF, retinal drusen , subretinal hyper-reflective material, intraretinal hyper-reflective material, pigment epithelial detachment, outer retinal atrophy (Mild interval improvement in shallow SRF centrally overlying stable low PED, no IRF).   Left Eye Quality was good. Central Foveal Thickness: 286. Progression has improved. Findings include no IRF, no SRF, abnormal foveal contour, retinal drusen , subretinal hyper-reflective material, choroidal neovascular membrane, pigment epithelial detachment, outer retinal atrophy (Stable resolution of SRF overlying PED; PED size slightly improved).   Notes *Images captured and stored on drive  Diagnosis / Impression:  OD: exu ARMD -- Mild interval improvement in shallow SRF centrally overlying stable low PED, no IRF OS: exu ARMD -- stable resolution of SRF overlying PED ; PED size slightly improved  Clinical management:  See below  Abbreviations: NFP - Normal foveal profile. CME - cystoid macular edema. PED - pigment epithelial detachment. IRF - intraretinal fluid. SRF - subretinal fluid. EZ - ellipsoid zone. ERM - epiretinal membrane. ORA - outer retinal atrophy. ORT - outer retinal tubulation. SRHM - subretinal hyper-reflective material. IRHM - intraretinal hyper-reflective material      Intravitreal Injection, Pharmacologic Agent - OD - Right  Eye       Time Out 12/03/2023. 2:51 PM. Confirmed correct patient, procedure, site, and patient consented.   Anesthesia Topical anesthesia was used. Anesthetic medications included Lidocaine 2%, Proparacaine 0.5%.   Procedure Preparation included 5% betadine to ocular surface, eyelid speculum. A (32g) needle was used.   Injection: 6 mg faricimab-svoa 6 MG/0.05ML   Route: Intravitreal, Site: Right Eye   NDC: R2083049, Lot: [id number], Expiration date: 03/10/2025, Waste: 0 mL   Post-op Post injection exam found visual acuity of at least counting fingers. The patient tolerated the procedure well. There were no complications. The patient received written and verbal post procedure care education. Post injection medications were not given.      Intravitreal Injection, Pharmacologic Agent - OS - Left Eye       Time Out 12/03/2023. 2:52 PM. Confirmed correct patient, procedure, site, and patient consented.   Anesthesia Topical anesthesia was used. Anesthetic medications included Lidocaine 2%, Proparacaine 0.5%.   Procedure Preparation included 5% betadine to ocular surface, eyelid speculum. A (32g) needle was used.   Injection: 6 mg faricimab-svoa 6 MG/0.05ML   Route: Intravitreal, Site: Left Eye   NDC: 08657-846-96, Lot: [id number], Expiration date: 03/10/2025, Waste: 0 mL   Post-op Post injection exam found visual acuity of at least counting fingers. The patient tolerated the procedure well. There were no complications. The patient received written and verbal post procedure care education.            ASSESSMENT/PLAN:    ICD-10-CM   1. Exudative age-related macular degeneration of left eye with active choroidal neovascularization (HCC)  H35.3221 OCT, Retina - OU - Both Eyes    Intravitreal Injection, Pharmacologic Agent - OS - Left Eye  faricimab-svoa (VABYSMO) 6mg /0.63mL intravitreal injection    2. Exudative age-related macular degeneration of right eye with active  choroidal neovascularization (HCC)  H35.3211 OCT, Retina - OU - Both Eyes    Intravitreal Injection, Pharmacologic Agent - OD - Right Eye    faricimab-svoa (VABYSMO) 6mg /0.33mL intravitreal injection    3. Essential hypertension  I10     4. Hypertensive retinopathy of both eyes  H35.033     5. Pseudophakia, both eyes  Z96.1      1. Exudative age related macular degeneration, OS  - [facility] pt with known history of nonexudative ARMD OU - acute "haze over vision" OS -- onset, Friday 11.12.21 -- initial exam with central subretinal heme - s/p IVA OS #1 (11.17.21), #2 (12.15.21), #3 (01.12.22), #4 (02.09.22), #5 (03.16.22), #6 (04.20.22), #7 (06.01.22), #8 (06.29.22)--IVA resistance ========================= - s/p IVE OS #1 (07.27.22), #2 (08.24.22), #3 (09.21.22), #4 (10.19.22), #5 (11.16.22), #6 (12.16.22), #7 (01.18.23), #8 (02.15.23)--IVE resistance ========================= - s/p IVV OS #1 SAMPLE (03.15.23), #2 (04.12.23), #3 (05.10.23), #4 (06.07.23), #5 (07.19.23), #6 (08.22.23), #7 (09.27.23), #8 (12.20.23), #9 (01.31.24), #10 (03.13.24), #11 (04.24.24), #12 (05.31.24), #13 (07.09.24), #14 (08.21.24), #15 (10.02.24), #16 (11.13.24), #17 (12.18.24)  **h/o increased fluid at 6 wks on 11.13.24**  - BCVA stable at 20/20 OS - stable - exam and OCT show OS: stable resolution of SRF overlying PED; PED size slightly improved at 5 wks  - recommend IVV OS #18 today, 01.22.25 with follow up in 5 weeks - pt wishes to be treated with IVV OS - RBA of procedure discussed, questions answered - IVV informed consent obtained and signed 01.22.25 (OS) - see procedure note   - f/u 5 weeks -- DFE/OCT, possible injections  2. Exudative age related macular degeneration OD  - conversion to exudative ARMD noted on 03.15.23 exam  - exam with new SRH temporal macula -- stably improved today - s/p IVE OD #1 (03.15.23), #2 (04.12.23), #3 (05.10.23), #4 (06.07.23), #5 (07.19.23), #6 (08.19.23), #7  (09.27.23), #8 (11.01.23), #9 (03.11.24), #10 (07.09.24), #11 (08.21.24), #12 (10.02.24), #13 (12.18.24)  **h/o increased fluid at 6 wks on 11.13.24**  - BCVA OD 20/20 -- stable - OCT OD: Mild interval improvement in shallow SRF centrally overlying stable low PED, no IRF at 5 weeks **discussed decreased efficacy / resistance to Eylea and potential benefit of switching medication**  - recommend switching to IVV OD #1 today, 01.22.25 w/ f/u in 5 wks  - pt in agreement  - RBA of procedure discussed, questions answered - see procedure note - IVE informed consent obtained and signed, 01.22.25 (OD) - IVV informed consent obtained and signed , 01.25.25 (OU)  - f/u 5 weeks -- DFE/OCT, possible injxns  3,4. Hypertensive retinopathy OU - discussed importance of tight BP control - monitor     5. Pseudophakia OU  - s/p CE/IOL OU  - IOLs in good position, doing well  - monitor   Ophthalmic Meds Ordered this visit:  Meds ordered this encounter  Medications   faricimab-svoa (VABYSMO) 6mg /0.4mL intravitreal injection   faricimab-svoa (VABYSMO) 6mg /0.13mL intravitreal injection     Return in about 5 weeks (around 01/07/2024) for f/u Ex. AMD OU, DFE, OCT, Possible, IVV, OU.  There are no Patient Instructions on file for this visit.  This document serves as a record of services personally performed by Karie Chimera, MD, PhD. It was created on their behalf by Glee Arvin. Manson Passey, OA an ophthalmic technician. The creation of this record is the  provider's dictation and/or activities during the visit.    Electronically signed by: Glee Arvin. Manson Passey, OA 12/05/23 5:03 PM  This document serves as a record of services personally performed by Karie Chimera, MD, PhD. It was created on their behalf by Charlette Caffey, COT an ophthalmic technician. The creation of this record is the provider's dictation and/or activities during the visit.    Electronically signed by:  Charlette Caffey, COT  12/05/23 5:03  PM  Karie Chimera, M.D., Ph.D. Diseases & Surgery of the Retina and Vitreous [facility]  I have reviewed the above documentation for accuracy and completeness, and I agree with the above. Karie Chimera, M.D., Ph.D. 12/05/23 5:05 PM   Abbreviations: M myopia (nearsighted); A astigmatism; H hyperopia (farsighted); P presbyopia; Mrx spectacle prescription;  CTL contact lenses; OD right eye; OS left eye; OU both eyes  XT exotropia; ET esotropia; PEK punctate epithelial keratitis; PEE punctate epithelial erosions; DES dry eye syndrome; MGD meibomian gland dysfunction; ATs artificial tears; PFAT's preservative free artificial tears; NSC nuclear sclerotic cataract; PSC posterior subcapsular cataract; ERM epi-retinal membrane; PVD posterior vitreous detachment; RD retinal detachment; DM diabetes mellitus; DR diabetic retinopathy; NPDR non-proliferative diabetic retinopathy; PDR proliferative diabetic retinopathy; CSME clinically significant macular edema; DME diabetic macular edema; dbh dot blot hemorrhages; CWS cotton wool spot; POAG primary open angle glaucoma; C/D cup-to-disc ratio; HVF humphrey visual field; GVF goldmann visual field; OCT optical coherence tomography; IOP intraocular pressure; BRVO Branch retinal vein occlusion; CRVO central retinal vein occlusion; CRAO central retinal artery occlusion; BRAO branch retinal artery occlusion; RT retinal tear; SB scleral buckle; PPV pars plana vitrectomy; VH Vitreous hemorrhage; PRP panretinal laser photocoagulation; IVK intravitreal kenalog; VMT vitreomacular traction; MH Macular hole;  NVD neovascularization of the disc; NVE neovascularization elsewhere; AREDS age related eye disease study; ARMD age related macular degeneration; POAG primary open angle glaucoma; EBMD epithelial/anterior basement membrane dystrophy; ACIOL anterior chamber intraocular lens; IOL intraocular lens; PCIOL posterior chamber intraocular lens; Phaco/IOL  phacoemulsification with intraocular lens placement; PRK photorefractive keratectomy; LASIK laser assisted in situ keratomileusis; HTN hypertension; DM diabetes mellitus; COPD chronic obstructive pulmonary disease

## 2023-12-03 ENCOUNTER — Ambulatory Visit (INDEPENDENT_AMBULATORY_CARE_PROVIDER_SITE_OTHER): Payer: Medicare Other | Admitting: Ophthalmology

## 2023-12-03 ENCOUNTER — Encounter (INDEPENDENT_AMBULATORY_CARE_PROVIDER_SITE_OTHER): Payer: Self-pay | Admitting: Ophthalmology

## 2023-12-03 DIAGNOSIS — H353211 Exudative age-related macular degeneration, right eye, with active choroidal neovascularization: Secondary | ICD-10-CM

## 2023-12-03 DIAGNOSIS — H353231 Exudative age-related macular degeneration, bilateral, with active choroidal neovascularization: Secondary | ICD-10-CM | POA: Diagnosis not present

## 2023-12-03 DIAGNOSIS — Z961 Presence of intraocular lens: Secondary | ICD-10-CM

## 2023-12-03 DIAGNOSIS — I10 Essential (primary) hypertension: Secondary | ICD-10-CM | POA: Diagnosis not present

## 2023-12-03 DIAGNOSIS — H35033 Hypertensive retinopathy, bilateral: Secondary | ICD-10-CM | POA: Diagnosis not present

## 2023-12-03 DIAGNOSIS — H353221 Exudative age-related macular degeneration, left eye, with active choroidal neovascularization: Secondary | ICD-10-CM

## 2023-12-03 MED ORDER — FARICIMAB-SVOA 6 MG/0.05ML IZ SOSY
6.0000 mg | PREFILLED_SYRINGE | INTRAVITREAL | Status: AC | PRN
  Administered 2023-12-03: 6 mg via INTRAVITREAL

## 2023-12-04 DIAGNOSIS — M1712 Unilateral primary osteoarthritis, left knee: Secondary | ICD-10-CM | POA: Diagnosis not present

## 2023-12-11 ENCOUNTER — Other Ambulatory Visit: Payer: Self-pay

## 2023-12-11 DIAGNOSIS — M1712 Unilateral primary osteoarthritis, left knee: Secondary | ICD-10-CM | POA: Diagnosis not present

## 2023-12-15 ENCOUNTER — Other Ambulatory Visit (HOSPITAL_COMMUNITY): Payer: Self-pay

## 2023-12-16 ENCOUNTER — Other Ambulatory Visit: Payer: Self-pay

## 2023-12-16 NOTE — Progress Notes (Signed)
 Specialty Pharmacy Refill Coordination Note  Amanda [name] is a 78 y.o. female contacted today regarding refills of specialty medication(s) Acalabrutinib  Maleate (Calquence )   Patient requested Delivery   Delivery date: 12/22/23   Verified address: [address]   [place] [id number]   Medication will be filled on 12/19/23.

## 2023-12-19 ENCOUNTER — Other Ambulatory Visit: Payer: Self-pay

## 2024-01-06 NOTE — Progress Notes (Signed)
 [facility] - Clinic Note  01/07/2024    CHIEF COMPLAINT Patient presents for Retina Follow Up  HISTORY OF PRESENT ILLNESS: Amanda [name] is a 78 y.o. female who presents to the clinic today for:  HPI     Retina Follow Up   Patient presents with  Wet AMD.  In left eye.  This started 5 weeks ago.  Duration of 5 weeks.  Since onset it is stable.  I, the attending physician,  performed the HPI with the patient and updated documentation appropriately.        Comments   5 week retina follow up ARMD OS and IVV OS pt denies any vision changes noticed she has some floaters but denies any flashes       Last edited by Rennis Chris, MD on 01/07/2024  5:57 PM.    Patient states feels like she did "bad" on the eye chart, she feels like the room she is in affects how she does  Referring physician: Alma Downs, PA-C [facility] [address] [place],  [place] [address]  HISTORICAL INFORMATION:  Selected notes from the MEDICAL RECORD NUMBER Referred by Alma Downs, PA on 11.16.21 for eval of dry to wet AMD conversion OS.   CURRENT MEDICATIONS: No current outpatient medications on file. (Ophthalmic Drugs)   No current facility-administered medications for this visit. (Ophthalmic Drugs)   Current Outpatient Medications (Other)  Medication Sig   acalabrutinib maleate (CALQUENCE) 100 MG tablet Take 1 tablet (100 mg total) by mouth 2 (two) times daily.   acetaminophen (TYLENOL) 500 MG tablet Take 500 mg by mouth every 6 (six) hours as needed for pain.   acyclovir (ZOVIRAX) 400 MG tablet Take 1 tablet (400 mg total) by mouth daily.   amLODipine (NORVASC) 5 MG tablet Take 5 mg by mouth daily.   atorvastatin (LIPITOR) 40 MG tablet Take 1 tablet by mouth daily.   Calcium Carb-Cholecalciferol (CALCIUM + D3) 600-200 MG-UNIT TABS Take 1 tablet by mouth 3 (three) times a week.    fluticasone (FLONASE) 50 MCG/ACT nasal spray Place 2 sprays into  the nose daily.   folic acid (FOLVITE) 1 MG tablet Take 1 mg by mouth daily.   hydrochlorothiazide (HYDRODIURIL) 25 MG tablet Take 25 mg by mouth daily.   Multiple Vitamin (MULTIVITAMIN) tablet Take 1 tablet by mouth daily.   Multiple Vitamins-Minerals (EYE VITAMINS & MINERALS) TABS Take by mouth.   potassium chloride SA (K-DUR,KLOR-CON) 20 MEQ tablet Take 20 mEq by mouth 3 (three) times a week. Mon, Wed, Fri   No current facility-administered medications for this visit. (Other)   REVIEW OF SYSTEMS: ROS   Positive for: Cardiovascular, Eyes Negative for: Constitutional, Gastrointestinal, Neurological, Skin, Genitourinary, Musculoskeletal, HENT, Endocrine, Respiratory, Psychiatric, Allergic/Imm, Heme/Lymph Last edited by Etheleen Mayhew, COT on 01/07/2024  2:05 PM.      ALLERGIES Allergies  Allergen Reactions   Codeine Nausea And Vomiting   Demerol [Meperidine] Nausea And Vomiting   Morphine And Codeine Nausea And Vomiting   Ciprofloxacin Rash   Sulfa Antibiotics Rash   PAST MEDICAL HISTORY Past Medical History:  Diagnosis Date   A-fib [facility])    Adenopathy    Atrial fibrillation (HCC)    CLL (chronic lymphocytic leukemia) (HCC) 08/31/2013   HTN (hypertension)    Hyperlipidemia    Hypertensive retinopathy    Lymphocytosis    Macular degeneration    OU   Past Surgical History:  Procedure Laterality  Date   ABDOMINAL HYSTERECTOMY     endometriosis, fibroid   APPENDECTOMY     BREAST BIOPSY     benign cyst.   CATARACT EXTRACTION Bilateral 2019   CHOLECYSTECTOMY     COLONOSCOPY  11/2012   Per Dr. Matthias Hughs.  neg.    EYE SURGERY     IR FLUORO GUIDE PORT INSERTION RIGHT  08/12/2017   IR REMOVAL TUN ACCESS W/ PORT W/O FL MOD SED  01/29/2018   IR US GUIDE VASC ACCESS RIGHT  08/12/2017   SQUAMOUS CELL CARCINOMA EXCISION  12/28/2020   Excised from top of head   THUMB ARTHROSCOPY     TOTAL HIP ARTHROPLASTY  2010   FAMILY HISTORY Family History  Problem Relation Age of  Onset   Renal Disease Mother    Cancer Mother        colon cancer   Cancer Father        colon cancer   SOCIAL HISTORY Social History   Tobacco Use   Smoking status: Never   Smokeless tobacco: Never  Vaping Use   Vaping status: Never Used  Substance Use Topics   Alcohol use: No   Drug use: No       OPHTHALMIC EXAM: Base Eye Exam     Visual Acuity (Snellen - Linear)       Right Left   Dist cc 20/25 -2 20/25   Dist ph cc NI 20/20 -2    Correction: Glasses         Tonometry (Tonopen, 2:14 PM)       Right Left   Pressure 12 16         Pupils       Pupils Dark Light Shape React APD   Right PERRL 3 2 Round Brisk None   Left PERRL 3 2 Round Brisk None         Visual Fields       Left Right    Full Full         Extraocular Movement       Right Left    Full, Ortho Full, Ortho         Neuro/Psych     Oriented x3: Yes   Mood/Affect: Normal         Dilation     Both eyes: 2.5% Phenylephrine @ 2:14 PM           Slit Lamp and Fundus Exam     Slit Lamp Exam       Right Left   Lids/Lashes Dermatochalasis - upper lid, mild Meibomian gland dysfunction Dermatochalasis - upper lid, mild Meibomian gland dysfunction   Conjunctiva/Sclera White and quiet White and quiet   Cornea Trace Debris in tear film, well healed temporal cataract wounds, 1+ pigmented Guttata, trace PEE well healed temporal cataract wounds, 2+ Punctate epithelial erosions   Anterior Chamber Deep and quiet Deep and quiet   Iris Round and dilated Round and dilated   Lens PC IOL in good position, trace PCO PC IOL in good position   Anterior Vitreous Trace Vitreous syneresis, Posterior vitreous detachment Trace Vitreous syneresis, Posterior vitreous detachment         Fundus Exam       Right Left   Disc Compact, trace pallor, Sharp rim, mild PPA Compact, Pink and Sharp, temporal PPP   C/D Ratio 0.1 0.2   Macula Blunted foveal reflex, Drusen, RPE mottling, clumping and  early atrophy, shallow PED with trace SRF --  stably improved Blunted foveal reflex, +CNV, Drusen, RPE mottling and clumping; trace shallow SRF -- stably improved, no heme   Vessels attenuated, Tortuous attenuated, Tortuous   Periphery Attached; mild reticular degeneration, no heme Attached, reticular degeneration, No heme           Refraction     Wearing Rx       Sphere Cylinder Axis Add   Right -0.25 +0.75 165 +2.50   Left -0.50 +0.50 012 +2.50    Type: PAL           IMAGING AND PROCEDURES  Imaging and Procedures for 01/07/2024  OCT, Retina - OU - Both Eyes       Right Eye Quality was good. Central Foveal Thickness: 257. Progression has been stable. Findings include normal foveal contour, no IRF, no SRF, retinal drusen , subretinal hyper-reflective material, intraretinal hyper-reflective material, pigment epithelial detachment, outer retinal atrophy (stable improvement in shallow SRF centrally overlying stable low PED, no IRF).   Left Eye Quality was good. Central Foveal Thickness: 261. Progression has been stable. Findings include no IRF, no SRF, abnormal foveal contour, retinal drusen , subretinal hyper-reflective material, choroidal neovascular membrane, pigment epithelial detachment, outer retinal atrophy (Stable resolution of SRF overlying PED).   Notes *Images captured and stored on drive  Diagnosis / Impression:  OD: exu ARMD -- stable improvement in shallow SRF centrally overlying stable low PED, no IRF OS: exu ARMD -- stable resolution of SRF overlying stable PED   Clinical management:  See below  Abbreviations: NFP - Normal foveal profile. CME - cystoid macular edema. PED - pigment epithelial detachment. IRF - intraretinal fluid. SRF - subretinal fluid. EZ - ellipsoid zone. ERM - epiretinal membrane. ORA - outer retinal atrophy. ORT - outer retinal tubulation. SRHM - subretinal hyper-reflective material. IRHM - intraretinal hyper-reflective material       Intravitreal Injection, Pharmacologic Agent - OS - Left Eye       Time Out 01/07/2024. 3:09 PM. Confirmed correct patient, procedure, site, and patient consented.   Anesthesia Topical anesthesia was used. Anesthetic medications included Lidocaine 2%, Proparacaine 0.5%.   Procedure Preparation included 5% betadine to ocular surface, eyelid speculum. A (32g) needle was used.   Injection: 6 mg faricimab-svoa 6 MG/0.05ML   Route: Intravitreal, Site: Left Eye   NDC: 81191-478-29, Lot: [id number], Expiration date: 03/10/2025, Waste: 0 mL   Post-op Post injection exam found visual acuity of at least counting fingers. The patient tolerated the procedure well. There were no complications. The patient received written and verbal post procedure care education.      Intravitreal Injection, Pharmacologic Agent - OD - Right Eye       Time Out 01/07/2024. 3:14 PM. Confirmed correct patient, procedure, site, and patient consented.   Anesthesia Topical anesthesia was used. Anesthetic medications included Lidocaine 2%, Proparacaine 0.5%.   Procedure Preparation included 5% betadine to ocular surface, eyelid speculum. A (32g) needle was used.   Injection: 6 mg faricimab-svoa 6 MG/0.05ML   Route: Intravitreal, Site: Right Eye   NDC: 57846-962-95, Lot: [id number], Expiration date: 03/10/2025, Waste: 0 mL   Post-op Post injection exam found visual acuity of at least counting fingers. The patient tolerated the procedure well. There were no complications. The patient received written and verbal post procedure care education. Post injection medications were not given.             ASSESSMENT/PLAN:    ICD-10-CM   1. Exudative age-related macular degeneration of left eye with active  choroidal neovascularization (HCC)  H35.3221 OCT, Retina - OU - Both Eyes    Intravitreal Injection, Pharmacologic Agent - OS - Left Eye    faricimab-svoa (VABYSMO) 6mg /0.41mL intravitreal injection    CANCELED:  Intravitreal Injection, Pharmacologic Agent - OS - Left Eye    2. Exudative age-related macular degeneration of right eye with active choroidal neovascularization (HCC)  H35.3211 OCT, Retina - OU - Both Eyes    Intravitreal Injection, Pharmacologic Agent - OD - Right Eye    faricimab-svoa (VABYSMO) 6mg /0.35mL intravitreal injection    3. Essential hypertension  I10     4. Hypertensive retinopathy of both eyes  H35.033     5. Pseudophakia, both eyes  Z96.1      1. Exudative age related macular degeneration, OS  - [facility] pt with known history of nonexudative ARMD OU - acute "haze over vision" OS -- onset, Friday 11.12.21 -- initial exam with central subretinal heme - s/p IVA OS #1 (11.17.21), #2 (12.15.21), #3 (01.12.22), #4 (02.09.22), #5 (03.16.22), #6 (04.20.22), #7 (06.01.22), #8 (06.29.22)--IVA resistance ========================= - s/p IVE OS #1 (07.27.22), #2 (08.24.22), #3 (09.21.22), #4 (10.19.22), #5 (11.16.22), #6 (12.16.22), #7 (01.18.23), #8 (02.15.23)--IVE resistance ========================= - s/p IVV OS #1 SAMPLE (03.15.23), #2 (04.12.23), #3 (05.10.23), #4 (06.07.23), #5 (07.19.23), #6 (08.22.23), #7 (09.27.23), #8 (12.20.23), #9 (01.31.24), #10 (03.13.24), #11 (04.24.24), #12 (05.31.24), #13 (07.09.24), #14 (08.21.24), #15 (10.02.24), #16 (11.13.24), #17 (12.18.24), #18 (01.22.25)  **h/o increased fluid at 6 wks on 11.13.24**  - BCVA OS 20/20 - stable - exam and OCT show OS: stable resolution of SRF overlying stable PED at 5 wks  - recommend IVV OS #19 today, 02.26.25 with follow up in 5 weeks - pt wishes to be treated with IVV OS - RBA of procedure discussed, questions answered - IVV informed consent obtained and signed 01.22.25 (OS) - see procedure note   - f/u 5 weeks -- DFE/OCT, possible injections  2. Exudative age related macular degeneration OD  - conversion to exudative ARMD noted on 03.15.23 exam  - exam with new SRH temporal macula -- stably improved  today - s/p IVE OD #1 (03.15.23), #2 (04.12.23), #3 (05.10.23), #4 (06.07.23), #5 (07.19.23), #6 (08.19.23), #7 (09.27.23), #8 (11.01.23), #9 (03.11.24), #10 (07.09.24), #11 (08.21.24), #12 (10.02.24), #13 (11.13.24), #14 (12.18.24) -- IVE resistance - s/p IVV OD #1 (01.22.25)  **h/o increased fluid at 6 wks on 11.13.24**  - BCVA OD 20/25 from 20/20 - OCT OD: stable improvement in shallow SRF centrally overlying stable low PED, no IRF at 5 weeks - recommend IVV OD #2 today, 02.26.25 w/ f/u in 5 wks  - pt in agreement  - RBA of procedure discussed, questions answered - see procedure note - IVV informed consent obtained and signed , 01.25.25 (OU)  - f/u 5 weeks -- DFE/OCT, possible injxns  3,4. Hypertensive retinopathy OU - discussed importance of tight BP control - monitor     5. Pseudophakia OU  - s/p CE/IOL OU  - IOLs in good position, doing well  - monitor   Ophthalmic Meds Ordered this visit:  Meds ordered this encounter  Medications   faricimab-svoa (VABYSMO) 6mg /0.15mL intravitreal injection   faricimab-svoa (VABYSMO) 6mg /0.107mL intravitreal injection     Return in about 5 weeks (around 02/11/2024) for exu ARMD OU, DFE, OCT, Possible Injxns.  There are no Patient Instructions on file for this visit.  This document serves as a record of services personally performed by Karie Chimera, MD, PhD. It  was created on their behalf by Glee Arvin. Manson Passey, OA an ophthalmic technician. The creation of this record is the provider's dictation and/or activities during the visit.    Electronically signed by: Glee Arvin. Manson Passey, OA 01/07/24 5:59 PM  Karie Chimera, M.D., Ph.D. Diseases & Surgery of the Retina and Vitreous [facility]  I have reviewed the above documentation for accuracy and completeness, and I agree with the above. Karie Chimera, M.D., Ph.D. 01/07/24 6:01 PM   Abbreviations: M myopia (nearsighted); A astigmatism; H hyperopia (farsighted); P  presbyopia; Mrx spectacle prescription;  CTL contact lenses; OD right eye; OS left eye; OU both eyes  XT exotropia; ET esotropia; PEK punctate epithelial keratitis; PEE punctate epithelial erosions; DES dry eye syndrome; MGD meibomian gland dysfunction; ATs artificial tears; PFAT's preservative free artificial tears; NSC nuclear sclerotic cataract; PSC posterior subcapsular cataract; ERM epi-retinal membrane; PVD posterior vitreous detachment; RD retinal detachment; DM diabetes mellitus; DR diabetic retinopathy; NPDR non-proliferative diabetic retinopathy; PDR proliferative diabetic retinopathy; CSME clinically significant macular edema; DME diabetic macular edema; dbh dot blot hemorrhages; CWS cotton wool spot; POAG primary open angle glaucoma; C/D cup-to-disc ratio; HVF humphrey visual field; GVF goldmann visual field; OCT optical coherence tomography; IOP intraocular pressure; BRVO Branch retinal vein occlusion; CRVO central retinal vein occlusion; CRAO central retinal artery occlusion; BRAO branch retinal artery occlusion; RT retinal tear; SB scleral buckle; PPV pars plana vitrectomy; VH Vitreous hemorrhage; PRP panretinal laser photocoagulation; IVK intravitreal kenalog; VMT vitreomacular traction; MH Macular hole;  NVD neovascularization of the disc; NVE neovascularization elsewhere; AREDS age related eye disease study; ARMD age related macular degeneration; POAG primary open angle glaucoma; EBMD epithelial/anterior basement membrane dystrophy; ACIOL anterior chamber intraocular lens; IOL intraocular lens; PCIOL posterior chamber intraocular lens; Phaco/IOL phacoemulsification with intraocular lens placement; PRK photorefractive keratectomy; LASIK laser assisted in situ keratomileusis; HTN hypertension; DM diabetes mellitus; COPD chronic obstructive pulmonary disease

## 2024-01-07 ENCOUNTER — Ambulatory Visit (INDEPENDENT_AMBULATORY_CARE_PROVIDER_SITE_OTHER): Payer: Medicare Other | Admitting: Ophthalmology

## 2024-01-07 ENCOUNTER — Encounter (INDEPENDENT_AMBULATORY_CARE_PROVIDER_SITE_OTHER): Payer: Self-pay | Admitting: Ophthalmology

## 2024-01-07 DIAGNOSIS — H35033 Hypertensive retinopathy, bilateral: Secondary | ICD-10-CM | POA: Diagnosis not present

## 2024-01-07 DIAGNOSIS — Z961 Presence of intraocular lens: Secondary | ICD-10-CM | POA: Diagnosis not present

## 2024-01-07 DIAGNOSIS — I10 Essential (primary) hypertension: Secondary | ICD-10-CM | POA: Diagnosis not present

## 2024-01-07 DIAGNOSIS — H353231 Exudative age-related macular degeneration, bilateral, with active choroidal neovascularization: Secondary | ICD-10-CM

## 2024-01-07 DIAGNOSIS — H353211 Exudative age-related macular degeneration, right eye, with active choroidal neovascularization: Secondary | ICD-10-CM

## 2024-01-07 DIAGNOSIS — H353221 Exudative age-related macular degeneration, left eye, with active choroidal neovascularization: Secondary | ICD-10-CM

## 2024-01-07 MED ORDER — FARICIMAB-SVOA 6 MG/0.05ML IZ SOSY
6.0000 mg | PREFILLED_SYRINGE | INTRAVITREAL | Status: AC | PRN
Start: 2024-01-07 — End: 2024-01-07
  Administered 2024-01-07: 6 mg via INTRAVITREAL

## 2024-01-08 ENCOUNTER — Other Ambulatory Visit: Payer: Self-pay

## 2024-01-12 ENCOUNTER — Other Ambulatory Visit (HOSPITAL_COMMUNITY): Payer: Self-pay

## 2024-01-15 ENCOUNTER — Other Ambulatory Visit (HOSPITAL_COMMUNITY): Payer: Self-pay

## 2024-01-15 ENCOUNTER — Other Ambulatory Visit: Payer: Self-pay

## 2024-01-19 ENCOUNTER — Other Ambulatory Visit: Payer: Self-pay

## 2024-01-19 ENCOUNTER — Other Ambulatory Visit (HOSPITAL_COMMUNITY): Payer: Self-pay

## 2024-01-19 NOTE — Progress Notes (Signed)
 Specialty Pharmacy Refill Coordination Note  Amanda [name] is a 78 y.o. female contacted today regarding refills of specialty medication(s) Acalabrutinib Maleate (Calquence)   Patient requested Delivery   Delivery date: 01/21/24   Verified address: [address]   [place] [id number]   Medication will be filled on 01/20/24.

## 2024-01-20 DIAGNOSIS — M1712 Unilateral primary osteoarthritis, left knee: Secondary | ICD-10-CM | POA: Diagnosis not present

## 2024-01-21 ENCOUNTER — Other Ambulatory Visit (HOSPITAL_COMMUNITY): Payer: Self-pay

## 2024-02-03 DIAGNOSIS — I480 Paroxysmal atrial fibrillation: Secondary | ICD-10-CM | POA: Diagnosis not present

## 2024-02-03 DIAGNOSIS — I10 Essential (primary) hypertension: Secondary | ICD-10-CM | POA: Diagnosis not present

## 2024-02-09 ENCOUNTER — Other Ambulatory Visit (HOSPITAL_COMMUNITY): Payer: Self-pay

## 2024-02-09 DIAGNOSIS — I10 Essential (primary) hypertension: Secondary | ICD-10-CM | POA: Diagnosis not present

## 2024-02-09 DIAGNOSIS — E782 Mixed hyperlipidemia: Secondary | ICD-10-CM | POA: Diagnosis not present

## 2024-02-09 DIAGNOSIS — I480 Paroxysmal atrial fibrillation: Secondary | ICD-10-CM | POA: Diagnosis not present

## 2024-02-10 ENCOUNTER — Other Ambulatory Visit (HOSPITAL_COMMUNITY): Payer: Self-pay

## 2024-02-10 NOTE — Progress Notes (Signed)
 [facility] - Clinic Note  02/11/2024    CHIEF COMPLAINT Patient presents for Retina Follow Up  HISTORY OF PRESENT ILLNESS: Amanda [name] is a 78 y.o. female who presents to the clinic today for:  HPI     Retina Follow Up   Patient presents with  Wet AMD.  In left eye.  Duration of 5 weeks.  Since onset it is stable.  I, the attending physician,  performed the HPI with the patient and updated documentation appropriately.        Comments   5 week retina follow up ARMD OS and IVV OS. Pt states she is straining when reading and having a twitch in her left eye, typically at about 30-45 minutes of focus. Pt denies any new flashes/floaters. Pt denies any discomfort. Pt is currently using [place] ou every day and At's about three times per day.      Last edited by Rennis Chris, MD on 02/11/2024  6:26 PM.     Patient states   Referring physician: Alma Downs, PA-C [facility] [address] [place],  [place] [address]  HISTORICAL INFORMATION:  Selected notes from the MEDICAL RECORD NUMBER Referred by Alma Downs, PA on 11.16.21 for eval of dry to wet AMD conversion OS.   CURRENT MEDICATIONS: No current outpatient medications on file. (Ophthalmic Drugs)   No current facility-administered medications for this visit. (Ophthalmic Drugs)   Current Outpatient Medications (Other)  Medication Sig   acalabrutinib maleate (CALQUENCE) 100 MG tablet Take 1 tablet (100 mg total) by mouth 2 (two) times daily.   acetaminophen (TYLENOL) 500 MG tablet Take 500 mg by mouth every 6 (six) hours as needed for pain.   acyclovir (ZOVIRAX) 400 MG tablet Take 1 tablet (400 mg total) by mouth daily.   amLODipine (NORVASC) 5 MG tablet Take 5 mg by mouth daily.   atorvastatin (LIPITOR) 40 MG tablet Take 1 tablet by mouth daily.   Calcium Carb-Cholecalciferol (CALCIUM + D3) 600-200 MG-UNIT TABS Take 1 tablet by mouth 3 (three) times a week.    fluticasone  (FLONASE) 50 MCG/ACT nasal spray Place 2 sprays into the nose daily.   folic acid (FOLVITE) 1 MG tablet Take 1 mg by mouth daily.   hydrochlorothiazide (HYDRODIURIL) 25 MG tablet Take 25 mg by mouth daily.   Multiple Vitamin (MULTIVITAMIN) tablet Take 1 tablet by mouth daily.   Multiple Vitamins-Minerals (EYE VITAMINS & MINERALS) TABS Take by mouth.   potassium chloride SA (K-DUR,KLOR-CON) 20 MEQ tablet Take 20 mEq by mouth 3 (three) times a week. Mon, Wed, Fri   No current facility-administered medications for this visit. (Other)   REVIEW OF SYSTEMS: ROS   Positive for: Cardiovascular, Eyes Negative for: Constitutional, Gastrointestinal, Neurological, Skin, Genitourinary, Musculoskeletal, HENT, Endocrine, Respiratory, Psychiatric, Allergic/Imm, Heme/Lymph Last edited by Elicia Lamp, COT on 02/11/2024  1:53 PM.     ALLERGIES Allergies  Allergen Reactions   Codeine Nausea And Vomiting   Demerol [Meperidine] Nausea And Vomiting   Morphine And Codeine Nausea And Vomiting   Ciprofloxacin Rash   Sulfa Antibiotics Rash   PAST MEDICAL HISTORY Past Medical History:  Diagnosis Date   A-fib (HCC)    Adenopathy    Atrial fibrillation (HCC)    CLL (chronic lymphocytic leukemia) (HCC) 08/31/2013   HTN (hypertension)    Hyperlipidemia    Hypertensive retinopathy    Lymphocytosis    Macular degeneration    OU   Past  Surgical History:  Procedure Laterality Date   ABDOMINAL HYSTERECTOMY     endometriosis, fibroid   APPENDECTOMY     BREAST BIOPSY     benign cyst.   CATARACT EXTRACTION Bilateral 2019   CHOLECYSTECTOMY     COLONOSCOPY  11/2012   Per Dr. Matthias Hughs.  neg.    EYE SURGERY     IR FLUORO GUIDE PORT INSERTION RIGHT  08/12/2017   IR REMOVAL TUN ACCESS W/ PORT W/O FL MOD SED  01/29/2018   IR US GUIDE VASC ACCESS RIGHT  08/12/2017   SQUAMOUS CELL CARCINOMA EXCISION  12/28/2020   Excised from top of head   THUMB ARTHROSCOPY     TOTAL HIP ARTHROPLASTY  2010   FAMILY  HISTORY Family History  Problem Relation Age of Onset   Renal Disease Mother    Cancer Mother        colon cancer   Cancer Father        colon cancer   SOCIAL HISTORY Social History   Tobacco Use   Smoking status: Never   Smokeless tobacco: Never  Vaping Use   Vaping status: Never Used  Substance Use Topics   Alcohol use: No   Drug use: No       OPHTHALMIC EXAM: Base Eye Exam     Visual Acuity (Snellen - Linear)       Right Left   Dist [place] 20/25 +2 20/20 -2    Correction: Glasses         Tonometry (Tonopen, 1:58 PM)       Right Left   Pressure 12 11         Pupils       Dark Light Shape React APD   Right 3 2 Round Brisk None   Left 3 2 Round Brisk None         Visual Fields       Left Right    Full Full         Extraocular Movement       Right Left    Full, Ortho Full, Ortho         Neuro/Psych     Oriented x3: Yes   Mood/Affect: Normal         Dilation     Both eyes: 1.0% Mydriacyl, 2.5% Phenylephrine @ 1:58 PM           Slit Lamp and Fundus Exam     Slit Lamp Exam       Right Left   Lids/Lashes Dermatochalasis - upper lid, mild Meibomian gland dysfunction Dermatochalasis - upper lid, mild Meibomian gland dysfunction   Conjunctiva/Sclera White and quiet White and quiet   Cornea Trace Debris in tear film, well healed temporal cataract wounds, 1+ pigmented Guttata, trace PEE well healed temporal cataract wounds, 2+ Punctate epithelial erosions   Anterior Chamber Deep and quiet Deep and quiet   Iris Round and dilated Round and dilated   Lens PC IOL in good position, trace PCO PC IOL in good position   Anterior Vitreous Trace Vitreous syneresis, Posterior vitreous detachment Trace Vitreous syneresis, Posterior vitreous detachment         Fundus Exam       Right Left   Disc Compact, trace pallor, Sharp rim, mild PPA Compact, Pink and Sharp, temporal PPP   C/D Ratio 0.1 0.2   Macula Blunted foveal reflex, Drusen, RPE  mottling, clumping and early atrophy, shallow PED with trace SRF -- stably improved  Blunted foveal reflex, +CNV, Drusen, RPE mottling and clumping; trace shallow SRF -- stably improved, no heme   Vessels attenuated, Tortuous attenuated, Tortuous   Periphery Attached; mild reticular degeneration, no heme Attached, reticular degeneration, No heme           Refraction     Wearing Rx       Sphere Cylinder Axis Add   Right -0.25 +0.75 165 +2.50   Left -0.50 +0.50 012 +2.50    Type: PAL           IMAGING AND PROCEDURES  Imaging and Procedures for 02/11/2024  OCT, Retina - OU - Both Eyes       Right Eye Quality was good. Central Foveal Thickness: 251. Progression has been stable. Findings include normal foveal contour, no IRF, no SRF, retinal drusen , subretinal hyper-reflective material, intraretinal hyper-reflective material, pigment epithelial detachment, outer retinal atrophy (stable improvement in shallow SRF centrally overlying stable low PED, no IRF).   Left Eye Quality was good. Central Foveal Thickness: 264. Progression has been stable. Findings include no IRF, no SRF, abnormal foveal contour, retinal drusen , subretinal hyper-reflective material, choroidal neovascular membrane, pigment epithelial detachment, outer retinal atrophy (Stable resolution of SRF overlying PED).   Notes *Images captured and stored on drive  Diagnosis / Impression:  OD: exu ARMD -- stable improvement in shallow SRF centrally overlying stable low PED, no IRF OS: exu ARMD -- stable resolution of SRF overlying stable PED   Clinical management:  See below  Abbreviations: NFP - Normal foveal profile. CME - cystoid macular edema. PED - pigment epithelial detachment. IRF - intraretinal fluid. SRF - subretinal fluid. EZ - ellipsoid zone. ERM - epiretinal membrane. ORA - outer retinal atrophy. ORT - outer retinal tubulation. SRHM - subretinal hyper-reflective material. IRHM - intraretinal  hyper-reflective material      Intravitreal Injection, Pharmacologic Agent - OS - Left Eye       Time Out 02/11/2024. 3:32 PM. Confirmed correct patient, procedure, site, and patient consented.   Anesthesia Topical anesthesia was used. Anesthetic medications included Lidocaine 2%, Proparacaine 0.5%.   Procedure Preparation included 5% betadine to ocular surface, eyelid speculum. A supplied (32g) needle was used.   Injection: 6 mg faricimab-svoa 6 MG/0.05ML   Route: Intravitreal, Site: Left Eye   NDC: 40981-191-47, Lot: [id number], Expiration date: 03/10/2025, Waste: 0 mL   Post-op Post injection exam found visual acuity of at least counting fingers. The patient tolerated the procedure well. There were no complications. The patient received written and verbal post procedure care education.      Intravitreal Injection, Pharmacologic Agent - OD - Right Eye       Time Out 02/11/2024. 3:32 PM. Confirmed correct patient, procedure, site, and patient consented.   Anesthesia Topical anesthesia was used. Anesthetic medications included Lidocaine 2%, Proparacaine 0.5%.   Procedure Preparation included 5% betadine to ocular surface, eyelid speculum. A supplied (32g) needle was used.   Injection: 6 mg faricimab-svoa 6 MG/0.05ML   Route: Intravitreal, Site: Right Eye   NDC: 08657-846-96, Lot: [id number], Expiration date: 03/10/2025, Waste: 0 mL   Post-op Post injection exam found visual acuity of at least counting fingers. The patient tolerated the procedure well. There were no complications. The patient received written and verbal post procedure care education. Post injection medications were not given.            ASSESSMENT/PLAN:    ICD-10-CM   1. Exudative age-related macular degeneration of left eye with active choroidal  neovascularization (HCC)  H35.3221 OCT, Retina - OU - Both Eyes    Intravitreal Injection, Pharmacologic Agent - OS - Left Eye    faricimab-svoa (VABYSMO)  6mg /0.52mL intravitreal injection    2. Exudative age-related macular degeneration of right eye with active choroidal neovascularization (HCC)  H35.3211 Intravitreal Injection, Pharmacologic Agent - OD - Right Eye    faricimab-svoa (VABYSMO) 6mg /0.69mL intravitreal injection    3. Essential hypertension  I10     4. Hypertensive retinopathy of both eyes  H35.033     5. Pseudophakia, both eyes  Z96.1      1. Exudative age related macular degeneration, OS  - [facility] pt with known history of nonexudative ARMD OU - acute "haze over vision" OS -- onset, Friday 11.12.21 -- initial exam with central subretinal heme - s/p IVA OS #1 (11.17.21), #2 (12.15.21), #3 (01.12.22), #4 (02.09.22), #5 (03.16.22), #6 (04.20.22), #7 (06.01.22), #8 (06.29.22)--IVA resistance ========================= - s/p IVE OS #1 (07.27.22), #2 (08.24.22), #3 (09.21.22), #4 (10.19.22), #5 (11.16.22), #6 (12.16.22), #7 (01.18.23), #8 (02.15.23)--IVE resistance ========================= - s/p IVV OS #1 SAMPLE (03.15.23), #2 (04.12.23), #3 (05.10.23), #4 (06.07.23), #5 (07.19.23), #6 (08.22.23), #7 (09.27.23), #8 (12.20.23), #9 (01.31.24), #10 (03.13.24), #11 (04.24.24), #12 (05.31.24), #13 (07.09.24), #14 (08.21.24), #15 (10.02.24), #16 (11.13.24), #17 (12.18.24), #18 (01.22.25), #19 902.26.25)  **h/o increased fluid at 6 wks on 11.13.24**  - BCVA OS 20/20 - stable - exam and OCT show OS: stable resolution of SRF overlying stable PED at 5 wks  - recommend IVV OS #20 today, 04.02.25 with follow up in 5 weeks - pt wishes to be treated with IVV OS - RBA of procedure discussed, questions answered - IVV informed consent obtained and signed 01.22.25 (OS) - see procedure note   - f/u 5 weeks -- DFE/OCT, possible injections  2. Exudative age related macular degeneration OD  - conversion to exudative ARMD noted on 03.15.23 exam  - exam with new SRH temporal macula -- stably improved today - s/p IVE OD #1 (03.15.23), #2  (04.12.23), #3 (05.10.23), #4 (06.07.23), #5 (07.19.23), #6 (08.19.23), #7 (09.27.23), #8 (11.01.23), #9 (03.11.24), #10 (07.09.24), #11 (08.21.24), #12 (10.02.24), #13 (11.13.24), #14 (12.18.24) -- IVE resistance - s/p IVV OD #1 (01.22.25), #2 (02.26.25)  **h/o increased fluid at 6 wks on 11.13.24**  - BCVA OD stable at 20/25 - OCT OD: stable improvement in shallow SRF centrally overlying stable low PED, no IRF at 5 weeks - recommend IVV OD #3 today, 04.02.25 w/ f/u in 5 wks  - pt in agreement  - RBA of procedure discussed, questions answered - see procedure note - IVV informed consent obtained and signed , 01.25.25 (OU)  - f/u 5 weeks -- DFE/OCT, possible injxns  3,4. Hypertensive retinopathy OU - discussed importance of tight BP control - monitor     5. Pseudophakia OU  - s/p CE/IOL OU  - IOLs in good position, doing well  - monitor   Ophthalmic Meds Ordered this visit:  Meds ordered this encounter  Medications   faricimab-svoa (VABYSMO) 6mg /0.32mL intravitreal injection   faricimab-svoa (VABYSMO) 6mg /0.59mL intravitreal injection     Return in about 5 weeks (around 03/17/2024) for f/u exu ARMD OU, DFE, OCT, Possible Injxn.  There are no Patient Instructions on file for this visit.  This document serves as a record of services personally performed by Karie Chimera, MD, PhD. It was created on their behalf by Charlette Caffey, COT an ophthalmic technician. The creation of this record is  the provider's dictation and/or activities during the visit.    Electronically signed by:  Charlette Caffey, COT  02/12/24 12:03 AM  This document serves as a record of services personally performed by Karie Chimera, MD, PhD. It was created on their behalf by Glee Arvin. Manson Passey, OA an ophthalmic technician. The creation of this record is the provider's dictation and/or activities during the visit.    Electronically signed by: Glee Arvin. Manson Passey, OA 02/12/24 12:03 AM  Karie Chimera, M.D.,  Ph.D. Diseases & Surgery of the Retina and Vitreous [facility]  I have reviewed the above documentation for accuracy and completeness, and I agree with the above. Karie Chimera, M.D., Ph.D. 02/12/24 12:05 AM   Abbreviations: M myopia (nearsighted); A astigmatism; H hyperopia (farsighted); P presbyopia; Mrx spectacle prescription;  CTL contact lenses; OD right eye; OS left eye; OU both eyes  XT exotropia; ET esotropia; PEK punctate epithelial keratitis; PEE punctate epithelial erosions; DES dry eye syndrome; MGD meibomian gland dysfunction; ATs artificial tears; PFAT's preservative free artificial tears; NSC nuclear sclerotic cataract; PSC posterior subcapsular cataract; ERM epi-retinal membrane; PVD posterior vitreous detachment; RD retinal detachment; DM diabetes mellitus; DR diabetic retinopathy; NPDR non-proliferative diabetic retinopathy; PDR proliferative diabetic retinopathy; CSME clinically significant macular edema; DME diabetic macular edema; dbh dot blot hemorrhages; CWS cotton wool spot; POAG primary open angle glaucoma; C/D cup-to-disc ratio; HVF humphrey visual field; GVF goldmann visual field; OCT optical coherence tomography; IOP intraocular pressure; BRVO Branch retinal vein occlusion; CRVO central retinal vein occlusion; CRAO central retinal artery occlusion; BRAO branch retinal artery occlusion; RT retinal tear; SB scleral buckle; PPV pars plana vitrectomy; VH Vitreous hemorrhage; PRP panretinal laser photocoagulation; IVK intravitreal kenalog; VMT vitreomacular traction; MH Macular hole;  NVD neovascularization of the disc; NVE neovascularization elsewhere; AREDS age related eye disease study; ARMD age related macular degeneration; POAG primary open angle glaucoma; EBMD epithelial/anterior basement membrane dystrophy; ACIOL anterior chamber intraocular lens; IOL intraocular lens; PCIOL posterior chamber intraocular lens; Phaco/IOL phacoemulsification with  intraocular lens placement; PRK photorefractive keratectomy; LASIK laser assisted in situ keratomileusis; HTN hypertension; DM diabetes mellitus; COPD chronic obstructive pulmonary disease

## 2024-02-11 ENCOUNTER — Encounter (INDEPENDENT_AMBULATORY_CARE_PROVIDER_SITE_OTHER): Payer: Self-pay | Admitting: Ophthalmology

## 2024-02-11 ENCOUNTER — Ambulatory Visit (INDEPENDENT_AMBULATORY_CARE_PROVIDER_SITE_OTHER): Payer: Medicare Other | Admitting: Ophthalmology

## 2024-02-11 DIAGNOSIS — H35033 Hypertensive retinopathy, bilateral: Secondary | ICD-10-CM

## 2024-02-11 DIAGNOSIS — Z961 Presence of intraocular lens: Secondary | ICD-10-CM | POA: Diagnosis not present

## 2024-02-11 DIAGNOSIS — H353231 Exudative age-related macular degeneration, bilateral, with active choroidal neovascularization: Secondary | ICD-10-CM | POA: Diagnosis not present

## 2024-02-11 DIAGNOSIS — H353221 Exudative age-related macular degeneration, left eye, with active choroidal neovascularization: Secondary | ICD-10-CM

## 2024-02-11 DIAGNOSIS — I10 Essential (primary) hypertension: Secondary | ICD-10-CM

## 2024-02-11 DIAGNOSIS — H353211 Exudative age-related macular degeneration, right eye, with active choroidal neovascularization: Secondary | ICD-10-CM

## 2024-02-11 MED ORDER — FARICIMAB-SVOA 6 MG/0.05ML IZ SOSY
6.0000 mg | PREFILLED_SYRINGE | INTRAVITREAL | Status: AC | PRN
  Administered 2024-02-11: 6 mg via INTRAVITREAL

## 2024-02-12 DIAGNOSIS — M85852 Other specified disorders of bone density and structure, left thigh: Secondary | ICD-10-CM | POA: Diagnosis not present

## 2024-02-12 DIAGNOSIS — I10 Essential (primary) hypertension: Secondary | ICD-10-CM | POA: Diagnosis not present

## 2024-02-12 DIAGNOSIS — C9191 Lymphoid leukemia, unspecified, in remission: Secondary | ICD-10-CM | POA: Diagnosis not present

## 2024-02-12 DIAGNOSIS — E782 Mixed hyperlipidemia: Secondary | ICD-10-CM | POA: Diagnosis not present

## 2024-02-16 NOTE — Assessment & Plan Note (Signed)
 She was diagnosed with chronic lymphocytic leukemia since April 2014 FISH positive for deletion 13 q.  She was treated with Bendamustine and rituximab between October 2018 to January 2019 but relapsed in 2014 She has been on maintenance Calquence since May 2024 CBC is reviewed Her leukocytosis is almost back to normal Once her she has achieved normalization of her white blood cell count, I will order CT imaging to assess for objective assessment of response to therapy

## 2024-02-17 ENCOUNTER — Other Ambulatory Visit: Payer: Self-pay

## 2024-02-19 ENCOUNTER — Inpatient Hospital Stay: Payer: Medicare Other | Attending: Hematology and Oncology

## 2024-02-19 ENCOUNTER — Ambulatory Visit: Payer: Medicare Other | Admitting: Hematology and Oncology

## 2024-02-19 ENCOUNTER — Other Ambulatory Visit: Payer: Medicare Other

## 2024-02-19 ENCOUNTER — Inpatient Hospital Stay (HOSPITAL_BASED_OUTPATIENT_CLINIC_OR_DEPARTMENT_OTHER): Payer: Medicare Other | Admitting: Hematology and Oncology

## 2024-02-19 ENCOUNTER — Encounter: Payer: Self-pay | Admitting: Hematology and Oncology

## 2024-02-19 VITALS — BP 137/68 | HR 67 | Temp 97.7°F | Resp 18 | Ht 61.0 in | Wt 141.8 lb

## 2024-02-19 DIAGNOSIS — C9110 Chronic lymphocytic leukemia of B-cell type not having achieved remission: Secondary | ICD-10-CM | POA: Insufficient documentation

## 2024-02-19 DIAGNOSIS — M85852 Other specified disorders of bone density and structure, left thigh: Secondary | ICD-10-CM | POA: Diagnosis not present

## 2024-02-19 DIAGNOSIS — J309 Allergic rhinitis, unspecified: Secondary | ICD-10-CM | POA: Diagnosis not present

## 2024-02-19 DIAGNOSIS — Z0000 Encounter for general adult medical examination without abnormal findings: Secondary | ICD-10-CM | POA: Diagnosis not present

## 2024-02-19 DIAGNOSIS — I480 Paroxysmal atrial fibrillation: Secondary | ICD-10-CM | POA: Diagnosis not present

## 2024-02-19 DIAGNOSIS — Z6826 Body mass index (BMI) 26.0-26.9, adult: Secondary | ICD-10-CM | POA: Diagnosis not present

## 2024-02-19 DIAGNOSIS — Z7189 Other specified counseling: Secondary | ICD-10-CM | POA: Diagnosis not present

## 2024-02-19 DIAGNOSIS — I10 Essential (primary) hypertension: Secondary | ICD-10-CM | POA: Diagnosis not present

## 2024-02-19 DIAGNOSIS — Z1331 Encounter for screening for depression: Secondary | ICD-10-CM | POA: Diagnosis not present

## 2024-02-19 DIAGNOSIS — E782 Mixed hyperlipidemia: Secondary | ICD-10-CM | POA: Diagnosis not present

## 2024-02-19 DIAGNOSIS — Z01419 Encounter for gynecological examination (general) (routine) without abnormal findings: Secondary | ICD-10-CM | POA: Diagnosis not present

## 2024-02-19 LAB — CBC WITH DIFFERENTIAL/PLATELET
Abs Immature Granulocytes: 0.07 10*3/uL (ref 0.00–0.07)
Basophils Absolute: 0.1 10*3/uL (ref 0.0–0.1)
Basophils Relative: 1 %
Eosinophils Absolute: 0.1 10*3/uL (ref 0.0–0.5)
Eosinophils Relative: 1 %
HCT: 39.1 % (ref 36.0–46.0)
Hemoglobin: 13.2 g/dL (ref 12.0–15.0)
Immature Granulocytes: 1 %
Lymphocytes Relative: 56 %
Lymphs Abs: 8.1 10*3/uL — ABNORMAL HIGH (ref 0.7–4.0)
MCH: 28.6 pg (ref 26.0–34.0)
MCHC: 33.8 g/dL (ref 30.0–36.0)
MCV: 84.6 fL (ref 80.0–100.0)
Monocytes Absolute: 0.6 10*3/uL (ref 0.1–1.0)
Monocytes Relative: 5 %
Neutro Abs: 5.1 10*3/uL (ref 1.7–7.7)
Neutrophils Relative %: 36 %
Platelets: 186 10*3/uL (ref 150–400)
RBC: 4.62 MIL/uL (ref 3.87–5.11)
RDW: 13.7 % (ref 11.5–15.5)
Smear Review: NORMAL
WBC: 14.1 10*3/uL — ABNORMAL HIGH (ref 4.0–10.5)
nRBC: 0 % (ref 0.0–0.2)

## 2024-02-19 LAB — COMPREHENSIVE METABOLIC PANEL WITH GFR
ALT: 15 U/L (ref 0–44)
AST: 16 U/L (ref 15–41)
Albumin: 4.3 g/dL (ref 3.5–5.0)
Alkaline Phosphatase: 59 U/L (ref 38–126)
Anion gap: 6 (ref 5–15)
BUN: 17 mg/dL (ref 8–23)
CO2: 30 mmol/L (ref 22–32)
Calcium: 10.3 mg/dL (ref 8.9–10.3)
Chloride: 106 mmol/L (ref 98–111)
Creatinine, Ser: 0.84 mg/dL (ref 0.44–1.00)
GFR, Estimated: >60 mL/min (ref >60)
Glucose, Bld: 92 mg/dL (ref 70–99)
Potassium: 3.6 mmol/L (ref 3.5–5.1)
Sodium: 142 mmol/L (ref 135–145)
Total Bilirubin: 0.7 mg/dL (ref 0.0–1.2)
Total Protein: 6.8 g/dL (ref 6.5–8.1)

## 2024-02-19 NOTE — Progress Notes (Signed)
 [facility] OFFICE PROGRESS NOTE  Patient Care Team: Gweneth Dimitri, MD as PCP - General (Family Medicine) O'Neal, Ronnald Ramp, MD as PCP - Cardiology (Cardiology) Bjorn Pippin, MD as Attending Physician (Urology) Othella Boyer, MD as Attending Physician (Cardiology) Artis Delay, MD as Consulting Physician (Hematology and Oncology)  Assessment & Plan CLL (chronic lymphocytic leukemia) [facility]) She was diagnosed with chronic lymphocytic leukemia since April 2014 FISH positive for deletion 13 q.  She was treated with Bendamustine and rituximab between October 2018 to January 2019 but relapsed in 2014 She has been on maintenance Calquence since May 2024 CBC is reviewed Her leukocytosis is almost back to normal Once her she has achieved normalization of her white blood cell count, I will order CT imaging to assess for objective assessment of response to therapy  No orders of the defined types were placed in this encounter.    Artis Delay, MD  INTERVAL HISTORY: she returns for treatment follow-up on Calquence for recurrent CLL Complications related to recent chemotherapy included none Specifically, she denies recent infection She has no problems getting her medications refilled We reviewed CBC results and discussed plan of care  PHYSICAL EXAMINATION: ECOG PERFORMANCE STATUS: 0 - Asymptomatic  Vitals:   02/19/24 1237  BP: 137/68  Pulse: 67  Resp: 18  Temp: 97.7 F (36.5 C)  SpO2: 97%   Filed Weights   02/19/24 1237  Weight: 141 lb 12.8 oz (64.3 kg)    Relevant data reviewed during this visit included CBC and CMP

## 2024-02-20 ENCOUNTER — Other Ambulatory Visit: Payer: Self-pay

## 2024-02-23 ENCOUNTER — Other Ambulatory Visit: Payer: Self-pay

## 2024-02-23 ENCOUNTER — Other Ambulatory Visit: Payer: Self-pay | Admitting: Hematology and Oncology

## 2024-02-23 ENCOUNTER — Other Ambulatory Visit (HOSPITAL_COMMUNITY): Payer: Self-pay

## 2024-02-23 DIAGNOSIS — C9110 Chronic lymphocytic leukemia of B-cell type not having achieved remission: Secondary | ICD-10-CM

## 2024-02-23 MED ORDER — CALQUENCE 100 MG PO TABS
100.0000 mg | ORAL_TABLET | Freq: Two times a day (BID) | ORAL | 11 refills | Status: AC
  Filled 2024-02-23: qty 60, 30d supply, fill #0
  Filled 2024-03-22: qty 60, 30d supply, fill #1
  Filled 2024-04-23: qty 60, 30d supply, fill #2
  Filled 2024-05-26: qty 60, 30d supply, fill #3
  Filled 2024-06-18: qty 60, 30d supply, fill #4
  Filled 2024-07-19 (×2): qty 60, 30d supply, fill #5
  Filled 2024-08-27 – 2024-09-29 (×3): qty 60, 30d supply, fill #6
  Filled 2024-10-21: qty 60, 30d supply, fill #7
  Filled 2024-11-19 – 2024-11-22 (×2): qty 60, 30d supply, fill #8

## 2024-02-23 NOTE — Progress Notes (Signed)
 Specialty Pharmacy Refill Coordination Note  Amanda [name] is a 78 y.o. female contacted today regarding refills of specialty medication(s) Acalabrutinib Maleate (Calquence)   Patient requested Delivery   Delivery date: 03/01/24   Verified address: [address]   [place] [id number]   Medication will be filled on 04.18.25.

## 2024-02-27 ENCOUNTER — Other Ambulatory Visit: Payer: Self-pay

## 2024-03-03 DIAGNOSIS — I10 Essential (primary) hypertension: Secondary | ICD-10-CM | POA: Diagnosis not present

## 2024-03-03 DIAGNOSIS — I480 Paroxysmal atrial fibrillation: Secondary | ICD-10-CM | POA: Diagnosis not present

## 2024-03-04 DIAGNOSIS — Z23 Encounter for immunization: Secondary | ICD-10-CM | POA: Diagnosis not present

## 2024-03-08 ENCOUNTER — Other Ambulatory Visit: Payer: Self-pay | Admitting: Family Medicine

## 2024-03-08 DIAGNOSIS — Z1231 Encounter for screening mammogram for malignant neoplasm of breast: Secondary | ICD-10-CM

## 2024-03-10 ENCOUNTER — Other Ambulatory Visit (HOSPITAL_COMMUNITY): Payer: Self-pay

## 2024-03-10 DIAGNOSIS — I10 Essential (primary) hypertension: Secondary | ICD-10-CM | POA: Diagnosis not present

## 2024-03-10 DIAGNOSIS — I480 Paroxysmal atrial fibrillation: Secondary | ICD-10-CM | POA: Diagnosis not present

## 2024-03-10 DIAGNOSIS — E782 Mixed hyperlipidemia: Secondary | ICD-10-CM | POA: Diagnosis not present

## 2024-03-10 NOTE — Progress Notes (Signed)
 [facility] - Clinic Note  03/17/2024    CHIEF COMPLAINT Patient presents for Retina Follow Up  HISTORY OF PRESENT ILLNESS: Amanda [name] is a 78 y.o. female who presents to the clinic today for:  HPI     Retina Follow Up   Patient presents with  Wet AMD.  In left eye.  Duration of 5 weeks.  Since onset it is stable.  I, the attending physician,  performed the HPI with the patient and updated documentation appropriately.        Comments   Patient feels the vision has not changed. She is using AT's.       Last edited by Ronelle Coffee, MD on 03/17/2024  5:20 PM.    Patient states   Referring physician: Franky Ivanoff, PA-C [facility] [address] [place],  [place] [address]  HISTORICAL INFORMATION:  Selected notes from the MEDICAL RECORD NUMBER Referred by Franky Ivanoff, PA on 11.16.21 for eval of dry to wet AMD conversion OS.   CURRENT MEDICATIONS: No current outpatient medications on file. (Ophthalmic Drugs)   No current facility-administered medications for this visit. (Ophthalmic Drugs)   Current Outpatient Medications (Other)  Medication Sig   acalabrutinib  maleate (CALQUENCE ) 100 MG tablet Take 1 tablet (100 mg total) by mouth 2 (two) times daily.   acetaminophen  (TYLENOL ) 500 MG tablet Take 500 mg by mouth every 6 (six) hours as needed for pain.   acyclovir  (ZOVIRAX ) 400 MG tablet Take 1 tablet (400 mg total) by mouth daily.   amLODipine (NORVASC) 5 MG tablet Take 5 mg by mouth daily.   atorvastatin (LIPITOR) 40 MG tablet Take 1 tablet by mouth daily.   Calcium Carb-Cholecalciferol (CALCIUM + D3) 600-200 MG-UNIT TABS Take 1 tablet by mouth 3 (three) times a week.    fluticasone (FLONASE) 50 MCG/ACT nasal spray Place 2 sprays into the nose daily.   folic acid (FOLVITE) 1 MG tablet Take 1 mg by mouth daily.   hydrochlorothiazide (HYDRODIURIL) 25 MG tablet Take 25 mg by mouth daily.   Multiple Vitamin (MULTIVITAMIN)  tablet Take 1 tablet by mouth daily.   Multiple Vitamins-Minerals (EYE VITAMINS & MINERALS) TABS Take by mouth.   potassium chloride SA (K-DUR,KLOR-CON) 20 MEQ tablet Take 20 mEq by mouth 3 (three) times a week. Mon, Wed, Fri   No current facility-administered medications for this visit. (Other)   REVIEW OF SYSTEMS: ROS   Positive for: Cardiovascular, Eyes Negative for: Constitutional, Gastrointestinal, Neurological, Skin, Genitourinary, Musculoskeletal, HENT, Endocrine, Respiratory, Psychiatric, Allergic/Imm, Heme/Lymph Last edited by Olene Berne, COT on 03/17/2024  1:38 PM.     ALLERGIES Allergies  Allergen Reactions   Codeine Nausea And Vomiting   Demerol [Meperidine] Nausea And Vomiting   Morphine And Codeine Nausea And Vomiting   Ciprofloxacin Rash   Sulfa Antibiotics Rash   PAST MEDICAL HISTORY Past Medical History:  Diagnosis Date   A-fib (HCC)    Adenopathy    Atrial fibrillation (HCC)    CLL (chronic lymphocytic leukemia) (HCC) 08/31/2013   HTN (hypertension)    Hyperlipidemia    Hypertensive retinopathy    Lymphocytosis    Macular degeneration    OU   Past Surgical History:  Procedure Laterality Date   ABDOMINAL HYSTERECTOMY     endometriosis, fibroid   APPENDECTOMY     BREAST BIOPSY     benign cyst.   CATARACT EXTRACTION Bilateral 2019   CHOLECYSTECTOMY     COLONOSCOPY  11/2012   Per Dr. Dellis Fermo.  neg.    EYE SURGERY     IR FLUORO GUIDE PORT INSERTION RIGHT  08/12/2017   IR REMOVAL TUN ACCESS W/ PORT W/O FL MOD SED  01/29/2018   IR US  GUIDE VASC ACCESS RIGHT  08/12/2017   SQUAMOUS CELL CARCINOMA EXCISION  12/28/2020   Excised from top of head   THUMB ARTHROSCOPY     TOTAL HIP ARTHROPLASTY  2010   FAMILY HISTORY Family History  Problem Relation Age of Onset   Renal Disease Mother    Cancer Mother        colon cancer   Cancer Father        colon cancer   SOCIAL HISTORY Social History   Tobacco Use   Smoking status: Never   Smokeless  tobacco: Never  Vaping Use   Vaping status: Never Used  Substance Use Topics   Alcohol use: No   Drug use: No       OPHTHALMIC EXAM: Base Eye Exam     Visual Acuity (Snellen - Linear)       Right Left   Dist cc 20/20 +2 20/20 +2    Correction: Glasses         Tonometry (Tonopen, 1:41 PM)       Right Left   Pressure 13 12         Pupils       Dark Light Shape React APD   Right 3 2 Round Brisk None   Left 3 2 Round Brisk None         Visual Fields       Left Right    Full Full         Extraocular Movement       Right Left    Full, Ortho Full, Ortho         Neuro/Psych     Oriented x3: Yes   Mood/Affect: Normal         Dilation     Both eyes: 1.0% Mydriacyl, 2.5% Phenylephrine @ 1:38 PM           Slit Lamp and Fundus Exam     Slit Lamp Exam       Right Left   Lids/Lashes Dermatochalasis - upper lid, mild Meibomian gland dysfunction Dermatochalasis - upper lid, mild Meibomian gland dysfunction   Conjunctiva/Sclera White and quiet White and quiet   Cornea Trace Debris in tear film, well healed temporal cataract wounds, 1+ pigmented Guttata, trace PEE well healed temporal cataract wounds, 2+ Punctate epithelial erosions   Anterior Chamber Deep and quiet Deep and quiet   Iris Round and dilated Round and dilated   Lens PC IOL in good position, trace PCO PC IOL in good position   Anterior Vitreous Trace Vitreous syneresis, Posterior vitreous detachment Trace Vitreous syneresis, Posterior vitreous detachment         Fundus Exam       Right Left   Disc Compact, trace pallor, Sharp rim, mild PPA Compact, Pink and Sharp, temporal PPP   C/D Ratio 0.1 0.2   Macula Blunted foveal reflex, Drusen, RPE mottling, clumping and early atrophy, shallow PED with trace SRF -- stably improved Blunted foveal reflex, +CNV, Drusen, RPE mottling and clumping; trace shallow SRF -- stably improved, no heme   Vessels attenuated, Tortuous attenuated,  Tortuous   Periphery Attached; mild reticular degeneration, no heme Attached, reticular degeneration, No heme  Refraction     Wearing Rx       Sphere Cylinder Axis Add   Right -0.25 +0.75 165 +2.50   Left -0.50 +0.50 012 +2.50    Type: PAL           IMAGING AND PROCEDURES  Imaging and Procedures for 03/17/2024  OCT, Retina - OU - Both Eyes       Right Eye Quality was good. Central Foveal Thickness: 262. Progression has been stable. Findings include normal foveal contour, no IRF, no SRF, retinal drusen , subretinal hyper-reflective material, intraretinal hyper-reflective material, pigment epithelial detachment, outer retinal atrophy (stable improvement in shallow SRF centrally overlying stable low PED, no IRF).   Left Eye Quality was good. Central Foveal Thickness: 286. Progression has been stable. Findings include no IRF, no SRF, abnormal foveal contour, retinal drusen , subretinal hyper-reflective material, choroidal neovascular membrane, pigment epithelial detachment, outer retinal atrophy (Stable resolution of SRF overlying PED).   Notes *Images captured and stored on drive  Diagnosis / Impression:  OD: exu ARMD -- stable improvement in shallow SRF centrally overlying stable low PED, no IRF OS: exu ARMD -- stable resolution of SRF overlying stable PED   Clinical management:  See below  Abbreviations: NFP - Normal foveal profile. CME - cystoid macular edema. PED - pigment epithelial detachment. IRF - intraretinal fluid. SRF - subretinal fluid. EZ - ellipsoid zone. ERM - epiretinal membrane. ORA - outer retinal atrophy. ORT - outer retinal tubulation. SRHM - subretinal hyper-reflective material. IRHM - intraretinal hyper-reflective material      Intravitreal Injection, Pharmacologic Agent - OD - Right Eye       Time Out 03/17/2024. 2:51 PM. Confirmed correct patient, procedure, site, and patient consented.   Anesthesia Topical anesthesia was used.  Anesthetic medications included Lidocaine  2%, Proparacaine 0.5%.   Procedure Preparation included 5% betadine to ocular surface, eyelid speculum. A supplied (32g) needle was used.   Injection: 6 mg faricimab -svoa 6 MG/0.05ML   Route: Intravitreal, Site: Right Eye   NDC: 32440-102-72, Lot: [id number], Expiration date: 03/10/2025, Waste: 0 mL   Post-op Post injection exam found visual acuity of at least counting fingers. The patient tolerated the procedure well. There were no complications. The patient received written and verbal post procedure care education. Post injection medications were not given.      Intravitreal Injection, Pharmacologic Agent - OS - Left Eye       Time Out 03/17/2024. 2:52 PM. Confirmed correct patient, procedure, site, and patient consented.   Anesthesia Topical anesthesia was used. Anesthetic medications included Lidocaine  2%, Proparacaine 0.5%.   Procedure Preparation included 5% betadine to ocular surface, eyelid speculum. A supplied (32g) needle was used.   Injection: 6 mg faricimab -svoa 6 MG/0.05ML   Route: Intravitreal, Site: Left Eye   NDC: 47425-956-38, Lot: [id number], Expiration date: 03/10/2025, Waste: 0 mL   Post-op Post injection exam found visual acuity of at least counting fingers. The patient tolerated the procedure well. There were no complications. The patient received written and verbal post procedure care education.            ASSESSMENT/PLAN:    ICD-10-CM   1. Exudative age-related macular degeneration of left eye with active choroidal neovascularization (HCC)  H35.3221 OCT, Retina - OU - Both Eyes    Intravitreal Injection, Pharmacologic Agent - OS - Left Eye    faricimab -svoa (VABYSMO ) 6mg /0.49mL intravitreal injection    2. Exudative age-related macular degeneration of right eye with active choroidal neovascularization (HCC)  H35.3211 OCT, Retina - OU - Both Eyes    Intravitreal Injection, Pharmacologic Agent - OD - Right Eye     faricimab -svoa (VABYSMO ) 6mg /0.15mL intravitreal injection    3. Essential hypertension  I10     4. Hypertensive retinopathy of both eyes  H35.033     5. Pseudophakia, both eyes  Z96.1      1. Exudative age related macular degeneration, OS  - [facility] pt with known history of nonexudative ARMD OU - acute "haze over vision" OS -- onset, Friday 11.12.21 -- initial exam with central subretinal heme - s/p IVA OS #1 (11.17.21), #2 (12.15.21), #3 (01.12.22), #4 (02.09.22), #5 (03.16.22), #6 (04.20.22), #7 (06.01.22), #8 (06.29.22) -- IVA resistance ========================= - s/p IVE OS #1 (07.27.22), #2 (08.24.22), #3 (09.21.22), #4 (10.19.22), #5 (11.16.22), #6 (12.16.22), #7 (01.18.23), #8 (02.15.23) -- IVE resistance ========================= - s/p IVV OS #1 SAMPLE (03.15.23), #2 (04.12.23), #3 (05.10.23), #4 (06.07.23), #5 (07.19.23), #6 (08.22.23), #7 (09.27.23), #8 (12.20.23), #9 (01.31.24), #10 (03.13.24), #11 (04.24.24), #12 (05.31.24), #13 (07.09.24), #14 (08.21.24), #15 (10.02.24), #16 (11.13.24), #17 (12.18.24), #18 (01.22.25), #19 (02.26.25), #20 (04.02.25)  **h/o increased fluid at 6 wks on 11.13.24**  - BCVA OS 20/20 - stable - exam and OCT show OS: stable resolution of SRF overlying stable PED at 5 wks  - recommend IVV OS #21 today, 05.07.25 with follow up in 6 weeks -- due to MD being out of the office in 5 weeks - pt wishes to be treated with IVV OS - RBA of procedure discussed, questions answered - IVV informed consent obtained and signed 01.22.25 (OS) - see procedure note   - f/u 6 weeks -- DFE/OCT, possible injections  2. Exudative age related macular degeneration OD  - conversion to exudative ARMD noted on 03.15.23 exam  - exam with new SRH temporal macula -- stably improved today - s/p IVE OD #1 (03.15.23), #2 (04.12.23), #3 (05.10.23), #4 (06.07.23), #5 (07.19.23), #6 (08.19.23), #7 (09.27.23), #8 (11.01.23), #9 (03.11.24), #10 (07.09.24), #11 (08.21.24), #12  (10.02.24), #13 (11.13.24), #14 (12.18.24) -- IVE resistance - s/p IVV OD #1 (01.22.25), #2 (02.26.25), #3 (04.02.25)  **h/o increased fluid at 6 wks on 11.13.24**  - BCVA OD improved to 20/20 from 20/25 - OCT OD: stable improvement in shallow SRF centrally overlying stable low PED, no IRF at 5 weeks - recommend IVV OD #4 today, 05.07.25 w/ f/u in 6 wks -- due to MD being out of the office in 5 weeks  - pt in agreement  - RBA of procedure discussed, questions answered - see procedure note - IVV informed consent obtained and signed , 01.25.25 (OU)  - f/u 6 weeks -- DFE/OCT, possible injxns  3,4. Hypertensive retinopathy OU - discussed importance of tight BP control - monitor     5. Pseudophakia OU  - s/p CE/IOL OU  - IOLs in good position, doing well  - monitor   Ophthalmic Meds Ordered this visit:  Meds ordered this encounter  Medications   faricimab -svoa (VABYSMO ) 6mg /0.8mL intravitreal injection   faricimab -svoa (VABYSMO ) 6mg /0.38mL intravitreal injection     Return in about 6 weeks (around 04/28/2024) for f/u exu ARMD OU, DFE, OCT, Possible Injxn.  There are no Patient Instructions on file for this visit.  This document serves as a record of services personally performed by Jeanice Millard, MD, PhD. It was created on their behalf by Angelia Kelp, an ophthalmic technician. The creation of this record is the provider's dictation and/or activities during  the visit.    Electronically signed by: Angelia Kelp, OA, 03/17/24  5:23 PM  This document serves as a record of services personally performed by Jeanice Millard, MD, PhD. It was created on their behalf by Morley Arabia. Bevin Bucks, OA an ophthalmic technician. The creation of this record is the provider's dictation and/or activities during the visit.    Electronically signed by: Morley Arabia. Bevin Bucks, OA 03/17/24 5:23 PM  Jeanice Millard, M.D., Ph.D. Diseases & Surgery of the Retina and Vitreous [facility]  I have reviewed the above documentation for accuracy and completeness, and I agree with the above. Jeanice Millard, M.D., Ph.D. 03/17/24 5:24 PM   Abbreviations: M myopia (nearsighted); A astigmatism; H hyperopia (farsighted); P presbyopia; Mrx spectacle prescription;  CTL contact lenses; OD right eye; OS left eye; OU both eyes  XT exotropia; ET esotropia; PEK punctate epithelial keratitis; PEE punctate epithelial erosions; DES dry eye syndrome; MGD meibomian gland dysfunction; ATs artificial tears; PFAT's preservative free artificial tears; NSC nuclear sclerotic cataract; PSC posterior subcapsular cataract; ERM epi-retinal membrane; PVD posterior vitreous detachment; RD retinal detachment; DM diabetes mellitus; DR diabetic retinopathy; NPDR non-proliferative diabetic retinopathy; PDR proliferative diabetic retinopathy; CSME clinically significant macular edema; DME diabetic macular edema; dbh dot blot hemorrhages; CWS cotton wool spot; POAG primary open angle glaucoma; C/D cup-to-disc ratio; HVF humphrey visual field; GVF goldmann visual field; OCT optical coherence tomography; IOP intraocular pressure; BRVO Branch retinal vein occlusion; CRVO central retinal vein occlusion; CRAO central retinal artery occlusion; BRAO branch retinal artery occlusion; RT retinal tear; SB scleral buckle; PPV pars plana vitrectomy; VH Vitreous hemorrhage; PRP panretinal laser photocoagulation; IVK intravitreal kenalog ; VMT vitreomacular traction; MH Macular hole;  NVD neovascularization of the disc; NVE neovascularization elsewhere; AREDS age related eye disease study; ARMD age related macular degeneration; POAG primary open angle glaucoma; EBMD epithelial/anterior basement membrane dystrophy; ACIOL anterior chamber intraocular lens; IOL intraocular lens; PCIOL posterior chamber intraocular lens; Phaco/IOL phacoemulsification with intraocular lens placement; PRK photorefractive keratectomy; LASIK laser assisted in situ  keratomileusis; HTN hypertension; DM diabetes mellitus; COPD chronic obstructive pulmonary disease

## 2024-03-17 ENCOUNTER — Ambulatory Visit (INDEPENDENT_AMBULATORY_CARE_PROVIDER_SITE_OTHER): Admitting: Ophthalmology

## 2024-03-17 ENCOUNTER — Encounter (INDEPENDENT_AMBULATORY_CARE_PROVIDER_SITE_OTHER): Payer: Self-pay | Admitting: Ophthalmology

## 2024-03-17 DIAGNOSIS — Z961 Presence of intraocular lens: Secondary | ICD-10-CM | POA: Diagnosis not present

## 2024-03-17 DIAGNOSIS — H353231 Exudative age-related macular degeneration, bilateral, with active choroidal neovascularization: Secondary | ICD-10-CM

## 2024-03-17 DIAGNOSIS — H353211 Exudative age-related macular degeneration, right eye, with active choroidal neovascularization: Secondary | ICD-10-CM

## 2024-03-17 DIAGNOSIS — I10 Essential (primary) hypertension: Secondary | ICD-10-CM

## 2024-03-17 DIAGNOSIS — H35033 Hypertensive retinopathy, bilateral: Secondary | ICD-10-CM

## 2024-03-17 DIAGNOSIS — H353221 Exudative age-related macular degeneration, left eye, with active choroidal neovascularization: Secondary | ICD-10-CM

## 2024-03-17 MED ORDER — FARICIMAB-SVOA 6 MG/0.05ML IZ SOSY
6.0000 mg | PREFILLED_SYRINGE | INTRAVITREAL | Status: AC | PRN
  Administered 2024-03-17: 6 mg via INTRAVITREAL

## 2024-03-19 ENCOUNTER — Encounter (HOSPITAL_COMMUNITY): Payer: Self-pay

## 2024-03-22 ENCOUNTER — Other Ambulatory Visit (HOSPITAL_COMMUNITY): Payer: Self-pay

## 2024-03-22 NOTE — Progress Notes (Signed)
 Specialty Pharmacy Ongoing Clinical Assessment Note  Amanda [name] is a 78 y.o. female who is being followed by the specialty pharmacy service for RxSp Oncology   Patient's specialty medication(s) reviewed today: Acalabrutinib  Maleate (Calquence )   Missed doses in the last 4 weeks: 1 (forgot)   Patient/Caregiver did not have any additional questions or concerns.   Therapeutic benefit summary: Patient is achieving benefit   Adverse events/side effects summary: Experienced adverse events/side effects (hard stools, patient has added on fiber and stool softeners, tolerable at this time)   Patient's therapy is appropriate to: Continue    Goals Addressed             This Visit's Progress    Slow Disease Progression   On track    Patient is on track. Patient will maintain adherence. Per provider notes from 02/19/24, patient's leukocytosis is almost back to normal and a CT is being ordered to determine response to therapy.         Follow up: 6 months  Malachi Screws Specialty Pharmacist

## 2024-03-22 NOTE — Progress Notes (Signed)
 Specialty Pharmacy Refill Coordination Note  Amanda [name] is a 78 y.o. female contacted today regarding refills of specialty medication(s) Acalabrutinib  Maleate (Calquence )   Patient requested Delivery   Delivery date: 03/30/24   Verified address: [address]   [place] [address]   Medication will be filled on 03/29/24.

## 2024-03-26 ENCOUNTER — Ambulatory Visit
Admission: RE | Admit: 2024-03-26 | Discharge: 2024-03-26 | Disposition: A | Discharge status: Home / Self Care | Source: Ambulatory Visit | Attending: Family Medicine | Admitting: Family Medicine

## 2024-03-26 DIAGNOSIS — Z1231 Encounter for screening mammogram for malignant neoplasm of breast: Secondary | ICD-10-CM

## 2024-04-02 DIAGNOSIS — I480 Paroxysmal atrial fibrillation: Secondary | ICD-10-CM | POA: Diagnosis not present

## 2024-04-02 DIAGNOSIS — I10 Essential (primary) hypertension: Secondary | ICD-10-CM | POA: Diagnosis not present

## 2024-04-10 DIAGNOSIS — I10 Essential (primary) hypertension: Secondary | ICD-10-CM | POA: Diagnosis not present

## 2024-04-10 DIAGNOSIS — E782 Mixed hyperlipidemia: Secondary | ICD-10-CM | POA: Diagnosis not present

## 2024-04-10 DIAGNOSIS — I480 Paroxysmal atrial fibrillation: Secondary | ICD-10-CM | POA: Diagnosis not present

## 2024-04-12 ENCOUNTER — Other Ambulatory Visit (HOSPITAL_COMMUNITY): Payer: Self-pay

## 2024-04-20 NOTE — Progress Notes (Addendum)
 [facility] - Clinic Note  04/28/2024    CHIEF COMPLAINT Patient presents for Retina Follow Up  HISTORY OF PRESENT ILLNESS: Amanda [name] is a 78 y.o. female who presents to the clinic today for:  HPI     Retina Follow Up   Patient presents with  Wet AMD.  In both eyes.  This started 6 weeks ago.  Duration of weeks.  Since onset it is stable.  I, the attending physician,  performed the HPI with the patient and updated documentation appropriately.        Comments   6 week retina follow up ARMD OU and IVV OU pt is reporting no vision changes noticed she has some floaters denies any flashes      Last edited by Ronelle Coffee, MD on 04/28/2024  5:05 PM.     Patient states   Referring physician: Franky Ivanoff, PA-C [facility] [address] [place],  [place] [address]  HISTORICAL INFORMATION:  Selected notes from the MEDICAL RECORD NUMBER Referred by Franky Ivanoff, PA on 11.16.21 for eval of dry to wet AMD conversion OS.   CURRENT MEDICATIONS: No current outpatient medications on file. (Ophthalmic Drugs)   No current facility-administered medications for this visit. (Ophthalmic Drugs)   Current Outpatient Medications (Other)  Medication Sig   acalabrutinib  maleate (CALQUENCE ) 100 MG tablet Take 1 tablet (100 mg total) by mouth 2 (two) times daily.   acetaminophen  (TYLENOL ) 500 MG tablet Take 500 mg by mouth every 6 (six) hours as needed for pain.   acyclovir  (ZOVIRAX ) 400 MG tablet Take 1 tablet (400 mg total) by mouth daily.   amLODipine (NORVASC) 5 MG tablet Take 5 mg by mouth daily.   atorvastatin (LIPITOR) 40 MG tablet Take 1 tablet by mouth daily.   Calcium Carb-Cholecalciferol (CALCIUM + D3) 600-200 MG-UNIT TABS Take 1 tablet by mouth 3 (three) times a week.    fluticasone (FLONASE) 50 MCG/ACT nasal spray Place 2 sprays into the nose daily.   folic acid (FOLVITE) 1 MG tablet Take 1 mg by mouth daily.    hydrochlorothiazide (HYDRODIURIL) 25 MG tablet Take 25 mg by mouth daily.   Multiple Vitamin (MULTIVITAMIN) tablet Take 1 tablet by mouth daily.   Multiple Vitamins-Minerals (EYE VITAMINS & MINERALS) TABS Take by mouth.   potassium chloride SA (K-DUR,KLOR-CON) 20 MEQ tablet Take 20 mEq by mouth 3 (three) times a week. Mon, Wed, Fri   No current facility-administered medications for this visit. (Other)   REVIEW OF SYSTEMS: ROS   Positive for: Cardiovascular, Eyes Negative for: Constitutional, Gastrointestinal, Neurological, Skin, Genitourinary, Musculoskeletal, HENT, Endocrine, Respiratory, Psychiatric, Allergic/Imm, Heme/Lymph Last edited by Alise Appl, COT on 04/28/2024  1:30 PM.      ALLERGIES Allergies  Allergen Reactions   Codeine Nausea And Vomiting   Demerol [Meperidine] Nausea And Vomiting   Morphine And Codeine Nausea And Vomiting   Ciprofloxacin Rash   Sulfa Antibiotics Rash   PAST MEDICAL HISTORY Past Medical History:  Diagnosis Date   A-fib [facility])    Adenopathy    Atrial fibrillation (HCC)    CLL (chronic lymphocytic leukemia) (HCC) 08/31/2013   HTN (hypertension)    Hyperlipidemia    Hypertensive retinopathy    Lymphocytosis    Macular degeneration    OU   Past Surgical History:  Procedure Laterality Date   ABDOMINAL HYSTERECTOMY     endometriosis, fibroid   APPENDECTOMY     BREAST BIOPSY  benign cyst.   CATARACT EXTRACTION Bilateral 2019   CHOLECYSTECTOMY     COLONOSCOPY  11/2012   Per Dr. Dellis Fermo.  neg.    EYE SURGERY     IR FLUORO GUIDE PORT INSERTION RIGHT  08/12/2017   IR REMOVAL TUN ACCESS W/ PORT W/O FL MOD SED  01/29/2018   IR US  GUIDE VASC ACCESS RIGHT  08/12/2017   SQUAMOUS CELL CARCINOMA EXCISION  12/28/2020   Excised from top of head   THUMB ARTHROSCOPY     TOTAL HIP ARTHROPLASTY  2010   FAMILY HISTORY Family History  Problem Relation Age of Onset   Renal Disease Mother    Cancer Mother        colon cancer   Cancer  Father        colon cancer   SOCIAL HISTORY Social History   Tobacco Use   Smoking status: Never   Smokeless tobacco: Never  Vaping Use   Vaping status: Never Used  Substance Use Topics   Alcohol use: No   Drug use: No       OPHTHALMIC EXAM: Base Eye Exam     Visual Acuity (Snellen - Linear)       Right Left   Dist cc 20/20 20/20 -1    Correction: Glasses         Tonometry (Tonopen, 1:35 PM)       Right Left   Pressure 16 17         Pupils       Pupils Dark Light Shape React APD   Right PERRL 3 2 Round Brisk None   Left PERRL 3 2 Round Brisk None         Visual Fields       Left Right    Full Full         Extraocular Movement       Right Left    Full, Ortho Full, Ortho         Neuro/Psych     Oriented x3: Yes   Mood/Affect: Normal         Dilation     Both eyes: 2.5% Phenylephrine @ 1:38 PM           Slit Lamp and Fundus Exam     Slit Lamp Exam       Right Left   Lids/Lashes Dermatochalasis - upper lid, mild Meibomian gland dysfunction Dermatochalasis - upper lid, mild Meibomian gland dysfunction   Conjunctiva/Sclera White and quiet White and quiet   Cornea Trace Debris in tear film, well healed temporal cataract wounds, 1+ pigmented Guttata, trace PEE well healed temporal cataract wounds, 2+ Punctate epithelial erosions   Anterior Chamber Deep and quiet Deep and quiet   Iris Round and dilated Round and dilated   Lens PC IOL in good position, trace PCO PC IOL in good position   Anterior Vitreous Trace Vitreous syneresis, Posterior vitreous detachment Trace Vitreous syneresis, Posterior vitreous detachment         Fundus Exam       Right Left   Disc Compact, trace pallor, Sharp rim, mild PPA Compact, Pink and Sharp, temporal PPP   C/D Ratio 0.1 0.2   Macula Blunted foveal reflex, Drusen, RPE mottling, clumping and early atrophy, shallow PED with trace SRF -- stably improved Blunted foveal reflex, +CNV, Drusen, RPE  mottling and clumping; trace shallow SRF -- stably improved, no heme   Vessels attenuated, Tortuous attenuated, Tortuous   Periphery Attached; mild  reticular degeneration, no heme Attached, reticular degeneration, No heme           Refraction     Wearing Rx       Sphere Cylinder Axis Add   Right -0.25 +0.75 165 +2.50   Left -0.50 +0.50 012 +2.50    Type: PAL           IMAGING AND PROCEDURES  Imaging and Procedures for 04/28/2024  OCT, Retina - OU - Both Eyes       Right Eye Quality was good. Central Foveal Thickness: 253. Progression has been stable. Findings include normal foveal contour, no IRF, no SRF, retinal drusen , subretinal hyper-reflective material, intraretinal hyper-reflective material, pigment epithelial detachment, outer retinal atrophy (stable improvement in shallow SRF centrally overlying stable low PED, no IRF).   Left Eye Quality was good. Central Foveal Thickness: 312. Progression has been stable. Findings include no IRF, no SRF, abnormal foveal contour, retinal drusen , subretinal hyper-reflective material, choroidal neovascular membrane, pigment epithelial detachment, outer retinal atrophy (Stable resolution of SRF overlying PED).   Notes *Images captured and stored on drive  Diagnosis / Impression:  OD: exu ARMD -- stable improvement in shallow SRF centrally overlying stable low PED, no IRF OS: exu ARMD -- stable resolution of SRF overlying stable PED   Clinical management:  See below  Abbreviations: NFP - Normal foveal profile. CME - cystoid macular edema. PED - pigment epithelial detachment. IRF - intraretinal fluid. SRF - subretinal fluid. EZ - ellipsoid zone. ERM - epiretinal membrane. ORA - outer retinal atrophy. ORT - outer retinal tubulation. SRHM - subretinal hyper-reflective material. IRHM - intraretinal hyper-reflective material      Intravitreal Injection, Pharmacologic Agent - OD - Right Eye       Time Out 04/28/2024. 2:46 PM.  Confirmed correct patient, procedure, site, and patient consented.   Anesthesia Topical anesthesia was used. Anesthetic medications included Lidocaine  2%, Proparacaine 0.5%.   Procedure Preparation included 5% betadine to ocular surface, eyelid speculum. A supplied (32g) needle was used.   Injection: 6 mg faricimab -svoa 6 MG/0.05ML   Route: Intravitreal, Site: Right Eye   NDC: 16109-604-54, Lot: [id number], Expiration date: 03/10/2025, Waste: 0 mL   Post-op Post injection exam found visual acuity of at least counting fingers. The patient tolerated the procedure well. There were no complications. The patient received written and verbal post procedure care education. Post injection medications were not given.      Intravitreal Injection, Pharmacologic Agent - OS - Left Eye       Time Out 04/28/2024. 2:46 PM. Confirmed correct patient, procedure, site, and patient consented.   Anesthesia Topical anesthesia was used. Anesthetic medications included Lidocaine  2%, Proparacaine 0.5%.   Procedure Preparation included 5% betadine to ocular surface, eyelid speculum. A supplied (32g) needle was used.   Injection: 6 mg faricimab -svoa 6 MG/0.05ML   Route: Intravitreal, Site: Left Eye   NDC: 78295-621-30, Lot: [id number], Expiration date: 03/10/2025, Waste: 0 mL   Post-op Post injection exam found visual acuity of at least counting fingers. The patient tolerated the procedure well. There were no complications. The patient received written and verbal post procedure care education.             ASSESSMENT/PLAN:    ICD-10-CM   1. Exudative age-related macular degeneration of left eye with active choroidal neovascularization (HCC)  H35.3221 OCT, Retina - OU - Both Eyes    Intravitreal Injection, Pharmacologic Agent - OS - Left Eye    faricimab -svoa (VABYSMO )  6mg /0.6mL intravitreal injection    2. Exudative age-related macular degeneration of right eye with active choroidal  neovascularization (HCC)  H35.3211 OCT, Retina - OU - Both Eyes    Intravitreal Injection, Pharmacologic Agent - OD - Right Eye    faricimab -svoa (VABYSMO ) 6mg /0.41mL intravitreal injection    3. Essential hypertension  I10     4. Hypertensive retinopathy of both eyes  H35.033     5. Pseudophakia, both eyes  Z96.1       1. Exudative age related macular degeneration, OS  - [facility] pt with known history of nonexudative ARMD OU - acute haze over vision OS -- onset, Friday 11.12.21 -- initial exam with central subretinal heme - s/p IVA OS #1 (11.17.21), #2 (12.15.21), #3 (01.12.22), #4 (02.09.22), #5 (03.16.22), #6 (04.20.22), #7 (06.01.22), #8 (06.29.22) -- IVA resistance ========================= - s/p IVE OS #1 (07.27.22), #2 (08.24.22), #3 (09.21.22), #4 (10.19.22), #5 (11.16.22), #6 (12.16.22), #7 (01.18.23), #8 (02.15.23) -- IVE resistance ========================= - s/p IVV OS #1 SAMPLE (03.15.23), #2 (04.12.23), #3 (05.10.23), #4 (06.07.23), #5 (07.19.23), #6 (08.22.23), #7 (09.27.23), #8 (12.20.23), #9 (01.31.24), #10 (03.13.24), #11 (04.24.24), #12 (05.31.24), #13 (07.09.24), #14 (08.21.24), #15 (10.02.24), #16 (11.13.24), #17 (12.18.24), #18 (01.22.25), #19 (02.26.25), #20 (04.02.25), #21 (05.07.25)  **h/o increased fluid at 6 wks on 11.13.24**  - BCVA OS 20/20 - stable - exam and OCT show OS: stable resolution of SRF overlying stable PED at 6 wks  - recommend IVV OS #22 today, 06.18.25 with follow up in 6 weeks - pt wishes to be treated with IVV OS - RBA of procedure discussed, questions answered - IVV informed consent obtained and signed 01.22.25 (OS) - see procedure note   - f/u 6 weeks -- DFE/OCT, possible injections  2. Exudative age related macular degeneration OD  - conversion to exudative ARMD noted on 03.15.23 exam  - exam with new SRH temporal macula -- stably improved today - s/p IVE OD #1 (03.15.23), #2 (04.12.23), #3 (05.10.23), #4 (06.07.23), #5  (07.19.23), #6 (08.19.23), #7 (09.27.23), #8 (11.01.23), #9 (03.11.24), #10 (07.09.24), #11 (08.21.24), #12 (10.02.24), #13 (11.13.24), #14 (12.18.24) -- IVE resistance ============================ - s/p IVV OD #1 (01.22.25), #2 (02.26.25), #3 (04.02.25), #4 (05.07.25) ============================  **h/o increased fluid at 6 wks on 11.13.24**  - BCVA OD improved to 20/20  - OCT OD: stable improvement in shallow SRF centrally overlying stable low PED, no IRF at 6 weeks - recommend IVV OD #5 today, 06.18.25 w/ f/u in 6 wks  - pt in agreement  - RBA of procedure discussed, questions answered - see procedure note - IVV informed consent obtained and signed , 01.25.25 (OU)  - f/u 6 weeks -- DFE/OCT, possible injxns  3,4. Hypertensive retinopathy OU - discussed importance of tight BP control - monitor     5. Pseudophakia OU  - s/p CE/IOL OU  - IOLs in good position, doing well  - monitor   Ophthalmic Meds Ordered this visit:  Meds ordered this encounter  Medications   faricimab -svoa (VABYSMO ) 6mg /0.55mL intravitreal injection   faricimab -svoa (VABYSMO ) 6mg /0.64mL intravitreal injection     Return in about 6 weeks (around 06/09/2024) for f/u exu ARMD OU, DFE, OCT, Possible Injxn.  There are no Patient Instructions on file for this visit.  This document serves as a record of services personally performed by Jeanice Millard, MD, PhD. It was created on their behalf by Angelia Kelp, an ophthalmic technician. The creation of this record is the provider's dictation and/or  activities during the visit.    Electronically signed by: Angelia Kelp, OA, 04/28/24  5:06 PM  This document serves as a record of services personally performed by Jeanice Millard, MD, PhD. It was created on their behalf by Morley Arabia. Bevin Bucks, OA an ophthalmic technician. The creation of this record is the provider's dictation and/or activities during the visit.    Electronically signed by: Morley Arabia. Bevin Bucks, OA 04/28/24  5:06 PM  Jeanice Millard, M.D., Ph.D. Diseases & Surgery of the Retina and Vitreous [facility]  I have reviewed the above documentation for accuracy and completeness, and I agree with the above. Jeanice Millard, M.D., Ph.D. 04/28/24 5:06 PM   Abbreviations: M myopia (nearsighted); A astigmatism; H hyperopia (farsighted); P presbyopia; Mrx spectacle prescription;  CTL contact lenses; OD right eye; OS left eye; OU both eyes  XT exotropia; ET esotropia; PEK punctate epithelial keratitis; PEE punctate epithelial erosions; DES dry eye syndrome; MGD meibomian gland dysfunction; ATs artificial tears; PFAT's preservative free artificial tears; NSC nuclear sclerotic cataract; PSC posterior subcapsular cataract; ERM epi-retinal membrane; PVD posterior vitreous detachment; RD retinal detachment; DM diabetes mellitus; DR diabetic retinopathy; NPDR non-proliferative diabetic retinopathy; PDR proliferative diabetic retinopathy; CSME clinically significant macular edema; DME diabetic macular edema; dbh dot blot hemorrhages; CWS cotton wool spot; POAG primary open angle glaucoma; C/D cup-to-disc ratio; HVF humphrey visual field; GVF goldmann visual field; OCT optical coherence tomography; IOP intraocular pressure; BRVO Branch retinal vein occlusion; CRVO central retinal vein occlusion; CRAO central retinal artery occlusion; BRAO branch retinal artery occlusion; RT retinal tear; SB scleral buckle; PPV pars plana vitrectomy; VH Vitreous hemorrhage; PRP panretinal laser photocoagulation; IVK intravitreal kenalog ; VMT vitreomacular traction; MH Macular hole;  NVD neovascularization of the disc; NVE neovascularization elsewhere; AREDS age related eye disease study; ARMD age related macular degeneration; POAG primary open angle glaucoma; EBMD epithelial/anterior basement membrane dystrophy; ACIOL anterior chamber intraocular lens; IOL intraocular lens; PCIOL posterior chamber intraocular lens; Phaco/IOL  phacoemulsification with intraocular lens placement; PRK photorefractive keratectomy; LASIK laser assisted in situ keratomileusis; HTN hypertension; DM diabetes mellitus; COPD chronic obstructive pulmonary disease

## 2024-04-21 ENCOUNTER — Other Ambulatory Visit: Payer: Self-pay

## 2024-04-23 ENCOUNTER — Other Ambulatory Visit (HOSPITAL_COMMUNITY): Payer: Self-pay

## 2024-04-23 ENCOUNTER — Other Ambulatory Visit: Payer: Self-pay

## 2024-04-23 NOTE — Progress Notes (Signed)
 Specialty Pharmacy Refill Coordination Note  Spoke with Amanda [name] (Self).   Amanda [name] is a 78 y.o. female contacted today regarding refills of specialty medication(s) Acalabrutinib  Maleate (Calquence )  Patient requested Delivery   Delivery date: 04/27/24   Verified address: [address]  [name] [address]  Medication will be filled on 04/26/24.

## 2024-04-28 ENCOUNTER — Ambulatory Visit (INDEPENDENT_AMBULATORY_CARE_PROVIDER_SITE_OTHER): Admitting: Ophthalmology

## 2024-04-28 ENCOUNTER — Encounter (INDEPENDENT_AMBULATORY_CARE_PROVIDER_SITE_OTHER): Payer: Self-pay | Admitting: Ophthalmology

## 2024-04-28 DIAGNOSIS — Z961 Presence of intraocular lens: Secondary | ICD-10-CM

## 2024-04-28 DIAGNOSIS — I10 Essential (primary) hypertension: Secondary | ICD-10-CM

## 2024-04-28 DIAGNOSIS — H353211 Exudative age-related macular degeneration, right eye, with active choroidal neovascularization: Secondary | ICD-10-CM

## 2024-04-28 DIAGNOSIS — H353221 Exudative age-related macular degeneration, left eye, with active choroidal neovascularization: Secondary | ICD-10-CM

## 2024-04-28 DIAGNOSIS — H353231 Exudative age-related macular degeneration, bilateral, with active choroidal neovascularization: Secondary | ICD-10-CM

## 2024-04-28 DIAGNOSIS — H35033 Hypertensive retinopathy, bilateral: Secondary | ICD-10-CM | POA: Diagnosis not present

## 2024-04-28 MED ORDER — FARICIMAB-SVOA 6 MG/0.05ML IZ SOSY
6.0000 mg | PREFILLED_SYRINGE | INTRAVITREAL | Status: AC | PRN
  Administered 2024-04-28: 6 mg via INTRAVITREAL

## 2024-05-02 DIAGNOSIS — I480 Paroxysmal atrial fibrillation: Secondary | ICD-10-CM | POA: Diagnosis not present

## 2024-05-02 DIAGNOSIS — I10 Essential (primary) hypertension: Secondary | ICD-10-CM | POA: Diagnosis not present

## 2024-05-10 DIAGNOSIS — I480 Paroxysmal atrial fibrillation: Secondary | ICD-10-CM | POA: Diagnosis not present

## 2024-05-10 DIAGNOSIS — E782 Mixed hyperlipidemia: Secondary | ICD-10-CM | POA: Diagnosis not present

## 2024-05-10 DIAGNOSIS — I10 Essential (primary) hypertension: Secondary | ICD-10-CM | POA: Diagnosis not present

## 2024-05-12 ENCOUNTER — Other Ambulatory Visit (HOSPITAL_COMMUNITY): Payer: Self-pay

## 2024-05-12 ENCOUNTER — Other Ambulatory Visit: Payer: Self-pay | Admitting: Hematology and Oncology

## 2024-05-12 MED ORDER — ACYCLOVIR 400 MG PO TABS
400.0000 mg | ORAL_TABLET | Freq: Every day | ORAL | 6 refills | Status: DC
  Filled 2024-05-12: qty 30, 30d supply, fill #0
  Filled 2024-06-08: qty 30, 30d supply, fill #1
  Filled 2024-07-08: qty 30, 30d supply, fill #2
  Filled 2024-08-06: qty 30, 30d supply, fill #3
  Filled 2024-09-02 – 2024-09-03 (×3): qty 30, 30d supply, fill #4
  Filled 2024-10-05: qty 30, 30d supply, fill #5
  Filled 2024-11-02: qty 30, 30d supply, fill #6

## 2024-05-13 ENCOUNTER — Other Ambulatory Visit (HOSPITAL_COMMUNITY): Payer: Self-pay

## 2024-05-20 ENCOUNTER — Encounter: Payer: Self-pay | Admitting: Hematology and Oncology

## 2024-05-20 ENCOUNTER — Inpatient Hospital Stay: Attending: Hematology and Oncology | Admitting: Hematology and Oncology

## 2024-05-20 ENCOUNTER — Inpatient Hospital Stay

## 2024-05-20 VITALS — BP 136/61 | HR 67 | Temp 97.7°F | Resp 18 | Ht 61.0 in | Wt 142.6 lb

## 2024-05-20 DIAGNOSIS — C9110 Chronic lymphocytic leukemia of B-cell type not having achieved remission: Secondary | ICD-10-CM | POA: Diagnosis not present

## 2024-05-20 LAB — COMPREHENSIVE METABOLIC PANEL WITH GFR
ALT: 16 U/L (ref 0–44)
AST: 15 U/L (ref 15–41)
Albumin: 4.2 g/dL (ref 3.5–5.0)
Alkaline Phosphatase: 61 U/L (ref 38–126)
Anion gap: 5 (ref 5–15)
BUN: 17 mg/dL (ref 8–23)
CO2: 32 mmol/L (ref 22–32)
Calcium: 9.7 mg/dL (ref 8.9–10.3)
Chloride: 105 mmol/L (ref 98–111)
Creatinine, Ser: 0.86 mg/dL (ref 0.44–1.00)
GFR, Estimated: >60 mL/min (ref >60)
Glucose, Bld: 101 mg/dL — ABNORMAL HIGH (ref 70–99)
Potassium: 4 mmol/L (ref 3.5–5.1)
Sodium: 142 mmol/L (ref 135–145)
Total Bilirubin: 0.7 mg/dL (ref 0.0–1.2)
Total Protein: 6.8 g/dL (ref 6.5–8.1)

## 2024-05-20 LAB — CBC WITH DIFFERENTIAL/PLATELET
Abs Immature Granulocytes: 0.05 K/uL (ref 0.00–0.07)
Basophils Absolute: 0.1 K/uL (ref 0.0–0.1)
Basophils Relative: 1 %
Eosinophils Absolute: 0.1 K/uL (ref 0.0–0.5)
Eosinophils Relative: 1 %
HCT: 40.6 % (ref 36.0–46.0)
Hemoglobin: 13.7 g/dL (ref 12.0–15.0)
Immature Granulocytes: 1 %
Lymphocytes Relative: 49 %
Lymphs Abs: 5.2 K/uL — ABNORMAL HIGH (ref 0.7–4.0)
MCH: 28.8 pg (ref 26.0–34.0)
MCHC: 33.7 g/dL (ref 30.0–36.0)
MCV: 85.5 fL (ref 80.0–100.0)
Monocytes Absolute: 0.6 K/uL (ref 0.1–1.0)
Monocytes Relative: 6 %
Neutro Abs: 4.4 K/uL (ref 1.7–7.7)
Neutrophils Relative %: 42 %
Platelets: 182 K/uL (ref 150–400)
RBC: 4.75 MIL/uL (ref 3.87–5.11)
RDW: 13.7 % (ref 11.5–15.5)
WBC: 10.5 K/uL (ref 4.0–10.5)
nRBC: 0 % (ref 0.0–0.2)

## 2024-05-20 NOTE — Assessment & Plan Note (Addendum)
 She was diagnosed with chronic lymphocytic leukemia since April 2014 FISH positive for deletion 13 q.  She was treated with Bendamustine  and rituximab  between October 2018 to January 2019 but relapsed in 2014 She has been on maintenance Calquence  since May 2024 CBC is reviewed Her leukocytosis is now resolved I plan to order CT imaging next week to review test results and to document complete response

## 2024-05-20 NOTE — Progress Notes (Signed)
 [facility] OFFICE PROGRESS NOTE  Patient Care Team: Aisha Harvey, MD as PCP - General (Family Medicine) O'Neal, Darryle Ned, MD as PCP - Cardiology (Cardiology) Watt Rush, MD as Attending Physician (Urology) Blanca Elsie RAMAN, MD as Attending Physician (Cardiology) Lonn Hicks, MD as Consulting Physician (Hematology and Oncology)  Assessment & Plan CLL (chronic lymphocytic leukemia) [facility]) She was diagnosed with chronic lymphocytic leukemia since April 2014 FISH positive for deletion 13 q.  She was treated with Bendamustine  and rituximab  between October 2018 to January 2019 but relapsed in 2014 She has been on maintenance Calquence  since May 2024 CBC is reviewed Her leukocytosis is now resolved I plan to order CT imaging next week to review test results and to document complete response  Orders Placed This Encounter  Procedures   CT CHEST ABDOMEN PELVIS W CONTRAST    Standing Status:   Future    Expected Date:   05/27/2024    Expiration Date:   05/20/2025    If indicated for the ordered procedure, I authorize the administration of contrast media per Radiology protocol:   Yes    Does the patient have a contrast media/X-ray dye allergy?:   No    Preferred imaging location?:   [facility]    If indicated for the ordered procedure, I authorize the administration of oral contrast media per Radiology protocol:   No    Reason for no oral contrast::   no need oral contrast     Hicks Lonn, MD  INTERVAL HISTORY: she returns for treatment follow-up Complications related to previous cycle of chemotherapy included none  PHYSICAL EXAMINATION: ECOG PERFORMANCE STATUS: 0 - Asymptomatic  No results found for: [id number]    Latest Ref Rng & Units 05/20/2024   10:19 AM 02/19/2024   12:24 PM 11/21/2023    9:26 AM  CBC  WBC 4.0 - 10.5 K/uL 10.5  14.1  22.8   Hemoglobin 12.0 - 15.0 g/dL 86.2  86.7  85.7   Hematocrit 36.0 - 46.0 % 40.6  39.1  43.2   Platelets 150 -  400 K/uL 182  186  226       Chemistry      Component Value Date/Time   NA 142 05/20/2024 1019   NA 141 11/13/2017 0753   K 4.0 05/20/2024 1019   K 4.1 11/13/2017 0753   CL 105 05/20/2024 1019   CL 104 02/08/2013 1454   CO2 32 05/20/2024 1019   CO2 27 11/13/2017 0753   BUN 17 05/20/2024 1019   BUN 11.6 11/13/2017 0753   CREATININE 0.86 05/20/2024 1019   CREATININE 0.84 04/10/2023 1128   CREATININE 0.8 11/13/2017 0753      Component Value Date/Time   CALCIUM 9.7 05/20/2024 1019   CALCIUM 9.3 11/13/2017 0753   ALKPHOS 61 05/20/2024 1019   ALKPHOS 83 11/13/2017 0753   AST 15 05/20/2024 1019   AST 16 04/10/2023 1128   AST 24 11/13/2017 0753   ALT 16 05/20/2024 1019   ALT 14 04/10/2023 1128   ALT 24 11/13/2017 0753   BILITOT 0.7 05/20/2024 1019   BILITOT 0.7 04/10/2023 1128   BILITOT 0.38 11/13/2017 0753       Vitals:   05/20/24 1052  BP: 136/61  Pulse: 67  Resp: 18  Temp: 97.7 F (36.5 C)  SpO2: 97%   Filed Weights   05/20/24 1052  Weight: 142 lb 9.6 oz (64.7 kg)   Other relevant data reviewed during this visit  included CBC and CMP

## 2024-05-26 ENCOUNTER — Other Ambulatory Visit: Payer: Self-pay

## 2024-05-26 NOTE — Progress Notes (Signed)
 Specialty Pharmacy Refill Coordination Note  [name] is a 78 y.o. female contacted today regarding refills of specialty medication(s) Acalabrutinib  Maleate (Calquence )   Patient requested Delivery   Delivery date: 05/28/24   Verified address: [address]  [place] [address]   Medication will be filled on 05/27/24.

## 2024-05-27 ENCOUNTER — Ambulatory Visit (HOSPITAL_COMMUNITY)
Admission: RE | Admit: 2024-05-27 | Discharge: 2024-05-27 | Disposition: A | Discharge status: Home / Self Care | Source: Ambulatory Visit | Attending: Hematology and Oncology | Admitting: Hematology and Oncology

## 2024-05-27 DIAGNOSIS — C9110 Chronic lymphocytic leukemia of B-cell type not having achieved remission: Secondary | ICD-10-CM | POA: Insufficient documentation

## 2024-05-27 MED ORDER — IOHEXOL 300 MG/ML  SOLN
100.0000 mL | Freq: Once | INTRAMUSCULAR | Status: AC | PRN
  Administered 2024-05-27: 100 mL via INTRAVENOUS

## 2024-05-27 MED ORDER — SODIUM CHLORIDE (PF) 0.9 % IJ SOLN
INTRAMUSCULAR | Status: AC
  Filled 2024-05-27: qty 50

## 2024-06-01 DIAGNOSIS — I10 Essential (primary) hypertension: Secondary | ICD-10-CM | POA: Diagnosis not present

## 2024-06-01 DIAGNOSIS — I480 Paroxysmal atrial fibrillation: Secondary | ICD-10-CM | POA: Diagnosis not present

## 2024-06-03 NOTE — Progress Notes (Signed)
 [facility] - Clinic Note  06/09/2024    CHIEF COMPLAINT Patient presents for Retina Follow Up  HISTORY OF PRESENT ILLNESS: Amanda [name] is a 78 y.o. female who presents to the clinic today for:  HPI     Retina Follow Up   Patient presents with  Wet AMD.  In both eyes.  This started 4 years ago.  Duration of 6 weeks.  Since onset it is stable.  I, the attending physician,  performed the HPI with the patient and updated documentation appropriately.        Comments   Pt wearing previous prescription glasses for transitions. Pt denies changes in vision/FOL/floatesr/pain. Pt is currently using [place] ou every day and At's about three times per day.       Last edited by Valdemar Rogue, MD on 06/09/2024  5:07 PM.      Patient states   Referring physician: Clem Brands, PA-C [facility] [address] [place],  [place] [address]  HISTORICAL INFORMATION:  Selected notes from the MEDICAL RECORD NUMBER Referred by Clem Brands, PA on 11.16.21 for eval of dry to wet AMD conversion OS.   CURRENT MEDICATIONS: No current outpatient medications on file. (Ophthalmic Drugs)   No current facility-administered medications for this visit. (Ophthalmic Drugs)   Current Outpatient Medications (Other)  Medication Sig   acalabrutinib  maleate (CALQUENCE ) 100 MG tablet Take 1 tablet (100 mg total) by mouth 2 (two) times daily.   acetaminophen  (TYLENOL ) 500 MG tablet Take 500 mg by mouth every 6 (six) hours as needed for pain.   acyclovir  (ZOVIRAX ) 400 MG tablet Take 1 tablet (400 mg total) by mouth daily.   atorvastatin (LIPITOR) 40 MG tablet Take 1 tablet by mouth daily.   Calcium Carb-Cholecalciferol (CALCIUM + D3) 600-200 MG-UNIT TABS Take 1 tablet by mouth 3 (three) times a week.    fluticasone (FLONASE) 50 MCG/ACT nasal spray Place 2 sprays into the nose daily.   folic acid (FOLVITE) 1 MG tablet Take 1 mg by mouth daily.    hydrochlorothiazide (HYDRODIURIL) 25 MG tablet Take 25 mg by mouth daily.   Multiple Vitamin (MULTIVITAMIN) tablet Take 1 tablet by mouth daily.   Multiple Vitamins-Minerals (EYE VITAMINS & MINERALS) TABS Take by mouth.   potassium chloride SA (K-DUR,KLOR-CON) 20 MEQ tablet Take 20 mEq by mouth 3 (three) times a week. Mon, Wed, Fri   No current facility-administered medications for this visit. (Other)   REVIEW OF SYSTEMS: ROS   Positive for: Cardiovascular, Eyes Negative for: Constitutional, Gastrointestinal, Neurological, Skin, Genitourinary, Musculoskeletal, HENT, Endocrine, Respiratory, Psychiatric, Allergic/Imm, Heme/Lymph Last edited by Elnor Avelina RAMAN, COT on 06/09/2024  1:39 PM.       ALLERGIES Allergies  Allergen Reactions   Codeine Nausea And Vomiting   Demerol [Meperidine] Nausea And Vomiting   Morphine And Codeine Nausea And Vomiting   Ciprofloxacin Rash   Sulfa Antibiotics Rash   PAST MEDICAL HISTORY Past Medical History:  Diagnosis Date   A-fib (HCC)    Adenopathy    Atrial fibrillation (HCC)    CLL (chronic lymphocytic leukemia) (HCC) 08/31/2013   HTN (hypertension)    Hyperlipidemia    Hypertensive retinopathy    Lymphocytosis    Macular degeneration    OU   Past Surgical History:  Procedure Laterality Date   ABDOMINAL HYSTERECTOMY     endometriosis, fibroid   APPENDECTOMY     BREAST BIOPSY     benign cyst.  CATARACT EXTRACTION Bilateral 2019   CHOLECYSTECTOMY     COLONOSCOPY  11/2012   Per Dr. Donnald.  neg.    EYE SURGERY     IR FLUORO GUIDE PORT INSERTION RIGHT  08/12/2017   IR REMOVAL TUN ACCESS W/ PORT W/O FL MOD SED  01/29/2018   IR US  GUIDE VASC ACCESS RIGHT  08/12/2017   SQUAMOUS CELL CARCINOMA EXCISION  12/28/2020   Excised from top of head   THUMB ARTHROSCOPY     TOTAL HIP ARTHROPLASTY  2010   FAMILY HISTORY Family History  Problem Relation Age of Onset   Renal Disease Mother    Cancer Mother        colon cancer   Cancer  Father        colon cancer   SOCIAL HISTORY Social History   Tobacco Use   Smoking status: Never   Smokeless tobacco: Never  Vaping Use   Vaping status: Never Used  Substance Use Topics   Alcohol use: No   Drug use: No       OPHTHALMIC EXAM: Base Eye Exam     Visual Acuity (Snellen - Linear)       Right Left   Dist cc 20/30 +2 20/30 +2   Dist ph cc 20/20 -2 20/20 -2    Correction: Glasses         Tonometry (Tonopen, 1:44 PM)       Right Left   Pressure 21 20         Pupils       Pupils Dark Light Shape React APD   Right PERRL 3 2 Round Brisk None   Left PERRL 3 2 Round Brisk None         Visual Fields       Left Right    Full Full         Extraocular Movement       Right Left    Full, Ortho Full, Ortho         Neuro/Psych     Oriented x3: Yes   Mood/Affect: Normal         Dilation     Both eyes: 1.0% Mydriacyl, 2.5% Phenylephrine @ 1:44 PM           Slit Lamp and Fundus Exam     Slit Lamp Exam       Right Left   Lids/Lashes Dermatochalasis - upper lid, mild Meibomian gland dysfunction Dermatochalasis - upper lid, mild Meibomian gland dysfunction   Conjunctiva/Sclera White and quiet White and quiet   Cornea Trace Debris in tear film, well healed temporal cataract wounds, 1+ pigmented Guttata, trace PEE well healed temporal cataract wounds, 2+ Punctate epithelial erosions   Anterior Chamber Deep and quiet Deep and quiet   Iris Round and dilated Round and dilated   Lens PC IOL in good position, trace PCO PC IOL in good position   Anterior Vitreous Trace Vitreous syneresis, Posterior vitreous detachment Trace Vitreous syneresis, Posterior vitreous detachment         Fundus Exam       Right Left   Disc Compact, trace pallor, Sharp rim, mild PPA Compact, Pink and Sharp, temporal PPP   C/D Ratio 0.1 0.2   Macula Blunted foveal reflex, Drusen, RPE mottling, clumping and early atrophy, shallow PED with trace SRF -- stably  improved Blunted foveal reflex, +CNV, Drusen, RPE mottling and clumping; trace shallow SRF, no heme   Vessels attenuated, Tortuous attenuated, Tortuous  Periphery Attached; mild reticular degeneration, no heme Attached, reticular degeneration, No heme           Refraction     Wearing Rx       Sphere Cylinder Axis Add   Right -0.25 +0.75 165 +2.50   Left -0.50 +0.50 012 +2.50    Age: 38-4 years   Type: PAL           IMAGING AND PROCEDURES  Imaging and Procedures for 06/09/2024  OCT, Retina - OU - Both Eyes       Right Eye Quality was good. Central Foveal Thickness: 257. Progression has been stable. Findings include normal foveal contour, no IRF, no SRF, retinal drusen , subretinal hyper-reflective material, intraretinal hyper-reflective material, pigment epithelial detachment, outer retinal atrophy (stable improvement in shallow SRF centrally overlying stable low PED, no IRF).   Left Eye Quality was good. Central Foveal Thickness: 312. Progression has been stable. Findings include no IRF, no SRF, abnormal foveal contour, retinal drusen , subretinal hyper-reflective material, choroidal neovascular membrane, pigment epithelial detachment, outer retinal atrophy (Trace shallow SRF overlying PED ST to fovea).   Notes *Images captured and stored on drive  Diagnosis / Impression:  OD: exu ARMD -- stable improvement in shallow SRF centrally overlying stable low PED, no IRF OS: exu ARMD -- Trace shallow SRF overlying PED ST to fovea  Clinical management:  See below  Abbreviations: NFP - Normal foveal profile. CME - cystoid macular edema. PED - pigment epithelial detachment. IRF - intraretinal fluid. SRF - subretinal fluid. EZ - ellipsoid zone. ERM - epiretinal membrane. ORA - outer retinal atrophy. ORT - outer retinal tubulation. SRHM - subretinal hyper-reflective material. IRHM - intraretinal hyper-reflective material      Intravitreal Injection, Pharmacologic Agent - OD -  Right Eye       Time Out 06/09/2024. 2:40 PM. Confirmed correct patient, procedure, site, and patient consented.   Anesthesia Topical anesthesia was used. Anesthetic medications included Lidocaine  2%, Proparacaine 0.5%.   Procedure Preparation included 5% betadine to ocular surface, eyelid speculum. A supplied (32g) needle was used.   Injection: 6 mg faricimab -svoa 6 MG/0.05ML   Route: Intravitreal, Site: Right Eye   NDC: 49757-903-93, Lot: [id number], Expiration date: 06/10/2025, Waste: 0 mL   Post-op Post injection exam found visual acuity of at least counting fingers. The patient tolerated the procedure well. There were no complications. The patient received written and verbal post procedure care education. Post injection medications were not given.      Intravitreal Injection, Pharmacologic Agent - OS - Left Eye       Time Out 06/09/2024. 2:40 PM. Confirmed correct patient, procedure, site, and patient consented.   Anesthesia Topical anesthesia was used. Anesthetic medications included Lidocaine  2%, Proparacaine 0.5%.   Procedure Preparation included 5% betadine to ocular surface, eyelid speculum. A supplied (32g) needle was used.   Injection: 6 mg faricimab -svoa 6 MG/0.05ML   Route: Intravitreal, Site: Left Eye   NDC: 49757-903-93, Lot: [id number], Expiration date: 06/10/2025, Waste: 0 mL   Post-op Post injection exam found visual acuity of at least counting fingers. The patient tolerated the procedure well. There were no complications. The patient received written and verbal post procedure care education.            ASSESSMENT/PLAN:    ICD-10-CM   1. Exudative age-related macular degeneration of left eye with active choroidal neovascularization (HCC)  H35.3221 OCT, Retina - OU - Both Eyes    Intravitreal Injection, Pharmacologic Agent -  OS - Left Eye    faricimab -svoa (VABYSMO ) 6mg /0.71mL intravitreal injection    2. Exudative age-related macular degeneration of  right eye with active choroidal neovascularization (HCC)  H35.3211 Intravitreal Injection, Pharmacologic Agent - OD - Right Eye    faricimab -svoa (VABYSMO ) 6mg /0.81mL intravitreal injection    3. Essential hypertension  I10     4. Hypertensive retinopathy of both eyes  H35.033     5. Pseudophakia, both eyes  Z96.1        1. Exudative age related macular degeneration, OS  - [facility] pt with known history of nonexudative ARMD OU - acute haze over vision OS -- onset, Friday 11.12.21 -- initial exam with central subretinal heme - s/p IVA OS #1 (11.17.21), #2 (12.15.21), #3 (01.12.22), #4 (02.09.22), #5 (03.16.22), #6 (04.20.22), #7 (06.01.22), #8 (06.29.22) -- IVA resistance ========================= - s/p IVE OS #1 (07.27.22), #2 (08.24.22), #3 (09.21.22), #4 (10.19.22), #5 (11.16.22), #6 (12.16.22), #7 (01.18.23), #8 (02.15.23) -- IVE resistance ========================= - s/p IVV OS #1 SAMPLE (03.15.23), #2 (04.12.23), #3 (05.10.23), #4 (06.07.23), #5 (07.19.23), #6 (08.22.23), #7 (09.27.23), #8 (12.20.23), #9 (01.31.24), #10 (03.13.24), #11 (04.24.24), #12 (05.31.24), #13 (07.09.24), #14 (08.21.24), #15 (10.02.24), #16 (11.13.24), #17 (12.18.24), #18 (01.22.25), #19 (02.26.25), #20 (04.02.25), #21 (05.07.25), #22 (06.18.25)  **h/o increased fluid at 6 wks on 11.13.24 and 07.30.25**  - BCVA OS 20/20 - stable - exam and OCT show OS: trace shallow SRF overlying PED ST to fovea at 6 wks  - recommend IVV OS #23 today, 07.30.25 with follow up in 6 weeks again - pt wishes to be treated with IVV OS - RBA of procedure discussed, questions answered - IVV informed consent obtained and signed 01.22.25 (OS) - see procedure note   - f/u 6 weeks -- DFE/OCT, possible injections  2. Exudative age related macular degeneration OD  - conversion to exudative ARMD noted on 03.15.23 exam  - exam with new SRH temporal macula -- stably improved today - s/p IVE OD #1 (03.15.23), #2 (04.12.23), #3  (05.10.23), #4 (06.07.23), #5 (07.19.23), #6 (08.19.23), #7 (09.27.23), #8 (11.01.23), #9 (03.11.24), #10 (07.09.24), #11 (08.21.24), #12 (10.02.24), #13 (11.13.24), #14 (12.18.24) -- IVE resistance ============================ - s/p IVV OD #1 (01.22.25), #2 (02.26.25), #3 (04.02.25), #4 (05.07.25), #5 (06.18.25) ============================  **h/o increased fluid at 6 wks on 11.13.24**  - BCVA OD improved to 20/20  - OCT OD: stable improvement in shallow SRF centrally overlying stable low PED, no IRF at 6 wks - recommend IVV OD #6 today, 07.30.25 w/ f/u in 6 wks  - pt in agreement  - RBA of procedure discussed, questions answered - see procedure note - IVV informed consent obtained and signed , 01.25.25 (OU)  - f/u 6 weeks -- DFE/OCT, possible injxns  3,4. Hypertensive retinopathy OU - discussed importance of tight BP control - monitor     5. Pseudophakia OU  - s/p CE/IOL OU  - IOLs in good position, doing well  - monitor   Ophthalmic Meds Ordered this visit:  Meds ordered this encounter  Medications   faricimab -svoa (VABYSMO ) 6mg /0.52mL intravitreal injection   faricimab -svoa (VABYSMO ) 6mg /0.31mL intravitreal injection     Return in about 6 weeks (around 07/21/2024) for f/u exu ARMD OU, DFE, OCT, Possible Injxn.  There are no Patient Instructions on file for this visit.  This document serves as a record of services personally performed by Redell JUDITHANN Hans, MD, PhD. It was created on their behalf by Almetta Pesa, an ophthalmic technician. The creation  of this record is the provider's dictation and/or activities during the visit.    Electronically signed by: Almetta Pesa, OA, 06/09/24  9:45 PM  This document serves as a record of services personally performed by Redell JUDITHANN Hans, MD, PhD. It was created on their behalf by Alan PARAS. Delores, OA an ophthalmic technician. The creation of this record is the provider's dictation and/or activities during the visit.     Electronically signed by: Alan PARAS. Delores, OA 06/09/24 9:45 PM  Redell JUDITHANN Hans, M.D., Ph.D. Diseases & Surgery of the Retina and Vitreous [facility]  I have reviewed the above documentation for accuracy and completeness, and I agree with the above. Redell JUDITHANN Hans, M.D., Ph.D. 06/09/24 9:46 PM   Abbreviations: M myopia (nearsighted); A astigmatism; H hyperopia (farsighted); P presbyopia; Mrx spectacle prescription;  CTL contact lenses; OD right eye; OS left eye; OU both eyes  XT exotropia; ET esotropia; PEK punctate epithelial keratitis; PEE punctate epithelial erosions; DES dry eye syndrome; MGD meibomian gland dysfunction; ATs artificial tears; PFAT's preservative free artificial tears; NSC nuclear sclerotic cataract; PSC posterior subcapsular cataract; ERM epi-retinal membrane; PVD posterior vitreous detachment; RD retinal detachment; DM diabetes mellitus; DR diabetic retinopathy; NPDR non-proliferative diabetic retinopathy; PDR proliferative diabetic retinopathy; CSME clinically significant macular edema; DME diabetic macular edema; dbh dot blot hemorrhages; CWS cotton wool spot; POAG primary open angle glaucoma; C/D cup-to-disc ratio; HVF humphrey visual field; GVF goldmann visual field; OCT optical coherence tomography; IOP intraocular pressure; BRVO Branch retinal vein occlusion; CRVO central retinal vein occlusion; CRAO central retinal artery occlusion; BRAO branch retinal artery occlusion; RT retinal tear; SB scleral buckle; PPV pars plana vitrectomy; VH Vitreous hemorrhage; PRP panretinal laser photocoagulation; IVK intravitreal kenalog ; VMT vitreomacular traction; MH Macular hole;  NVD neovascularization of the disc; NVE neovascularization elsewhere; AREDS age related eye disease study; ARMD age related macular degeneration; POAG primary open angle glaucoma; EBMD epithelial/anterior basement membrane dystrophy; ACIOL anterior chamber intraocular lens; IOL intraocular  lens; PCIOL posterior chamber intraocular lens; Phaco/IOL phacoemulsification with intraocular lens placement; PRK photorefractive keratectomy; LASIK laser assisted in situ keratomileusis; HTN hypertension; DM diabetes mellitus; COPD chronic obstructive pulmonary disease

## 2024-06-04 ENCOUNTER — Encounter: Payer: Self-pay | Admitting: Hematology and Oncology

## 2024-06-04 ENCOUNTER — Inpatient Hospital Stay: Admitting: Hematology and Oncology

## 2024-06-04 VITALS — BP 126/82 | HR 83 | Temp 98.6°F | Resp 18 | Ht 61.0 in | Wt 144.8 lb

## 2024-06-04 DIAGNOSIS — M1712 Unilateral primary osteoarthritis, left knee: Secondary | ICD-10-CM | POA: Diagnosis not present

## 2024-06-04 DIAGNOSIS — C9110 Chronic lymphocytic leukemia of B-cell type not having achieved remission: Secondary | ICD-10-CM

## 2024-06-04 DIAGNOSIS — M179 Osteoarthritis of knee, unspecified: Secondary | ICD-10-CM | POA: Insufficient documentation

## 2024-06-04 NOTE — Assessment & Plan Note (Addendum)
 I received a form from her orthopedic surgeon requesting surgical clearance for left total knee arthroplasty scheduled for August 30, 2024 Provide a clearance from the oncology perspective The patient is given verbal and written instruction to hold Calquence  a week before surgery Before she resumes Calquence , I will see her back the week after surgery to make sure her wound is healing well and her blood count is stable before resumption

## 2024-06-04 NOTE — Progress Notes (Signed)
 [facility] OFFICE PROGRESS NOTE  Patient Care Team: Aisha Harvey, MD as PCP - General (Family Medicine) O'Neal, Darryle Ned, MD as PCP - Cardiology (Cardiology) Watt Rush, MD as Attending Physician (Urology) Blanca Elsie RAMAN, MD as Attending Physician (Cardiology) Lonn Hicks, MD as Consulting Physician (Hematology and Oncology)  Assessment & Plan CLL (chronic lymphocytic leukemia) [facility]) She was diagnosed with chronic lymphocytic leukemia since April 2014 FISH positive for deletion 13 q.  She was treated with Bendamustine  and rituximab  between October 2018 to January 2019 but relapsed in 2014 She has been on maintenance Calquence  since May 2024 CT imaging from July 2025 showed complete response to therapy The plan will be to continue maintenance Calquence  for at least 2 years from now before discontinuation In anticipation for future surgery, I recommend the patient to hold Calquence  a week before her knee replacement surgery and I will see her the following week after her knee replacement to determine whether it is safe to resume treatment Primary osteoarthritis of left knee I received a form from her orthopedic surgeon requesting surgical clearance for left total knee arthroplasty scheduled for August 30, 2024 Provide a clearance from the oncology perspective The patient is given verbal and written instruction to hold Calquence  a week before surgery Before she resumes Calquence , I will see her back the week after surgery to make sure her wound is healing well and her blood count is stable before resumption  No orders of the defined types were placed in this encounter.    Hicks Lonn, MD  INTERVAL HISTORY: she returns for treatment follow-up on Calquence  for CLL Complications related to previous cycle of chemotherapy included none We spent majority of our time reviewing test results The patient also desired to have knee replacement surgery on October 20 I  received a form from her surgeon's office and completed the form for her to proceed with surgery with special instruction to hold her chemotherapy a week prior to surgery  PHYSICAL EXAMINATION: ECOG PERFORMANCE STATUS: 0 - Asymptomatic  No results found for: CAN125    Latest Ref Rng & Units 05/20/2024   10:19 AM 02/19/2024   12:24 PM 11/21/2023    9:26 AM  CBC  WBC 4.0 - 10.5 K/uL 10.5  14.1  22.8   Hemoglobin 12.0 - 15.0 g/dL 86.2  86.7  85.7   Hematocrit 36.0 - 46.0 % 40.6  39.1  43.2   Platelets 150 - 400 K/uL 182  186  226       Chemistry      Component Value Date/Time   NA 142 05/20/2024 1019   NA 141 11/13/2017 0753   K 4.0 05/20/2024 1019   K 4.1 11/13/2017 0753   CL 105 05/20/2024 1019   CL 104 02/08/2013 1454   CO2 32 05/20/2024 1019   CO2 27 11/13/2017 0753   BUN 17 05/20/2024 1019   BUN 11.6 11/13/2017 0753   CREATININE 0.86 05/20/2024 1019   CREATININE 0.84 04/10/2023 1128   CREATININE 0.8 11/13/2017 0753      Component Value Date/Time   CALCIUM 9.7 05/20/2024 1019   CALCIUM 9.3 11/13/2017 0753   ALKPHOS 61 05/20/2024 1019   ALKPHOS 83 11/13/2017 0753   AST 15 05/20/2024 1019   AST 16 04/10/2023 1128   AST 24 11/13/2017 0753   ALT 16 05/20/2024 1019   ALT 14 04/10/2023 1128   ALT 24 11/13/2017 0753   BILITOT 0.7 05/20/2024 1019   BILITOT 0.7 04/10/2023  1128   BILITOT 0.38 11/13/2017 0753       Vitals:   06/04/24 1015  BP: 126/82  Pulse: 83  Resp: 18  Temp: 98.6 F (37 C)  SpO2: 99%   Filed Weights   06/04/24 1015  Weight: 144 lb 12.8 oz (65.7 kg)   Other relevant data reviewed during this visit included CT imaging from July 2025

## 2024-06-04 NOTE — Assessment & Plan Note (Addendum)
 She was diagnosed with chronic lymphocytic leukemia since April 2014 FISH positive for deletion 13 q.  She was treated with Bendamustine  and rituximab  between October 2018 to January 2019 but relapsed in 2014 She has been on maintenance Calquence  since May 2024 CT imaging from July 2025 showed complete response to therapy The plan will be to continue maintenance Calquence  for at least 2 years from now before discontinuation In anticipation for future surgery, I recommend the patient to hold Calquence  a week before her knee replacement surgery and I will see her the following week after her knee replacement to determine whether it is safe to resume treatment

## 2024-06-08 ENCOUNTER — Other Ambulatory Visit: Payer: Self-pay

## 2024-06-08 ENCOUNTER — Other Ambulatory Visit (HOSPITAL_COMMUNITY): Payer: Self-pay

## 2024-06-09 ENCOUNTER — Ambulatory Visit (INDEPENDENT_AMBULATORY_CARE_PROVIDER_SITE_OTHER): Admitting: Ophthalmology

## 2024-06-09 ENCOUNTER — Encounter (INDEPENDENT_AMBULATORY_CARE_PROVIDER_SITE_OTHER): Payer: Self-pay | Admitting: Ophthalmology

## 2024-06-09 DIAGNOSIS — I10 Essential (primary) hypertension: Secondary | ICD-10-CM

## 2024-06-09 DIAGNOSIS — Z961 Presence of intraocular lens: Secondary | ICD-10-CM | POA: Diagnosis not present

## 2024-06-09 DIAGNOSIS — H353221 Exudative age-related macular degeneration, left eye, with active choroidal neovascularization: Secondary | ICD-10-CM

## 2024-06-09 DIAGNOSIS — H35033 Hypertensive retinopathy, bilateral: Secondary | ICD-10-CM | POA: Diagnosis not present

## 2024-06-09 DIAGNOSIS — H353231 Exudative age-related macular degeneration, bilateral, with active choroidal neovascularization: Secondary | ICD-10-CM

## 2024-06-09 DIAGNOSIS — H353211 Exudative age-related macular degeneration, right eye, with active choroidal neovascularization: Secondary | ICD-10-CM

## 2024-06-09 MED ORDER — FARICIMAB-SVOA 6 MG/0.05ML IZ SOSY
6.0000 mg | PREFILLED_SYRINGE | INTRAVITREAL | Status: AC | PRN
  Administered 2024-06-09: 6 mg via INTRAVITREAL

## 2024-06-10 DIAGNOSIS — I480 Paroxysmal atrial fibrillation: Secondary | ICD-10-CM | POA: Diagnosis not present

## 2024-06-10 DIAGNOSIS — E782 Mixed hyperlipidemia: Secondary | ICD-10-CM | POA: Diagnosis not present

## 2024-06-10 DIAGNOSIS — I10 Essential (primary) hypertension: Secondary | ICD-10-CM | POA: Diagnosis not present

## 2024-06-16 DIAGNOSIS — M1712 Unilateral primary osteoarthritis, left knee: Secondary | ICD-10-CM | POA: Diagnosis not present

## 2024-06-18 ENCOUNTER — Other Ambulatory Visit: Payer: Self-pay

## 2024-06-18 NOTE — Progress Notes (Signed)
 Specialty Pharmacy Refill Coordination Note  Amanda [name] is a 78 y.o. female contacted today regarding refills of specialty medication(s) Acalabrutinib  Maleate (Calquence )   Patient requested Delivery   Delivery date: 06/23/24   Verified address: [address]  [place] [id number]   Medication will be filled on 06/22/24.

## 2024-07-01 DIAGNOSIS — I480 Paroxysmal atrial fibrillation: Secondary | ICD-10-CM | POA: Diagnosis not present

## 2024-07-01 DIAGNOSIS — I10 Essential (primary) hypertension: Secondary | ICD-10-CM | POA: Diagnosis not present

## 2024-07-08 ENCOUNTER — Other Ambulatory Visit (HOSPITAL_COMMUNITY): Payer: Self-pay

## 2024-07-11 DIAGNOSIS — I480 Paroxysmal atrial fibrillation: Secondary | ICD-10-CM | POA: Diagnosis not present

## 2024-07-11 DIAGNOSIS — I10 Essential (primary) hypertension: Secondary | ICD-10-CM | POA: Diagnosis not present

## 2024-07-13 DIAGNOSIS — E782 Mixed hyperlipidemia: Secondary | ICD-10-CM | POA: Diagnosis not present

## 2024-07-13 DIAGNOSIS — C9111 Chronic lymphocytic leukemia of B-cell type in remission: Secondary | ICD-10-CM | POA: Diagnosis not present

## 2024-07-13 DIAGNOSIS — I480 Paroxysmal atrial fibrillation: Secondary | ICD-10-CM | POA: Diagnosis not present

## 2024-07-13 DIAGNOSIS — M1712 Unilateral primary osteoarthritis, left knee: Secondary | ICD-10-CM | POA: Diagnosis not present

## 2024-07-13 DIAGNOSIS — I10 Essential (primary) hypertension: Secondary | ICD-10-CM | POA: Diagnosis not present

## 2024-07-13 DIAGNOSIS — Z6825 Body mass index (BMI) 25.0-25.9, adult: Secondary | ICD-10-CM | POA: Diagnosis not present

## 2024-07-14 NOTE — Progress Notes (Signed)
 [facility] - Clinic Note  07/21/2024    CHIEF COMPLAINT Patient presents for Retina Follow Up  HISTORY OF PRESENT ILLNESS: Amanda [name] is a 78 y.o. female who presents to the clinic today for:  HPI     Retina Follow Up   Patient presents with  Wet AMD.  In both eyes.  This started 6 weeks ago.  I, the attending physician,  performed the HPI with the patient and updated documentation appropriately.        Comments   Patient here for 6 weeks retina follow up for exu ARMD OU. Patient states vision the same. Good. No eye pain.       Last edited by Valdemar Rogue, MD on 07/21/2024 11:15 PM.     Patient states she's been doing well, having knee replacement surgery Oct 20th.   Referring physician: Clem Brands, PA-C [facility] [address] [place],  [place] [address]  HISTORICAL INFORMATION:  Selected notes from the MEDICAL RECORD NUMBER Referred by Clem Brands, PA on 11.16.21 for eval of dry to wet AMD conversion OS.   CURRENT MEDICATIONS: No current outpatient medications on file. (Ophthalmic Drugs)   No current facility-administered medications for this visit. (Ophthalmic Drugs)   Current Outpatient Medications (Other)  Medication Sig   acalabrutinib  maleate (CALQUENCE ) 100 MG tablet Take 1 tablet (100 mg total) by mouth 2 (two) times daily.   acetaminophen  (TYLENOL ) 500 MG tablet Take 500 mg by mouth every 6 (six) hours as needed for pain.   acyclovir  (ZOVIRAX ) 400 MG tablet Take 1 tablet (400 mg total) by mouth daily.   atorvastatin (LIPITOR) 40 MG tablet Take 1 tablet by mouth daily.   Calcium Carb-Cholecalciferol (CALCIUM + D3) 600-200 MG-UNIT TABS Take 1 tablet by mouth 3 (three) times a week.    fluticasone (FLONASE) 50 MCG/ACT nasal spray Place 2 sprays into the nose daily.   folic acid (FOLVITE) 1 MG tablet Take 1 mg by mouth daily.   hydrochlorothiazide (HYDRODIURIL) 25 MG tablet Take 25 mg by mouth daily.    Multiple Vitamin (MULTIVITAMIN) tablet Take 1 tablet by mouth daily.   Multiple Vitamins-Minerals (EYE VITAMINS & MINERALS) TABS Take by mouth.   potassium chloride SA (K-DUR,KLOR-CON) 20 MEQ tablet Take 20 mEq by mouth 3 (three) times a week. Mon, Wed, Fri   No current facility-administered medications for this visit. (Other)   REVIEW OF SYSTEMS: ROS   Positive for: Cardiovascular, Eyes Negative for: Constitutional, Gastrointestinal, Neurological, Skin, Genitourinary, Musculoskeletal, HENT, Endocrine, Respiratory, Psychiatric, Allergic/Imm, Heme/Lymph Last edited by Orval Asberry RAMAN, COA on 07/21/2024  2:08 PM.        ALLERGIES Allergies  Allergen Reactions   Codeine Nausea And Vomiting   Demerol [Meperidine] Nausea And Vomiting   Morphine And Codeine Nausea And Vomiting   Ciprofloxacin Rash   Sulfa Antibiotics Rash   PAST MEDICAL HISTORY Past Medical History:  Diagnosis Date   A-fib (HCC)    Adenopathy    Atrial fibrillation (HCC)    CLL (chronic lymphocytic leukemia) (HCC) 08/31/2013   HTN (hypertension)    Hyperlipidemia    Hypertensive retinopathy    Lymphocytosis    Macular degeneration    OU   Past Surgical History:  Procedure Laterality Date   ABDOMINAL HYSTERECTOMY     endometriosis, fibroid   APPENDECTOMY     BREAST BIOPSY     benign cyst.   CATARACT EXTRACTION Bilateral 2019   CHOLECYSTECTOMY  COLONOSCOPY  11/2012   Per Dr. Donnald.  neg.    EYE SURGERY     IR FLUORO GUIDE PORT INSERTION RIGHT  08/12/2017   IR REMOVAL TUN ACCESS W/ PORT W/O FL MOD SED  01/29/2018   IR US  GUIDE VASC ACCESS RIGHT  08/12/2017   SQUAMOUS CELL CARCINOMA EXCISION  12/28/2020   Excised from top of head   THUMB ARTHROSCOPY     TOTAL HIP ARTHROPLASTY  2010   FAMILY HISTORY Family History  Problem Relation Age of Onset   Renal Disease Mother    Cancer Mother        colon cancer   Cancer Father        colon cancer   SOCIAL HISTORY Social History   Tobacco  Use   Smoking status: Never   Smokeless tobacco: Never  Vaping Use   Vaping status: Never Used  Substance Use Topics   Alcohol use: No   Drug use: No       OPHTHALMIC EXAM: Base Eye Exam     Visual Acuity (Snellen - Linear)       Right Left   Dist cc 20/25 -2 20/20 -1   Dist ph cc 20/20 -2     Correction: Glasses         Tonometry (Tonopen, 2:07 PM)       Right Left   Pressure 16 16         Pupils       Dark Light Shape React APD   Right 3 2 Round Brisk None   Left 3 2 Round Brisk None         Visual Fields (Counting fingers)       Left Right    Full Full         Extraocular Movement       Right Left    Full, Ortho Full, Ortho         Neuro/Psych     Oriented x3: Yes   Mood/Affect: Normal         Dilation     Both eyes: 1.0% Mydriacyl, 2.5% Phenylephrine @ 2:07 PM           Slit Lamp and Fundus Exam     Slit Lamp Exam       Right Left   Lids/Lashes Dermatochalasis - upper lid, mild Meibomian gland dysfunction Dermatochalasis - upper lid, mild Meibomian gland dysfunction   Conjunctiva/Sclera White and quiet White and quiet   Cornea Trace Debris in tear film, well healed temporal cataract wounds, 1+ pigmented Guttata, trace PEE well healed temporal cataract wounds, 2+ Punctate epithelial erosions   Anterior Chamber Deep and quiet Deep and quiet   Iris Round and dilated Round and dilated   Lens PC IOL in good position, trace PCO PC IOL in good position   Anterior Vitreous Trace Vitreous syneresis, Posterior vitreous detachment Trace Vitreous syneresis, Posterior vitreous detachment         Fundus Exam       Right Left   Disc Compact, trace pallor, Sharp rim, mild PPA Compact, Pink and Sharp, temporal PPP   C/D Ratio 0.1 0.2   Macula Blunted foveal reflex, Drusen, RPE mottling, clumping and early atrophy, shallow PED with trace SRF -- stably improved Blunted foveal reflex, +CNV, Drusen, RPE mottling and clumping; trace  shallow SRF--improved, no heme   Vessels attenuated, Tortuous attenuated, Tortuous   Periphery Attached; mild reticular degeneration, no heme Attached, reticular degeneration, No  heme           Refraction     Wearing Rx       Sphere Cylinder Axis Add   Right -0.25 +0.75 165 +2.50   Left -0.50 +0.50 012 +2.50    Type: PAL           IMAGING AND PROCEDURES  Imaging and Procedures for 07/21/2024  OCT, Retina - OU - Both Eyes       Right Eye Quality was good. Central Foveal Thickness: 256. Progression has been stable. Findings include normal foveal contour, no IRF, no SRF, retinal drusen , subretinal hyper-reflective material, intraretinal hyper-reflective material, pigment epithelial detachment, outer retinal atrophy (stable improvement in shallow SRF centrally overlying stable low PED, no IRF).   Left Eye Quality was good. Central Foveal Thickness: 311. Progression has been stable. Findings include no IRF, no SRF, abnormal foveal contour, retinal drusen , subretinal hyper-reflective material, choroidal neovascular membrane, pigment epithelial detachment, outer retinal atrophy (Stable improvement in shallow SRF overlying PED ).   Notes *Images captured and stored on drive  Diagnosis / Impression:  OD: exu ARMD -- stable improvement in shallow SRF centrally overlying stable low PED, no IRF OS: exu ARMD -- Stable improvement in shallow SRF overlying PED   Clinical management:  See below  Abbreviations: NFP - Normal foveal profile. CME - cystoid macular edema. PED - pigment epithelial detachment. IRF - intraretinal fluid. SRF - subretinal fluid. EZ - ellipsoid zone. ERM - epiretinal membrane. ORA - outer retinal atrophy. ORT - outer retinal tubulation. SRHM - subretinal hyper-reflective material. IRHM - intraretinal hyper-reflective material      Intravitreal Injection, Pharmacologic Agent - OD - Right Eye       Time Out 07/21/2024. 3:03 PM. Confirmed correct patient,  procedure, site, and patient consented.   Anesthesia Topical anesthesia was used. Anesthetic medications included Lidocaine  2%, Proparacaine 0.5%.   Procedure Preparation included 5% betadine to ocular surface, eyelid speculum. A supplied (32g) needle was used.   Injection: 6 mg faricimab -svoa 6 MG/0.05ML   Route: Intravitreal, Site: Right Eye   NDC: 49757-903-93, Lot: [id number], Expiration date: 03/10/2025, Waste: 0 mL   Post-op Post injection exam found visual acuity of at least counting fingers. The patient tolerated the procedure well. There were no complications. The patient received written and verbal post procedure care education. Post injection medications were not given.      Intravitreal Injection, Pharmacologic Agent - OS - Left Eye       Time Out 07/21/2024. 3:03 PM. Confirmed correct patient, procedure, site, and patient consented.   Anesthesia Topical anesthesia was used. Anesthetic medications included Lidocaine  2%, Proparacaine 0.5%.   Procedure Preparation included 5% betadine to ocular surface, eyelid speculum. A supplied (32g) needle was used.   Injection: 6 mg faricimab -svoa 6 MG/0.05ML   Route: Intravitreal, Site: Left Eye   NDC: 49757-903-93, Lot: [id number], Expiration date: 06/09/2025, Waste: 0 mL   Post-op Post injection exam found visual acuity of at least counting fingers. The patient tolerated the procedure well. There were no complications. The patient received written and verbal post procedure care education.            ASSESSMENT/PLAN:    ICD-10-CM   1. Exudative age-related macular degeneration of left eye with active choroidal neovascularization (HCC)  H35.3221 OCT, Retina - OU - Both Eyes    Intravitreal Injection, Pharmacologic Agent - OD - Right Eye    Intravitreal Injection, Pharmacologic Agent - OS - Left Eye  faricimab -svoa (VABYSMO ) 6mg /0.16mL intravitreal injection    faricimab -svoa (VABYSMO ) 6mg /0.2mL intravitreal injection     2. Exudative age-related macular degeneration of right eye with active choroidal neovascularization (HCC)  H35.3211     3. Essential hypertension  I10     4. Hypertensive retinopathy of both eyes  H35.033     5. Pseudophakia, both eyes  Z96.1      1. Exudative age related macular degeneration, OS  - [facility] pt with known history of nonexudative ARMD OU - acute haze over vision OS -- onset, Friday 11.12.21 -- initial exam with central subretinal heme - s/p IVA OS #1 (11.17.21), #2 (12.15.21), #3 (01.12.22), #4 (02.09.22), #5 (03.16.22), #6 (04.20.22), #7 (06.01.22), #8 (06.29.22) -- IVA resistance ========================= - s/p IVE OS #1 (07.27.22), #2 (08.24.22), #3 (09.21.22), #4 (10.19.22), #5 (11.16.22), #6 (12.16.22), #7 (01.18.23), #8 (02.15.23) -- IVE resistance ========================= - s/p IVV OS #1 SAMPLE (03.15.23), #2 (04.12.23), #3 (05.10.23), #4 (06.07.23), #5 (07.19.23), #6 (08.22.23), #7 (09.27.23), #8 (12.20.23), #9 (01.31.24), #10 (03.13.24), #11 (04.24.24), #12 (05.31.24), #13 (07.09.24), #14 (08.21.24), #15 (10.02.24), #16 (11.13.24), #17 (12.18.24), #18 (01.22.25), #19 (02.26.25), #20 (04.02.25), #21 (05.07.25), #22 (06.18.25), #23 (07.30.25)  **h/o increased fluid at 6 wks on 11.13.24 and 07.30.25**  - BCVA OS 20/20 - stable - exam and OCT show OS: Stable improvement in shallow SRF overlying PED at 6 wks  - recommend IVV OS #24 today, 09.10.25 with follow up ext to 7 weeks - pt wishes to be treated with IVV OS - RBA of procedure discussed, questions answered - IVV informed consent obtained and signed 01.22.25 (OS) - see procedure note   - f/u 7 weeks -- DFE/OCT, possible injections  2. Exudative age related macular degeneration OD  - conversion to exudative ARMD noted on 03.15.23 exam  - exam with new SRH temporal macula -- stably improved today - s/p IVE OD #1 (03.15.23), #2 (04.12.23), #3 (05.10.23), #4 (06.07.23), #5 (07.19.23), #6 (08.19.23), #7  (09.27.23), #8 (11.01.23), #9 (03.11.24), #10 (07.09.24), #11 (08.21.24), #12 (10.02.24), #13 (11.13.24), #14 (12.18.24) -- IVE resistance ============================ - s/p IVV OD #1 (01.22.25), #2 (02.26.25), #3 (04.02.25), #4 (05.07.25), #5 (06.18.25), #6 (07.30.25) ============================  **h/o increased fluid at 6 wks on 11.13.24 IVE OD**  - BCVA OD improved to 20/20  - OCT OD: stable improvement in shallow SRF centrally overlying stable low PED, no IRF at 6 wks - recommend IVV OD #7 today, 09.10.25 w/ f/u in ext to 7 wks  - pt in agreement  - RBA of procedure discussed, questions answered - see procedure note - IVV informed consent obtained and signed , 01.25.25 (OU)  - f/u 7 weeks -- DFE/OCT, possible injxns  3,4. Hypertensive retinopathy OU - discussed importance of tight BP control - monitor     5. Pseudophakia OU  - s/p CE/IOL OU  - IOLs in good position, doing well  - monitor   Ophthalmic Meds Ordered this visit:  Meds ordered this encounter  Medications   faricimab -svoa (VABYSMO ) 6mg /0.3mL intravitreal injection   faricimab -svoa (VABYSMO ) 6mg /0.53mL intravitreal injection     Return in about 7 weeks (around 09/08/2024) for exu ARMD OU, DFE, OCT, Possible Injxn.  There are no Patient Instructions on file for this visit.  This document serves as a record of services personally performed by Redell JUDITHANN Hans, MD, PhD. It was created on their behalf by Almetta Pesa, an ophthalmic technician. The creation of this record is the provider's dictation and/or activities during the  visit.    Electronically signed by: Almetta Pesa, OA, 07/26/24  8:19 AM  Redell JUDITHANN Hans, M.D., Ph.D. Diseases & Surgery of the Retina and Vitreous [facility]  I have reviewed the above documentation for accuracy and completeness, and I agree with the above. Redell JUDITHANN Hans, M.D., Ph.D. 07/26/24 8:20 AM   Abbreviations: M myopia (nearsighted); A  astigmatism; H hyperopia (farsighted); P presbyopia; Mrx spectacle prescription;  CTL contact lenses; OD right eye; OS left eye; OU both eyes  XT exotropia; ET esotropia; PEK punctate epithelial keratitis; PEE punctate epithelial erosions; DES dry eye syndrome; MGD meibomian gland dysfunction; ATs artificial tears; PFAT's preservative free artificial tears; NSC nuclear sclerotic cataract; PSC posterior subcapsular cataract; ERM epi-retinal membrane; PVD posterior vitreous detachment; RD retinal detachment; DM diabetes mellitus; DR diabetic retinopathy; NPDR non-proliferative diabetic retinopathy; PDR proliferative diabetic retinopathy; CSME clinically significant macular edema; DME diabetic macular edema; dbh dot blot hemorrhages; CWS cotton wool spot; POAG primary open angle glaucoma; C/D cup-to-disc ratio; HVF humphrey visual field; GVF goldmann visual field; OCT optical coherence tomography; IOP intraocular pressure; BRVO Branch retinal vein occlusion; CRVO central retinal vein occlusion; CRAO central retinal artery occlusion; BRAO branch retinal artery occlusion; RT retinal tear; SB scleral buckle; PPV pars plana vitrectomy; VH Vitreous hemorrhage; PRP panretinal laser photocoagulation; IVK intravitreal kenalog ; VMT vitreomacular traction; MH Macular hole;  NVD neovascularization of the disc; NVE neovascularization elsewhere; AREDS age related eye disease study; ARMD age related macular degeneration; POAG primary open angle glaucoma; EBMD epithelial/anterior basement membrane dystrophy; ACIOL anterior chamber intraocular lens; IOL intraocular lens; PCIOL posterior chamber intraocular lens; Phaco/IOL phacoemulsification with intraocular lens placement; PRK photorefractive keratectomy; LASIK laser assisted in situ keratomileusis; HTN hypertension; DM diabetes mellitus; COPD chronic obstructive pulmonary disease

## 2024-07-15 ENCOUNTER — Other Ambulatory Visit (HOSPITAL_COMMUNITY): Payer: Self-pay

## 2024-07-19 ENCOUNTER — Other Ambulatory Visit: Payer: Self-pay

## 2024-07-19 NOTE — Progress Notes (Signed)
 Specialty Pharmacy Refill Coordination Note  Amanda [name] is a 78 y.o. female contacted today regarding refills of specialty medication(s) Acalabrutinib  Maleate (Calquence )   Patient requested Delivery   Delivery date: 08/03/24   Verified address: [address]  [place] [address]   Medication will be filled on 09.22.25.   Patient began current bottle of medication on 8.27.25.

## 2024-07-21 ENCOUNTER — Ambulatory Visit (INDEPENDENT_AMBULATORY_CARE_PROVIDER_SITE_OTHER): Admitting: Ophthalmology

## 2024-07-21 ENCOUNTER — Encounter (INDEPENDENT_AMBULATORY_CARE_PROVIDER_SITE_OTHER): Payer: Self-pay | Admitting: Ophthalmology

## 2024-07-21 DIAGNOSIS — H353231 Exudative age-related macular degeneration, bilateral, with active choroidal neovascularization: Secondary | ICD-10-CM

## 2024-07-21 DIAGNOSIS — I10 Essential (primary) hypertension: Secondary | ICD-10-CM

## 2024-07-21 DIAGNOSIS — H35033 Hypertensive retinopathy, bilateral: Secondary | ICD-10-CM

## 2024-07-21 DIAGNOSIS — H353211 Exudative age-related macular degeneration, right eye, with active choroidal neovascularization: Secondary | ICD-10-CM

## 2024-07-21 DIAGNOSIS — Z961 Presence of intraocular lens: Secondary | ICD-10-CM | POA: Diagnosis not present

## 2024-07-21 DIAGNOSIS — H353221 Exudative age-related macular degeneration, left eye, with active choroidal neovascularization: Secondary | ICD-10-CM

## 2024-07-21 MED ORDER — FARICIMAB-SVOA 6 MG/0.05ML IZ SOSY
6.0000 mg | PREFILLED_SYRINGE | INTRAVITREAL | Status: AC | PRN
  Administered 2024-07-21: 6 mg via INTRAVITREAL

## 2024-07-31 DIAGNOSIS — I10 Essential (primary) hypertension: Secondary | ICD-10-CM | POA: Diagnosis not present

## 2024-07-31 DIAGNOSIS — I480 Paroxysmal atrial fibrillation: Secondary | ICD-10-CM | POA: Diagnosis not present

## 2024-08-02 ENCOUNTER — Other Ambulatory Visit: Payer: Self-pay

## 2024-08-06 ENCOUNTER — Other Ambulatory Visit (HOSPITAL_COMMUNITY): Payer: Self-pay

## 2024-08-10 DIAGNOSIS — I10 Essential (primary) hypertension: Secondary | ICD-10-CM | POA: Diagnosis not present

## 2024-08-10 DIAGNOSIS — E782 Mixed hyperlipidemia: Secondary | ICD-10-CM | POA: Diagnosis not present

## 2024-08-10 DIAGNOSIS — M25562 Pain in left knee: Secondary | ICD-10-CM | POA: Diagnosis not present

## 2024-08-10 DIAGNOSIS — I480 Paroxysmal atrial fibrillation: Secondary | ICD-10-CM | POA: Diagnosis not present

## 2024-08-12 DIAGNOSIS — Z23 Encounter for immunization: Secondary | ICD-10-CM | POA: Diagnosis not present

## 2024-08-16 NOTE — Progress Notes (Signed)
 Anesthesia Review:  PCP:  Sari Pay LOV 06/10/24  Clearance 07/13/24 in Media tab  Cardiologist : Darryle O'Neal  LOV 6.?08/2023  [place] [place]   PPM/ ICD: Device Orders: Rep Notified:  Chest x-ray : Ct Chest- 06/02/24  EKG : 08/18/2024  Echo : Stress test: Cardiac Cath :   Activity level: can do a flight of stairs without difficutly  Sleep Study/ CPAP : none  Fasting Blood Sugar :      / Checks Blood Sugar -- times a day:    Blood Thinner/ Instructions /Last Dose: ASA / Instructions/ Last Dose :    CLL   Retired Charity fundraiser from [employer]    Hx of htn meds has not been on in 2 months per pt due to blood pressure too low and she was taken off of.   Lives at [facility] in [place].

## 2024-08-17 NOTE — Patient Instructions (Signed)
 SURGICAL WAITING ROOM VISITATION  Patients having surgery or a procedure may have no more than 2 support people in the waiting area - these visitors may rotate.    Children under the age of 48 must have an adult with them who is not the patient.  Visitors with respiratory illnesses are discouraged from visiting and should remain at home.  If the patient needs to stay at the hospital during part of their recovery, the visitor guidelines for inpatient rooms apply. Pre-op nurse will coordinate an appropriate time for 1 support person to accompany patient in pre-op.  This support person may not rotate.    Please refer to the [facility] website for the visitor guidelines for Inpatients (after your surgery is over and you are in a regular room).       Your procedure is scheduled on:  08/30/2024    Report to [facility] Main Entrance    Report to admitting at  0645 AM   Call this number if you have problems the morning of surgery [phone]   Do not eat food :After Midnight.   After Midnight you may have the following liquids until _ 0615_____ AM  DAY OF SURGERY  Water Non-Citrus Juices (without pulp, NO RED-Apple, White grape, White cranberry) Black Coffee (NO MILK/CREAM OR CREAMERS, sugar ok)  Clear Tea (NO MILK/CREAM OR CREAMERS, sugar ok) regular and decaf                             Plain Jell-O (NO RED)                                           Fruit ices (not with fruit pulp, NO RED)                                     Popsicles (NO RED)                                                               Sports drinks like Gatorade (NO RED)                     The day of surgery:  Drink ONE (1) Pre-Surgery Clear Ensure or G2 at 0615 AM the morning of surgery. Drink in one sitting. Do not sip.  This drink was given to you during your hospital  pre-op appointment visit. Nothing else to drink after completing the  Pre-Surgery Clear Ensure or G2.          If you have  questions, please contact your surgeon's office.      Oral Hygiene is also important to reduce your risk of infection.                                    Remember - BRUSH YOUR TEETH THE MORNING OF SURGERY WITH YOUR REGULAR TOOTHPASTE  DENTURES WILL BE REMOVED PRIOR TO SURGERY PLEASE DO NOT APPLY Poly grip OR  ADHESIVES!!!   Do NOT smoke after Midnight   Stop all vitamins and herbal supplements 7 days before surgery.   Take these medicines the morning of surgery with A SIP OF WATER:  flonase  DO NOT TAKE ANY ORAL DIABETIC MEDICATIONS DAY OF YOUR SURGERY  Bring CPAP mask and tubing day of surgery.                              You may not have any metal on your body including hair pins, jewelry, and body piercing             Do not wear make-up, lotions, powders, perfumes/cologne, or deodorant  Do not wear nail polish including gel and S&S, artificial/acrylic nails, or any other type of covering on natural nails including finger and toenails. If you have artificial nails, gel coating, etc. that needs to be removed by a nail salon please have this removed prior to surgery or surgery may need to be canceled/ delayed if the surgeon/ anesthesia feels like they are unable to be safely monitored.   Do not shave  48 hours prior to surgery.               Men may shave face and neck.   Do not bring valuables to the hospital. [facility] IS NOT             RESPONSIBLE   FOR VALUABLES.   Contacts, glasses, dentures or bridgework may not be worn into surgery.   Bring small overnight bag day of surgery.   DO NOT BRING YOUR HOME MEDICATIONS TO THE HOSPITAL. PHARMACY WILL DISPENSE MEDICATIONS LISTED ON YOUR MEDICATION LIST TO YOU DURING YOUR ADMISSION IN THE HOSPITAL!    Patients discharged on the day of surgery will not be allowed to drive home.  Someone NEEDS to stay with you for the first 24 hours after anesthesia.   Special Instructions: Bring a copy of your healthcare power of attorney  and living will documents the day of surgery if you haven't scanned them before.              Please read over the following fact sheets you were given: IF YOU HAVE QUESTIONS ABOUT YOUR PRE-OP INSTRUCTIONS PLEASE CALL [phone].   If you received a COVID test during your pre-op visit  it is requested that you wear a mask when out in public, stay away from anyone that may not be feeling well and notify your surgeon if you develop symptoms. If you test positive for Covid or have been in contact with anyone that has tested positive in the last 10 days please notify you surgeon.      Pre-operative 4 CHG Bath Instructions   You can play a key role in reducing the risk of infection after surgery. Your skin needs to be as free of germs as possible. You can reduce the number of germs on your skin by washing with CHG (chlorhexidine gluconate) soap before surgery. CHG is an antiseptic soap that kills germs and continues to kill germs even after washing.   DO NOT use if you have an allergy to chlorhexidine/CHG or antibacterial soaps. If your skin becomes reddened or irritated, stop using the CHG and notify one of our RNs at [phone].   Please shower with the CHG soap starting 4 days before surgery using the following schedule:     Please keep in mind the following:  DO NOT shave, including legs and underarms, starting the day of your first shower.   You may shave your face at any point before/day of surgery.  Place clean sheets on your bed the day you start using CHG soap. Use a clean washcloth (not used since being washed) for each shower. DO NOT sleep with pets once you start using the CHG.   CHG Shower Instructions:  If you choose to wash your hair and private area, wash first with your normal shampoo/soap.  After you use shampoo/soap, rinse your hair and body thoroughly to remove shampoo/soap residue.  Turn the water OFF and apply about 3 tablespoons (45 ml) of CHG soap to a CLEAN washcloth.   Apply CHG soap ONLY FROM YOUR NECK DOWN TO YOUR TOES (washing for 3-5 minutes)  DO NOT use CHG soap on face, private areas, open wounds, or sores.  Pay special attention to the area where your surgery is being performed.  If you are having back surgery, having someone wash your back for you may be helpful. Wait 2 minutes after CHG soap is applied, then you may rinse off the CHG soap.  Pat dry with a clean towel  Put on clean clothes/pajamas   If you choose to wear lotion, please use ONLY the CHG-compatible lotions on the back of this paper.     Additional instructions for the day of surgery: DO NOT APPLY any lotions, deodorants, cologne, or perfumes.   Put on clean/comfortable clothes.  Brush your teeth.  Ask your nurse before applying any prescription medications to the skin.      CHG Compatible Lotions   Aveeno Moisturizing lotion  Cetaphil Moisturizing Cream  Cetaphil Moisturizing Lotion  Clairol Herbal Essence Moisturizing Lotion, Dry Skin  Clairol Herbal Essence Moisturizing Lotion, Extra Dry Skin  Clairol Herbal Essence Moisturizing Lotion, Normal Skin  Curel Age Defying Therapeutic Moisturizing Lotion with Alpha Hydroxy  Curel Extreme Care Body Lotion  Curel Soothing Hands Moisturizing Hand Lotion  Curel Therapeutic Moisturizing Cream, Fragrance-Free  Curel Therapeutic Moisturizing Lotion, Fragrance-Free  Curel Therapeutic Moisturizing Lotion, Original Formula  Eucerin Daily Replenishing Lotion  Eucerin Dry Skin Therapy Plus Alpha Hydroxy Crme  Eucerin Dry Skin Therapy Plus Alpha Hydroxy Lotion  Eucerin Original Crme  Eucerin Original Lotion  Eucerin Plus Crme Eucerin Plus Lotion  Eucerin TriLipid Replenishing Lotion  Keri Anti-Bacterial Hand Lotion  Keri Deep Conditioning Original Lotion Dry Skin Formula Softly Scented  Keri Deep Conditioning Original Lotion, Fragrance Free Sensitive Skin Formula  Keri Lotion Fast Absorbing Fragrance Free Sensitive Skin  Formula  Keri Lotion Fast Absorbing Softly Scented Dry Skin Formula  Keri Original Lotion  Keri Skin Renewal Lotion Keri Silky Smooth Lotion  Keri Silky Smooth Sensitive Skin Lotion  Nivea Body Creamy Conditioning Oil  Nivea Body Extra Enriched Teacher, adult education Moisturizing Lotion Nivea Crme  Nivea Skin Firming Lotion  NutraDerm 30 Skin Lotion  NutraDerm Skin Lotion  NutraDerm Therapeutic Skin Cream  NutraDerm Therapeutic Skin Lotion  ProShield Protective Hand Cream  Provon moisturizing lotion

## 2024-08-17 NOTE — H&P (Signed)
 TOTAL KNEE ADMISSION H&P  Patient is being admitted for left total knee arthroplasty.  Subjective:  Chief Complaint: Left knee pain.  HPI: Amanda [name], 78 y.o. female has a history of pain and functional disability in the left knee due to arthritis and has failed non-surgical conservative treatments for greater than 12 weeks to include NSAID's and/or analgesics, corticosteriod injections, and activity modification. Onset of symptoms was gradual, starting >10 years ago with gradually worsening course since that time. The patient noted no past surgery on the left knee.  Patient currently rates pain in the left knee at 8 out of 10 with activity. Patient has night pain, worsening of pain with activity and weight bearing, pain with passive range of motion, and crepitus. Patient has evidence of severe bone-on-bone arthritis in the lateral compartment with significant valgus deformity. Patellofemoral narrowing is also noted by imaging studies. There is no active infection.  Patient Active Problem List   Diagnosis Date Noted   Osteoarthritis of left knee 06/04/2024   History of skin cancer 03/30/2018   Pancytopenia, acquired (HCC) 12/18/2017   Nausea without vomiting 10/10/2017   Mild protein-calorie malnutrition 10/10/2017   Skin rash 08/28/2017   Other constipation 08/22/2017   Port-A-Cath in place 08/21/2017   Goals of care, counseling/discussion 08/06/2017   Thrombocytopenia 06/06/2016   CLL (chronic lymphocytic leukemia) (HCC) 08/31/2013   Lymphocytosis     Past Medical History:  Diagnosis Date   A-fib (HCC)    Adenopathy    Atrial fibrillation (HCC)    CLL (chronic lymphocytic leukemia) (HCC) 08/31/2013   HTN (hypertension)    Hyperlipidemia    Hypertensive retinopathy    Lymphocytosis    Macular degeneration    OU    Past Surgical History:  Procedure Laterality Date   ABDOMINAL HYSTERECTOMY     endometriosis, fibroid   APPENDECTOMY     BREAST BIOPSY     benign cyst.    CATARACT EXTRACTION Bilateral 2019   CHOLECYSTECTOMY     COLONOSCOPY  11/2012   Per Dr. Donnald.  neg.    EYE SURGERY     IR FLUORO GUIDE PORT INSERTION RIGHT  08/12/2017   IR REMOVAL TUN ACCESS W/ PORT W/O FL MOD SED  01/29/2018   IR US  GUIDE VASC ACCESS RIGHT  08/12/2017   SQUAMOUS CELL CARCINOMA EXCISION  12/28/2020   Excised from top of head   THUMB ARTHROSCOPY     TOTAL HIP ARTHROPLASTY  2010    Prior to Admission medications   Medication Sig Start Date End Date Taking? Authorizing Provider  acalabrutinib  maleate (CALQUENCE ) 100 MG tablet Take 1 tablet (100 mg total) by mouth 2 (two) times daily. 02/23/24   Lonn Hicks, MD  acetaminophen  (TYLENOL ) 500 MG tablet Take 500 mg by mouth every 6 (six) hours as needed for pain.    [provider]  acyclovir  (ZOVIRAX ) 400 MG tablet Take 1 tablet (400 mg total) by mouth daily. 05/12/24   Lonn Hicks, MD  atorvastatin (LIPITOR) 40 MG tablet Take 1 tablet by mouth daily.    [provider]  Calcium Carb-Cholecalciferol (CALCIUM + D3) 600-200 MG-UNIT TABS Take 1 tablet by mouth 3 (three) times a week.     [provider]  fluticasone (FLONASE) 50 MCG/ACT nasal spray Place 2 sprays into the nose daily. 01/07/13   [provider]  folic acid (FOLVITE) 1 MG tablet Take 1 mg by mouth daily.    [provider]  hydrochlorothiazide (HYDRODIURIL) 25 MG  tablet Take 25 mg by mouth daily. 11/07/12   [provider]  Multiple Vitamin (MULTIVITAMIN) tablet Take 1 tablet by mouth daily.    [provider]  Multiple Vitamins-Minerals (EYE VITAMINS & MINERALS) TABS Take by mouth.    [provider]  potassium chloride SA (K-DUR,KLOR-CON) 20 MEQ tablet Take 20 mEq by mouth 3 (three) times a week. Mon, Wed, Fri 11/07/12   [provider]    Allergies  Allergen Reactions   Codeine Nausea And Vomiting   Demerol [Meperidine] Nausea And Vomiting   Morphine And Codeine Nausea And  Vomiting   Ciprofloxacin Rash   Sulfa Antibiotics Rash    Social History   Socioeconomic History   Marital status: Widowed    Spouse name: Not on file   Number of children: 2   Years of education: Not on file   Highest education level: Not on file  Occupational History   Occupation: retired Engineer, civil (consulting)    Comment: retired [employer]; med surgery.   Tobacco Use   Smoking status: Never   Smokeless tobacco: Never  Vaping Use   Vaping status: Never Used  Substance and Sexual Activity   Alcohol use: No   Drug use: No   Sexual activity: Not on file  Other Topics Concern   Not on file  Social History Narrative   Niece Mada Sadik is MPOA   Social Drivers of Corporate investment banker Strain: Not on file  Food Insecurity: Not on file  Transportation Needs: Not on file  Physical Activity: Not on file  Stress: Not on file  Social Connections: Not on file  Intimate Partner Violence: Not on file    Tobacco Use: Low Risk  (07/21/2024)   Patient History    Smoking Tobacco Use: Never    Smokeless Tobacco Use: Never    Passive Exposure: Not on file   Social History   Substance and Sexual Activity  Alcohol Use No    Family History  Problem Relation Age of Onset   Renal Disease Mother    Cancer Mother        colon cancer   Cancer Father        colon cancer    Review of Systems  Constitutional:  Negative for chills and fever.  HENT:  Negative for congestion, sore throat and tinnitus.   Eyes:  Negative for double vision, photophobia and pain.  Respiratory:  Negative for cough, shortness of breath and wheezing.   Cardiovascular:  Negative for chest pain, palpitations and orthopnea.  Gastrointestinal:  Negative for heartburn, nausea and vomiting.  Genitourinary:  Negative for dysuria, frequency and urgency.  Musculoskeletal:  Positive for joint pain.  Neurological:  Negative for dizziness, weakness and headaches.    Objective:  Physical Exam:  Well nourished and well  developed.  General: Alert and oriented x3, cooperative and pleasant, no acute distress.  Head: normocephalic, atraumatic, neck supple.  Eyes: EOMI.  Musculoskeletal:  Left Knee: Significant valgus deformity noted. No effusion present. Range of motion measured at 5-125 degrees with crepitus on range of motion. Tenderness noted laterally; no medial tenderness or instability observed.  Calves soft and nontender. Motor function intact in LE. Strength 5/5 LE bilaterally. Neuro: Distal pulses 2+. Sensation to light touch intact in LE.  Imaging Review Plain radiographs demonstrate severe degenerative joint disease of the left knee. The overall alignment is significant valgus. The bone quality appears to be adequate for age and reported activity level.  Assessment/Plan:  End stage arthritis, left knee   The patient history, physical examination, clinical judgment of the provider and imaging studies are consistent with end stage degenerative joint disease of the left knee and total knee arthroplasty is deemed medically necessary. The treatment options including medical management, injection therapy arthroscopy and arthroplasty were discussed at length. The risks and benefits of total knee arthroplasty were presented and reviewed. The risks due to aseptic loosening, infection, stiffness, patella tracking problems, thromboembolic complications and other imponderables were discussed. The patient acknowledged the explanation, agreed to proceed with the plan and consent was signed. Patient is being admitted for inpatient treatment for surgery, pain control, PT, OT, prophylactic antibiotics, VTE prophylaxis, progressive ambulation and ADLs and discharge planning. The patient is planning to be discharged home.   Patient's anticipated LOS is less than 2 midnights, meeting these requirements: - Lives within 1 hour of care - Has a competent adult at home to recover with post-op recover - NO history of  -  Chronic pain requiring opiods  - Diabetes  - Coronary Artery Disease  - Heart failure  - Heart attack  - Stroke  - DVT/VTE  - Cardiac arrhythmia  - Respiratory Failure/COPD  - Renal failure  - Anemia  - Advanced Liver disease  Therapy Plans: Outpatient therapy at [facility] Disposition: [place] Planned DVT Prophylaxis: Xarelto 10 mg QD DME Needed: Vannie PCP: Sari Pay, MD (clearance received) Oncologist: Dr. Almarie Bedford (clearance received) TXA: IV Allergies: Codeine & morphine (N/V), ciprofloxacin, sulfa Metal Allergy: None Anesthesia Concerns: N/V BMI: 26 Last HgbA1c: Not diabetic Pain Regimen: Dilaudid (tramadol ineffective previously) [facility]acy: [facility]  Other: - Hx CLL, 2 months into remission - Per Dr. Bedford, will stop chemo 1 week prior to surgery. Will not restart until after seeing her postoperatively.   - Patient was instructed on what medications to stop prior to surgery. - Follow-up visit in 2 weeks with Dr. Melodi - Begin physical therapy following surgery - Pre-operative lab work as pre-surgical testing - Prescriptions will be provided in hospital at time of discharge  Roxie Mess, PA-C Orthopedic Surgery [facility] [place]

## 2024-08-18 ENCOUNTER — Encounter (HOSPITAL_COMMUNITY): Payer: Self-pay

## 2024-08-18 ENCOUNTER — Encounter (HOSPITAL_COMMUNITY)
Admission: RE | Admit: 2024-08-18 | Discharge: 2024-08-18 | Disposition: A | Discharge status: Home / Self Care | Source: Ambulatory Visit | Attending: Orthopedic Surgery | Admitting: Orthopedic Surgery

## 2024-08-18 ENCOUNTER — Other Ambulatory Visit: Payer: Self-pay

## 2024-08-18 DIAGNOSIS — M1712 Unilateral primary osteoarthritis, left knee: Secondary | ICD-10-CM | POA: Diagnosis not present

## 2024-08-18 DIAGNOSIS — I10 Essential (primary) hypertension: Secondary | ICD-10-CM | POA: Diagnosis not present

## 2024-08-18 DIAGNOSIS — Z87891 Personal history of nicotine dependence: Secondary | ICD-10-CM | POA: Insufficient documentation

## 2024-08-18 DIAGNOSIS — Z01818 Encounter for other preprocedural examination: Secondary | ICD-10-CM | POA: Diagnosis not present

## 2024-08-18 DIAGNOSIS — I480 Paroxysmal atrial fibrillation: Secondary | ICD-10-CM | POA: Diagnosis not present

## 2024-08-18 DIAGNOSIS — Z01810 Encounter for preprocedural cardiovascular examination: Secondary | ICD-10-CM | POA: Diagnosis present

## 2024-08-18 DIAGNOSIS — C9112 Chronic lymphocytic leukemia of B-cell type in relapse: Secondary | ICD-10-CM | POA: Insufficient documentation

## 2024-08-18 DIAGNOSIS — Z01812 Encounter for preprocedural laboratory examination: Secondary | ICD-10-CM | POA: Diagnosis present

## 2024-08-18 HISTORY — DX: Cardiac murmur, unspecified: R01.1

## 2024-08-18 HISTORY — DX: Nausea with vomiting, unspecified: Z98.890

## 2024-08-18 HISTORY — DX: Cardiac arrhythmia, unspecified: I49.9

## 2024-08-18 HISTORY — DX: Nausea with vomiting, unspecified: R11.2

## 2024-08-18 HISTORY — DX: Unspecified osteoarthritis, unspecified site: M19.90

## 2024-08-18 LAB — BASIC METABOLIC PANEL WITH GFR
Anion gap: 10 (ref 5–15)
BUN: 17 mg/dL (ref 8–23)
CO2: 25 mmol/L (ref 22–32)
Calcium: 10 mg/dL (ref 8.9–10.3)
Chloride: 104 mmol/L (ref 98–111)
Creatinine, Ser: 0.87 mg/dL (ref 0.44–1.00)
GFR, Estimated: >60 mL/min (ref >60)
Glucose, Bld: 94 mg/dL (ref 70–99)
Potassium: 3.9 mmol/L (ref 3.5–5.1)
Sodium: 139 mmol/L (ref 135–145)

## 2024-08-18 LAB — CBC
HCT: 42.2 % (ref 36.0–46.0)
Hemoglobin: 13.3 g/dL (ref 12.0–15.0)
MCH: 27.7 pg (ref 26.0–34.0)
MCHC: 31.5 g/dL (ref 30.0–36.0)
MCV: 87.9 fL (ref 80.0–100.0)
Platelets: 196 K/uL (ref 150–400)
RBC: 4.8 MIL/uL (ref 3.87–5.11)
RDW: 13.5 % (ref 11.5–15.5)
WBC: 11.1 K/uL — ABNORMAL HIGH (ref 4.0–10.5)
nRBC: 0 % (ref 0.0–0.2)

## 2024-08-18 LAB — SURGICAL PCR SCREEN
MRSA, PCR: NEGATIVE
Staphylococcus aureus: NEGATIVE

## 2024-08-20 ENCOUNTER — Encounter (HOSPITAL_COMMUNITY): Payer: Self-pay

## 2024-08-20 NOTE — Anesthesia Preprocedure Evaluation (Addendum)
 Anesthesia Evaluation  Patient identified by MRN, date of birth, ID band Patient awake    Reviewed: Allergy & Precautions, H&P , NPO status , Patient's Chart, lab work & pertinent test results  History of Anesthesia Complications (+) PONV and history of anesthetic complications  Airway Mallampati: II  TM Distance: >3 FB Neck ROM: Full    Dental no notable dental hx. (+) Teeth Intact, Dental Advisory Given   Pulmonary former smoker   Pulmonary exam normal breath sounds clear to auscultation       Cardiovascular hypertension, Pt. on medications + dysrhythmias Atrial Fibrillation  Rhythm:Regular Rate:Normal     Neuro/Psych negative neurological ROS  negative psych ROS   GI/Hepatic negative GI ROS, Neg liver ROS,,,  Endo/Other  negative endocrine ROS    Renal/GU negative Renal ROS  negative genitourinary   Musculoskeletal  (+) Arthritis , Osteoarthritis,    Abdominal   Peds  Hematology  (+) Blood dyscrasia, anemia   Anesthesia Other Findings   Reproductive/Obstetrics negative OB ROS                              Anesthesia Physical Anesthesia Plan  ASA: 2  Anesthesia Plan: Spinal   Post-op Pain Management: Regional block* and Tylenol  PO (pre-op)*   Induction: Intravenous  PONV Risk Score and Plan: 4 or greater and Ondansetron , Dexamethasone  and Propofol infusion  Airway Management Planned: Natural Airway and Simple Face Mask  Additional Equipment:   Intra-op Plan:   Post-operative Plan:   Informed Consent: I have reviewed the patients History and Physical, chart, labs and discussed the procedure including the risks, benefits and alternatives for the proposed anesthesia with the patient or authorized representative who has indicated his/her understanding and acceptance.     Dental advisory given  Plan Discussed with: CRNA  Anesthesia Plan Comments: (See PAT note from  10/8 )         Anesthesia Quick Evaluation

## 2024-08-20 NOTE — Progress Notes (Signed)
 Case: [id number] Date/Time: 08/30/24 0910   Procedure: ARTHROPLASTY, KNEE, TOTAL (Left: Knee)   Anesthesia type: Choice   Pre-op diagnosis: Left knee osteoarthritis   Location: Amanda [name] ROOM 09 / [facility] ORS   Surgeons: Amanda Lerner, MD       DISCUSSION: Amanda [name] is an 78 yo female with PMH of former smoking, HTN, A.fib, CLL, arthritis.  Prior anesthesia complications include PONV  Patient with history of paroxysmal A-fib.  She previously followed with cardiology and per notes she was initially diagnosed in 2012 and converted back to normal sinus rhythm during the same admission.  She has not had any further episodes.  She was last seen on 04/21/2023 and advised that since she has had no recurrence over 10 years she could DC her beta-blocker.  She was also advised to follow-up as needed  History of CLL followed by oncology.  Last seen on/10/25. She was treated with Bendamustine  and rituximab  between October 2018 to January 2019 but relapsed in 2024. She has been on maintenance Calquence  since May 2024. CT imaging from July 2025 showed complete response to therapy.  Per Dr. Carmie such regarding surgery:  In anticipation for future surgery, I recommend the patient to hold Calquence  a week before her knee replacement surgery and I will see her the following week after her knee replacement to determine whether it is safe to resume treatment  VS:  Wt Readings from Last 3 Encounters:  06/04/24 65.7 kg  05/20/24 64.7 kg  02/19/24 64.3 kg   Temp Readings from Last 3 Encounters:  06/04/24 37 C (Tympanic)  05/20/24 36.5 C (Tympanic)  02/19/24 36.5 C (Tympanic)   BP Readings from Last 3 Encounters:  06/04/24 126/82  05/20/24 136/61  02/19/24 137/68   Pulse Readings from Last 3 Encounters:  06/04/24 83  05/20/24 67  02/19/24 67     PROVIDERS: Amanda Harvey, MD   LABS: Labs reviewed: Acceptable for surgery. (all labs ordered are listed, but only abnormal results are  displayed)  Labs Reviewed  CBC - Abnormal; Notable for the following components:      Result Value   WBC 11.1 (*)    All other components within normal limits  SURGICAL PCR SCREEN  BASIC METABOLIC PANEL WITH GFR    CT chest/abdomen/pelvis 05/27/2024:  IMPRESSION: 1. No acute intrathoracic, abdominal, or pelvic pathology. No adenopathy or splenomegaly. 2. Colonic diverticulosis. No bowel obstruction. 3.  Aortic Atherosclerosis (ICD10-I70.0).   EKG 08/18/2024:  Normal sinus rhythm   CV:  Past Medical History:  Diagnosis Date   A-fib (HCC)    Adenopathy    Arthritis    Atrial fibrillation (HCC)    CLL (chronic lymphocytic leukemia) (HCC) 08/31/2013   Dysrhythmia    hx of atrial fib none in 10 years per pt   Heart murmur    HTN (hypertension)    taken off of meds 06/2024 due to blood pressure low per pt   Hyperlipidemia    Hypertensive retinopathy    Lymphocytosis    Macular degeneration    OU   PONV (postoperative nausea and vomiting)     Past Surgical History:  Procedure Laterality Date   ABDOMINAL HYSTERECTOMY     endometriosis, fibroid   APPENDECTOMY     BREAST BIOPSY     benign cyst.   CATARACT EXTRACTION Bilateral 2019   CHOLECYSTECTOMY     COLONOSCOPY  11/2012   Per Dr. Donnald.  neg.    EYE SURGERY     IR  FLUORO GUIDE PORT INSERTION RIGHT  08/12/2017   IR REMOVAL TUN ACCESS W/ PORT W/O FL MOD SED  01/29/2018   IR US  GUIDE VASC ACCESS RIGHT  08/12/2017   SQUAMOUS CELL CARCINOMA EXCISION  12/28/2020   Excised from top of head   THUMB ARTHROSCOPY     TOTAL HIP ARTHROPLASTY  2010    MEDICATIONS:  acalabrutinib  maleate (CALQUENCE ) 100 MG tablet   acetaminophen  (TYLENOL ) 500 MG tablet   acyclovir  (ZOVIRAX ) 400 MG tablet   atorvastatin (LIPITOR) 40 MG tablet   Calcium Carb-Cholecalciferol (CALCIUM + D3) 600-200 MG-UNIT TABS   fluticasone (FLONASE) 50 MCG/ACT nasal spray   folic acid (FOLVITE) 400 MCG tablet   hydrochlorothiazide (HYDRODIURIL) 25 MG  tablet   hydroxypropyl methylcellulose / hypromellose (ISOPTO TEARS / GONIOVISC) 2.5 % ophthalmic solution   Multiple Vitamin (MULTIVITAMIN) tablet   Multiple Vitamins-Minerals (EYE VITAMINS & MINERALS) TABS   potassium chloride SA (K-DUR,KLOR-CON) 20 MEQ tablet   No current facility-administered medications for this encounter.   Amanda [name] Amanda [name]/[facility] Surgical Short Stay/Anesthesiology [facility] Phone [phone] 08/20/2024 1:46 PM

## 2024-08-27 ENCOUNTER — Other Ambulatory Visit: Payer: Self-pay

## 2024-08-27 ENCOUNTER — Other Ambulatory Visit (HOSPITAL_COMMUNITY): Payer: Self-pay

## 2024-08-30 ENCOUNTER — Encounter (HOSPITAL_COMMUNITY)
Admission: RE | Disposition: A | Discharge status: Home / Self Care | Payer: Self-pay | Source: Home / Self Care | Attending: Orthopedic Surgery

## 2024-08-30 ENCOUNTER — Observation Stay (HOSPITAL_COMMUNITY)
Admission: RE | Admit: 2024-08-30 | Discharge: 2024-08-31 | Disposition: A | Discharge status: Home / Self Care | Attending: Orthopedic Surgery | Admitting: Orthopedic Surgery

## 2024-08-30 ENCOUNTER — Ambulatory Visit (HOSPITAL_COMMUNITY): Payer: Self-pay | Admitting: Medical

## 2024-08-30 ENCOUNTER — Ambulatory Visit (HOSPITAL_COMMUNITY): Admitting: Certified Registered"

## 2024-08-30 ENCOUNTER — Other Ambulatory Visit: Payer: Self-pay

## 2024-08-30 ENCOUNTER — Encounter (HOSPITAL_COMMUNITY): Payer: Self-pay | Admitting: Orthopedic Surgery

## 2024-08-30 DIAGNOSIS — I10 Essential (primary) hypertension: Secondary | ICD-10-CM | POA: Insufficient documentation

## 2024-08-30 DIAGNOSIS — G8918 Other acute postprocedural pain: Secondary | ICD-10-CM | POA: Diagnosis not present

## 2024-08-30 DIAGNOSIS — M179 Osteoarthritis of knee, unspecified: Principal | ICD-10-CM | POA: Diagnosis present

## 2024-08-30 DIAGNOSIS — I480 Paroxysmal atrial fibrillation: Secondary | ICD-10-CM | POA: Diagnosis not present

## 2024-08-30 DIAGNOSIS — Z85828 Personal history of other malignant neoplasm of skin: Secondary | ICD-10-CM | POA: Insufficient documentation

## 2024-08-30 DIAGNOSIS — Z87891 Personal history of nicotine dependence: Secondary | ICD-10-CM | POA: Insufficient documentation

## 2024-08-30 DIAGNOSIS — M1712 Unilateral primary osteoarthritis, left knee: Secondary | ICD-10-CM | POA: Diagnosis not present

## 2024-08-30 DIAGNOSIS — Z01818 Encounter for other preprocedural examination: Secondary | ICD-10-CM

## 2024-08-30 DIAGNOSIS — Z8571 Personal history of Hodgkin lymphoma: Secondary | ICD-10-CM | POA: Insufficient documentation

## 2024-08-30 DIAGNOSIS — Z7901 Long term (current) use of anticoagulants: Secondary | ICD-10-CM | POA: Diagnosis not present

## 2024-08-30 DIAGNOSIS — Z79899 Other long term (current) drug therapy: Secondary | ICD-10-CM | POA: Insufficient documentation

## 2024-08-30 DIAGNOSIS — M25562 Pain in left knee: Secondary | ICD-10-CM | POA: Diagnosis present

## 2024-08-30 HISTORY — PX: TOTAL KNEE ARTHROPLASTY: SHX125

## 2024-08-30 SURGERY — ARTHROPLASTY, KNEE, TOTAL
Anesthesia: Spinal | Site: Knee | Laterality: Left

## 2024-08-30 MED ORDER — SODIUM CHLORIDE (PF) 0.9 % IJ SOLN
INTRAMUSCULAR | Status: AC
  Filled 2024-08-30: qty 10

## 2024-08-30 MED ORDER — ONDANSETRON HCL 4 MG/2ML IJ SOLN: 4.0000 mg | Freq: Four times a day (QID) | INTRAMUSCULAR | Status: DC | PRN

## 2024-08-30 MED ORDER — HYDROMORPHONE HCL 1 MG/ML IJ SOLN
0.5000 mg | INTRAMUSCULAR | Status: DC | PRN
  Administered 2024-08-31: 1 mg via INTRAVENOUS
  Filled 2024-08-30: qty 1

## 2024-08-30 MED ORDER — HYPROMELLOSE (GONIOSCOPIC) 2.5 % OP SOLN: 1.0000 [drp] | OPHTHALMIC | Status: DC | PRN

## 2024-08-30 MED ORDER — POVIDONE-IODINE 10 % EX SWAB: 2.0000 | Freq: Once | CUTANEOUS | Status: DC

## 2024-08-30 MED ORDER — BUPIVACAINE-EPINEPHRINE (PF) 0.5% -1:200000 IJ SOLN
INTRAMUSCULAR | Status: DC | PRN
  Administered 2024-08-30: 20 mL via PERINEURAL

## 2024-08-30 MED ORDER — HYDROCHLOROTHIAZIDE 25 MG PO TABS
25.0000 mg | ORAL_TABLET | Freq: Every day | ORAL | Status: DC
  Administered 2024-08-31: 25 mg via ORAL
  Filled 2024-08-30: qty 1

## 2024-08-30 MED ORDER — PHENOL 1.4 % MT LIQD: 1.0000 | OROMUCOSAL | Status: DC | PRN

## 2024-08-30 MED ORDER — HYDROMORPHONE HCL 2 MG PO TABS: 1.0000 mg | ORAL_TABLET | ORAL | Status: DC | PRN

## 2024-08-30 MED ORDER — FENTANYL CITRATE (PF) 50 MCG/ML IJ SOSY
50.0000 ug | PREFILLED_SYRINGE | INTRAMUSCULAR | Status: DC
  Administered 2024-08-30: 50 ug via INTRAVENOUS
  Filled 2024-08-30: qty 2

## 2024-08-30 MED ORDER — POLYETHYLENE GLYCOL 3350 17 G PO PACK: 17.0000 g | PACK | Freq: Every day | ORAL | Status: DC | PRN

## 2024-08-30 MED ORDER — BUPIVACAINE LIPOSOME 1.3 % IJ SUSP
INTRAMUSCULAR | Status: DC | PRN
  Administered 2024-08-30: 20 mL

## 2024-08-30 MED ORDER — DEXAMETHASONE SOD PHOSPHATE PF 10 MG/ML IJ SOLN
10.0000 mg | Freq: Once | INTRAMUSCULAR | Status: AC
  Administered 2024-08-31: 10 mg via INTRAVENOUS

## 2024-08-30 MED ORDER — METHOCARBAMOL 1000 MG/10ML IJ SOLN: 500.0000 mg | Freq: Four times a day (QID) | INTRAMUSCULAR | Status: DC | PRN

## 2024-08-30 MED ORDER — METHOCARBAMOL 500 MG PO TABS
500.0000 mg | ORAL_TABLET | Freq: Four times a day (QID) | ORAL | Status: DC | PRN
Start: 2024-08-30 — End: 2024-08-31
  Administered 2024-08-30 – 2024-08-31 (×4): 500 mg via ORAL
  Filled 2024-08-30 (×5): qty 1

## 2024-08-30 MED ORDER — CHLORHEXIDINE GLUCONATE 0.12 % MT SOLN
15.0000 mL | Freq: Once | OROMUCOSAL | Status: AC
  Administered 2024-08-30: 15 mL via OROMUCOSAL

## 2024-08-30 MED ORDER — PROPOFOL 1000 MG/100ML IV EMUL
INTRAVENOUS | Status: AC
  Filled 2024-08-30: qty 100

## 2024-08-30 MED ORDER — DIPHENHYDRAMINE HCL 12.5 MG/5ML PO ELIX: 12.5000 mg | ORAL_SOLUTION | ORAL | Status: DC | PRN

## 2024-08-30 MED ORDER — FLEET ENEMA RE ENEM: 1.0000 | ENEMA | Freq: Once | RECTAL | Status: DC | PRN

## 2024-08-30 MED ORDER — ORAL CARE MOUTH RINSE: 15.0000 mL | Freq: Once | OROMUCOSAL | Status: AC

## 2024-08-30 MED ORDER — LACTATED RINGERS IV SOLN: INTRAVENOUS | Status: DC

## 2024-08-30 MED ORDER — STERILE WATER FOR IRRIGATION IR SOLN
Status: DC | PRN
Start: 2024-08-30 — End: 2024-08-30
  Administered 2024-08-30: 1000 mL

## 2024-08-30 MED ORDER — SODIUM CHLORIDE 0.9 % IR SOLN
Status: DC | PRN
  Administered 2024-08-30: 1000 mL

## 2024-08-30 MED ORDER — ACYCLOVIR 400 MG PO TABS
400.0000 mg | ORAL_TABLET | Freq: Every day | ORAL | Status: DC
Start: 2024-08-31 — End: 2024-08-31
  Administered 2024-08-31: 400 mg via ORAL
  Filled 2024-08-30: qty 1

## 2024-08-30 MED ORDER — POTASSIUM CHLORIDE CRYS ER 20 MEQ PO TBCR
20.0000 meq | EXTENDED_RELEASE_TABLET | ORAL | Status: DC
  Administered 2024-08-30: 20 meq via ORAL
  Filled 2024-08-30: qty 1

## 2024-08-30 MED ORDER — RIVAROXABAN 10 MG PO TABS
10.0000 mg | ORAL_TABLET | Freq: Every day | ORAL | Status: DC
  Administered 2024-08-31: 10 mg via ORAL
  Filled 2024-08-30: qty 1

## 2024-08-30 MED ORDER — SODIUM CHLORIDE (PF) 0.9 % IJ SOLN
INTRAMUSCULAR | Status: DC | PRN
  Administered 2024-08-30: 60 mL

## 2024-08-30 MED ORDER — SODIUM CHLORIDE (PF) 0.9 % IJ SOLN
INTRAMUSCULAR | Status: AC
  Filled 2024-08-30: qty 50

## 2024-08-30 MED ORDER — ACETAMINOPHEN 10 MG/ML IV SOLN
1000.0000 mg | Freq: Four times a day (QID) | INTRAVENOUS | Status: AC
  Administered 2024-08-30: 1000 mg via INTRAVENOUS
  Filled 2024-08-30 (×3): qty 100

## 2024-08-30 MED ORDER — CEFAZOLIN SODIUM-DEXTROSE 2-4 GM/100ML-% IV SOLN
2.0000 g | INTRAVENOUS | Status: AC
  Administered 2024-08-30: 2 g via INTRAVENOUS
  Filled 2024-08-30: qty 100

## 2024-08-30 MED ORDER — FENTANYL CITRATE (PF) 50 MCG/ML IJ SOSY: 25.0000 ug | PREFILLED_SYRINGE | INTRAMUSCULAR | Status: DC | PRN

## 2024-08-30 MED ORDER — CEFAZOLIN SODIUM-DEXTROSE 2-4 GM/100ML-% IV SOLN
2.0000 g | Freq: Four times a day (QID) | INTRAVENOUS | Status: AC
  Administered 2024-08-30 (×2): 2 g via INTRAVENOUS
  Filled 2024-08-30 (×2): qty 100

## 2024-08-30 MED ORDER — MENTHOL 3 MG MT LOZG: 1.0000 | LOZENGE | OROMUCOSAL | Status: DC | PRN

## 2024-08-30 MED ORDER — FLUTICASONE PROPIONATE 50 MCG/ACT NA SUSP
2.0000 | Freq: Every day | NASAL | Status: DC
  Administered 2024-08-31: 2 via NASAL
  Filled 2024-08-30: qty 16

## 2024-08-30 MED ORDER — ONDANSETRON HCL 4 MG/2ML IJ SOLN
INTRAMUSCULAR | Status: DC | PRN
  Administered 2024-08-30: 4 mg via INTRAVENOUS

## 2024-08-30 MED ORDER — HYDROMORPHONE HCL 2 MG PO TABS
2.0000 mg | ORAL_TABLET | ORAL | Status: DC | PRN
  Administered 2024-08-30: 2 mg via ORAL
  Administered 2024-08-30 – 2024-08-31 (×5): 3 mg via ORAL
  Filled 2024-08-30 (×4): qty 2
  Filled 2024-08-30: qty 1
  Filled 2024-08-30 (×2): qty 2

## 2024-08-30 MED ORDER — PROPOFOL 10 MG/ML IV BOLUS
INTRAVENOUS | Status: DC | PRN
  Administered 2024-08-30: 30 mg via INTRAVENOUS

## 2024-08-30 MED ORDER — DEXAMETHASONE SOD PHOSPHATE PF 10 MG/ML IJ SOLN
8.0000 mg | Freq: Once | INTRAMUSCULAR | Status: AC
  Administered 2024-08-30: 8 mg via INTRAVENOUS

## 2024-08-30 MED ORDER — MEPIVACAINE HCL (PF) 2 % IJ SOLN
INTRAMUSCULAR | Status: DC | PRN
  Administered 2024-08-30: 60 mg via INTRATHECAL

## 2024-08-30 MED ORDER — TRANEXAMIC ACID-NACL 1000-0.7 MG/100ML-% IV SOLN
1000.0000 mg | INTRAVENOUS | Status: AC
  Administered 2024-08-30: 1000 mg via INTRAVENOUS
  Filled 2024-08-30: qty 100

## 2024-08-30 MED ORDER — BISACODYL 10 MG RE SUPP: 10.0000 mg | Freq: Every day | RECTAL | Status: DC | PRN

## 2024-08-30 MED ORDER — METOCLOPRAMIDE HCL 5 MG PO TABS: 5.0000 mg | ORAL_TABLET | Freq: Three times a day (TID) | ORAL | Status: DC | PRN

## 2024-08-30 MED ORDER — BUPIVACAINE LIPOSOME 1.3 % IJ SUSP
INTRAMUSCULAR | Status: AC
  Filled 2024-08-30: qty 20

## 2024-08-30 MED ORDER — SODIUM CHLORIDE 0.9 % IV SOLN: INTRAVENOUS | Status: DC

## 2024-08-30 MED ORDER — PROPOFOL 500 MG/50ML IV EMUL
INTRAVENOUS | Status: DC | PRN
  Administered 2024-08-30: 100 ug/kg/min via INTRAVENOUS

## 2024-08-30 MED ORDER — EPHEDRINE 5 MG/ML INJ
INTRAVENOUS | Status: AC
  Filled 2024-08-30: qty 5

## 2024-08-30 MED ORDER — ACETAMINOPHEN 500 MG PO TABS
1000.0000 mg | ORAL_TABLET | Freq: Four times a day (QID) | ORAL | Status: AC
  Administered 2024-08-30 – 2024-08-31 (×4): 1000 mg via ORAL
  Filled 2024-08-30 (×4): qty 2

## 2024-08-30 MED ORDER — EPHEDRINE SULFATE-NACL 50-0.9 MG/10ML-% IV SOSY
PREFILLED_SYRINGE | INTRAVENOUS | Status: DC | PRN
  Administered 2024-08-30: 5 mg via INTRAVENOUS
  Administered 2024-08-30: 10 mg via INTRAVENOUS
  Administered 2024-08-30: 5 mg via INTRAVENOUS

## 2024-08-30 MED ORDER — DOCUSATE SODIUM 100 MG PO CAPS
100.0000 mg | ORAL_CAPSULE | Freq: Two times a day (BID) | ORAL | Status: DC
  Administered 2024-08-30 – 2024-08-31 (×2): 100 mg via ORAL
  Filled 2024-08-30 (×2): qty 1

## 2024-08-30 MED ORDER — ONDANSETRON HCL 4 MG PO TABS: 4.0000 mg | ORAL_TABLET | Freq: Four times a day (QID) | ORAL | Status: DC | PRN

## 2024-08-30 MED ORDER — ATORVASTATIN CALCIUM 40 MG PO TABS
40.0000 mg | ORAL_TABLET | Freq: Every day | ORAL | Status: DC
  Administered 2024-08-31: 40 mg via ORAL
  Filled 2024-08-30: qty 1

## 2024-08-30 MED ORDER — 0.9 % SODIUM CHLORIDE (POUR BTL) OPTIME
TOPICAL | Status: DC | PRN
  Administered 2024-08-30: 1000 mL

## 2024-08-30 MED ORDER — LACTATED RINGERS IV SOLN
INTRAVENOUS | Status: DC
Start: 2024-08-30 — End: 2024-08-30

## 2024-08-30 MED ORDER — BUPIVACAINE LIPOSOME 1.3 % IJ SUSP: 20.0000 mL | Freq: Once | INTRAMUSCULAR | Status: DC

## 2024-08-30 MED ORDER — ACETAMINOPHEN 325 MG PO TABS: 325.0000 mg | ORAL_TABLET | Freq: Four times a day (QID) | ORAL | Status: DC | PRN

## 2024-08-30 MED ORDER — POLYVINYL ALCOHOL 1.4 % OP SOLN: 1.0000 [drp] | OPHTHALMIC | Status: DC | PRN

## 2024-08-30 MED ORDER — METOCLOPRAMIDE HCL 5 MG/ML IJ SOLN: 5.0000 mg | Freq: Three times a day (TID) | INTRAMUSCULAR | Status: DC | PRN

## 2024-08-30 SURGICAL SUPPLY — 41 items
ATTUNE PS FEM LT SZ 5 CEM KNEE (Femur) IMPLANT
ATTUNE PSRP INSR SZ5 8 KNEE (Insert) IMPLANT
BAG COUNTER SPONGE SURGICOUNT (BAG) IMPLANT
BAG ZIPLOCK 12X15 (MISCELLANEOUS) ×2 IMPLANT
BASE TIBIAL ROT PLAT SZ 5 KNEE (Knees) IMPLANT
BLADE SAG 18X100X1.27 (BLADE) ×2 IMPLANT
BLADE SAW SGTL 11.0X1.19X90.0M (BLADE) ×2 IMPLANT
BNDG ELASTIC 6INX 5YD STR LF (GAUZE/BANDAGES/DRESSINGS) ×2 IMPLANT
BOWL SMART MIX CTS (DISPOSABLE) ×2 IMPLANT
CEMENT HV SMART SET (Cement) ×4 IMPLANT
COVER SURGICAL LIGHT HANDLE (MISCELLANEOUS) ×2 IMPLANT
CUFF TRNQT CYL 34X4.125X (TOURNIQUET CUFF) ×2 IMPLANT
DERMABOND ADVANCED .7 DNX12 (GAUZE/BANDAGES/DRESSINGS) ×2 IMPLANT
DRAPE U-SHAPE 47X51 STRL (DRAPES) ×2 IMPLANT
DRSG AQUACEL AG ADV 3.5X10 (GAUZE/BANDAGES/DRESSINGS) ×2 IMPLANT
DURAPREP 26ML APPLICATOR (WOUND CARE) ×2 IMPLANT
ELECT REM PT RETURN 15FT ADLT (MISCELLANEOUS) ×2 IMPLANT
GLOVE BIO SURGEON STRL SZ 6.5 (GLOVE) IMPLANT
GLOVE BIO SURGEON STRL SZ8 (GLOVE) ×2 IMPLANT
GLOVE BIOGEL PI IND STRL 7.0 (GLOVE) ×2 IMPLANT
GLOVE BIOGEL PI IND STRL 8 (GLOVE) ×2 IMPLANT
GOWN STRL REUS W/ TWL LRG LVL3 (GOWN DISPOSABLE) ×2 IMPLANT
HOLDER FOLEY CATH W/STRAP (MISCELLANEOUS) ×2 IMPLANT
IMMOBILIZER KNEE 20 THIGH 36 (SOFTGOODS) ×2 IMPLANT
KIT TURNOVER KIT A (KITS) ×2 IMPLANT
MANIFOLD NEPTUNE II (INSTRUMENTS) ×2 IMPLANT
NS IRRIG 1000ML POUR BTL (IV SOLUTION) ×2 IMPLANT
PACK TOTAL KNEE CUSTOM (KITS) ×2 IMPLANT
PADDING CAST COTTON 6X4 STRL (CAST SUPPLIES) ×4 IMPLANT
PATELLA MEDIAL ATTUN 35MM KNEE (Knees) IMPLANT
PENCIL SMOKE EVACUATOR (MISCELLANEOUS) ×2 IMPLANT
PIN STEINMAN FIXATION KNEE (PIN) IMPLANT
PROTECTOR NERVE ULNAR (MISCELLANEOUS) ×2 IMPLANT
SET HNDPC FAN SPRY TIP SCT (DISPOSABLE) ×2 IMPLANT
SUT MNCRL AB 4-0 PS2 18 (SUTURE) ×2 IMPLANT
SUT VIC AB 2-0 CT1 TAPERPNT 27 (SUTURE) ×6 IMPLANT
SUTURE STRATFX 0 PDS 27 VIOLET (SUTURE) ×2 IMPLANT
TOWEL GREEN STERILE FF (TOWEL DISPOSABLE) ×2 IMPLANT
TRAY FOLEY MTR SLVR 16FR STAT (SET/KITS/TRAYS/PACK) ×2 IMPLANT
TUBE SUCTION HIGH CAP CLEAR NV (SUCTIONS) ×2 IMPLANT
WRAP KNEE MAXI GEL POST OP (GAUZE/BANDAGES/DRESSINGS) ×2 IMPLANT

## 2024-08-30 NOTE — Transfer of Care (Signed)
 Immediate Anesthesia Transfer of Care Note  Patient: Amanda [name]  Procedure(s) Performed: ARTHROPLASTY, KNEE, TOTAL (Left: Knee)  Patient Location: PACU  Anesthesia Type:Regional and Spinal  Level of Consciousness: awake, alert , and oriented  Airway & Oxygen Therapy: Patient Spontanous Breathing and Patient connected to face mask oxygen  Post-op Assessment: Report given to RN and Post -op Vital signs reviewed and stable  Post vital signs: Reviewed and stable  Last Vitals:  Vitals Value Taken Time  BP 103/47 08/30/24 10:31  Temp    Pulse 72 08/30/24 10:34  Resp 21 08/30/24 10:34  SpO2 94 % 08/30/24 10:34  Vitals shown include unfiled device data.  Last Pain:  Vitals:   08/30/24 0829  TempSrc:   PainSc: 0-No pain         Complications: No notable events documented.

## 2024-08-30 NOTE — Anesthesia Postprocedure Evaluation (Signed)
 Anesthesia Post Note  Patient: TRICHELLE LEHAN  Procedure(s) Performed: ARTHROPLASTY, KNEE, TOTAL (Left: Knee)     Patient location during evaluation: PACU Anesthesia Type: Spinal and Regional Level of consciousness: oriented and awake and alert Pain management: pain level controlled Vital Signs Assessment: post-procedure vital signs reviewed and stable Respiratory status: spontaneous breathing and respiratory function stable Cardiovascular status: blood pressure returned to baseline and stable Postop Assessment: no headache, no backache, no apparent nausea or vomiting, spinal receding and patient able to bend at knees Anesthetic complications: no   No notable events documented.  Last Vitals:  Vitals:   08/30/24 1225 08/30/24 1308  BP: (!) 126/54 (!) 133/58  Pulse: (!) 54 (!) 54  Resp: 12 18  Temp: 36.4 C 36.8 C  SpO2: 96% 99%    Last Pain:  Vitals:   08/30/24 1330  TempSrc:   PainSc: 8                  Caniyah Murley,W. EDMOND

## 2024-08-30 NOTE — Evaluation (Signed)
 Physical Therapy Evaluation Patient Details Name: Amanda [name] MRN: [id number] DOB: 08/06/1946 Today's Date: 08/30/2024  History of Present Illness  Pt is 78 yo female s/p L TKA on 08/30/24.  Pt with hx including but not limited to OA, CLL, afib, THA 2010, and macular degeneration  Clinical Impression  Pt is s/p TKA resulting in the deficits listed below (see PT Problem List). At baseline, pt independent and resides at [facility].  Her daughter will be staying with her at d/c.  Today, pt motivated to work with therapy, good quad activation, and good pain control. She was able to ambulate 15' with light min A and is expected to progress well.  Pt will benefit from acute skilled PT to increase their independence and safety with mobility to allow discharge.          If plan is discharge home, recommend the following: A little help with walking and/or transfers;A little help with bathing/dressing/bathroom;Assistance with cooking/housework;Help with stairs or ramp for entrance   Can travel by private vehicle        Equipment Recommendations Rolling walker (2 wheels)  Recommendations for Other Services       Functional Status Assessment Patient has had a recent decline in their functional status and demonstrates the ability to make significant improvements in function in a reasonable and predictable amount of time.     Precautions / Restrictions Precautions Precautions: Fall;Knee Restrictions Weight Bearing Restrictions Per Provider Order: Yes LLE Weight Bearing Per Provider Order: Weight bearing as tolerated      Mobility  Bed Mobility Overal bed mobility: Needs Assistance Bed Mobility: Supine to Sit     Supine to sit: Min assist     General bed mobility comments: min A for L LE    Transfers Overall transfer level: Needs assistance Equipment used: Rolling walker (2 wheels) Transfers: Sit to/from Stand Sit to Stand: Min assist           General transfer comment: Light  min A to stabilize; cues for hand placement    Ambulation/Gait Ambulation/Gait assistance: Min assist Gait Distance (Feet): 15 Feet Assistive device: Rolling walker (2 wheels) Gait Pattern/deviations: Step-to pattern, Decreased stride length, Decreased weight shift to left Gait velocity: decreased     General Gait Details: Cues for sequencing and light min A to stabilize first few steps  Stairs            Wheelchair Mobility     Tilt Bed    Modified Rankin (Stroke Patients Only)       Balance Overall balance assessment: Needs assistance Sitting-balance support: No upper extremity supported Sitting balance-Leahy Scale: Good     Standing balance support: Bilateral upper extremity supported, Reliant on assistive device for balance Standing balance-Leahy Scale: Poor                               Pertinent Vitals/Pain Pain Assessment Pain Assessment: 0-10 Pain Score: 5  Pain Location: L knee and buttock Pain Descriptors / Indicators: Discomfort Pain Intervention(s): Monitored during session, Limited activity within patient's tolerance, Premedicated before session, Repositioned, Ice applied    Home Living Family/patient expects to be discharged to:: Private residence Living Arrangements: Alone Available Help at Discharge: Family;Available 24 hours/day (dtr staying several days) Type of Home: Apartment ([facility]F at [facility]) Home Access: Level entry;Elevator       Home Layout: One level Home Equipment: Grab bars - tub/shower;Shower seat - built  in;Grab bars - toilet;Cane - single point      Prior Function Prior Level of Function : Independent/Modified Independent;Driving             Mobility Comments: Could ambulate in community - used cane for long distance or if in a crowd for safety       Extremity/Trunk Assessment   Upper Extremity Assessment Upper Extremity Assessment: Overall WFL for tasks assessed    Lower Extremity  Assessment Lower Extremity Assessment: LLE deficits/detail;RLE deficits/detail RLE Deficits / Details: ROM WFL; MMT 5/5 LLE Deficits / Details: Expected post op changes; ROM knee ~5 to 70 degrees; MMT ankle 5/5, knee 3/5, hip 3/5    Cervical / Trunk Assessment Cervical / Trunk Assessment: Normal  Communication        Cognition Arousal: Alert Behavior During Therapy: WFL for tasks assessed/performed   PT - Cognitive impairments: No apparent impairments                                 Cueing       General Comments General comments (skin integrity, edema, etc.): VSS    Exercises     Assessment/Plan    PT Assessment Patient needs continued PT services  PT Problem List Decreased strength;Decreased mobility;Decreased range of motion;Decreased activity tolerance;Decreased balance;Decreased knowledge of use of DME;Decreased safety awareness       PT Treatment Interventions DME instruction;Therapeutic exercise;Gait training;Stair training;Functional mobility training;Therapeutic activities;Patient/family education;Balance training;Modalities    PT Goals (Current goals can be found in the Care Plan section)  Acute Rehab PT Goals Patient Stated Goal: return home PT Goal Formulation: With patient/family Time For Goal Achievement: 09/13/24 Potential to Achieve Goals: Good    Frequency 7X/week     Co-evaluation               AM-PAC PT 6 Clicks Mobility  Outcome Measure Help needed turning from your back to your side while in a flat bed without using bedrails?: A Little Help needed moving from lying on your back to sitting on the side of a flat bed without using bedrails?: A Little Help needed moving to and from a bed to a chair (including a wheelchair)?: A Little Help needed standing up from a chair using your arms (e.g., wheelchair or bedside chair)?: A Little Help needed to walk in hospital room?: A Little Help needed climbing 3-5 steps with a railing?  : A Little 6 Click Score: 18    End of Session Equipment Utilized During Treatment: Gait belt Activity Tolerance: Patient tolerated treatment well Patient left: with chair alarm set;in chair;with call bell/phone within reach;with SCD's reapplied;with family/visitor present Nurse Communication: Mobility status PT Visit Diagnosis: Other abnormalities of gait and mobility (R26.89);Muscle weakness (generalized) (M62.81)    Time: 8382-8360 PT Time Calculation (min) (ACUTE ONLY): 22 min   Charges:   PT Evaluation $PT Eval Low Complexity: 1 Low   PT General Charges $$ ACUTE PT VISIT: 1 Visit         Benjiman, PT Acute Rehab [facility] [phone]   Benjiman VEAR Mulberry 08/30/2024, 5:01 PM

## 2024-08-30 NOTE — Anesthesia Procedure Notes (Signed)
 Anesthesia Regional Block: Adductor canal block   Pre-Anesthetic Checklist: , timeout performed,  Correct Patient, Correct Site, Correct Laterality,  Correct Procedure, Correct Position, site marked,  Risks and benefits discussed,  Pre-op evaluation,  At surgeon's request and post-op pain management  Laterality: Left  Prep: Maximum Sterile Barrier Precautions used, chloraprep       Needles:  Injection technique: Single-shot  Needle Type: Echogenic Stimulator Needle     Needle Length: 9cm  Needle Gauge: 21     Additional Needles:   Procedures:,,,, ultrasound used (permanent image in chart),,    Narrative:  Start time: 08/30/2024 8:15 AM End time: 08/30/2024 8:25 AM Injection made incrementally with aspirations every 5 mL.  Performed by: Personally  Anesthesiologist: Epifanio Fallow, MD

## 2024-08-30 NOTE — Plan of Care (Signed)

## 2024-08-30 NOTE — Anesthesia Procedure Notes (Signed)
 Spinal  Patient location during procedure: OR End time: 08/30/2024 9:08 AM Reason for block: surgical anesthesia Staffing Performed: resident/CRNA  Resident/CRNA: Dwana Winton BIRCH, CRNA Performed by: Katalyna Socarras, Clinical cytogeneticist D, CRNA Authorized by: Epifanio Fallow, MD   Preanesthetic Checklist Completed: patient identified, IV checked, site marked, risks and benefits discussed, surgical consent, monitors and equipment checked, pre-op evaluation and timeout performed Spinal Block Patient position: sitting Prep: DuraPrep Patient monitoring: heart rate, continuous pulse ox and blood pressure Approach: midline Location: L3-4 Injection technique: single-shot Needle Needle type: Pencan  Needle gauge: 24 G Needle length: 9 cm Assessment Sensory level: T6 Events: CSF return

## 2024-08-30 NOTE — Progress Notes (Signed)
 Orthopedic Tech Progress Note Patient Details:  Amanda [name] 1946/10/18 [id number] Applied CPM per order. Will remove at 2:50 pm.  CPM Left Knee Left Knee Flexion (Degrees): 40 Left Knee Extension (Degrees): 10  Post Interventions Patient Tolerated: Well Instructions Provided: Adjustment of device, Care of device, Poper ambulation with device Ortho Devices Type of Ortho Device: CPM padding Ortho Device/Splint Location: LLE Ortho Device/Splint Interventions: Ordered, Application, Adjustment   Post Interventions Patient Tolerated: Well Instructions Provided: Adjustment of device, Care of device, Poper ambulation with device  Morna Pink 08/30/2024, 10:52 AM

## 2024-08-30 NOTE — Interval H&P Note (Signed)
 History and Physical Interval Note:  08/30/2024 7:19 AM  Amanda [name]  has presented today for surgery, with the diagnosis of Left knee osteoarthritis.  The various methods of treatment have been discussed with the patient and family. After consideration of risks, benefits and other options for treatment, the patient has consented to  Procedure(s): ARTHROPLASTY, KNEE, TOTAL (Left) as a surgical intervention.  The patient's history has been reviewed, patient examined, no change in status, stable for surgery.  I have reviewed the patient's chart and labs.  Questions were answered to the patient's satisfaction.     Dempsey [name]

## 2024-08-30 NOTE — Op Note (Signed)
 OPERATIVE REPORT-TOTAL KNEE ARTHROPLASTY   Pre-operative diagnosis- Osteoarthritis  Left knee(s)  Post-operative diagnosis- Osteoarthritis Left knee(s)  Procedure-  Left  Total Knee Arthroplasty  Surgeon- Dempsey GAILS. Ilaisaane Marts, MD  Assistant- Roxie Mess, PA-C   Anesthesia-  Adductor canal block and spinal  EBL-25 ml   Drains None  Tourniquet time-  Total Tourniquet Time Documented: Thigh (Left) - 29 minutes Total: Thigh (Left) - 29 minutes     Complications- None  Condition-PACU - hemodynamically stable.   Brief Clinical Note  Amanda [name] is a 78 y.o. year old female with end stage OA of her left knee with progressively worsening pain and dysfunction. She has constant pain, with activity and at rest and significant functional deficits with difficulties even with ADLs. She has had extensive non-op management including analgesics, injections of cortisone and viscosupplements, and home exercise program, but remains in significant pain with significant dysfunction. Radiographs show bone on bone arthritis lateral and patellofemoral with valgus deformity. She presents now for left Total Knee Arthroplasty.     Procedure in detail---   The patient is brought into the operating room and positioned supine on the operating table. After successful administration of  Adductor canal block and spinal,   a tourniquet is placed high on the  Left thigh(s) and the lower extremity is prepped and draped in the usual sterile fashion. Time out is performed by the operating team and then the  Left lower extremity is wrapped in Esmarch, knee flexed and the tourniquet inflated to 300 mmHg.       A midline incision is made with a ten blade through the subcutaneous tissue to the level of the extensor mechanism. A fresh blade is used to make a medial parapatellar arthrotomy. Soft tissue over the proximal medial tibia is subperiosteally elevated to the joint line with a knife and into the  semimembranosus bursa with a Cobb elevator. Soft tissue over the proximal lateral tibia is elevated with attention being paid to avoiding the patellar tendon on the tibial tubercle. The patella is everted, knee flexed 90 degrees and the ACL and PCL are removed. Findings are bone on bone lateral and patellofemoral with large global osteophytes        The drill is used to create a starting hole in the distal femur and the canal is thoroughly irrigated with sterile saline to remove the fatty contents. The 5 degree Left  valgus alignment guide is placed into the femoral canal and the distal femoral cutting block is pinned to remove 10 mm off the distal femur. Resection is made with an oscillating saw.      The tibia is subluxed forward and the menisci are removed. The extramedullary alignment guide is placed referencing proximally at the medial aspect of the tibial tubercle and distally along the second metatarsal axis and tibial crest. The block is pinned to remove 2mm off the more deficient lateral  side. Resection is made with an oscillating saw. Size 5is the most appropriate size for the tibia and the proximal tibia is prepared with the modular drill and keel punch for that size.      The femoral sizing guide is placed and size 5 is most appropriate. Rotation is marked off the epicondylar axis and confirmed by creating a rectangular flexion gap at 90 degrees. The size 5 cutting block is pinned in this rotation and the anterior, posterior and chamfer cuts are made with the oscillating saw. The intercondylar block is then placed and that  cut is made.      Trial size 5 tibial component, trial size 5 posterior stabilized femur and a 8  mm posterior stabilized rotating platform insert trial is placed. Full extension is achieved with excellent varus/valgus and anterior/posterior balance throughout full range of motion. The patella is everted and thickness measured to be 22  mm. Free hand resection is taken to 12 mm, a  35 template is placed, lug holes are drilled, trial patella is placed, and it tracks normally. Osteophytes are removed off the posterior femur with the trial in place. All trials are removed and the cut bone surfaces prepared with pulsatile lavage. Cement is mixed and once ready for implantation, the size 5 tibial implant, size  5 posterior stabilized femoral component, and the size 35 patella are cemented in place and the patella is held with the clamp. The trial insert is placed and the knee held in full extension. The Exparel (20 ml mixed with 60 ml saline) is injected into the extensor mechanism, posterior capsule, medial and lateral gutters and subcutaneous tissues.  All extruded cement is removed and once the cement is hard the permanent 8 mm posterior stabilized rotating platform insert is placed into the tibial tray.      The wound is copiously irrigated with saline solution and the extensor mechanism closed with # 0 Stratofix suture. The tourniquet is released for a total tourniquet time of 29  minutes. Flexion against gravity is 140 degrees and the patella tracks normally. Subcutaneous tissue is closed with 2.0 vicryl and subcuticular with running 4.0 Monocryl. The incision is cleaned and dried and steri-strips and a bulky sterile dressing are applied. The limb is placed into a knee immobilizer and the patient is awakened and transported to recovery in stable condition.      Please note that a surgical assistant was a medical necessity for this procedure in order to perform it in a safe and expeditious manner. Surgical assistant was necessary to retract the ligaments and vital neurovascular structures to prevent injury to them and also necessary for proper positioning of the limb to allow for anatomic placement of the prosthesis.   Dempsey ROCKFORD Josealfredo Adkins, MD    08/30/2024, 10:13 AM

## 2024-08-30 NOTE — Care Plan (Signed)
 Ortho Bundle Case Management Note  Patient Details  Name: Amanda [name] MRN: [id number] Date of Birth: 1946/06/06  LT TKA on 08/30/24  DCP: Home with daughter  DME: RW; ordered through [employer]  PT: [facility]                  DME Arranged:  Vannie rolling DME Agency:  [employer]  HH Arranged:    HH Agency:     Additional Comments: Please contact me with any questions of if this plan should need to change.  Burnard Dross, Case Manager [facility] [phone]  Ext. [phone]   08/30/2024, 1:38 PM

## 2024-08-30 NOTE — Progress Notes (Signed)
 Orthopedic Tech Progress Note Patient Details:  Amanda [name] Nov 27, 1945 [id number]  Patient ID: Amanda [name], female   DOB: Sep 02, 1946, 78 y.o.   MRN: [id number] Removed pt from CPM.  Morna Pink 08/30/2024, 2:52 PM

## 2024-08-30 NOTE — Discharge Instructions (Addendum)
 Frank Aluisio, MD Total Joint Specialist [facility] [address] [place], [place] [address] [phone]  TOTAL KNEE REPLACEMENT POSTOPERATIVE DIRECTIONS    Knee Rehabilitation, Guidelines Following Surgery  Results after knee surgery are often greatly improved when you follow the exercise, range of motion and muscle strengthening exercises prescribed by your doctor. Safety measures are also important to protect the knee from further injury. If any of these exercises cause you to have increased pain or swelling in your knee joint, decrease the amount until you are comfortable again and slowly increase them. If you have problems or questions, call your caregiver or physical therapist for advice.    BLOOD CLOT PREVENTION Take a 10 mg Xarelto once a day for three weeks following surgery. Then take an 81 mg Aspirin once a day for three weeks. Then discontinue Aspirin. You may resume your vitamins/supplements once you have discontinued the Xarelto. Do not take any NSAIDs (Advil, Aleve, Ibuprofen, Meloxicam, etc.) until you have discontinued the Xarelto.    HOME CARE INSTRUCTIONS  Remove items at home which could result in a fall. This includes throw rugs or furniture in walking pathways.  ICE to the affected knee as much as tolerated. Icing helps control swelling. If the swelling is well controlled you will be more comfortable and rehab easier. Continue to use ice on the knee for pain and swelling from surgery. You may notice swelling that will progress down to the foot and ankle. This is normal after surgery. Elevate the leg when you are not up walking on it.    Continue to use the breathing machine which will help keep your temperature down. It is common for your temperature to cycle up and down following surgery, especially at night when you are not up moving around and exerting yourself. The breathing machine keeps your lungs expanded and your temperature  down. Do not place pillow under the operative knee, focus on keeping the knee straight while resting  DIET You may resume your previous home diet once you are discharged from the hospital.  DRESSING / WOUND CARE / SHOWERING Keep your bulky bandage on for 2 days. On the third post-operative day you may remove the Ace bandage and gauze. There is a waterproof adhesive bandage on your skin which will stay in place until your first follow-up appointment. Once you remove this you will not need to place another bandage You may begin showering 3 days following surgery, but do not submerge the incision under water.  ACTIVITY For the first 5 days, the key is rest and control of pain and swelling Do your home exercises twice a day starting on post-operative day 3. On the days you go to physical therapy, just do the home exercises once that day. You should rest, ice and elevate the leg for 50 minutes out of every hour. Get up and walk/stretch for 10 minutes per hour. After 5 days you can increase your activity slowly as tolerated. Walk with your walker as instructed. Use the walker until you are comfortable transitioning to a cane. Walk with the cane in the opposite hand of the operative leg. You may discontinue the cane once you are comfortable and walking steadily. Avoid periods of inactivity such as sitting longer than an hour when not asleep. This helps prevent blood clots.  You may discontinue the knee immobilizer once you are able to perform a straight leg raise while lying down. You may resume a sexual relationship in one   month or when given the OK by your doctor.  You may return to work once you are cleared by your doctor.  Do not drive a car for 6 weeks or until released by your surgeon.  Do not drive while taking narcotics.  TED HOSE STOCKINGS Wear the elastic stockings on both legs for three weeks following surgery during the day. You may remove them at night for sleeping.  WEIGHT  BEARING Weight bearing as tolerated with assist device (walker, cane, etc) as directed, use it as long as suggested by your surgeon or therapist, typically at least 4-6 weeks.  POSTOPERATIVE CONSTIPATION PROTOCOL Constipation - defined medically as fewer than three stools per week and severe constipation as less than one stool per week.  One of the most common issues patients have following surgery is constipation.  Even if you have a regular bowel pattern at home, your normal regimen is likely to be disrupted due to multiple reasons following surgery.  Combination of anesthesia, postoperative narcotics, change in appetite and fluid intake all can affect your bowels.  In order to avoid complications following surgery, here are some recommendations in order to help you during your recovery period.  Colace (docusate) - Pick up an over-the-counter form of Colace or another stool softener and take twice a day as long as you are requiring postoperative pain medications.  Take with a full glass of water daily.  If you experience loose stools or diarrhea, hold the colace until you stool forms back up. If your symptoms do not get better within 1 week or if they get worse, check with your doctor. Dulcolax (bisacodyl) - Pick up over-the-counter and take as directed by the product packaging as needed to assist with the movement of your bowels.  Take with a full glass of water.  Use this product as needed if not relieved by Colace only.  MiraLax (polyethylene glycol) - Pick up over-the-counter to have on hand. MiraLax is a solution that will increase the amount of water in your bowels to assist with bowel movements.  Take as directed and can mix with a glass of water, juice, soda, coffee, or tea. Take if you go more than two days without a movement. Do not use MiraLax more than once per day. Call your doctor if you are still constipated or irregular after using this medication for 7 days in a row.  If you continue  to have problems with postoperative constipation, please contact the office for further assistance and recommendations.  If you experience "the worst abdominal pain ever" or develop nausea or vomiting, please contact the office immediatly for further recommendations for treatment.  ITCHING If you experience itching with your medications, try taking only a single pain pill, or even half a pain pill at a time.  You can also use Benadryl over the counter for itching or also to help with sleep.   MEDICATIONS See your medication summary on the "After Visit Summary" that the nursing staff will review with you prior to discharge.  You may have some home medications which will be placed on hold until you complete the course of blood thinner medication.  It is important for you to complete the blood thinner medication as prescribed by your surgeon.  Continue your approved medications as instructed at time of discharge.  PRECAUTIONS If you experience chest pain or shortness of breath - call 911 immediately for transfer to the hospital emergency department.  If you develop a fever greater that 101   F, purulent drainage from wound, increased redness or drainage from wound, foul odor from the wound/dressing, or calf pain - CONTACT YOUR SURGEON.                                                   FOLLOW-UP APPOINTMENTS Make sure you keep all of your appointments after your operation with your surgeon and caregivers. You should call the office at the above phone number and make an appointment for approximately two weeks after the date of your surgery or on the date instructed by your surgeon outlined in the "After Visit Summary".  RANGE OF MOTION AND STRENGTHENING EXERCISES  Rehabilitation of the knee is important following a knee injury or an operation. After just a few days of immobilization, the muscles of the thigh which control the knee become weakened and shrink (atrophy). Knee exercises are designed to build up  the tone and strength of the thigh muscles and to improve knee motion. Often times heat used for twenty to thirty minutes before working out will loosen up your tissues and help with improving the range of motion but do not use heat for the first two weeks following surgery. These exercises can be done on a training (exercise) mat, on the floor, on a table or on a bed. Use what ever works the best and is most comfortable for you Knee exercises include:  Leg Lifts - While your knee is still immobilized in a splint or cast, you can do straight leg raises. Lift the leg to 60 degrees, hold for 3 sec, and slowly lower the leg. Repeat 10-20 times 2-3 times daily. Perform this exercise against resistance later as your knee gets better.  Quad and Hamstring Sets - Tighten up the muscle on the front of the thigh (Quad) and hold for 5-10 sec. Repeat this 10-20 times hourly. Hamstring sets are done by pushing the foot backward against an object and holding for 5-10 sec. Repeat as with quad sets.  Leg Slides: Lying on your back, slowly slide your foot toward your buttocks, bending your knee up off the floor (only go as far as is comfortable). Then slowly slide your foot back down until your leg is flat on the floor again. Angel Wings: Lying on your back spread your legs to the side as far apart as you can without causing discomfort.  A rehabilitation program following serious knee injuries can speed recovery and prevent re-injury in the future due to weakened muscles. Contact your doctor or a physical therapist for more information on knee rehabilitation.   POST-OPERATIVE OPIOID TAPER INSTRUCTIONS: It is important to wean off of your opioid medication as soon as possible. If you do not need pain medication after your surgery it is ok to stop day one. Opioids include: Codeine, Hydrocodone(Norco, Vicodin), Oxycodone(Percocet, oxycontin) and hydromorphone amongst others.  Long term and even short term use of opiods can  cause: Increased pain response Dependence Constipation Depression Respiratory depression And more.  Withdrawal symptoms can include Flu like symptoms Nausea, vomiting And more Techniques to manage these symptoms Hydrate well Eat regular healthy meals Stay active Use relaxation techniques(deep breathing, meditating, yoga) Do Not substitute Alcohol to help with tapering If you have been on opioids for less than two weeks and do not have pain than it is ok to stop all together.    Plan to wean off of opioids This plan should start within one week post op of your joint replacement. Maintain the same interval or time between taking each dose and first decrease the dose.  Cut the total daily intake of opioids by one tablet each day Next start to increase the time between doses. The last dose that should be eliminated is the evening dose.   IF YOU ARE TRANSFERRED TO A SKILLED REHAB FACILITY If the patient is transferred to a skilled rehab facility following release from the hospital, a list of the current medications will be sent to the facility for the patient to continue.  When discharged from the skilled rehab facility, please have the facility set up the patient's Home Health Physical Therapy prior to being released. Also, the skilled facility will be responsible for providing the patient with their medications at time of release from the facility to include their pain medication, the muscle relaxants, and their blood thinner medication. If the patient is still at the rehab facility at time of the two week follow up appointment, the skilled rehab facility will also need to assist the patient in arranging follow up appointment in our office and any transportation needs.  MAKE SURE YOU:  Understand these instructions.  Get help right away if you are not doing well or get worse.   DENTAL ANTIBIOTICS:  In most cases prophylactic antibiotics for Dental procdeures after total joint surgery are  not necessary.  Exceptions are as follows:  1. History of prior total joint infection  2. Severely immunocompromised (Organ Transplant, cancer chemotherapy, Rheumatoid biologic meds such as Humera)  3. Poorly controlled diabetes (A1C &gt; 8.0, blood glucose over 200)  If you have one of these conditions, contact your surgeon for an antibiotic prescription, prior to your dental procedure.    Pick up stool softner and laxative for home use following surgery while on pain medications. Do not submerge incision under water. Please use good hand washing techniques while changing dressing each day. May shower starting three days after surgery. Please use a clean towel to pat the incision dry following showers. Continue to use ice for pain and swelling after surgery. Do not use any lotions or creams on the incision until instructed by your surgeon.      Information on my medicine - XARELTO (Rivaroxaban)  This medication education was reviewed with me or my healthcare representative as part of my discharge preparation.    Why was Xarelto prescribed for you? Xarelto was prescribed for you to reduce the risk of blood clots forming after orthopedic surgery. The medical term for these abnormal blood clots is venous thromboembolism (VTE).  What do you need to know about xarelto ? Take your Xarelto ONCE DAILY at the same time every day. You may take it either with or without food.  If you have difficulty swallowing the tablet whole, you may crush it and mix in applesauce just prior to taking your dose.  Take Xarelto exactly as prescribed by your doctor and DO NOT stop taking Xarelto without talking to the doctor who prescribed the medication.  Stopping without other VTE prevention medication to take the place of Xarelto may increase your risk of developing a clot.  After discharge, you should have regular check-up appointments with your healthcare provider that is prescribing your  Xarelto.    What do you do if you miss a dose? If you miss a dose, take it as soon as you remember on the same   day then continue your regularly scheduled once daily regimen the next day. Do not take two doses of Xarelto on the same day.   Important Safety Information A possible side effect of Xarelto is bleeding. You should call your healthcare provider right away if you experience any of the following: Bleeding from an injury or your nose that does not stop. Unusual colored urine (red or dark brown) or unusual colored stools (red or black). Unusual bruising for unknown reasons. A serious fall or if you hit your head (even if there is no bleeding).  Some medicines may interact with Xarelto and might increase your risk of bleeding while on Xarelto. To help avoid this, consult your healthcare provider or pharmacist prior to using any new prescription or non-prescription medications, including herbals, vitamins, non-steroidal anti-inflammatory drugs (NSAIDs) and supplements.  This website has more information on Xarelto: www.xarelto.com.   

## 2024-08-31 ENCOUNTER — Other Ambulatory Visit (HOSPITAL_COMMUNITY): Payer: Self-pay

## 2024-08-31 ENCOUNTER — Encounter (HOSPITAL_COMMUNITY): Payer: Self-pay | Admitting: Orthopedic Surgery

## 2024-08-31 ENCOUNTER — Other Ambulatory Visit: Payer: Self-pay

## 2024-08-31 DIAGNOSIS — M1712 Unilateral primary osteoarthritis, left knee: Secondary | ICD-10-CM | POA: Diagnosis not present

## 2024-08-31 DIAGNOSIS — Z85828 Personal history of other malignant neoplasm of skin: Secondary | ICD-10-CM | POA: Diagnosis not present

## 2024-08-31 DIAGNOSIS — Z8571 Personal history of Hodgkin lymphoma: Secondary | ICD-10-CM | POA: Diagnosis not present

## 2024-08-31 DIAGNOSIS — Z79899 Other long term (current) drug therapy: Secondary | ICD-10-CM | POA: Diagnosis not present

## 2024-08-31 DIAGNOSIS — Z7901 Long term (current) use of anticoagulants: Secondary | ICD-10-CM | POA: Diagnosis not present

## 2024-08-31 DIAGNOSIS — I10 Essential (primary) hypertension: Secondary | ICD-10-CM | POA: Diagnosis not present

## 2024-08-31 LAB — BASIC METABOLIC PANEL WITH GFR
Anion gap: 10 (ref 5–15)
BUN: 11 mg/dL (ref 8–23)
CO2: 25 mmol/L (ref 22–32)
Calcium: 8.8 mg/dL — ABNORMAL LOW (ref 8.9–10.3)
Chloride: 107 mmol/L (ref 98–111)
Creatinine, Ser: 0.72 mg/dL (ref 0.44–1.00)
GFR, Estimated: >60 mL/min (ref >60)
Glucose, Bld: 132 mg/dL — ABNORMAL HIGH (ref 70–99)
Potassium: 3.7 mmol/L (ref 3.5–5.1)
Sodium: 142 mmol/L (ref 135–145)

## 2024-08-31 LAB — CBC
HCT: 34.2 % — ABNORMAL LOW (ref 36.0–46.0)
Hemoglobin: 11.1 g/dL — ABNORMAL LOW (ref 12.0–15.0)
MCH: 27.9 pg (ref 26.0–34.0)
MCHC: 32.5 g/dL (ref 30.0–36.0)
MCV: 85.9 fL (ref 80.0–100.0)
Platelets: 160 K/uL (ref 150–400)
RBC: 3.98 MIL/uL (ref 3.87–5.11)
RDW: 13.2 % (ref 11.5–15.5)
WBC: 15.2 K/uL — ABNORMAL HIGH (ref 4.0–10.5)
nRBC: 0 % (ref 0.0–0.2)

## 2024-08-31 MED ORDER — METHOCARBAMOL 500 MG PO TABS
500.0000 mg | ORAL_TABLET | Freq: Four times a day (QID) | ORAL | 0 refills | Status: DC | PRN
  Filled 2024-08-31 (×2): qty 40, 10d supply, fill #0

## 2024-08-31 MED ORDER — HYDROMORPHONE HCL 2 MG PO TABS
2.0000 mg | ORAL_TABLET | Freq: Four times a day (QID) | ORAL | 0 refills | Status: DC | PRN
  Filled 2024-08-31 (×2): qty 42, 6d supply, fill #0

## 2024-08-31 MED ORDER — RIVAROXABAN 10 MG PO TABS
10.0000 mg | ORAL_TABLET | Freq: Every day | ORAL | 0 refills | Status: DC
  Filled 2024-08-31 (×2): qty 20, 20d supply, fill #0

## 2024-08-31 MED ORDER — ONDANSETRON HCL 4 MG PO TABS
4.0000 mg | ORAL_TABLET | Freq: Four times a day (QID) | ORAL | 0 refills | Status: AC | PRN
  Filled 2024-08-31 (×2): qty 20, 5d supply, fill #0

## 2024-08-31 NOTE — Progress Notes (Signed)
 Physical Therapy Treatment Patient Details Name: Amanda [name] MRN: [id number] DOB: 04-26-1946 Today's Date: 08/31/2024   History of Present Illness Pt is 78 yo female s/p L TKA on 08/30/24.  Pt with hx including but not limited to OA, CLL, afib, THA 2010, and macular degeneration    PT Comments  Pt is POD # 1 and is progressing well.  Pt with good quad activation, ROM, and pain control. She has DME, support at home,  and getting HH therapy at [facility].  Pt able to ambulate 54' with RW safely.  Pt demonstrates safe gait & transfers in order to return home from PT perspective once discharged by MD.  While in hospital, will continue to benefit from PT for skilled therapy to advance mobility and exercises.       If plan is discharge home, recommend the following: A little help with walking and/or transfers;A little help with bathing/dressing/bathroom;Assistance with cooking/housework;Help with stairs or ramp for entrance   Can travel by private vehicle        Equipment Recommendations  Rolling walker (2 wheels)    Recommendations for Other Services       Precautions / Restrictions Precautions Precautions: Fall;Knee Restrictions LLE Weight Bearing Per Provider Order: Weight bearing as tolerated     Mobility  Bed Mobility Overal bed mobility: Needs Assistance Bed Mobility: Supine to Sit     Supine to sit: Supervision, HOB elevated     General bed mobility comments: self assisted L LE with belt    Transfers Overall transfer level: Needs assistance Equipment used: Rolling walker (2 wheels) Transfers: Sit to/from Stand Sit to Stand: Contact guard assist           General transfer comment: min cues for hand placement; stood from bed , chair, and toliet    Ambulation/Gait Ambulation/Gait assistance: Contact guard assist Gait Distance (Feet): 60 Feet Assistive device: Rolling walker (2 wheels) Gait Pattern/deviations: Step-to pattern, Decreased stride length, Decreased  weight shift to left, Antalgic Gait velocity: decreased     General Gait Details: initial cues for RW proximity, personal RW adjusted to correct height   Stairs             Wheelchair Mobility     Tilt Bed    Modified Rankin (Stroke Patients Only)       Balance Overall balance assessment: Needs assistance Sitting-balance support: No upper extremity supported Sitting balance-Leahy Scale: Good     Standing balance support: Bilateral upper extremity supported, No upper extremity supported Standing balance-Leahy Scale: Fair Standing balance comment: RW to ambulate but could stand without UE support                            Communication    Cognition Arousal: Alert Behavior During Therapy: WFL for tasks assessed/performed   PT - Cognitive impairments: No apparent impairments                                Cueing    Exercises Total Joint Exercises Ankle Circles/Pumps: AROM, Both, 10 reps, Supine Quad Sets: AROM, Both, 10 reps, Supine Heel Slides: AAROM, Left, Supine, 5 reps Hip ABduction/ADduction: AAROM, Left, Supine, 5 reps Long Arc Quad: AROM, Left, 5 reps, Seated Knee Flexion: AROM, Left, Seated, 10 reps Goniometric ROM: L knee 5 to 75 degrees    General Comments   Educated on safe ice use,  no pivots, car transfers, resting with leg straight, and TED hose during day. Also, encouraged walking every 1-2 hours during day. Educated on HEP with focus on mobility the first weeks. Discussed doing exercises within pain control and if pain increasing could decreased ROM, reps, and stop exercises as needed. Encouraged to perform quad sets and ankle pumps frequently for blood flow and to promote full knee extension.      Pertinent Vitals/Pain Pain Assessment Pain Assessment: 0-10 Pain Score: 3  Pain Location: L knee and buttock Pain Descriptors / Indicators: Discomfort Pain Intervention(s): Limited activity within patient's tolerance,  Monitored during session, Premedicated before session, Repositioned, Ice applied    Home Living                          Prior Function            PT Goals (current goals can now be found in the care plan section) Progress towards PT goals: Progressing toward goals    Frequency    7X/week      PT Plan      Co-evaluation              AM-PAC PT 6 Clicks Mobility   Outcome Measure  Help needed turning from your back to your side while in a flat bed without using bedrails?: A Little Help needed moving from lying on your back to sitting on the side of a flat bed without using bedrails?: A Little Help needed moving to and from a bed to a chair (including a wheelchair)?: A Little Help needed standing up from a chair using your arms (e.g., wheelchair or bedside chair)?: A Little Help needed to walk in hospital room?: A Little Help needed climbing 3-5 steps with a railing? : A Little 6 Click Score: 18    End of Session Equipment Utilized During Treatment: Gait belt Activity Tolerance: Patient tolerated treatment well Patient left: in chair;with call bell/phone within reach;with family/visitor present Nurse Communication: Mobility status PT Visit Diagnosis: Other abnormalities of gait and mobility (R26.89);Muscle weakness (generalized) (M62.81)     Time: 1510-1531 PT Time Calculation (min) (ACUTE ONLY): 21 min  Charges:    $Gait Training: 8-22 mins PT General Charges $$ ACUTE PT VISIT: 1 Visit                     Benjiman, PT Acute Rehab Services [facility] [phone]    Benjiman Amanda [name] 08/31/2024, 4:04 PM

## 2024-08-31 NOTE — Progress Notes (Signed)
 Subjective: 1 Day Post-Op Procedure(s) (LRB): ARTHROPLASTY, KNEE, TOTAL (Left) Patient reports pain as mild.   Patient seen in rounds by Dr. Melodi. Patient is well, and has had no acute complaints or problems No issues overnight. Denies chest pain, SOB, or calf pain. Foley catheter removed this AM.  We will continue therapy today, ambulated 15' yesterday.   Objective: Vital signs in last 24 hours: Temp:  [97.5 F (36.4 C)-98.2 F (36.8 C)] 98.2 F (36.8 C) (10/21 0525) Pulse Rate:  [44-65] 58 (10/21 0525) Resp:  [10-23] 14 (10/21 0525) BP: (103-134)/(47-63) 133/60 (10/21 0525) SpO2:  [96 %-100 %] 99 % (10/21 0525)  Intake/Output from previous day:  Intake/Output Summary (Last 24 hours) at 08/31/2024 0819 Last data filed at 08/31/2024 0640 Gross per 24 hour  Intake 4091.23 ml  Output 5450 ml  Net -1358.77 ml     Intake/Output this shift: No intake/output data recorded.  Labs: Recent Labs    08/31/24 0344  HGB 11.1*   Recent Labs    08/31/24 0344  WBC 15.2*  RBC 3.98  HCT 34.2*  PLT 160   Recent Labs    08/31/24 0344  NA 142  K 3.7  CL 107  CO2 25  BUN 11  CREATININE 0.72  GLUCOSE 132*  CALCIUM 8.8*   No results for input(s): LABPT, INR in the last 72 hours.  Exam: General - Patient is Alert and Oriented Extremity - Neurologically intact Neurovascular intact Sensation intact distally Dorsiflexion/Plantar flexion intact Dressing - dressing C/D/I Motor Function - intact, moving foot and toes well on exam.   Past Medical History:  Diagnosis Date   Adenopathy    Arthritis    Atrial fibrillation (HCC)    CLL (chronic lymphocytic leukemia) (HCC) 08/31/2013   Dysrhythmia    hx of atrial fib none in 10 years per pt   Heart murmur    HTN (hypertension)    taken off of meds 06/2024 due to blood pressure low per pt   Hyperlipidemia    Hypertensive retinopathy    Lymphocytosis    Macular degeneration    OU   PONV (postoperative nausea  and vomiting)     Assessment/Plan: 1 Day Post-Op Procedure(s) (LRB): ARTHROPLASTY, KNEE, TOTAL (Left) Principal Problem:   OA (osteoarthritis) of knee Active Problems:   Primary osteoarthritis of left knee  Estimated body mass index is 25.61 kg/m as calculated from the following:   Height as of this encounter: 5' 2 (1.575 m).   Weight as of this encounter: 63.5 kg. Advance diet Up with therapy D/C IV fluids   Patient's anticipated LOS is less than 2 midnights, meeting these requirements: - Lives within 1 hour of care - Has a competent adult at home to recover with post-op recover - NO history of  - Chronic pain requiring opioids  - Diabetes  - Coronary Artery Disease  - Heart failure  - Heart attack  - Stroke  - DVT/VTE  - Cardiac arrhythmia  - Respiratory Failure/COPD  - Renal failure  - Anemia  - Advanced Liver disease  DVT Prophylaxis - Xarelto Weight bearing as tolerated. Continue therapy.  Plan is to go Home after hospital stay. Patient is a resident at [facility]. She will be returning to her independent living apartment with her daughter. Plan for discharge later today if progresses with therapy and meeting goals. Scheduled for OPPT at [facility] through [facility]. Follow-up in the office in 2 weeks.  The PDMP database  was reviewed today prior to any opioid medications being prescribed to this patient.  Roxie Mess, PA-C Orthopedic Surgery [phone] 08/31/2024, 8:19 AM

## 2024-08-31 NOTE — Progress Notes (Addendum)
 [facility] - Clinic Note  09/08/2024    CHIEF COMPLAINT Patient presents for Retina Follow Up  HISTORY OF PRESENT ILLNESS: Amanda [name] is a 78 y.o. female who presents to the clinic today for:  HPI     Retina Follow Up   Patient presents with  Wet AMD.  In both eyes.  This started 7 weeks ago.  Duration of 7 weeks.  Since onset it is stable.  I, the attending physician,  performed the HPI with the patient and updated documentation appropriately.        Comments   7 week retina follow up ARMD OU and IVV OU pt is reporting no vision changes she denies any flashes has some floaters she had left knee replacement last Monday       Last edited by Valdemar Rogue, MD on 09/08/2024  5:36 PM.    Patient states she's been doing well, had knee replacement surgery last Monday, walking w/ a walker. Pain is not great but she's handling it well.   Referring physician: Clem Brands, PA-C [facility] [address] [place],  [place] [address]  HISTORICAL INFORMATION:  Selected notes from the MEDICAL RECORD NUMBER Referred by Clem Brands, PA on 11.16.21 for eval of dry to wet AMD conversion OS.   CURRENT MEDICATIONS: Current Outpatient Medications (Ophthalmic Drugs)  Medication Sig   hydroxypropyl methylcellulose / hypromellose (ISOPTO TEARS / GONIOVISC) 2.5 % ophthalmic solution Place 1 drop into both eyes as needed for dry eyes.   No current facility-administered medications for this visit. (Ophthalmic Drugs)   Current Outpatient Medications (Other)  Medication Sig   acalabrutinib  maleate (CALQUENCE ) 100 MG tablet Take 1 tablet (100 mg total) by mouth 2 (two) times daily.   acetaminophen  (TYLENOL ) 500 MG tablet Take 500-1,000 mg by mouth every 6 (six) hours as needed for moderate pain (pain score 4-6).   acyclovir  (ZOVIRAX ) 400 MG tablet Take 1 tablet (400 mg total) by mouth daily.   atorvastatin (LIPITOR) 40 MG tablet Take 40 mg by  mouth daily.   fluticasone (FLONASE) 50 MCG/ACT nasal spray Place 2 sprays into both nostrils daily.   hydrochlorothiazide (HYDRODIURIL) 25 MG tablet Take 25 mg by mouth daily.   HYDROmorphone (DILAUDID) 2 MG tablet Take 1-2 tablets (2-4 mg total) by mouth every 6 (six) hours as needed for severe pain (pain score 7-10).   HYDROmorphone (DILAUDID) 2 MG tablet Take 1-2 tablets (2-4 mg total) by mouth every 8 (eight) hours as needed for severe pain   methocarbamol (ROBAXIN) 500 MG tablet Take 1 tablet (500 mg total) by mouth every 6 (six) hours as needed for muscle spasms.   ondansetron  (ZOFRAN ) 4 MG tablet Take 1 tablet (4 mg total) by mouth every 6 (six) hours as needed for nausea.   potassium chloride SA (K-DUR,KLOR-CON) 20 MEQ tablet Take 20 mEq by mouth every Monday, Wednesday, and Friday.   rivaroxaban (XARELTO) 10 MG TABS tablet Take 1 tablet (10 mg total) by mouth daily with breakfast. Then take one 81 mg aspirin once a day for three weeks. Then discontinue aspirin.   No current facility-administered medications for this visit. (Other)   REVIEW OF SYSTEMS: ROS   Positive for: Cardiovascular, Eyes Negative for: Constitutional, Gastrointestinal, Neurological, Skin, Genitourinary, Musculoskeletal, HENT, Endocrine, Respiratory, Psychiatric, Allergic/Imm, Heme/Lymph Last edited by Resa Delon ORN, COT on 09/08/2024  1:36 PM.     ALLERGIES Allergies  Allergen Reactions   Codeine Nausea  And Vomiting   Demerol [Meperidine] Nausea And Vomiting   Morphine And Codeine Nausea And Vomiting   Ciprofloxacin Rash   Sulfa Antibiotics Rash   PAST MEDICAL HISTORY Past Medical History:  Diagnosis Date   Adenopathy    Arthritis    Atrial fibrillation (HCC)    CLL (chronic lymphocytic leukemia) (HCC) 08/31/2013   Dysrhythmia    hx of atrial fib none in 10 years per pt   Heart murmur    HTN (hypertension)    taken off of meds 06/2024 due to blood pressure low per pt   Hyperlipidemia     Hypertensive retinopathy    Lymphocytosis    Macular degeneration    OU   PONV (postoperative nausea and vomiting)    Past Surgical History:  Procedure Laterality Date   ABDOMINAL HYSTERECTOMY     endometriosis, fibroid   APPENDECTOMY     BREAST BIOPSY     benign cyst.   CATARACT EXTRACTION Bilateral 2019   CHOLECYSTECTOMY     COLONOSCOPY  11/2012   Per Dr. Donnald.  neg.    EYE SURGERY     IR FLUORO GUIDE PORT INSERTION RIGHT  08/12/2017   IR REMOVAL TUN ACCESS W/ PORT W/O FL MOD SED  01/29/2018   IR US  GUIDE VASC ACCESS RIGHT  08/12/2017   SQUAMOUS CELL CARCINOMA EXCISION  12/28/2020   Excised from top of head   THUMB ARTHROSCOPY     TOTAL HIP ARTHROPLASTY  2010   TOTAL KNEE ARTHROPLASTY Left 08/30/2024   Procedure: ARTHROPLASTY, KNEE, TOTAL;  Surgeon: Melodi Lerner, MD;  Location: [facility];  Service: Orthopedics;  Laterality: Left;   FAMILY HISTORY Family History  Problem Relation Age of Onset   Renal Disease Mother    Cancer Mother        colon cancer   Cancer Father        colon cancer   SOCIAL HISTORY Social History   Tobacco Use   Smoking status: Former    Types: Cigarettes   Smokeless tobacco: Never  Vaping Use   Vaping status: Never Used  Substance Use Topics   Alcohol use: Yes    Comment: occ glass of wine   Drug use: No       OPHTHALMIC EXAM: Base Eye Exam     Visual Acuity (Snellen - Linear)       Right Left   Dist cc 20/25 -2 20/20   Dist ph cc NI          Tonometry (Tonopen, 1:41 PM)       Right Left   Pressure 15 17         Pupils       Pupils Dark Light Shape React APD   Right PERRL 3 2 Round Brisk None   Left PERRL 3 2 Round Brisk None         Visual Fields       Left Right    Full Full         Extraocular Movement       Right Left    Full, Ortho Full, Ortho         Neuro/Psych     Oriented x3: Yes   Mood/Affect: Normal         Dilation     Both eyes: 2.5% Phenylephrine @ 1:41 PM            Slit Lamp and Fundus Exam     Slit Lamp Exam  Right Left   Lids/Lashes Dermatochalasis - upper lid, mild Meibomian gland dysfunction Dermatochalasis - upper lid, mild Meibomian gland dysfunction   Conjunctiva/Sclera White and quiet White and quiet   Cornea Trace Debris in tear film, well healed temporal cataract wounds, 1+ pigmented Guttata, trace PEE well healed temporal cataract wounds, 2+ Punctate epithelial erosions   Anterior Chamber Deep and quiet Deep and quiet   Iris Round and dilated Round and dilated   Lens PC IOL in good position, trace PCO PC IOL in good position   Anterior Vitreous Trace Vitreous syneresis, Posterior vitreous detachment Trace Vitreous syneresis, Posterior vitreous detachment         Fundus Exam       Right Left   Disc Compact, trace pallor, Sharp rim, mild PPA Compact, Pink and Sharp, temporal PPP   C/D Ratio 0.1 0.2   Macula Blunted foveal reflex, Drusen, RPE mottling, clumping and early atrophy, shallow PED with trace SRF Blunted foveal reflex, +CNV, Drusen, RPE mottling and clumping; trace shallow SRF--stably improved, no heme   Vessels attenuated, Tortuous attenuated, Tortuous   Periphery Attached; mild reticular degeneration, no heme Attached, reticular degeneration, No heme           Refraction     Wearing Rx       Sphere Cylinder Axis Add   Right -0.25 +0.75 165 +2.50   Left -0.50 +0.50 012 +2.50    Type: PAL           IMAGING AND PROCEDURES  Imaging and Procedures for 09/08/2024  OCT, Retina - OU - Both Eyes       Right Eye Quality was good. Central Foveal Thickness: 265. Progression has worsened. Findings include normal foveal contour, no IRF, retinal drusen , subretinal hyper-reflective material, intraretinal hyper-reflective material, pigment epithelial detachment, subretinal fluid, outer retinal atrophy (Shallow SRF centrally overlying stable low PED, no IRF).   Left Eye Quality was good. Central Foveal  Thickness: 310. Progression has been stable. Findings include no IRF, no SRF, abnormal foveal contour, retinal drusen , subretinal hyper-reflective material, choroidal neovascular membrane, pigment epithelial detachment, outer retinal atrophy (Stable improvement in shallow SRF overlying PED ).   Notes *Images captured and stored on drive  Diagnosis / Impression:  OD: exu ARMD -- Shallow SRF centrally overlying stable low PED, no IRF OS: exu ARMD -- Stable improvement in shallow SRF overlying PED   Clinical management:  See below  Abbreviations: NFP - Normal foveal profile. CME - cystoid macular edema. PED - pigment epithelial detachment. IRF - intraretinal fluid. SRF - subretinal fluid. EZ - ellipsoid zone. ERM - epiretinal membrane. ORA - outer retinal atrophy. ORT - outer retinal tubulation. SRHM - subretinal hyper-reflective material. IRHM - intraretinal hyper-reflective material      Intravitreal Injection, Pharmacologic Agent - OD - Right Eye       Time Out 09/08/2024. 2:38 PM. Confirmed correct patient, procedure, site, and patient consented.   Anesthesia Topical anesthesia was used. Anesthetic medications included Lidocaine  2%, Proparacaine 0.5%.   Procedure Preparation included 5% betadine to ocular surface, eyelid speculum. A supplied (32g) needle was used.   Injection: 6 mg faricimab -svoa 6 MG/0.05ML   Route: Intravitreal, Site: Right Eye   NDC: 49757-903-93, Lot: [id number], Expiration date: 08/10/2025, Waste: 0 mL   Post-op Post injection exam found visual acuity of at least counting fingers. The patient tolerated the procedure well. There were no complications. The patient received written and verbal post procedure care education. Post injection medications  were not given.      Intravitreal Injection, Pharmacologic Agent - OS - Left Eye       Time Out 09/08/2024. 2:38 PM. Confirmed correct patient, procedure, site, and patient consented.   Anesthesia Topical  anesthesia was used. Anesthetic medications included Lidocaine  2%, Proparacaine 0.5%.   Procedure Preparation included 5% betadine to ocular surface, eyelid speculum. A supplied (32g) needle was used.   Injection: 6 mg faricimab -svoa 6 MG/0.05ML   Route: Intravitreal, Site: Left Eye   NDC: 49757-903-93, Lot: [id number], Expiration date: 11/09/2025, Waste: 0 mL   Post-op Post injection exam found visual acuity of at least counting fingers. The patient tolerated the procedure well. There were no complications. The patient received written and verbal post procedure care education.             ASSESSMENT/PLAN:    ICD-10-CM   1. Exudative age-related macular degeneration of left eye with active choroidal neovascularization (HCC)  H35.3221 OCT, Retina - OU - Both Eyes    Intravitreal Injection, Pharmacologic Agent - OS - Left Eye    faricimab -svoa (VABYSMO ) 6mg /0.48mL intravitreal injection    2. Exudative age-related macular degeneration of right eye with active choroidal neovascularization (HCC)  H35.3211 OCT, Retina - OU - Both Eyes    Intravitreal Injection, Pharmacologic Agent - OD - Right Eye    faricimab -svoa (VABYSMO ) 6mg /0.34mL intravitreal injection    3. Essential hypertension  I10     4. Hypertensive retinopathy of both eyes  H35.033     5. Pseudophakia, both eyes  Z96.1       1. Exudative age related macular degeneration, OS  - [facility] pt with known history of nonexudative ARMD OU - acute haze over vision OS -- onset, Friday 11.12.21 -- initial exam with central subretinal heme - s/p IVA OS #1 (11.17.21), #2 (12.15.21), #3 (01.12.22), #4 (02.09.22), #5 (03.16.22), #6 (04.20.22), #7 (06.01.22), #8 (06.29.22) -- IVA resistance ========================= - s/p IVE OS #1 (07.27.22), #2 (08.24.22), #3 (09.21.22), #4 (10.19.22), #5 (11.16.22), #6 (12.16.22), #7 (01.18.23), #8 (02.15.23) -- IVE resistance ========================= - s/p IVV OS #1 SAMPLE (03.15.23),  #2 (04.12.23), #3 (05.10.23), #4 (06.07.23), #5 (07.19.23), #6 (08.22.23), #7 (09.27.23), #8 (12.20.23), #9 (01.31.24), #10 (03.13.24), #11 (04.24.24), #12 (05.31.24), #13 (07.09.24), #14 (08.21.24), #15 (10.02.24), #16 (11.13.24), #17 (12.18.24), #18 (01.22.25), #19 (02.26.25), #20 (04.02.25), #21 (05.07.25), #22 (06.18.25), #23 (07.30.25), #24 (09.10.25)  **h/o increased fluid at 6 wks on 11.13.24 and 07.30.25**  - BCVA OS 20/20 - stable - exam and OCT show OS: Stable improvement in shallow SRF overlying PED at 7 wks  - recommend IVV OS #25 today, 10.29.25 with follow up back to 6 weeks - pt wishes to be treated with IVV OS - RBA of procedure discussed, questions answered - IVV informed consent obtained and signed 01.22.25 (OS) - see procedure note   - f/u 6 weeks -- DFE/OCT, possible injections  2. Exudative age related macular degeneration OD  - conversion to exudative ARMD noted on 03.15.23 exam  - exam with new SRH temporal macula -- stably improved today - s/p IVE OD #1 (03.15.23), #2 (04.12.23), #3 (05.10.23), #4 (06.07.23), #5 (07.19.23), #6 (08.19.23), #7 (09.27.23), #8 (11.01.23), #9 (03.11.24), #10 (07.09.24), #11 (08.21.24), #12 (10.02.24), #13 (11.13.24), #14 (12.18.24) -- IVE resistance ============================ - s/p IVV OD #1 (01.22.25), #2 (02.26.25), #3 (04.02.25), #4 (05.07.25), #5 (06.18.25), #6 (07.30.25), #7 (09.10.25) ============================ **h/o increased SRF at 7 weeks on 10.29.25 IVV OD**  **h/o increased fluid at 6  wks on 11.13.24 IVE OD**  - BCVA OD decreased 20/25 from 20/20  - OCT OD: Shallow SRF centrally overlying stable low PED, no IRF at 7 wks - recommend IVV OD #8 today, 10.29.25 w/ f/u back to 6 wks  - pt in agreement  - RBA of procedure discussed, questions answered - see procedure note - IVV informed consent obtained and signed , 01.25.25 (OU)  - f/u 6 weeks -- DFE/OCT, possible injxns  3,4. Hypertensive retinopathy OU - discussed  importance of tight BP control - monitor     5. Pseudophakia OU  - s/p CE/IOL OU  - IOLs in good position, doing well  - monitor   Ophthalmic Meds Ordered this visit:  Meds ordered this encounter  Medications   faricimab -svoa (VABYSMO ) 6mg /0.38mL intravitreal injection   faricimab -svoa (VABYSMO ) 6mg /0.67mL intravitreal injection     Return in about 6 weeks (around 10/20/2024) for exu ARMD OU, DFE, OCT, Possible Injxn.  There are no Patient Instructions on file for this visit.  This document serves as a record of services personally performed by Redell JUDITHANN Hans, MD, PhD. It was created on their behalf by Almetta Pesa, an ophthalmic technician. The creation of this record is the provider's dictation and/or activities during the visit.    Electronically signed by: Almetta Pesa, OA, 09/12/24  3:00 PM  Redell JUDITHANN Hans, M.D., Ph.D. Diseases & Surgery of the Retina and Vitreous [facility]  I have reviewed the above documentation for accuracy and completeness, and I agree with the above. Redell JUDITHANN Hans, M.D., Ph.D. 09/12/24 3:00 PM    Abbreviations: M myopia (nearsighted); A astigmatism; H hyperopia (farsighted); P presbyopia; Mrx spectacle prescription;  CTL contact lenses; OD right eye; OS left eye; OU both eyes  XT exotropia; ET esotropia; PEK punctate epithelial keratitis; PEE punctate epithelial erosions; DES dry eye syndrome; MGD meibomian gland dysfunction; ATs artificial tears; PFAT's preservative free artificial tears; NSC nuclear sclerotic cataract; PSC posterior subcapsular cataract; ERM epi-retinal membrane; PVD posterior vitreous detachment; RD retinal detachment; DM diabetes mellitus; DR diabetic retinopathy; NPDR non-proliferative diabetic retinopathy; PDR proliferative diabetic retinopathy; CSME clinically significant macular edema; DME diabetic macular edema; dbh dot blot hemorrhages; CWS cotton wool spot; POAG primary open angle glaucoma; C/D  cup-to-disc ratio; HVF humphrey visual field; GVF goldmann visual field; OCT optical coherence tomography; IOP intraocular pressure; BRVO Branch retinal vein occlusion; CRVO central retinal vein occlusion; CRAO central retinal artery occlusion; BRAO branch retinal artery occlusion; RT retinal tear; SB scleral buckle; PPV pars plana vitrectomy; VH Vitreous hemorrhage; PRP panretinal laser photocoagulation; IVK intravitreal kenalog ; VMT vitreomacular traction; MH Macular hole;  NVD neovascularization of the disc; NVE neovascularization elsewhere; AREDS age related eye disease study; ARMD age related macular degeneration; POAG primary open angle glaucoma; EBMD epithelial/anterior basement membrane dystrophy; ACIOL anterior chamber intraocular lens; IOL intraocular lens; PCIOL posterior chamber intraocular lens; Phaco/IOL phacoemulsification with intraocular lens placement; PRK photorefractive keratectomy; LASIK laser assisted in situ keratomileusis; HTN hypertension; DM diabetes mellitus; COPD chronic obstructive pulmonary disease

## 2024-08-31 NOTE — Progress Notes (Signed)
 Physical Therapy Treatment Patient Details Name: RUVI FULLENWIDER MRN: [id number] DOB: Jan 15, 1946 Today's Date: 08/31/2024   History of Present Illness Pt is 78 yo female s/p L TKA on 08/30/24.  Pt with hx including but not limited to OA, CLL, afib, THA 2010, and macular degeneration    PT Comments  Pt making good progress today.  She has good ROM and quad activation.  Pt able to ambulate 53' with RW, CGA, and min cues.  Pt would like another session to progress and for daughter to be present.  Plan to f/u in afternoon but expect that pt will be able to return home with family today.     If plan is discharge home, recommend the following: A little help with walking and/or transfers;A little help with bathing/dressing/bathroom;Assistance with cooking/housework;Help with stairs or ramp for entrance   Can travel by private vehicle        Equipment Recommendations  Rolling walker (2 wheels)    Recommendations for Other Services       Precautions / Restrictions Precautions Precautions: Fall;Knee Restrictions LLE Weight Bearing Per Provider Order: Weight bearing as tolerated     Mobility  Bed Mobility Overal bed mobility: Needs Assistance Bed Mobility: Supine to Sit     Supine to sit: Supervision, HOB elevated     General bed mobility comments: self assisted L LE with belt    Transfers Overall transfer level: Needs assistance Equipment used: Rolling walker (2 wheels) Transfers: Sit to/from Stand Sit to Stand: Contact guard assist           General transfer comment: min cues for hand placement; stood from bed , chair, and toliet    Ambulation/Gait Ambulation/Gait assistance: Contact guard assist Gait Distance (Feet): 60 Feet Assistive device: Rolling walker (2 wheels) Gait Pattern/deviations: Step-to pattern, Decreased stride length, Decreased weight shift to left, Antalgic Gait velocity: decreased     General Gait Details: initial cues for RW proximity, personal  RW adjusted to correct height   Stairs             Wheelchair Mobility     Tilt Bed    Modified Rankin (Stroke Patients Only)       Balance Overall balance assessment: Needs assistance Sitting-balance support: No upper extremity supported Sitting balance-Leahy Scale: Good     Standing balance support: Bilateral upper extremity supported, No upper extremity supported Standing balance-Leahy Scale: Fair Standing balance comment: RW to ambulate but could stand without UE support                            Communication    Cognition Arousal: Alert Behavior During Therapy: WFL for tasks assessed/performed   PT - Cognitive impairments: No apparent impairments                                Cueing    Exercises Total Joint Exercises Ankle Circles/Pumps: AROM, Both, 10 reps, Supine Quad Sets: AROM, Both, 10 reps, Supine Heel Slides: AAROM, Left, 10 reps, Supine Hip ABduction/ADduction: AAROM, Left, 10 reps, Supine Long Arc Quad: AROM, Left, 5 reps, Seated Knee Flexion: AROM, Left, Seated, 10 reps Goniometric ROM: L knee 5 to 75 degrees    General Comments        Pertinent Vitals/Pain Pain Assessment Pain Assessment: 0-10 Pain Score: 3  Pain Location: L knee and buttock Pain Descriptors / Indicators: Discomfort  Pain Intervention(s): Limited activity within patient's tolerance, Monitored during session, Premedicated before session, Repositioned, Ice applied    Home Living                          Prior Function            PT Goals (current goals can now be found in the care plan section) Progress towards PT goals: Progressing toward goals    Frequency    7X/week      PT Plan      Co-evaluation              AM-PAC PT 6 Clicks Mobility   Outcome Measure  Help needed turning from your back to your side while in a flat bed without using bedrails?: A Little Help needed moving from lying on your back to  sitting on the side of a flat bed without using bedrails?: A Little Help needed moving to and from a bed to a chair (including a wheelchair)?: A Little Help needed standing up from a chair using your arms (e.g., wheelchair or bedside chair)?: A Little Help needed to walk in hospital room?: A Little Help needed climbing 3-5 steps with a railing? : A Little 6 Click Score: 18    End of Session Equipment Utilized During Treatment: Gait belt Activity Tolerance: Patient tolerated treatment well Patient left: with chair alarm set;in chair;with call bell/phone within reach;with family/visitor present Nurse Communication: Mobility status PT Visit Diagnosis: Other abnormalities of gait and mobility (R26.89);Muscle weakness (generalized) (M62.81)     Time: 8891-8864 PT Time Calculation (min) (ACUTE ONLY): 27 min  Charges:    $Gait Training: 8-22 mins $Therapeutic Exercise: 8-22 mins PT General Charges $$ ACUTE PT VISIT: 1 Visit                     Benjiman, PT Acute Rehab Services [facility] [phone]    Benjiman VEAR Mulberry 08/31/2024, 12:04 PM

## 2024-08-31 NOTE — TOC Transition Note (Signed)
 Transition of Care [facility]) - Discharge Note   Patient Details  Name: Amanda [name] MRN: [id number] Date of Birth: 1946/05/30  [facility] CM/SW Contact:  Alfonse JONELLE Rex, RN Phone Number: 08/31/2024, 10:23 AM   Clinical Narrative:   Met with patient at bedside to review dc therapy and home equipment needs. Patient confirmed she resides at [facility] , states HH PT and OPPT has already been arranged, RW delivered to bedside by [employer]. MOON completed. No TOC needs.     Final next level of care: OP Rehab Barriers to Discharge: No Barriers Identified   Patient Goals and CMS Choice Patient states their goals for this hospitalization and ongoing recovery are:: return to [facility]          Discharge Placement                       Discharge Plan and Services Additional resources added to the After Visit Summary for                  DME Arranged: Walker rolling DME Agency: [employer]                  Social Drivers of Health (SDOH) Interventions SDOH Screenings   Food Insecurity: No Food Insecurity (08/30/2024)  Housing: Low Risk  (08/30/2024)  Transportation Needs: No Transportation Needs (08/30/2024)  Utilities: Not At Risk (08/30/2024)  Social Connections: Unknown (08/30/2024)  Tobacco Use: Medium Risk (08/30/2024)     Readmission Risk Interventions     No data to display

## 2024-08-31 NOTE — Care Management Obs Status (Signed)
 MEDICARE OBSERVATION STATUS NOTIFICATION   Patient Details  Name: ELLIANAH CORDY MRN: [id number] Date of Birth: 01/20/1946   Medicare Observation Status Notification Given:  Yes    Alfonse JONELLE Rex, RN 08/31/2024, 9:40 AM

## 2024-09-01 NOTE — Discharge Summary (Signed)
 Patient ID: Amanda [name] MRN: [id number] DOB/AGE: [birth year] 78 y.o.  Admit date: 08/30/2024 Discharge date: 08/31/2024  Admission Diagnoses:  Principal Problem:   OA (osteoarthritis) of knee Active Problems:   Primary osteoarthritis of left knee   Discharge Diagnoses:  Same  Past Medical History:  Diagnosis Date   Adenopathy    Arthritis    Atrial fibrillation (HCC)    CLL (chronic lymphocytic leukemia) (HCC) 08/31/2013   Dysrhythmia    hx of atrial fib none in 10 years per pt   Heart murmur    HTN (hypertension)    taken off of meds 06/2024 due to blood pressure low per pt   Hyperlipidemia    Hypertensive retinopathy    Lymphocytosis    Macular degeneration    OU   PONV (postoperative nausea and vomiting)     Surgeries: Procedure(s): ARTHROPLASTY, KNEE, TOTAL on 08/30/2024   Consultants:   Discharged Condition: Improved  Hospital Course: YONA KOSEK is an 78 y.o. female who was admitted 08/30/2024 for operative treatment ofOA (osteoarthritis) of knee. Patient has severe unremitting pain that affects sleep, daily activities, and work/hobbies. After pre-op clearance the patient was taken to the operating room on 08/30/2024 and underwent  Procedure(s): ARTHROPLASTY, KNEE, TOTAL.    Patient was given perioperative antibiotics:  Anti-infectives (From admission, onward)    Start     Dose/Rate Route Frequency Ordered Stop   08/31/24 1000  acyclovir  (ZOVIRAX ) tablet 400 mg  Status:  Discontinued        400 mg Oral Daily 08/30/24 1246 08/31/24 2125   08/30/24 1500  ceFAZolin  (ANCEF ) IVPB 2g/100 mL premix        2 g 200 mL/hr over 30 Minutes Intravenous Every 6 hours 08/30/24 1250 08/30/24 2113   08/30/24 0715  ceFAZolin  (ANCEF ) IVPB 2g/100 mL premix        2 g 200 mL/hr over 30 Minutes Intravenous On call to O.R. 08/30/24 9297 08/30/24 0909        Patient was given sequential compression devices, early ambulation, and chemoprophylaxis to prevent  DVT.  Patient benefited maximally from hospital stay and there were no complications.    Recent vital signs: Patient Vitals for the past 24 hrs:  BP Temp Pulse Resp SpO2  08/31/24 1405 (!) 134/57 99.3 F (37.4 C) 64 14 96 %  08/31/24 0941 (!) 123/52 97.7 F (36.5 C) (!) 58 16 99 %     Recent laboratory studies:  Recent Labs    08/31/24 0344  WBC 15.2*  HGB 11.1*  HCT 34.2*  PLT 160  NA 142  K 3.7  CL 107  CO2 25  BUN 11  CREATININE 0.72  GLUCOSE 132*  CALCIUM 8.8*     Discharge Medications:   Allergies as of 08/31/2024       Reactions   Codeine Nausea And Vomiting   Demerol [meperidine] Nausea And Vomiting   Morphine And Codeine Nausea And Vomiting   Ciprofloxacin Rash   Sulfa Antibiotics Rash        Medication List     STOP taking these medications    Calcium + D3 600-200 MG-UNIT Tabs   Eye Vitamins & Minerals Tabs   folic acid 400 MCG tablet Commonly known as: FOLVITE   multivitamin tablet       TAKE these medications    acetaminophen  500 MG tablet Commonly known as: TYLENOL  Take 500-1,000 mg by mouth every 6 (six) hours as needed for moderate pain (pain score  4-6).   acyclovir  400 MG tablet Commonly known as: ZOVIRAX  Take 1 tablet (400 mg total) by mouth daily.   atorvastatin 40 MG tablet Commonly known as: LIPITOR Take 40 mg by mouth daily.   Calquence  100 MG tablet Generic drug: acalabrutinib  maleate Take 1 tablet (100 mg total) by mouth 2 (two) times daily.   fluticasone 50 MCG/ACT nasal spray Commonly known as: FLONASE Place 2 sprays into both nostrils daily.   hydrochlorothiazide 25 MG tablet Commonly known as: HYDRODIURIL Take 25 mg by mouth daily.   HYDROmorphone 2 MG tablet Commonly known as: DILAUDID Take 1-2 tablets (2-4 mg total) by mouth every 6 (six) hours as needed for severe pain (pain score 7-10).   hydroxypropyl methylcellulose / hypromellose 2.5 % ophthalmic solution Commonly known as: ISOPTO TEARS /  GONIOVISC Place 1 drop into both eyes as needed for dry eyes.   methocarbamol 500 MG tablet Commonly known as: ROBAXIN Take 1 tablet (500 mg total) by mouth every 6 (six) hours as needed for muscle spasms.   ondansetron  4 MG tablet Commonly known as: ZOFRAN  Take 1 tablet (4 mg total) by mouth every 6 (six) hours as needed for nausea.   potassium chloride SA 20 MEQ tablet Commonly known as: KLOR-CON M Take 20 mEq by mouth every Monday, Wednesday, and Friday.   Xarelto 10 MG Tabs tablet Generic drug: rivaroxaban Take 1 tablet (10 mg total) by mouth daily with breakfast. Then take one 81 mg aspirin once a day for three weeks. Then discontinue aspirin.               Discharge Care Instructions  (From admission, onward)           Start     Ordered   08/31/24 0000  Weight bearing as tolerated        08/31/24 0826   08/31/24 0000  Change dressing       Comments: You may remove the bulky bandage (ACE wrap and gauze) two days after surgery. You will have an adhesive waterproof bandage underneath. Leave this in place until your first follow-up appointment.   08/31/24 0826            Diagnostic Studies: No results found.  Disposition: Discharge disposition: 01-Home or Self Care       Discharge Instructions     Call MD / Call 911   Complete by: As directed    If you experience chest pain or shortness of breath, CALL 911 and be transported to the hospital emergency room.  If you develope a fever above 101 F, pus (white drainage) or increased drainage or redness at the wound, or calf pain, call your surgeon's office.   Change dressing   Complete by: As directed    You may remove the bulky bandage (ACE wrap and gauze) two days after surgery. You will have an adhesive waterproof bandage underneath. Leave this in place until your first follow-up appointment.   Constipation Prevention   Complete by: As directed    Drink plenty of fluids.  Prune juice may be helpful.   You may use a stool softener, such as Colace (over the counter) 100 mg twice a day.  Use MiraLax (over the counter) for constipation as needed.   Diet - low sodium heart healthy   Complete by: As directed    Do not put a pillow under the knee. Place it under the heel.   Complete by: As directed    Driving restrictions  Complete by: As directed    No driving for two weeks   Post-operative opioid taper instructions:   Complete by: As directed    POST-OPERATIVE OPIOID TAPER INSTRUCTIONS: It is important to wean off of your opioid medication as soon as possible. If you do not need pain medication after your surgery it is ok to stop day one. Opioids include: Codeine, Hydrocodone(Norco, Vicodin), Oxycodone(Percocet, oxycontin) and hydromorphone amongst others.  Long term and even short term use of opiods can cause: Increased pain response Dependence Constipation Depression Respiratory depression And more.  Withdrawal symptoms can include Flu like symptoms Nausea, vomiting And more Techniques to manage these symptoms Hydrate well Eat regular healthy meals Stay active Use relaxation techniques(deep breathing, meditating, yoga) Do Not substitute Alcohol to help with tapering If you have been on opioids for less than two weeks and do not have pain than it is ok to stop all together.  Plan to wean off of opioids This plan should start within one week post op of your joint replacement. Maintain the same interval or time between taking each dose and first decrease the dose.  Cut the total daily intake of opioids by one tablet each day Next start to increase the time between doses. The last dose that should be eliminated is the evening dose.      TED hose   Complete by: As directed    Use stockings (TED hose) for three weeks on both leg(s).  You may remove them at night for sleeping.   Weight bearing as tolerated   Complete by: As directed         Follow-up Information      Melodi Lerner, MD Follow up on 09/22/2024.   Specialty: Orthopedic Surgery Why: You are scheduled for a post op appointment on Wednesday 09/22/24 at 2:30pm Contact information: [address] [place] [address] [phone]                  Signed: Roxie Mess 09/01/2024, 7:48 AM

## 2024-09-02 ENCOUNTER — Other Ambulatory Visit (HOSPITAL_COMMUNITY): Payer: Self-pay

## 2024-09-03 ENCOUNTER — Other Ambulatory Visit: Payer: Self-pay

## 2024-09-03 ENCOUNTER — Other Ambulatory Visit (HOSPITAL_COMMUNITY): Payer: Self-pay

## 2024-09-03 DIAGNOSIS — R262 Difficulty in walking, not elsewhere classified: Secondary | ICD-10-CM | POA: Diagnosis not present

## 2024-09-03 DIAGNOSIS — M6281 Muscle weakness (generalized): Secondary | ICD-10-CM | POA: Diagnosis not present

## 2024-09-03 DIAGNOSIS — Z471 Aftercare following joint replacement surgery: Secondary | ICD-10-CM | POA: Diagnosis not present

## 2024-09-03 DIAGNOSIS — Z96652 Presence of left artificial knee joint: Secondary | ICD-10-CM | POA: Diagnosis not present

## 2024-09-06 ENCOUNTER — Encounter: Payer: Self-pay | Admitting: Hematology and Oncology

## 2024-09-06 ENCOUNTER — Inpatient Hospital Stay: Admitting: Hematology and Oncology

## 2024-09-06 ENCOUNTER — Other Ambulatory Visit (HOSPITAL_COMMUNITY): Payer: Self-pay

## 2024-09-06 ENCOUNTER — Inpatient Hospital Stay: Attending: Hematology and Oncology

## 2024-09-06 VITALS — BP 141/70 | HR 102 | Temp 100.1°F | Resp 18 | Ht 62.0 in | Wt 147.6 lb

## 2024-09-06 DIAGNOSIS — D500 Iron deficiency anemia secondary to blood loss (chronic): Secondary | ICD-10-CM | POA: Insufficient documentation

## 2024-09-06 DIAGNOSIS — C9110 Chronic lymphocytic leukemia of B-cell type not having achieved remission: Secondary | ICD-10-CM | POA: Diagnosis not present

## 2024-09-06 DIAGNOSIS — Z79899 Other long term (current) drug therapy: Secondary | ICD-10-CM | POA: Insufficient documentation

## 2024-09-06 LAB — CBC WITH DIFFERENTIAL/PLATELET
Abs Immature Granulocytes: 0.3 K/uL — ABNORMAL HIGH (ref 0.00–0.07)
Basophils Absolute: 0 K/uL (ref 0.0–0.1)
Basophils Relative: 0 %
Eosinophils Absolute: 0.5 K/uL (ref 0.0–0.5)
Eosinophils Relative: 3 %
HCT: 35.5 % — ABNORMAL LOW (ref 36.0–46.0)
Hemoglobin: 11.8 g/dL — ABNORMAL LOW (ref 12.0–15.0)
Lymphocytes Relative: 37 %
Lymphs Abs: 6.3 K/uL — ABNORMAL HIGH (ref 0.7–4.0)
MCH: 28 pg (ref 26.0–34.0)
MCHC: 33.2 g/dL (ref 30.0–36.0)
MCV: 84.1 fL (ref 80.0–100.0)
Metamyelocytes Relative: 2 %
Monocytes Absolute: 1.5 K/uL — ABNORMAL HIGH (ref 0.1–1.0)
Monocytes Relative: 9 %
Neutro Abs: 8.4 K/uL — ABNORMAL HIGH (ref 1.7–7.7)
Neutrophils Relative %: 49 %
Platelets: 302 K/uL (ref 150–400)
RBC: 4.22 MIL/uL (ref 3.87–5.11)
RDW: 13.5 % (ref 11.5–15.5)
Smear Review: NORMAL
WBC: 17.1 K/uL — ABNORMAL HIGH (ref 4.0–10.5)
nRBC: 0 % (ref 0.0–0.2)

## 2024-09-06 LAB — COMPREHENSIVE METABOLIC PANEL WITH GFR
ALT: 21 U/L (ref 0–44)
AST: 17 U/L (ref 15–41)
Albumin: 3.8 g/dL (ref 3.5–5.0)
Alkaline Phosphatase: 73 U/L (ref 38–126)
Anion gap: 7 (ref 5–15)
BUN: 16 mg/dL (ref 8–23)
CO2: 31 mmol/L (ref 22–32)
Calcium: 9.3 mg/dL (ref 8.9–10.3)
Chloride: 100 mmol/L (ref 98–111)
Creatinine, Ser: 0.77 mg/dL (ref 0.44–1.00)
GFR, Estimated: >60 mL/min (ref >60)
Glucose, Bld: 88 mg/dL (ref 70–99)
Potassium: 3.9 mmol/L (ref 3.5–5.1)
Sodium: 138 mmol/L (ref 135–145)
Total Bilirubin: 1.1 mg/dL (ref 0.0–1.2)
Total Protein: 7 g/dL (ref 6.5–8.1)

## 2024-09-06 NOTE — Assessment & Plan Note (Addendum)
 She was diagnosed with chronic lymphocytic leukemia since April 2014 FISH positive for deletion 13 q.  She was treated with Bendamustine  and rituximab  between October 2018 to January 2019 but relapsed in 2014 She has been on maintenance Calquence  since May 2024 CT imaging from July 2025 showed complete response to therapy The plan will be to continue maintenance Calquence  for at least 2 years from now before discontinuation Of treatment was recently placed on hold due to knee surgery She is recovering well but noticed excessive bruising I recommend holding off restarting Calquence  until she see her orthopedic surgeon and off Xarelto I will see her again in 2 months

## 2024-09-06 NOTE — Assessment & Plan Note (Addendum)
 She has slight anemia likely due to surgical blood loss She is not symptomatic Observe only

## 2024-09-06 NOTE — Progress Notes (Signed)
 [facility] OFFICE PROGRESS NOTE  Patient Care Team: Aisha Harvey, MD as PCP - General (Family Medicine) O'Neal, Darryle Ned, MD as PCP - Cardiology (Cardiology) Watt Rush, MD as Attending Physician (Urology) Blanca Elsie RAMAN, MD as Attending Physician (Cardiology) Lonn Hicks, MD as Consulting Physician (Hematology and Oncology)  Assessment & Plan CLL (chronic lymphocytic leukemia) [facility]) She was diagnosed with chronic lymphocytic leukemia since April 2014 FISH positive for deletion 13 q.  She was treated with Bendamustine  and rituximab  between October 2018 to January 2019 but relapsed in 2014 She has been on maintenance Calquence  since May 2024 CT imaging from July 2025 showed complete response to therapy The plan will be to continue maintenance Calquence  for at least 2 years from now before discontinuation Of treatment was recently placed on hold due to knee surgery She is recovering well but noticed excessive bruising I recommend holding off restarting Calquence  until she see her orthopedic surgeon and off Xarelto I will see her again in 2 months Anemia, blood loss She has slight anemia likely due to surgical blood loss She is not symptomatic Observe only  No orders of the defined types were placed in this encounter.    Hicks Lonn, MD  INTERVAL HISTORY: she returns for surveillance follow-up after recent knee surgery, and background history of Calquence  treatment for recurrent CLL She is accompanied by her daughter She has a lot of bruising near her knee but appears to be recovering well I reviewed CBC result with the patient She also developed some minor skin rash on her back and related to her condition of which I recommend over-the-counter Benadryl  and topical steroid cream  PHYSICAL EXAMINATION: ECOG PERFORMANCE STATUS: 1 - Symptomatic but completely ambulatory  Vitals:   09/06/24 1053  BP: (!) 141/70  Pulse: (!) 102  Resp: 18  Temp: 100.1  F (37.8 C)  SpO2: 100%   Filed Weights   09/06/24 1053  Weight: 147 lb 9.6 oz (67 kg)    Relevant data reviewed during this visit included CBC and CMP

## 2024-09-07 ENCOUNTER — Other Ambulatory Visit: Payer: Self-pay

## 2024-09-07 ENCOUNTER — Telehealth: Payer: Self-pay

## 2024-09-07 ENCOUNTER — Other Ambulatory Visit (HOSPITAL_COMMUNITY): Payer: Self-pay

## 2024-09-07 DIAGNOSIS — Z96652 Presence of left artificial knee joint: Secondary | ICD-10-CM | POA: Diagnosis not present

## 2024-09-07 DIAGNOSIS — Z471 Aftercare following joint replacement surgery: Secondary | ICD-10-CM | POA: Diagnosis not present

## 2024-09-07 DIAGNOSIS — R262 Difficulty in walking, not elsewhere classified: Secondary | ICD-10-CM | POA: Diagnosis not present

## 2024-09-07 DIAGNOSIS — M6281 Muscle weakness (generalized): Secondary | ICD-10-CM | POA: Diagnosis not present

## 2024-09-07 MED ORDER — HYDROMORPHONE HCL 2 MG PO TABS
2.0000 mg | ORAL_TABLET | Freq: Three times a day (TID) | ORAL | 0 refills | Status: DC | PRN
  Filled 2024-09-07: qty 30, 5d supply, fill #0

## 2024-09-07 NOTE — Telephone Encounter (Signed)
 Oral Oncology Patient Advocate Encounter  Was successful in securing patient a $8000 grant from [facility] to provide copayment coverage for Calquence .  This will keep the out of pocket expense at $0.     [employer] ID: [id number]    The billing information is as follows and has been shared with [facility].    RxBin: [id number] PCN: [id number] Member ID: [id number] Group ID: [id number] Dates of Eligibility: 08/31/24 through 08/30/25  Fund:  CLL  Lucie Lamer, CPhT [place]  [facility] Oncology Pharmacy Patient Advocate Specialist [facility] [place] Phone: [phone]  Fax: [phone] [email or web address]

## 2024-09-08 ENCOUNTER — Ambulatory Visit (INDEPENDENT_AMBULATORY_CARE_PROVIDER_SITE_OTHER): Admitting: Ophthalmology

## 2024-09-08 ENCOUNTER — Encounter (INDEPENDENT_AMBULATORY_CARE_PROVIDER_SITE_OTHER): Payer: Self-pay | Admitting: Ophthalmology

## 2024-09-08 DIAGNOSIS — H35033 Hypertensive retinopathy, bilateral: Secondary | ICD-10-CM

## 2024-09-08 DIAGNOSIS — H353231 Exudative age-related macular degeneration, bilateral, with active choroidal neovascularization: Secondary | ICD-10-CM | POA: Diagnosis not present

## 2024-09-08 DIAGNOSIS — I10 Essential (primary) hypertension: Secondary | ICD-10-CM

## 2024-09-08 DIAGNOSIS — H353211 Exudative age-related macular degeneration, right eye, with active choroidal neovascularization: Secondary | ICD-10-CM

## 2024-09-08 DIAGNOSIS — H353221 Exudative age-related macular degeneration, left eye, with active choroidal neovascularization: Secondary | ICD-10-CM

## 2024-09-08 DIAGNOSIS — Z961 Presence of intraocular lens: Secondary | ICD-10-CM

## 2024-09-08 MED ORDER — FARICIMAB-SVOA 6 MG/0.05ML IZ SOSY
6.0000 mg | PREFILLED_SYRINGE | INTRAVITREAL | Status: AC | PRN
  Administered 2024-09-08: 6 mg via INTRAVITREAL

## 2024-09-09 ENCOUNTER — Other Ambulatory Visit: Payer: Self-pay

## 2024-09-09 DIAGNOSIS — R262 Difficulty in walking, not elsewhere classified: Secondary | ICD-10-CM | POA: Diagnosis not present

## 2024-09-09 DIAGNOSIS — Z471 Aftercare following joint replacement surgery: Secondary | ICD-10-CM | POA: Diagnosis not present

## 2024-09-09 DIAGNOSIS — M6281 Muscle weakness (generalized): Secondary | ICD-10-CM | POA: Diagnosis not present

## 2024-09-09 DIAGNOSIS — Z96652 Presence of left artificial knee joint: Secondary | ICD-10-CM | POA: Diagnosis not present

## 2024-09-10 DIAGNOSIS — I10 Essential (primary) hypertension: Secondary | ICD-10-CM | POA: Diagnosis not present

## 2024-09-10 DIAGNOSIS — M6281 Muscle weakness (generalized): Secondary | ICD-10-CM | POA: Diagnosis not present

## 2024-09-10 DIAGNOSIS — I480 Paroxysmal atrial fibrillation: Secondary | ICD-10-CM | POA: Diagnosis not present

## 2024-09-10 DIAGNOSIS — E782 Mixed hyperlipidemia: Secondary | ICD-10-CM | POA: Diagnosis not present

## 2024-09-10 DIAGNOSIS — R262 Difficulty in walking, not elsewhere classified: Secondary | ICD-10-CM | POA: Diagnosis not present

## 2024-09-10 DIAGNOSIS — Z96652 Presence of left artificial knee joint: Secondary | ICD-10-CM | POA: Diagnosis not present

## 2024-09-10 DIAGNOSIS — Z471 Aftercare following joint replacement surgery: Secondary | ICD-10-CM | POA: Diagnosis not present

## 2024-09-13 DIAGNOSIS — Z471 Aftercare following joint replacement surgery: Secondary | ICD-10-CM | POA: Diagnosis not present

## 2024-09-13 DIAGNOSIS — R262 Difficulty in walking, not elsewhere classified: Secondary | ICD-10-CM | POA: Diagnosis not present

## 2024-09-13 DIAGNOSIS — M6281 Muscle weakness (generalized): Secondary | ICD-10-CM | POA: Diagnosis not present

## 2024-09-13 DIAGNOSIS — Z96652 Presence of left artificial knee joint: Secondary | ICD-10-CM | POA: Diagnosis not present

## 2024-09-14 DIAGNOSIS — Z471 Aftercare following joint replacement surgery: Secondary | ICD-10-CM | POA: Diagnosis not present

## 2024-09-14 DIAGNOSIS — M6281 Muscle weakness (generalized): Secondary | ICD-10-CM | POA: Diagnosis not present

## 2024-09-14 DIAGNOSIS — Z96652 Presence of left artificial knee joint: Secondary | ICD-10-CM | POA: Diagnosis not present

## 2024-09-14 DIAGNOSIS — R262 Difficulty in walking, not elsewhere classified: Secondary | ICD-10-CM | POA: Diagnosis not present

## 2024-09-15 ENCOUNTER — Other Ambulatory Visit: Payer: Self-pay

## 2024-09-16 ENCOUNTER — Other Ambulatory Visit: Payer: Self-pay

## 2024-09-16 ENCOUNTER — Other Ambulatory Visit (HOSPITAL_COMMUNITY): Payer: Self-pay

## 2024-09-16 DIAGNOSIS — Z96652 Presence of left artificial knee joint: Secondary | ICD-10-CM | POA: Diagnosis not present

## 2024-09-16 DIAGNOSIS — M6281 Muscle weakness (generalized): Secondary | ICD-10-CM | POA: Diagnosis not present

## 2024-09-16 DIAGNOSIS — R262 Difficulty in walking, not elsewhere classified: Secondary | ICD-10-CM | POA: Diagnosis not present

## 2024-09-16 DIAGNOSIS — Z471 Aftercare following joint replacement surgery: Secondary | ICD-10-CM | POA: Diagnosis not present

## 2024-09-16 MED ORDER — METHOCARBAMOL 500 MG PO TABS
500.0000 mg | ORAL_TABLET | Freq: Four times a day (QID) | ORAL | 0 refills | Status: AC | PRN
  Filled 2024-09-16: qty 30, 8d supply, fill #0

## 2024-09-20 DIAGNOSIS — R262 Difficulty in walking, not elsewhere classified: Secondary | ICD-10-CM | POA: Diagnosis not present

## 2024-09-20 DIAGNOSIS — M6281 Muscle weakness (generalized): Secondary | ICD-10-CM | POA: Diagnosis not present

## 2024-09-20 DIAGNOSIS — Z96652 Presence of left artificial knee joint: Secondary | ICD-10-CM | POA: Diagnosis not present

## 2024-09-20 DIAGNOSIS — Z471 Aftercare following joint replacement surgery: Secondary | ICD-10-CM | POA: Diagnosis not present

## 2024-09-21 DIAGNOSIS — Z96652 Presence of left artificial knee joint: Secondary | ICD-10-CM | POA: Diagnosis not present

## 2024-09-21 DIAGNOSIS — Z471 Aftercare following joint replacement surgery: Secondary | ICD-10-CM | POA: Diagnosis not present

## 2024-09-21 DIAGNOSIS — M6281 Muscle weakness (generalized): Secondary | ICD-10-CM | POA: Diagnosis not present

## 2024-09-21 DIAGNOSIS — R262 Difficulty in walking, not elsewhere classified: Secondary | ICD-10-CM | POA: Diagnosis not present

## 2024-09-23 DIAGNOSIS — Z96652 Presence of left artificial knee joint: Secondary | ICD-10-CM | POA: Diagnosis not present

## 2024-09-23 DIAGNOSIS — M6281 Muscle weakness (generalized): Secondary | ICD-10-CM | POA: Diagnosis not present

## 2024-09-23 DIAGNOSIS — Z471 Aftercare following joint replacement surgery: Secondary | ICD-10-CM | POA: Diagnosis not present

## 2024-09-23 DIAGNOSIS — R262 Difficulty in walking, not elsewhere classified: Secondary | ICD-10-CM | POA: Diagnosis not present

## 2024-09-24 ENCOUNTER — Other Ambulatory Visit (HOSPITAL_COMMUNITY): Payer: Self-pay

## 2024-09-27 DIAGNOSIS — R262 Difficulty in walking, not elsewhere classified: Secondary | ICD-10-CM | POA: Diagnosis not present

## 2024-09-27 DIAGNOSIS — Z471 Aftercare following joint replacement surgery: Secondary | ICD-10-CM | POA: Diagnosis not present

## 2024-09-27 DIAGNOSIS — Z96652 Presence of left artificial knee joint: Secondary | ICD-10-CM | POA: Diagnosis not present

## 2024-09-27 DIAGNOSIS — M6281 Muscle weakness (generalized): Secondary | ICD-10-CM | POA: Diagnosis not present

## 2024-09-28 DIAGNOSIS — Z96652 Presence of left artificial knee joint: Secondary | ICD-10-CM | POA: Diagnosis not present

## 2024-09-28 DIAGNOSIS — Z471 Aftercare following joint replacement surgery: Secondary | ICD-10-CM | POA: Diagnosis not present

## 2024-09-28 DIAGNOSIS — M6281 Muscle weakness (generalized): Secondary | ICD-10-CM | POA: Diagnosis not present

## 2024-09-28 DIAGNOSIS — R262 Difficulty in walking, not elsewhere classified: Secondary | ICD-10-CM | POA: Diagnosis not present

## 2024-09-29 ENCOUNTER — Other Ambulatory Visit (HOSPITAL_COMMUNITY): Payer: Self-pay

## 2024-09-29 ENCOUNTER — Other Ambulatory Visit: Payer: Self-pay

## 2024-09-29 DIAGNOSIS — R262 Difficulty in walking, not elsewhere classified: Secondary | ICD-10-CM | POA: Diagnosis not present

## 2024-09-29 DIAGNOSIS — Z471 Aftercare following joint replacement surgery: Secondary | ICD-10-CM | POA: Diagnosis not present

## 2024-09-29 DIAGNOSIS — Z96652 Presence of left artificial knee joint: Secondary | ICD-10-CM | POA: Diagnosis not present

## 2024-09-29 DIAGNOSIS — I480 Paroxysmal atrial fibrillation: Secondary | ICD-10-CM | POA: Diagnosis not present

## 2024-09-29 DIAGNOSIS — M6281 Muscle weakness (generalized): Secondary | ICD-10-CM | POA: Diagnosis not present

## 2024-09-29 DIAGNOSIS — I10 Essential (primary) hypertension: Secondary | ICD-10-CM | POA: Diagnosis not present

## 2024-09-29 NOTE — Progress Notes (Signed)
 Specialty Pharmacy Refill Coordination Note  Amanda [name] is a 78 y.o. female contacted today regarding refills of specialty medication(s) Acalabrutinib  Maleate (Calquence )   Patient requested Delivery   Delivery date: 10/01/24   Verified address: [address]  [place] [address]   Medication will be filled on: 09/30/24

## 2024-10-01 DIAGNOSIS — Z96652 Presence of left artificial knee joint: Secondary | ICD-10-CM | POA: Diagnosis not present

## 2024-10-01 DIAGNOSIS — M6281 Muscle weakness (generalized): Secondary | ICD-10-CM | POA: Diagnosis not present

## 2024-10-01 DIAGNOSIS — Z471 Aftercare following joint replacement surgery: Secondary | ICD-10-CM | POA: Diagnosis not present

## 2024-10-01 DIAGNOSIS — R262 Difficulty in walking, not elsewhere classified: Secondary | ICD-10-CM | POA: Diagnosis not present

## 2024-10-04 DIAGNOSIS — M6281 Muscle weakness (generalized): Secondary | ICD-10-CM | POA: Diagnosis not present

## 2024-10-04 DIAGNOSIS — Z96652 Presence of left artificial knee joint: Secondary | ICD-10-CM | POA: Diagnosis not present

## 2024-10-04 DIAGNOSIS — R262 Difficulty in walking, not elsewhere classified: Secondary | ICD-10-CM | POA: Diagnosis not present

## 2024-10-04 DIAGNOSIS — Z471 Aftercare following joint replacement surgery: Secondary | ICD-10-CM | POA: Diagnosis not present

## 2024-10-05 ENCOUNTER — Other Ambulatory Visit (HOSPITAL_COMMUNITY): Payer: Self-pay

## 2024-10-05 ENCOUNTER — Other Ambulatory Visit: Payer: Self-pay

## 2024-10-06 DIAGNOSIS — Z96652 Presence of left artificial knee joint: Secondary | ICD-10-CM | POA: Diagnosis not present

## 2024-10-06 DIAGNOSIS — M6281 Muscle weakness (generalized): Secondary | ICD-10-CM | POA: Diagnosis not present

## 2024-10-06 DIAGNOSIS — Z471 Aftercare following joint replacement surgery: Secondary | ICD-10-CM | POA: Diagnosis not present

## 2024-10-06 DIAGNOSIS — R262 Difficulty in walking, not elsewhere classified: Secondary | ICD-10-CM | POA: Diagnosis not present

## 2024-10-08 DIAGNOSIS — M6281 Muscle weakness (generalized): Secondary | ICD-10-CM | POA: Diagnosis not present

## 2024-10-08 DIAGNOSIS — R262 Difficulty in walking, not elsewhere classified: Secondary | ICD-10-CM | POA: Diagnosis not present

## 2024-10-08 DIAGNOSIS — Z471 Aftercare following joint replacement surgery: Secondary | ICD-10-CM | POA: Diagnosis not present

## 2024-10-08 DIAGNOSIS — Z96652 Presence of left artificial knee joint: Secondary | ICD-10-CM | POA: Diagnosis not present

## 2024-10-10 DIAGNOSIS — I480 Paroxysmal atrial fibrillation: Secondary | ICD-10-CM | POA: Diagnosis not present

## 2024-10-10 DIAGNOSIS — I10 Essential (primary) hypertension: Secondary | ICD-10-CM | POA: Diagnosis not present

## 2024-10-10 DIAGNOSIS — E782 Mixed hyperlipidemia: Secondary | ICD-10-CM | POA: Diagnosis not present

## 2024-10-13 NOTE — Progress Notes (Signed)
 [facility] - Clinic Note  10/20/2024    CHIEF COMPLAINT Patient presents for Retina Follow Up  HISTORY OF PRESENT ILLNESS: Amanda [name] is a 78 y.o. female who presents to the clinic today for:  HPI     Retina Follow Up   Patient presents with  Wet AMD.  In both eyes.  This started 7 weeks ago.  Duration of 7 weeks.  Since onset it is stable.  I, the attending physician,  performed the HPI with the patient and updated documentation appropriately.        Comments   7 week retina follow up ARMD OU and IVV OU pt is reporting no vision changes she denies any flashes has some floaters she had left knee replacement last Monday       Last edited by Valdemar Rogue, MD on 10/20/2024  8:36 PM.     Patient states her vision is the same, no issues.   Referring physician: Clem Brands, PA-C [facility] [address] [place],  [place] [address]  HISTORICAL INFORMATION:  Selected notes from the MEDICAL RECORD NUMBER Referred by Clem Brands, PA on 11.16.21 for eval of dry to wet AMD conversion OS.   CURRENT MEDICATIONS: Current Outpatient Medications (Ophthalmic Drugs)  Medication Sig   hydroxypropyl methylcellulose / hypromellose (ISOPTO TEARS / GONIOVISC) 2.5 % ophthalmic solution Place 1 drop into both eyes as needed for dry eyes.   No current facility-administered medications for this visit. (Ophthalmic Drugs)   Current Outpatient Medications (Other)  Medication Sig   acalabrutinib  maleate (CALQUENCE ) 100 MG tablet Take 1 tablet (100 mg total) by mouth 2 (two) times daily. (Patient not taking: Reported on 10/20/2024)   acetaminophen  (TYLENOL ) 500 MG tablet Take 500-1,000 mg by mouth every 6 (six) hours as needed for moderate pain (pain score 4-6).   acyclovir  (ZOVIRAX ) 400 MG tablet Take 1 tablet (400 mg total) by mouth daily.   atorvastatin  (LIPITOR) 40 MG tablet Take 40 mg by mouth daily.   fluticasone  (FLONASE ) 50 MCG/ACT  nasal spray Place 2 sprays into both nostrils daily.   hydrochlorothiazide  (HYDRODIURIL ) 25 MG tablet Take 25 mg by mouth daily.   HYDROmorphone  (DILAUDID ) 2 MG tablet Take 1-2 tablets (2-4 mg total) by mouth every 6 (six) hours as needed for severe pain (pain score 7-10).   HYDROmorphone  (DILAUDID ) 2 MG tablet Take 1-2 tablets (2-4 mg total) by mouth every 8 (eight) hours as needed for severe pain   methocarbamol  (ROBAXIN ) 500 MG tablet Take 1 tablet (500 mg total) by mouth every 6 (six) hours as needed for muscle spasm.   ondansetron  (ZOFRAN ) 4 MG tablet Take 1 tablet (4 mg total) by mouth every 6 (six) hours as needed for nausea.   potassium chloride  SA (K-DUR,KLOR-CON ) 20 MEQ tablet Take 20 mEq by mouth every Monday, Wednesday, and Friday.   rivaroxaban  (XARELTO ) 10 MG TABS tablet Take 1 tablet (10 mg total) by mouth daily with breakfast. Then take one 81 mg aspirin once a day for three weeks. Then discontinue aspirin.   No current facility-administered medications for this visit. (Other)   REVIEW OF SYSTEMS: ROS   Positive for: Cardiovascular, Eyes Negative for: Constitutional, Gastrointestinal, Neurological, Skin, Genitourinary, Musculoskeletal, HENT, Endocrine, Respiratory, Psychiatric, Allergic/Imm, Heme/Lymph Last edited by Elnor Avelina RAMAN, COT on 10/20/2024  1:58 PM.      ALLERGIES Allergies  Allergen Reactions   Codeine Nausea And Vomiting   Demerol [Meperidine] Nausea And  Vomiting   Morphine And Codeine Nausea And Vomiting   Ciprofloxacin Rash   Sulfa Antibiotics Rash   PAST MEDICAL HISTORY Past Medical History:  Diagnosis Date   Adenopathy    Arthritis    Atrial fibrillation (HCC)    CLL (chronic lymphocytic leukemia) (HCC) 08/31/2013   Dysrhythmia    hx of atrial fib none in 10 years per pt   Heart murmur    HTN (hypertension)    taken off of meds 06/2024 due to blood pressure low per pt   Hyperlipidemia    Hypertensive retinopathy    Lymphocytosis    Macular  degeneration    OU   PONV (postoperative nausea and vomiting)    Past Surgical History:  Procedure Laterality Date   ABDOMINAL HYSTERECTOMY     endometriosis, fibroid   APPENDECTOMY     BREAST BIOPSY     benign cyst.   CATARACT EXTRACTION Bilateral 2019   CHOLECYSTECTOMY     COLONOSCOPY  11/2012   Per Dr. Donnald.  neg.    EYE SURGERY     IR FLUORO GUIDE PORT INSERTION RIGHT  08/12/2017   IR REMOVAL TUN ACCESS W/ PORT W/O FL MOD SED  01/29/2018   IR US  GUIDE VASC ACCESS RIGHT  08/12/2017   SQUAMOUS CELL CARCINOMA EXCISION  12/28/2020   Excised from top of head   THUMB ARTHROSCOPY     TOTAL HIP ARTHROPLASTY  2010   TOTAL KNEE ARTHROPLASTY Left 08/30/2024   Procedure: ARTHROPLASTY, KNEE, TOTAL;  Surgeon: Melodi Lerner, MD;  Location: [facility];  Service: Orthopedics;  Laterality: Left;   FAMILY HISTORY Family History  Problem Relation Age of Onset   Renal Disease Mother    Cancer Mother        colon cancer   Cancer Father        colon cancer   SOCIAL HISTORY Social History   Tobacco Use   Smoking status: Former    Types: Cigarettes   Smokeless tobacco: Never  Vaping Use   Vaping status: Never Used  Substance Use Topics   Alcohol  use: Yes    Comment: occ glass of wine   Drug use: No       OPHTHALMIC EXAM: Base Eye Exam     Visual Acuity (Snellen - Linear)       Right Left   Dist cc 20/20 20/20 -1    Correction: Glasses         Tonometry (Tonopen, 2:03 PM)       Right Left   Pressure 14 13         Pupils       Pupils Dark Light Shape React APD   Right PERRL 3 2 Round Brisk None   Left PERRL 3 2 Round Brisk None         Visual Fields       Left Right    Full Full         Extraocular Movement       Right Left    Full, Ortho Full, Ortho         Neuro/Psych     Oriented x3: Yes   Mood/Affect: Normal         Dilation     Both eyes: 1.0% Mydriacyl, 2.5% Phenylephrine @ 2:05 PM           Slit Lamp and Fundus Exam      Slit Lamp Exam       Right Left  Lids/Lashes Dermatochalasis - upper lid, mild Meibomian gland dysfunction Dermatochalasis - upper lid, mild Meibomian gland dysfunction   Conjunctiva/Sclera White and quiet White and quiet   Cornea Trace Debris in tear film, well healed temporal cataract wounds, 1+ pigmented Guttata, trace PEE well healed temporal cataract wounds, 2+ Punctate epithelial erosions   Anterior Chamber Deep and quiet Deep and quiet   Iris Round and dilated Round and dilated   Lens PC IOL in good position, trace PCO PC IOL in good position   Anterior Vitreous Trace Vitreous syneresis, Posterior vitreous detachment Trace Vitreous syneresis, Posterior vitreous detachment         Fundus Exam       Right Left   Disc Compact, trace pallor, Sharp rim, mild PPA Compact, Pink and Sharp, temporal PPP   C/D Ratio 0.1 0.2   Macula Blunted foveal reflex, Drusen, RPE mottling, clumping and early atrophy, shallow PED with trace SRF- improved Blunted foveal reflex, +CNV, Drusen, RPE mottling and clumping; trace shallow SRF--stably improved, no heme   Vessels attenuated, Tortuous attenuated, Tortuous   Periphery Attached; mild reticular degeneration, no heme Attached, reticular degeneration, No heme           Refraction     Wearing Rx       Sphere Cylinder Axis Add   Right -0.25 +0.75 165 +2.50   Left -0.50 +0.50 012 +2.50    Type: PAL           IMAGING AND PROCEDURES  Imaging and Procedures for 10/20/2024  OCT, Retina - OU - Both Eyes       Right Eye Quality was good. Central Foveal Thickness: 248. Progression has improved. Findings include normal foveal contour, no IRF, no SRF, retinal drusen , subretinal hyper-reflective material, intraretinal hyper-reflective material, pigment epithelial detachment, subretinal fluid, outer retinal atrophy (Interval resolution of shallow SRF centrally overlying stable low PED, no IRF).   Left Eye Quality was good. Central  Foveal Thickness: 279. Progression has been stable. Findings include no IRF, no SRF, abnormal foveal contour, retinal drusen , subretinal hyper-reflective material, choroidal neovascular membrane, pigment epithelial detachment, outer retinal atrophy (Stable improvement in shallow SRF overlying PED ).   Notes *Images captured and stored on drive  Diagnosis / Impression:  OD: exu ARMD -- Interval resolution of shallow SRF centrally overlying stable low PED, no IRF OS: exu ARMD -- Stable improvement in shallow SRF overlying PED   Clinical management:  See below  Abbreviations: NFP - Normal foveal profile. CME - cystoid macular edema. PED - pigment epithelial detachment. IRF - intraretinal fluid. SRF - subretinal fluid. EZ - ellipsoid zone. ERM - epiretinal membrane. ORA - outer retinal atrophy. ORT - outer retinal tubulation. SRHM - subretinal hyper-reflective material. IRHM - intraretinal hyper-reflective material      Intravitreal Injection, Pharmacologic Agent - OS - Left Eye       Time Out 10/20/2024. 2:43 PM. Confirmed correct patient, procedure, site, and patient consented.   Anesthesia Topical anesthesia was used. Anesthetic medications included Lidocaine  2%, Proparacaine 0.5%.   Procedure Preparation included 5% betadine  to ocular surface, eyelid speculum. A supplied (32g) needle was used.   Injection: 6 mg faricimab -svoa 6 MG/0.05ML   Route: Intravitreal, Site: Left Eye   NDC: 49757-903-93, Lot: [id number], Expiration date: 12/11/2025, Waste: 0 mL   Post-op Post injection exam found visual acuity of at least counting fingers. The patient tolerated the procedure well. There were no complications. The patient received written and verbal post procedure  care education.      Intravitreal Injection, Pharmacologic Agent - OD - Right Eye       Time Out 10/20/2024. 2:44 PM. Confirmed correct patient, procedure, site, and patient consented.   Anesthesia Topical anesthesia was  used. Anesthetic medications included Lidocaine  2%, Proparacaine 0.5%.   Procedure Preparation included 5% betadine  to ocular surface, eyelid speculum. A supplied (32g) needle was used.   Injection: 6 mg faricimab -svoa 6 MG/0.05ML   Route: Intravitreal, Site: Right Eye   NDC: 49757-903-93, Lot: [id number], Expiration date: 08/10/2025, Waste: 0 mL   Post-op Post injection exam found visual acuity of at least counting fingers. The patient tolerated the procedure well. There were no complications. The patient received written and verbal post procedure care education. Post injection medications were not given.              ASSESSMENT/PLAN:    ICD-10-CM   1. Exudative age-related macular degeneration of left eye with active choroidal neovascularization (HCC)  H35.3221 OCT, Retina - OU - Both Eyes    Intravitreal Injection, Pharmacologic Agent - OS - Left Eye    faricimab -svoa (VABYSMO ) 6mg /0.33mL intravitreal injection    2. Exudative age-related macular degeneration of right eye with active choroidal neovascularization (HCC)  H35.3211 OCT, Retina - OU - Both Eyes    Intravitreal Injection, Pharmacologic Agent - OD - Right Eye    faricimab -svoa (VABYSMO ) 6mg /0.30mL intravitreal injection    3. Essential hypertension  I10     4. Hypertensive retinopathy of both eyes  H35.033     5. Pseudophakia, both eyes  Z96.1      1. Exudative age related macular degeneration, OS  - [facility] pt with known history of nonexudative ARMD OU - acute haze over vision OS -- onset, Friday 11.12.21 -- initial exam with central subretinal heme - s/p IVA OS #1 (11.17.21), #2 (12.15.21), #3 (01.12.22), #4 (02.09.22), #5 (03.16.22), #6 (04.20.22), #7 (06.01.22), #8 (06.29.22)  -- IVA resistance ========================= - s/p IVE OS #1 (07.27.22), #2 (08.24.22), #3 (09.21.22), #4 (10.19.22), #5 (11.16.22), #6 (12.16.22), #7 (01.18.23), #8 (02.15.23)  -- IVE resistance ========================= -  s/p IVV OS #1 SAMPLE (03.15.23), #2 (04.12.23), #3 (05.10.23), #4 (06.07.23), #5 (07.19.23), #6 (08.22.23), #7 (09.27.23), #8 (12.20.23), #9 (01.31.24), #10 (03.13.24), #11 (04.24.24), #12 (05.31.24), #13 (07.09.24), #14 (08.21.24), #15 (10.02.24), #16 (11.13.24), #17 (12.18.24), #18 (01.22.25), #19 (02.26.25), #20 (04.02.25), #21 (05.07.25), #22 (06.18.25), #23 (07.30.25), #24 (09.10.25), #25 (10.29.25) **h/o increased fluid at 6 wks on 11.13.24 and 07.30.25**  - BCVA OS 20/20 - stable - exam and OCT show OS: Stable improvement in shallow SRF overlying PED at 6 wks  - recommend IVV OS #26 today, 12.10.25 with follow up in 6 weeks (for OD) - pt wishes to be treated with IVV OS - RBA of procedure discussed, questions answered - IVV informed consent obtained and signed 01.22.25 (OS) - see procedure note   - f/u 6 weeks -- DFE/OCT, possible injections  2. Exudative age related macular degeneration OD  - conversion to exudative ARMD noted on 03.15.23 exam  - exam with new SRH temporal macula -- stably improved today - s/p IVE OD #1 (03.15.23), #2 (04.12.23), #3 (05.10.23), #4 (06.07.23), #5 (07.19.23), #6 (08.19.23), #7 (09.27.23), #8 (11.01.23), #9 (03.11.24), #10 (07.09.24), #11 (08.21.24), #12 (10.02.24), #13 (11.13.24), #14 (12.18.24) -- IVE resistance ============================ - s/p IVV OD #1 (01.22.25), #2 (02.26.25), #3 (04.02.25), #4 (05.07.25), #5 (06.18.25), #6 (07.30.25), #7 (09.10.25), #8 (10.29.25) **h/o increased SRF at 7 weeks  on 10.29.25 IVV OD**  **h/o increased fluid at 6 wks on 11.13.24 IVE OD**  - BCVA OD 20/20 from 20/25  - OCT OD: Interval resolution of shallow SRF centrally overlying stable low PED, no IRF at 6 wks - recommend IVV OD #9 today, 12.10.25 w/ f/u in 6 wks  - pt in agreement  - RBA of procedure discussed, questions answered - see procedure note - IVV informed consent obtained and signed, 01.25.25 (OU)  - f/u 6 weeks -- DFE/OCT, possible injxns  3,4.  Hypertensive retinopathy OU - discussed importance of tight BP control - monitor     5. Pseudophakia OU  - s/p CE/IOL OU  - IOLs in good position, doing well  - monitor   Ophthalmic Meds Ordered this visit:  Meds ordered this encounter  Medications   faricimab -svoa (VABYSMO ) 6mg /0.39mL intravitreal injection   faricimab -svoa (VABYSMO ) 6mg /0.28mL intravitreal injection     Return in about 6 weeks (around 12/01/2024) for f/u, Ex. AMD, DFE, OCT, Possible, IVV, OU.  There are no Patient Instructions on file for this visit.  This document serves as a record of services personally performed by Redell JUDITHANN Hans, MD, PhD. It was created on their behalf by Almetta Pesa, an ophthalmic technician. The creation of this record is the provider's dictation and/or activities during the visit.    Electronically signed by: Almetta Pesa, OA, 10/20/24  8:39 PM  This document serves as a record of services personally performed by Redell JUDITHANN Hans, MD, PhD. It was created on their behalf by Wanda GEANNIE Keens, COT an ophthalmic technician. The creation of this record is the provider's dictation and/or activities during the visit.    Electronically signed by:  Wanda GEANNIE Keens, COT  10/20/24 8:39 PM  Redell JUDITHANN Hans, M.D., Ph.D. Diseases & Surgery of the Retina and Vitreous [facility]  I have reviewed the above documentation for accuracy and completeness, and I agree with the above. Redell JUDITHANN Hans, M.D., Ph.D. 10/20/24 8:42 PM   Abbreviations: M myopia (nearsighted); A astigmatism; H hyperopia (farsighted); P presbyopia; Mrx spectacle prescription;  CTL contact lenses; OD right eye; OS left eye; OU both eyes  XT exotropia; ET esotropia; PEK punctate epithelial keratitis; PEE punctate epithelial erosions; DES dry eye syndrome; MGD meibomian gland dysfunction; ATs artificial tears; PFAT's preservative free artificial tears; NSC nuclear sclerotic cataract; PSC posterior  subcapsular cataract; ERM epi-retinal membrane; PVD posterior vitreous detachment; RD retinal detachment; DM diabetes mellitus; DR diabetic retinopathy; NPDR non-proliferative diabetic retinopathy; PDR proliferative diabetic retinopathy; CSME clinically significant macular edema; DME diabetic macular edema; dbh dot blot hemorrhages; CWS cotton wool spot; POAG primary open angle glaucoma; C/D cup-to-disc ratio; HVF humphrey visual field; GVF goldmann visual field; OCT optical coherence tomography; IOP intraocular pressure; BRVO Branch retinal vein occlusion; CRVO central retinal vein occlusion; CRAO central retinal artery occlusion; BRAO branch retinal artery occlusion; RT retinal tear; SB scleral buckle; PPV pars plana vitrectomy; VH Vitreous hemorrhage; PRP panretinal laser photocoagulation; IVK intravitreal kenalog ; VMT vitreomacular traction; MH Macular hole;  NVD neovascularization of the disc; NVE neovascularization elsewhere; AREDS age related eye disease study; ARMD age related macular degeneration; POAG primary open angle glaucoma; EBMD epithelial/anterior basement membrane dystrophy; ACIOL anterior chamber intraocular lens; IOL intraocular lens; PCIOL posterior chamber intraocular lens; Phaco/IOL phacoemulsification with intraocular lens placement; PRK photorefractive keratectomy; LASIK laser assisted in situ keratomileusis; HTN hypertension; DM diabetes mellitus; COPD chronic obstructive pulmonary disease

## 2024-10-14 ENCOUNTER — Other Ambulatory Visit: Payer: Self-pay

## 2024-10-18 ENCOUNTER — Inpatient Hospital Stay: Attending: Hematology and Oncology

## 2024-10-18 ENCOUNTER — Inpatient Hospital Stay: Admitting: Hematology and Oncology

## 2024-10-19 ENCOUNTER — Other Ambulatory Visit: Payer: Self-pay

## 2024-10-20 ENCOUNTER — Ambulatory Visit (INDEPENDENT_AMBULATORY_CARE_PROVIDER_SITE_OTHER): Admitting: Ophthalmology

## 2024-10-20 ENCOUNTER — Encounter (INDEPENDENT_AMBULATORY_CARE_PROVIDER_SITE_OTHER): Payer: Self-pay | Admitting: Ophthalmology

## 2024-10-20 DIAGNOSIS — H353221 Exudative age-related macular degeneration, left eye, with active choroidal neovascularization: Secondary | ICD-10-CM

## 2024-10-20 DIAGNOSIS — I10 Essential (primary) hypertension: Secondary | ICD-10-CM | POA: Diagnosis not present

## 2024-10-20 DIAGNOSIS — Z961 Presence of intraocular lens: Secondary | ICD-10-CM

## 2024-10-20 DIAGNOSIS — H353231 Exudative age-related macular degeneration, bilateral, with active choroidal neovascularization: Secondary | ICD-10-CM

## 2024-10-20 DIAGNOSIS — H35033 Hypertensive retinopathy, bilateral: Secondary | ICD-10-CM | POA: Diagnosis not present

## 2024-10-20 DIAGNOSIS — H353211 Exudative age-related macular degeneration, right eye, with active choroidal neovascularization: Secondary | ICD-10-CM

## 2024-10-20 MED ORDER — FARICIMAB-SVOA 6 MG/0.05ML IZ SOSY
6.0000 mg | PREFILLED_SYRINGE | INTRAVITREAL | Status: AC | PRN
  Administered 2024-10-20: 6 mg via INTRAVITREAL

## 2024-10-21 ENCOUNTER — Encounter: Payer: Self-pay | Admitting: Hematology and Oncology

## 2024-10-21 ENCOUNTER — Other Ambulatory Visit: Payer: Self-pay

## 2024-10-21 ENCOUNTER — Inpatient Hospital Stay: Admitting: Hematology and Oncology

## 2024-10-21 ENCOUNTER — Inpatient Hospital Stay: Attending: Hematology and Oncology

## 2024-10-21 VITALS — BP 139/67 | HR 84 | Temp 97.7°F | Resp 18 | Ht 62.0 in | Wt 141.2 lb

## 2024-10-21 DIAGNOSIS — C9110 Chronic lymphocytic leukemia of B-cell type not having achieved remission: Secondary | ICD-10-CM

## 2024-10-21 DIAGNOSIS — Z79620 Long term (current) use of immunosuppressive biologic: Secondary | ICD-10-CM | POA: Diagnosis not present

## 2024-10-21 DIAGNOSIS — C9112 Chronic lymphocytic leukemia of B-cell type in relapse: Secondary | ICD-10-CM | POA: Diagnosis present

## 2024-10-21 LAB — CBC WITH DIFFERENTIAL/PLATELET
Abs Immature Granulocytes: 0.07 K/uL (ref 0.00–0.07)
Basophils Absolute: 0.1 K/uL (ref 0.0–0.1)
Basophils Relative: 1 %
Eosinophils Absolute: 0.1 K/uL (ref 0.0–0.5)
Eosinophils Relative: 1 %
HCT: 37.9 % (ref 36.0–46.0)
Hemoglobin: 12.3 g/dL (ref 12.0–15.0)
Immature Granulocytes: 1 %
Lymphocytes Relative: 44 %
Lymphs Abs: 5 K/uL — ABNORMAL HIGH (ref 0.7–4.0)
MCH: 27.2 pg (ref 26.0–34.0)
MCHC: 32.5 g/dL (ref 30.0–36.0)
MCV: 83.8 fL (ref 80.0–100.0)
Monocytes Absolute: 0.6 K/uL (ref 0.1–1.0)
Monocytes Relative: 6 %
Neutro Abs: 5.4 K/uL (ref 1.7–7.7)
Neutrophils Relative %: 47 %
Platelets: 210 K/uL (ref 150–400)
RBC: 4.52 MIL/uL (ref 3.87–5.11)
RDW: 14.6 % (ref 11.5–15.5)
WBC: 11.3 K/uL — ABNORMAL HIGH (ref 4.0–10.5)
nRBC: 0 % (ref 0.0–0.2)

## 2024-10-21 LAB — COMPREHENSIVE METABOLIC PANEL WITH GFR
ALT: 15 U/L (ref 0–44)
AST: 21 U/L (ref 15–41)
Albumin: 4.2 g/dL (ref 3.5–5.0)
Alkaline Phosphatase: 70 U/L (ref 38–126)
Anion gap: 9 (ref 5–15)
BUN: 19 mg/dL (ref 8–23)
CO2: 28 mmol/L (ref 22–32)
Calcium: 9.8 mg/dL (ref 8.9–10.3)
Chloride: 103 mmol/L (ref 98–111)
Creatinine, Ser: 0.79 mg/dL (ref 0.44–1.00)
GFR, Estimated: >60 mL/min (ref >60)
Glucose, Bld: 99 mg/dL (ref 70–99)
Potassium: 3.6 mmol/L (ref 3.5–5.1)
Sodium: 140 mmol/L (ref 135–145)
Total Bilirubin: 0.5 mg/dL (ref 0.0–1.2)
Total Protein: 6.8 g/dL (ref 6.5–8.1)

## 2024-10-21 NOTE — Progress Notes (Signed)
 [facility] OFFICE PROGRESS NOTE  Patient Care Team: Aisha Harvey, MD as PCP - General (Family Medicine) O'Neal, Darryle Ned, MD as PCP - Cardiology (Cardiology) Watt Rush, MD as Attending Physician (Urology) Blanca Elsie RAMAN, MD as Attending Physician (Cardiology) Lonn Hicks, MD as Consulting Physician (Hematology and Oncology)  Assessment & Plan CLL (chronic lymphocytic leukemia) [facility]) She was diagnosed with chronic lymphocytic leukemia since April 2014 FISH positive for deletion 13 q.  She was treated with Bendamustine  and rituximab  between October 2018 to January 2019 but relapsed in 2014 She has been on maintenance Calquence  since May 2024 CT imaging from July 2025 showed complete response to therapy The plan will be to continue maintenance Calquence  for at least 2 years from now before discontinuation She was off treatment recently due to knee surgery, and was able to resume without problems Repeat CBC today show borderline lymphocytosis but overall white blood cell count is trending down She will continue Calquence  indefinitely The plan is to repeat imaging study again next year She will continue acyclovir  for antimicrobial prophylaxis   No orders of the defined types were placed in this encounter.    Hicks Lonn, MD  INTERVAL HISTORY: she returns for treatment follow-up Complications related to previous cycle of chemotherapy included none  PHYSICAL EXAMINATION: ECOG PERFORMANCE STATUS: 0 - Asymptomatic  No results found for: CAN125    Latest Ref Rng & Units 10/21/2024    9:28 AM 09/06/2024   10:28 AM 08/31/2024    3:44 AM  CBC  WBC 4.0 - 10.5 K/uL 11.3  17.1  15.2   Hemoglobin 12.0 - 15.0 g/dL 87.6  88.1  88.8   Hematocrit 36.0 - 46.0 % 37.9  35.5  34.2   Platelets 150 - 400 K/uL 210  302  160       Chemistry      Component Value Date/Time   NA 140 10/21/2024 0928   NA 141 11/13/2017 0753   K 3.6 10/21/2024 0928   K 4.1 11/13/2017  0753   CL 103 10/21/2024 0928   CL 104 02/08/2013 1454   CO2 28 10/21/2024 0928   CO2 27 11/13/2017 0753   BUN 19 10/21/2024 0928   BUN 11.6 11/13/2017 0753   CREATININE 0.79 10/21/2024 0928   CREATININE 0.84 04/10/2023 1128   CREATININE 0.8 11/13/2017 0753      Component Value Date/Time   CALCIUM  9.8 10/21/2024 0928   CALCIUM  9.3 11/13/2017 0753   ALKPHOS 70 10/21/2024 0928   ALKPHOS 83 11/13/2017 0753   AST 21 10/21/2024 0928   AST 16 04/10/2023 1128   AST 24 11/13/2017 0753   ALT 15 10/21/2024 0928   ALT 14 04/10/2023 1128   ALT 24 11/13/2017 0753   BILITOT 0.5 10/21/2024 0928   BILITOT 0.7 04/10/2023 1128   BILITOT 0.38 11/13/2017 0753       Vitals:   10/21/24 0955  BP: 139/67  Pulse: 84  Resp: 18  Temp: 97.7 F (36.5 C)  SpO2: 97%   Filed Weights   10/21/24 0955  Weight: 141 lb 3.2 oz (64 kg)   Other relevant data reviewed during this visit included CBC and CMP

## 2024-10-21 NOTE — Progress Notes (Signed)
 Specialty Pharmacy Refill Coordination Note  Amanda [name] is a 78 y.o. female contacted today regarding refills of specialty medication(s) Acalabrutinib  Maleate (Calquence )   Patient requested Delivery   Delivery date: 10/28/24   Verified address: [address]  [name] [address]   Medication will be filled on: 10/28/24

## 2024-10-21 NOTE — Assessment & Plan Note (Addendum)
 She was diagnosed with chronic lymphocytic leukemia since April 2014 FISH positive for deletion 13 q.  She was treated with Bendamustine  and rituximab  between October 2018 to January 2019 but relapsed in 2014 She has been on maintenance Calquence  since May 2024 CT imaging from July 2025 showed complete response to therapy The plan will be to continue maintenance Calquence  for at least 2 years from now before discontinuation She was off treatment recently due to knee surgery, and was able to resume without problems Repeat CBC today show borderline lymphocytosis but overall white blood cell count is trending down She will continue Calquence  indefinitely The plan is to repeat imaging study again next year She will continue acyclovir  for antimicrobial prophylaxis

## 2024-10-21 NOTE — Progress Notes (Signed)
 Specialty Pharmacy Ongoing Clinical Assessment Note  Amanda [name] is a 78 y.o. female who is being followed by the specialty pharmacy service for RxSp Oncology   Patient's specialty medication(s) reviewed today: Acalabrutinib  Maleate (Calquence )   Missed doses in the last 4 weeks: 0   Patient/Caregiver did not have any additional questions or concerns.   Therapeutic benefit summary: Patient is achieving benefit   Adverse events/side effects summary: No adverse events/side effects   Patient's therapy is appropriate to: Continue    Goals Addressed             This Visit's Progress    Slow Disease Progression   On track    Patient is on track. Patient will maintain adherence. Per provider notes CT imaging from July 2025 showed complete response to therapy.        Follow up: 6 months  Silvano LOISE Dolly Specialty Pharmacist

## 2024-10-22 DIAGNOSIS — Z5189 Encounter for other specified aftercare: Secondary | ICD-10-CM | POA: Diagnosis not present

## 2024-10-28 ENCOUNTER — Other Ambulatory Visit: Payer: Self-pay

## 2024-11-02 ENCOUNTER — Other Ambulatory Visit (HOSPITAL_COMMUNITY): Payer: Self-pay

## 2024-11-03 ENCOUNTER — Other Ambulatory Visit (HOSPITAL_COMMUNITY): Payer: Self-pay

## 2024-11-19 ENCOUNTER — Other Ambulatory Visit: Payer: Self-pay

## 2024-11-22 ENCOUNTER — Other Ambulatory Visit: Payer: Self-pay

## 2024-11-24 ENCOUNTER — Other Ambulatory Visit: Payer: Self-pay

## 2024-11-24 NOTE — Progress Notes (Signed)
 Specialty Pharmacy Refill Coordination Note  Amanda [name] is a 79 y.o. female contacted today regarding refills of specialty medication(s) Acalabrutinib  Maleate (Calquence )   Patient requested Delivery   Delivery date: 12/01/24   Verified address: [address]  [place] [address]   Medication will be filled on: 11/30/24

## 2024-11-29 NOTE — Progress Notes (Signed)
 " [facility] - Clinic Note  12/01/2024    CHIEF COMPLAINT Patient presents for Retina Follow Up  HISTORY OF PRESENT ILLNESS: Amanda [name] is a 79 y.o. female who presents to the clinic today for:  HPI     Retina Follow Up   Patient presents with  Wet AMD.  In both eyes.  This started 6 weeks ago.  Severity is moderate.  Since onset it is stable.  I, the attending physician,  performed the HPI with the patient and updated documentation appropriately.        Comments   Pt denies any changes or concerns with vision.       Last edited by Valdemar Rogue, MD on 12/01/2024 11:54 PM.    Patient states her vision is the same, no issues.   Referring physician: Clem Brands, PA-C [facility] [address] [place],  [place] [address]  HISTORICAL INFORMATION:  Selected notes from the MEDICAL RECORD NUMBER Referred by Clem Brands, PA on 11.16.21 for eval of dry to wet AMD conversion OS.   CURRENT MEDICATIONS: Current Outpatient Medications (Ophthalmic Drugs)  Medication Sig   hydroxypropyl methylcellulose / hypromellose (ISOPTO TEARS / GONIOVISC) 2.5 % ophthalmic solution Place 1 drop into both eyes as needed for dry eyes.   No current facility-administered medications for this visit. (Ophthalmic Drugs)   Current Outpatient Medications (Other)  Medication Sig   acalabrutinib  maleate (CALQUENCE ) 100 MG tablet Take 1 tablet (100 mg total) by mouth 2 (two) times daily. (Patient not taking: Reported on 10/20/2024)   acetaminophen  (TYLENOL ) 500 MG tablet Take 500-1,000 mg by mouth every 6 (six) hours as needed for moderate pain (pain score 4-6).   acyclovir  (ZOVIRAX ) 400 MG tablet Take 1 tablet (400 mg total) by mouth daily.   atorvastatin  (LIPITOR) 40 MG tablet Take 40 mg by mouth daily.   fluticasone  (FLONASE ) 50 MCG/ACT nasal spray Place 2 sprays into both nostrils daily.   hydrochlorothiazide  (HYDRODIURIL ) 25 MG tablet Take 25 mg by  mouth daily.   methocarbamol  (ROBAXIN ) 500 MG tablet Take 1 tablet (500 mg total) by mouth every 6 (six) hours as needed for muscle spasm.   ondansetron  (ZOFRAN ) 4 MG tablet Take 1 tablet (4 mg total) by mouth every 6 (six) hours as needed for nausea.   potassium chloride  SA (K-DUR,KLOR-CON ) 20 MEQ tablet Take 20 mEq by mouth every Monday, Wednesday, and Friday.   No current facility-administered medications for this visit. (Other)   REVIEW OF SYSTEMS: ROS   Positive for: Cardiovascular, Eyes Negative for: Constitutional, Gastrointestinal, Neurological, Skin, Genitourinary, Musculoskeletal, HENT, Endocrine, Respiratory, Psychiatric, Allergic/Imm, Heme/Lymph Last edited by Elnor Avelina RAMAN, COT on 12/01/2024  1:46 PM.       ALLERGIES Allergies  Allergen Reactions   Codeine Nausea And Vomiting   Demerol [Meperidine] Nausea And Vomiting   Morphine And Codeine Nausea And Vomiting   Ciprofloxacin Rash   Sulfa Antibiotics Rash   PAST MEDICAL HISTORY Past Medical History:  Diagnosis Date   Adenopathy    Arthritis    Atrial fibrillation (HCC)    CLL (chronic lymphocytic leukemia) (HCC) 08/31/2013   Dysrhythmia    hx of atrial fib none in 10 years per pt   Heart murmur    HTN (hypertension)    taken off of meds 06/2024 due to blood pressure low per pt   Hyperlipidemia    Hypertensive retinopathy    Lymphocytosis    Macular degeneration  OU   PONV (postoperative nausea and vomiting)    Past Surgical History:  Procedure Laterality Date   ABDOMINAL HYSTERECTOMY     endometriosis, fibroid   APPENDECTOMY     BREAST BIOPSY     benign cyst.   CATARACT EXTRACTION Bilateral 2019   CHOLECYSTECTOMY     COLONOSCOPY  11/2012   Per Dr. Donnald.  neg.    EYE SURGERY     IR FLUORO GUIDE PORT INSERTION RIGHT  08/12/2017   IR REMOVAL TUN ACCESS W/ PORT W/O FL MOD SED  01/29/2018   IR US  GUIDE VASC ACCESS RIGHT  08/12/2017   SQUAMOUS CELL CARCINOMA EXCISION  12/28/2020   Excised from  top of head   THUMB ARTHROSCOPY     TOTAL HIP ARTHROPLASTY  2010   TOTAL KNEE ARTHROPLASTY Left 08/30/2024   Procedure: ARTHROPLASTY, KNEE, TOTAL;  Surgeon: Melodi Lerner, MD;  Location: [facility];  Service: Orthopedics;  Laterality: Left;   FAMILY HISTORY Family History  Problem Relation Age of Onset   Renal Disease Mother    Cancer Mother        colon cancer   Cancer Father        colon cancer   SOCIAL HISTORY Social History   Tobacco Use   Smoking status: Former    Types: Cigarettes   Smokeless tobacco: Never  Vaping Use   Vaping status: Never Used  Substance Use Topics   Alcohol  use: Yes    Comment: occ glass of wine   Drug use: No       OPHTHALMIC EXAM: Base Eye Exam     Visual Acuity (Snellen - Linear)       Right Left   Dist cc 20/20 -2 20/20 -2    Correction: Glasses         Tonometry (Tonopen, 1:51 PM)       Right Left   Pressure 15 13         Pupils       Pupils Dark Light Shape React APD   Right PERRL 3 2 Round Brisk None   Left PERRL 3 2 Round Brisk None         Visual Fields       Left Right    Full Full         Extraocular Movement       Right Left    Full, Ortho Full, Ortho         Neuro/Psych     Oriented x3: Yes   Mood/Affect: Normal         Dilation     Both eyes: 1.0% Mydriacyl, 2.5% Phenylephrine @ 1:52 PM           Slit Lamp and Fundus Exam     Slit Lamp Exam       Right Left   Lids/Lashes Dermatochalasis - upper lid, mild Meibomian gland dysfunction Dermatochalasis - upper lid, mild Meibomian gland dysfunction   Conjunctiva/Sclera White and quiet White and quiet   Cornea Trace Debris in tear film, well healed temporal cataract wounds, 1+ pigmented Guttata, trace PEE well healed temporal cataract wounds, 2+ Punctate epithelial erosions   Anterior Chamber Deep and quiet Deep and quiet   Iris Round and dilated Round and dilated   Lens PC IOL in good position, trace PCO PC IOL in good position    Anterior Vitreous Trace Vitreous syneresis, Posterior vitreous detachment Trace Vitreous syneresis, Posterior vitreous detachment  Fundus Exam       Right Left   Disc Compact, trace pallor, Sharp rim, mild PPA Compact, Pink and Sharp, temporal PPP   C/D Ratio 0.1 0.2   Macula Blunted foveal reflex, Drusen, RPE mottling, clumping and early atrophy, shallow PED with trace SRF- stably improved Blunted foveal reflex, +CNV, Drusen, RPE mottling and clumping; trace shallow SRF--stably improved, no heme or edema   Vessels attenuated, Tortuous attenuated, Tortuous   Periphery Attached; mild reticular degeneration, no heme Attached, reticular degeneration, No heme           Refraction     Wearing Rx       Sphere Cylinder Axis Add   Right -0.25 +0.75 165 +2.50   Left -0.50 +0.50 012 +2.50    Type: PAL           IMAGING AND PROCEDURES  Imaging and Procedures for 12/01/2024  OCT, Retina - OU - Both Eyes       Right Eye Quality was good. Central Foveal Thickness: 252. Progression has been stable. Findings include normal foveal contour, no IRF, no SRF, retinal drusen , subretinal hyper-reflective material, intraretinal hyper-reflective material, pigment epithelial detachment, subretinal fluid, outer retinal atrophy (Stable resolution of shallow SRF centrally overlying stable low PED, no IRF).   Left Eye Quality was good. Central Foveal Thickness: 325. Progression has been stable. Findings include no IRF, no SRF, abnormal foveal contour, retinal drusen , subretinal hyper-reflective material, choroidal neovascular membrane, pigment epithelial detachment, outer retinal atrophy (Stable improvement in shallow SRF overlying PED ).   Notes *Images captured and stored on drive  Diagnosis / Impression:  OD: exu ARMD -- stable resolution of shallow SRF centrally overlying stable low PED, no IRF OS: exu ARMD -- Stable improvement in shallow SRF overlying PED   Clinical management:   See below  Abbreviations: NFP - Normal foveal profile. CME - cystoid macular edema. PED - pigment epithelial detachment. IRF - intraretinal fluid. SRF - subretinal fluid. EZ - ellipsoid zone. ERM - epiretinal membrane. ORA - outer retinal atrophy. ORT - outer retinal tubulation. SRHM - subretinal hyper-reflective material. IRHM - intraretinal hyper-reflective material      Intravitreal Injection, Pharmacologic Agent - OD - Right Eye       Time Out 12/01/2024. 2:31 PM. Confirmed correct patient, procedure, site, and patient consented.   Anesthesia Topical anesthesia was used. Anesthetic medications included Lidocaine  2%, Proparacaine 0.5%.   Procedure Preparation included 5% betadine  to ocular surface, eyelid speculum. A supplied (32g) needle was used.   Injection: 6 mg faricimab -svoa 6 MG/0.05ML   Route: Intravitreal, Site: Right Eye   NDC: 49757-903-93, Lot: [id number], Expiration date: 08/10/2025, Waste: 0 mL   Post-op Post injection exam found visual acuity of at least counting fingers. The patient tolerated the procedure well. There were no complications. The patient received written and verbal post procedure care education. Post injection medications were not given.      Intravitreal Injection, Pharmacologic Agent - OS - Left Eye       Time Out 12/01/2024. 2:32 PM. Confirmed correct patient, procedure, site, and patient consented.   Anesthesia Topical anesthesia was used. Anesthetic medications included Lidocaine  2%, Proparacaine 0.5%.   Procedure Preparation included 5% betadine  to ocular surface, eyelid speculum. A supplied (32g) needle was used.   Injection: 6 mg faricimab -svoa 6 MG/0.05ML   Route: Intravitreal, Site: Left Eye   NDC: 49757-903-93, Lot: [id number], Expiration date: 11/10/2025, Waste: 0 mL   Post-op Post injection exam found  visual acuity of at least counting fingers. The patient tolerated the procedure well. There were no complications. The patient  received written and verbal post procedure care education.            ASSESSMENT/PLAN:    ICD-10-CM   1. Exudative age-related macular degeneration of left eye with active choroidal neovascularization (HCC)  H35.3221 OCT, Retina - OU - Both Eyes    Intravitreal Injection, Pharmacologic Agent - OS - Left Eye    faricimab -svoa (VABYSMO ) 6mg /0.11mL intravitreal injection    2. Exudative age-related macular degeneration of right eye with active choroidal neovascularization (HCC)  H35.3211 Intravitreal Injection, Pharmacologic Agent - OD - Right Eye    faricimab -svoa (VABYSMO ) 6mg /0.35mL intravitreal injection    3. Essential hypertension  I10     4. Hypertensive retinopathy of both eyes  H35.033     5. Pseudophakia, both eyes  Z96.1      1. Exudative age related macular degeneration, OS **h/o increased fluid at 6 wks on 11.13.24 and 07.30.25**  - [facility] pt with known history of nonexudative ARMD OU - acute haze over vision OS -- onset, Friday 11.12.21 -- initial exam with central subretinal heme - s/p IVA OS #1 (11.17.21), #2 (12.15.21), #3 (01.12.22), #4 (02.09.22), #5 (03.16.22), #6 (04.20.22), #7 (06.01.22), #8 (06.29.22)  -- IVA resistance ========================= - s/p IVE OS #1 (07.27.22), #2 (08.24.22), #3 (09.21.22), #4 (10.19.22), #5 (11.16.22), #6 (12.16.22), #7 (01.18.23), #8 (02.15.23)  -- IVE resistance ========================= - s/p IVV OS #1 SAMPLE (03.15.23), #2 (04.12.23), #3 (05.10.23), #4 (06.07.23), #5 (07.19.23), #6 (08.22.23), #7 (09.27.23), #8 (12.20.23), #9 (01.31.24), #10 (03.13.24), #11 (04.24.24), #12 (05.31.24), #13 (07.09.24), #14 (08.21.24), #15 (10.02.24), #16 (11.13.24), #17 (12.18.24), #18 (01.22.25), #19 (02.26.25), #20 (04.02.25), #21 (05.07.25), #22 (06.18.25), #23 (07.30.25), #24 (09.10.25), #25 (10.29.25), #26 (12.10.25) - BCVA OS 20/20 - stable - OCT show OS: Stable improvement in shallow SRF overlying PED at 6 wks - recommend IVV OS  #27 today, 12.10.25 with follow up ext to 7 weeks - pt wishes to be treated with IVV OS - RBA of procedure discussed, questions answered - IVV informed consent obtained and signed 01.22.25 (OS) - see procedure note   - f/u 7 weeks -- DFE/OCT, possible injections  2. Exudative age related macular degeneration OD  - conversion to exudative ARMD noted on 03.15.23 exam  - exam with new SRH temporal macula -- stably improved today **h/o increased SRF at 7 weeks on 10.29.25 IVV OD**  **h/o increased fluid at 6 wks on 11.13.24 IVE OD**  - BCVA OD 20/20 stable - s/p IVE OD #1 (03.15.23), #2 (04.12.23), #3 (05.10.23), #4 (06.07.23), #5 (07.19.23), #6 (08.19.23), #7 (09.27.23), #8 (11.01.23), #9 (03.11.24), #10 (07.09.24), #11 (08.21.24), #12 (10.02.24), #13 (11.13.24), #14 (12.18.24) -- IVE resistance ============================ - s/p IVV OD #1 (01.22.25), #2 (02.26.25), #3 (04.02.25), #4 (05.07.25), #5 (06.18.25), #6 (07.30.25), #7 (09.10.25), #8 (10.29.25),  #9 (12.10.25) - OCT OD: stable resolution of shallow SRF centrally overlying stable low PED, no IRF at 6 wks - recommend IVV OD #10 today, 12.10.25 w/ f/u ext to 7 wks  - pt in agreement  - RBA of procedure discussed, questions answered - see procedure note - IVV informed consent obtained and signed, 01.25.25 (OU)  - f/u 7 weeks -- DFE/OCT, possible injxns  3,4. Hypertensive retinopathy OU - discussed importance of tight BP control - monitor     5. Pseudophakia OU  - s/p CE/IOL OU  - IOLs in good position,  doing well  - monitor   Ophthalmic Meds Ordered this visit:  Meds ordered this encounter  Medications   faricimab -svoa (VABYSMO ) 6mg /0.51mL intravitreal injection   faricimab -svoa (VABYSMO ) 6mg /0.43mL intravitreal injection     Return in about 7 weeks (around 01/19/2025) for f/u, Ex. AMD, DFE, OCT, Possible, IVV, OU.  There are no Patient Instructions on file for this visit.  This document serves as a record of services  personally performed by Redell JUDITHANN Hans, MD, PhD. It was created on their behalf by Almetta Pesa, an ophthalmic technician. The creation of this record is the provider's dictation and/or activities during the visit.    Electronically signed by: Almetta Pesa, OA, 12/09/24  4:22 PM  This document serves as a record of services personally performed by Redell JUDITHANN Hans, MD, PhD. It was created on their behalf by Wanda GEANNIE Keens, COT an ophthalmic technician. The creation of this record is the provider's dictation and/or activities during the visit.    Electronically signed by:  Wanda GEANNIE Keens, COT  12/09/24 4:22 PM  Redell JUDITHANN Hans, M.D., Ph.D. Diseases & Surgery of the Retina and Vitreous [facility]  I have reviewed the above documentation for accuracy and completeness, and I agree with the above. Redell JUDITHANN Hans, M.D., Ph.D. 12/09/24 4:24 PM   Abbreviations: M myopia (nearsighted); A astigmatism; H hyperopia (farsighted); P presbyopia; Mrx spectacle prescription;  CTL contact lenses; OD right eye; OS left eye; OU both eyes  XT exotropia; ET esotropia; PEK punctate epithelial keratitis; PEE punctate epithelial erosions; DES dry eye syndrome; MGD meibomian gland dysfunction; ATs artificial tears; PFAT's preservative free artificial tears; NSC nuclear sclerotic cataract; PSC posterior subcapsular cataract; ERM epi-retinal membrane; PVD posterior vitreous detachment; RD retinal detachment; DM diabetes mellitus; DR diabetic retinopathy; NPDR non-proliferative diabetic retinopathy; PDR proliferative diabetic retinopathy; CSME clinically significant macular edema; DME diabetic macular edema; dbh dot blot hemorrhages; CWS cotton wool spot; POAG primary open angle glaucoma; C/D cup-to-disc ratio; HVF humphrey visual field; GVF goldmann visual field; OCT optical coherence tomography; IOP intraocular pressure; BRVO Branch retinal vein occlusion; CRVO central retinal vein  occlusion; CRAO central retinal artery occlusion; BRAO branch retinal artery occlusion; RT retinal tear; SB scleral buckle; PPV pars plana vitrectomy; VH Vitreous hemorrhage; PRP panretinal laser photocoagulation; IVK intravitreal kenalog ; VMT vitreomacular traction; MH Macular hole;  NVD neovascularization of the disc; NVE neovascularization elsewhere; AREDS age related eye disease study; ARMD age related macular degeneration; POAG primary open angle glaucoma; EBMD epithelial/anterior basement membrane dystrophy; ACIOL anterior chamber intraocular lens; IOL intraocular lens; PCIOL posterior chamber intraocular lens; Phaco/IOL phacoemulsification with intraocular lens placement; PRK photorefractive keratectomy; LASIK laser assisted in situ keratomileusis; HTN hypertension; DM diabetes mellitus; COPD chronic obstructive pulmonary disease "

## 2024-11-30 ENCOUNTER — Other Ambulatory Visit: Payer: Self-pay

## 2024-12-01 ENCOUNTER — Ambulatory Visit (INDEPENDENT_AMBULATORY_CARE_PROVIDER_SITE_OTHER): Admitting: Ophthalmology

## 2024-12-01 ENCOUNTER — Encounter (INDEPENDENT_AMBULATORY_CARE_PROVIDER_SITE_OTHER): Payer: Self-pay | Admitting: Ophthalmology

## 2024-12-01 DIAGNOSIS — H35033 Hypertensive retinopathy, bilateral: Secondary | ICD-10-CM | POA: Diagnosis not present

## 2024-12-01 DIAGNOSIS — H353231 Exudative age-related macular degeneration, bilateral, with active choroidal neovascularization: Secondary | ICD-10-CM

## 2024-12-01 DIAGNOSIS — I10 Essential (primary) hypertension: Secondary | ICD-10-CM

## 2024-12-01 DIAGNOSIS — H353221 Exudative age-related macular degeneration, left eye, with active choroidal neovascularization: Secondary | ICD-10-CM

## 2024-12-01 DIAGNOSIS — Z961 Presence of intraocular lens: Secondary | ICD-10-CM

## 2024-12-01 DIAGNOSIS — H353211 Exudative age-related macular degeneration, right eye, with active choroidal neovascularization: Secondary | ICD-10-CM

## 2024-12-01 MED ORDER — FARICIMAB-SVOA 6 MG/0.05ML IZ SOSY
6.0000 mg | PREFILLED_SYRINGE | INTRAVITREAL | Status: AC | PRN
  Administered 2024-12-01: 6 mg via INTRAVITREAL

## 2024-12-13 ENCOUNTER — Other Ambulatory Visit (HOSPITAL_COMMUNITY): Payer: Self-pay

## 2024-12-13 ENCOUNTER — Other Ambulatory Visit: Payer: Self-pay | Admitting: Hematology and Oncology

## 2024-12-14 ENCOUNTER — Other Ambulatory Visit (HOSPITAL_COMMUNITY): Payer: Self-pay

## 2024-12-14 ENCOUNTER — Other Ambulatory Visit: Payer: Self-pay

## 2024-12-14 MED ORDER — ACYCLOVIR 400 MG PO TABS
400.0000 mg | ORAL_TABLET | Freq: Every day | ORAL | 6 refills | Status: AC
  Filled 2024-12-14: qty 30, 30d supply, fill #0

## 2025-01-19 ENCOUNTER — Encounter (INDEPENDENT_AMBULATORY_CARE_PROVIDER_SITE_OTHER): Admitting: Ophthalmology

## 2025-01-20 ENCOUNTER — Inpatient Hospital Stay: Admitting: Hematology and Oncology

## 2025-01-20 ENCOUNTER — Inpatient Hospital Stay
